# Patient Record
Sex: Male | Born: 1950 | Race: White | Hispanic: No | Marital: Married | Smoking: Former smoker
Health system: Southern US, Community
[De-identification: ages and names within clinical notes are randomized; demographics above are authoritative.]

## PROBLEM LIST (undated history)

## (undated) DIAGNOSIS — D500 Iron deficiency anemia secondary to blood loss (chronic): Principal | ICD-10-CM

## (undated) DIAGNOSIS — IMO0002 Reserved for concepts with insufficient information to code with codable children: Secondary | ICD-10-CM

## (undated) DIAGNOSIS — K5790 Diverticulosis of intestine, part unspecified, without perforation or abscess without bleeding: Secondary | ICD-10-CM

## (undated) DIAGNOSIS — R0989 Other specified symptoms and signs involving the circulatory and respiratory systems: Secondary | ICD-10-CM

## (undated) DIAGNOSIS — K648 Other hemorrhoids: Secondary | ICD-10-CM

## (undated) DIAGNOSIS — K219 Gastro-esophageal reflux disease without esophagitis: Secondary | ICD-10-CM

## (undated) DIAGNOSIS — K5521 Angiodysplasia of colon with hemorrhage: Secondary | ICD-10-CM

## (undated) DIAGNOSIS — F419 Anxiety disorder, unspecified: Secondary | ICD-10-CM

## (undated) DIAGNOSIS — F17200 Nicotine dependence, unspecified, uncomplicated: Secondary | ICD-10-CM

## (undated) DIAGNOSIS — I10 Essential (primary) hypertension: Secondary | ICD-10-CM

## (undated) DIAGNOSIS — R0602 Shortness of breath: Secondary | ICD-10-CM

## (undated) DIAGNOSIS — L89159 Pressure ulcer of sacral region, unspecified stage: Secondary | ICD-10-CM

## (undated) DIAGNOSIS — Z5189 Encounter for other specified aftercare: Secondary | ICD-10-CM

## (undated) DIAGNOSIS — R011 Cardiac murmur, unspecified: Secondary | ICD-10-CM

## (undated) DIAGNOSIS — E119 Type 2 diabetes mellitus without complications: Secondary | ICD-10-CM

## (undated) DIAGNOSIS — R1310 Dysphagia, unspecified: Secondary | ICD-10-CM

## (undated) DIAGNOSIS — E785 Hyperlipidemia, unspecified: Secondary | ICD-10-CM

## (undated) DIAGNOSIS — N189 Chronic kidney disease, unspecified: Secondary | ICD-10-CM

## (undated) DIAGNOSIS — I509 Heart failure, unspecified: Secondary | ICD-10-CM

## (undated) DIAGNOSIS — M1990 Unspecified osteoarthritis, unspecified site: Secondary | ICD-10-CM

## (undated) DIAGNOSIS — I499 Cardiac arrhythmia, unspecified: Secondary | ICD-10-CM

## (undated) DIAGNOSIS — J189 Pneumonia, unspecified organism: Secondary | ICD-10-CM

## (undated) DIAGNOSIS — D649 Anemia, unspecified: Secondary | ICD-10-CM

## (undated) DIAGNOSIS — K2970 Gastritis, unspecified, without bleeding: Secondary | ICD-10-CM

## (undated) DIAGNOSIS — R918 Other nonspecific abnormal finding of lung field: Secondary | ICD-10-CM

## (undated) DIAGNOSIS — T451X5A Adverse effect of antineoplastic and immunosuppressive drugs, initial encounter: Principal | ICD-10-CM

## (undated) DIAGNOSIS — N185 Chronic kidney disease, stage 5: Secondary | ICD-10-CM

## (undated) DIAGNOSIS — K429 Umbilical hernia without obstruction or gangrene: Secondary | ICD-10-CM

## (undated) DIAGNOSIS — IMO0001 Reserved for inherently not codable concepts without codable children: Secondary | ICD-10-CM

## (undated) DIAGNOSIS — Z66 Do not resuscitate: Secondary | ICD-10-CM

## (undated) DIAGNOSIS — I739 Peripheral vascular disease, unspecified: Secondary | ICD-10-CM

## (undated) DIAGNOSIS — D6481 Anemia due to antineoplastic chemotherapy: Principal | ICD-10-CM

## (undated) DIAGNOSIS — I6529 Occlusion and stenosis of unspecified carotid artery: Secondary | ICD-10-CM

## (undated) HISTORY — DX: Iron deficiency anemia secondary to blood loss (chronic): D50.0

## (undated) HISTORY — PX: VASCULAR SURGERY: SHX849

## (undated) HISTORY — DX: Other hemorrhoids: K64.8

## (undated) HISTORY — PX: BELOW KNEE LEG AMPUTATION: SUR23

## (undated) HISTORY — DX: Diverticulosis of intestine, part unspecified, without perforation or abscess without bleeding: K57.90

## (undated) HISTORY — DX: Peripheral vascular disease, unspecified: I73.9

## (undated) HISTORY — DX: Do not resuscitate: Z66

## (undated) HISTORY — DX: Heart failure, unspecified: I50.9

## (undated) HISTORY — DX: Other specified symptoms and signs involving the circulatory and respiratory systems: R09.89

## (undated) HISTORY — DX: Cardiac murmur, unspecified: R01.1

## (undated) HISTORY — DX: Gastritis, unspecified, without bleeding: K29.70

## (undated) HISTORY — DX: Essential (primary) hypertension: I10

## (undated) HISTORY — DX: Type 2 diabetes mellitus without complications: E11.9

## (undated) HISTORY — DX: Adverse effect of antineoplastic and immunosuppressive drugs, initial encounter: T45.1X5A

## (undated) HISTORY — DX: Other nonspecific abnormal finding of lung field: R91.8

## (undated) HISTORY — DX: Chronic kidney disease, stage 5: N18.5

## (undated) HISTORY — DX: Anemia due to antineoplastic chemotherapy: D64.81

## (undated) HISTORY — DX: Reserved for concepts with insufficient information to code with codable children: IMO0002

## (undated) HISTORY — DX: Cardiac arrhythmia, unspecified: I49.9

## (undated) HISTORY — DX: Angiodysplasia of colon with hemorrhage: K55.21

## (undated) HISTORY — DX: Nicotine dependence, unspecified, uncomplicated: F17.200

## (undated) HISTORY — DX: Pressure ulcer of sacral region, unspecified stage: L89.159

## (undated) HISTORY — DX: Occlusion and stenosis of unspecified carotid artery: I65.29

## (undated) HISTORY — DX: Hyperlipidemia, unspecified: E78.5

---

## 1997-10-11 ENCOUNTER — Encounter: Admission: RE | Admit: 1997-10-11 | Discharge: 1997-10-11 | Payer: Self-pay | Admitting: Family Medicine

## 1998-09-08 ENCOUNTER — Encounter: Admission: RE | Admit: 1998-09-08 | Discharge: 1998-09-08 | Payer: Self-pay | Admitting: Sports Medicine

## 1998-09-11 ENCOUNTER — Encounter: Admission: RE | Admit: 1998-09-11 | Discharge: 1998-09-11 | Payer: Self-pay | Admitting: Family Medicine

## 1999-04-16 ENCOUNTER — Encounter: Payer: Self-pay | Admitting: Vascular Surgery

## 1999-04-17 ENCOUNTER — Ambulatory Visit: Admission: RE | Admit: 1999-04-17 | Discharge: 1999-04-17 | Payer: Self-pay | Admitting: Vascular Surgery

## 1999-05-08 ENCOUNTER — Ambulatory Visit (HOSPITAL_COMMUNITY): Admission: RE | Admit: 1999-05-08 | Discharge: 1999-05-08 | Payer: Self-pay | Admitting: Gastroenterology

## 2000-03-04 ENCOUNTER — Ambulatory Visit (HOSPITAL_COMMUNITY): Admission: RE | Admit: 2000-03-04 | Discharge: 2000-03-04 | Payer: Self-pay | Admitting: Gastroenterology

## 2000-03-04 ENCOUNTER — Encounter: Payer: Self-pay | Admitting: Gastroenterology

## 2000-03-18 ENCOUNTER — Encounter: Payer: Self-pay | Admitting: Gastroenterology

## 2000-03-18 ENCOUNTER — Ambulatory Visit (HOSPITAL_COMMUNITY): Admission: RE | Admit: 2000-03-18 | Discharge: 2000-03-18 | Payer: Self-pay | Admitting: Gastroenterology

## 2000-03-21 HISTORY — PX: ESOPHAGOGASTRODUODENOSCOPY: SHX1529

## 2000-04-05 ENCOUNTER — Ambulatory Visit (HOSPITAL_COMMUNITY): Admission: RE | Admit: 2000-04-05 | Discharge: 2000-04-05 | Payer: Self-pay | Admitting: Gastroenterology

## 2000-04-05 ENCOUNTER — Encounter (INDEPENDENT_AMBULATORY_CARE_PROVIDER_SITE_OTHER): Payer: Self-pay

## 2001-06-21 HISTORY — PX: OTHER SURGICAL HISTORY: SHX169

## 2001-07-21 ENCOUNTER — Encounter: Payer: Self-pay | Admitting: Vascular Surgery

## 2001-07-25 ENCOUNTER — Inpatient Hospital Stay (HOSPITAL_COMMUNITY): Admission: RE | Admit: 2001-07-25 | Discharge: 2001-07-26 | Payer: Self-pay | Admitting: Vascular Surgery

## 2001-07-25 ENCOUNTER — Encounter (INDEPENDENT_AMBULATORY_CARE_PROVIDER_SITE_OTHER): Payer: Self-pay | Admitting: *Deleted

## 2001-12-26 ENCOUNTER — Inpatient Hospital Stay (HOSPITAL_COMMUNITY): Admission: EM | Admit: 2001-12-26 | Discharge: 2002-01-03 | Payer: Self-pay | Admitting: Emergency Medicine

## 2001-12-26 ENCOUNTER — Encounter: Payer: Self-pay | Admitting: Emergency Medicine

## 2001-12-26 ENCOUNTER — Encounter (INDEPENDENT_AMBULATORY_CARE_PROVIDER_SITE_OTHER): Payer: Self-pay | Admitting: Specialist

## 2001-12-28 ENCOUNTER — Encounter: Payer: Self-pay | Admitting: Internal Medicine

## 2002-01-01 ENCOUNTER — Encounter: Payer: Self-pay | Admitting: Internal Medicine

## 2003-11-05 ENCOUNTER — Ambulatory Visit: Admission: RE | Admit: 2003-11-05 | Discharge: 2003-11-05 | Payer: Self-pay | Admitting: Family Medicine

## 2004-11-19 HISTORY — PX: COLONOSCOPY: SHX174

## 2004-11-20 ENCOUNTER — Ambulatory Visit (HOSPITAL_COMMUNITY): Admission: RE | Admit: 2004-11-20 | Discharge: 2004-11-20 | Payer: Self-pay | Admitting: Gastroenterology

## 2006-05-21 HISTORY — PX: ESOPHAGOGASTRODUODENOSCOPY: SHX1529

## 2006-06-07 ENCOUNTER — Inpatient Hospital Stay (HOSPITAL_COMMUNITY): Admission: EM | Admit: 2006-06-07 | Discharge: 2006-06-10 | Payer: Self-pay | Admitting: Emergency Medicine

## 2006-06-07 ENCOUNTER — Ambulatory Visit: Payer: Self-pay | Admitting: Cardiology

## 2006-06-08 ENCOUNTER — Encounter: Payer: Self-pay | Admitting: Cardiology

## 2006-08-09 ENCOUNTER — Emergency Department (HOSPITAL_COMMUNITY): Admission: EM | Admit: 2006-08-09 | Discharge: 2006-08-09 | Payer: Self-pay | Admitting: Emergency Medicine

## 2006-08-15 ENCOUNTER — Ambulatory Visit: Payer: Self-pay | Admitting: Vascular Surgery

## 2006-09-08 ENCOUNTER — Encounter (INDEPENDENT_AMBULATORY_CARE_PROVIDER_SITE_OTHER): Payer: Self-pay | Admitting: *Deleted

## 2006-09-08 ENCOUNTER — Ambulatory Visit (HOSPITAL_COMMUNITY): Admission: RE | Admit: 2006-09-08 | Discharge: 2006-09-08 | Payer: Self-pay | Admitting: Orthopedic Surgery

## 2007-11-20 DIAGNOSIS — R0989 Other specified symptoms and signs involving the circulatory and respiratory systems: Secondary | ICD-10-CM

## 2007-11-20 DIAGNOSIS — F17200 Nicotine dependence, unspecified, uncomplicated: Secondary | ICD-10-CM | POA: Insufficient documentation

## 2007-11-20 DIAGNOSIS — E785 Hyperlipidemia, unspecified: Secondary | ICD-10-CM | POA: Insufficient documentation

## 2007-11-20 DIAGNOSIS — E119 Type 2 diabetes mellitus without complications: Secondary | ICD-10-CM | POA: Insufficient documentation

## 2007-11-20 DIAGNOSIS — I739 Peripheral vascular disease, unspecified: Secondary | ICD-10-CM

## 2007-11-20 DIAGNOSIS — I10 Essential (primary) hypertension: Secondary | ICD-10-CM | POA: Insufficient documentation

## 2007-11-20 DIAGNOSIS — R011 Cardiac murmur, unspecified: Secondary | ICD-10-CM

## 2008-03-13 ENCOUNTER — Ambulatory Visit (HOSPITAL_COMMUNITY): Admission: RE | Admit: 2008-03-13 | Discharge: 2008-03-13 | Payer: Self-pay | Admitting: Orthopedic Surgery

## 2008-03-13 ENCOUNTER — Encounter (INDEPENDENT_AMBULATORY_CARE_PROVIDER_SITE_OTHER): Payer: Self-pay | Admitting: Orthopedic Surgery

## 2008-03-21 HISTORY — PX: OTHER SURGICAL HISTORY: SHX169

## 2008-04-11 ENCOUNTER — Ambulatory Visit (HOSPITAL_COMMUNITY): Admission: RE | Admit: 2008-04-11 | Discharge: 2008-04-11 | Payer: Self-pay | Admitting: Orthopedic Surgery

## 2008-06-12 ENCOUNTER — Inpatient Hospital Stay (HOSPITAL_COMMUNITY): Admission: RE | Admit: 2008-06-12 | Discharge: 2008-06-13 | Payer: Self-pay | Admitting: Orthopedic Surgery

## 2008-06-21 HISTORY — PX: OTHER SURGICAL HISTORY: SHX169

## 2008-06-28 ENCOUNTER — Emergency Department (HOSPITAL_COMMUNITY): Admission: EM | Admit: 2008-06-28 | Discharge: 2008-06-28 | Payer: Self-pay | Admitting: Emergency Medicine

## 2008-07-12 ENCOUNTER — Inpatient Hospital Stay (HOSPITAL_COMMUNITY): Admission: RE | Admit: 2008-07-12 | Discharge: 2008-07-14 | Payer: Self-pay | Admitting: Orthopedic Surgery

## 2008-07-12 ENCOUNTER — Encounter (INDEPENDENT_AMBULATORY_CARE_PROVIDER_SITE_OTHER): Payer: Self-pay | Admitting: Orthopedic Surgery

## 2008-10-08 ENCOUNTER — Encounter: Admission: RE | Admit: 2008-10-08 | Discharge: 2008-11-19 | Payer: Self-pay | Admitting: Orthopedic Surgery

## 2010-01-09 ENCOUNTER — Encounter (INDEPENDENT_AMBULATORY_CARE_PROVIDER_SITE_OTHER): Payer: Self-pay | Admitting: *Deleted

## 2010-02-17 ENCOUNTER — Encounter (INDEPENDENT_AMBULATORY_CARE_PROVIDER_SITE_OTHER): Payer: Self-pay | Admitting: *Deleted

## 2010-02-19 ENCOUNTER — Ambulatory Visit: Payer: Self-pay | Admitting: Gastroenterology

## 2010-02-19 ENCOUNTER — Encounter (INDEPENDENT_AMBULATORY_CARE_PROVIDER_SITE_OTHER): Payer: Self-pay

## 2010-03-04 ENCOUNTER — Ambulatory Visit: Payer: Self-pay | Admitting: Gastroenterology

## 2010-03-04 DIAGNOSIS — K5790 Diverticulosis of intestine, part unspecified, without perforation or abscess without bleeding: Secondary | ICD-10-CM

## 2010-03-04 HISTORY — PX: COLONOSCOPY: SHX174

## 2010-03-04 HISTORY — DX: Diverticulosis of intestine, part unspecified, without perforation or abscess without bleeding: K57.90

## 2010-03-07 ENCOUNTER — Emergency Department (HOSPITAL_COMMUNITY): Admission: EM | Admit: 2010-03-07 | Discharge: 2010-03-07 | Payer: Self-pay | Admitting: Emergency Medicine

## 2010-07-12 ENCOUNTER — Encounter: Payer: Self-pay | Admitting: Family Medicine

## 2010-07-23 NOTE — Letter (Signed)
Summary: [facility] Instructions  [facility]  [address] [place], [place] [address]   Phone: [phone]  Fax: [phone]       DAGMAWI VENABLE    1951-03-30    MRN: [id number]        Procedure Day Dorna Bloom:  [facility]  03/04/10     Arrival Time:  7:30AM     Procedure Time:  8:30AM     Location of Procedure:                    _X _  [facility] (4th Floor)                       PREPARATION FOR COLONOSCOPY WITH MOVIPREP   Starting 5 days prior to your procedure 02/27/10 do not eat nuts, seeds, popcorn, corn, beans, peas,  salads, or any raw vegetables.  Do not take any fiber supplements (e.g. Metamucil, Citrucel, and Benefiber).  THE DAY BEFORE YOUR PROCEDURE         DATE: 03/03/10  DAY: TUESDAY  1.  Drink clear liquids the entire day-NO SOLID FOOD  2.  Do not drink anything colored red or purple.  Avoid juices with pulp.  No orange juice.  3.  Drink at least 64 oz. (8 glasses) of fluid/clear liquids during the day to prevent dehydration and help the prep work efficiently.  CLEAR LIQUIDS INCLUDE: Water Jello Ice Popsicles Tea (sugar ok, no milk/cream) Powdered fruit flavored drinks Coffee (sugar ok, no milk/cream) Gatorade Juice: apple, white grape, white cranberry  Lemonade Clear bullion, consomm, broth Carbonated beverages (any kind) Strained chicken noodle soup Hard Candy                             4.  In the morning, mix first dose of MoviPrep solution:    Empty 1 Pouch A and 1 Pouch B into the disposable container    Add lukewarm drinking water to the top line of the container. Mix to dissolve    Refrigerate (mixed solution should be used within 24 hrs)  5.  Begin drinking the prep at 5:00 p.m. The MoviPrep container is divided by 4 marks.   Every 15 minutes drink the solution down to the next mark (approximately 8 oz) until the full liter is complete.   6.  Follow completed prep with 16 oz of clear liquid of your choice  (Nothing red or purple).  Continue to drink clear liquids until bedtime.  7.  Before going to bed, mix second dose of MoviPrep solution:    Empty 1 Pouch A and 1 Pouch B into the disposable container    Add lukewarm drinking water to the top line of the container. Mix to dissolve    Refrigerate  THE DAY OF YOUR PROCEDURE      DATE: 03/04/10  DAY: WEDNESDAY  Beginning at 3:30AM (5 hours before procedure):         1. Every 15 minutes, drink the solution down to the next mark (approx 8 oz) until the full liter is complete.  2. Follow completed prep with 16 oz. of clear liquid of your choice.    3. You may drink clear liquids until 6:30AM (2 HOURS BEFORE PROCEDURE).   MEDICATION INSTRUCTIONS  Unless otherwise instructed, you should take regular prescription medications with a small sip of water   as early as possible the morning of  your procedure.  Diabetic patients - see separate instructions.         Additional medication instructions: _         OTHER INSTRUCTIONS  You will need a responsible adult at least 61 years of age to accompany you and drive you home.   This person must remain in the waiting room during your procedure.  Wear loose fitting clothing that is easily removed.  Leave jewelry and other valuables at home.  However, you may wish to bring a book to read or  an iPod/MP3 player to listen to music as you wait for your procedure to start.  Remove all body piercing jewelry and leave at home.  Total time from sign-in until discharge is approximately 2-3 hours.  You should go home directly after your procedure and rest.  You can resume normal activities the  day after your procedure.  The day of your procedure you should not:   Drive   Make legal decisions   Operate machinery   Drink alcohol   Return to work  You will receive specific instructions about eating, activities and medications before you leave.    The above instructions have been  reviewed and explained to me by   Doristine Church RN II  February 19, 2010 11:14 AM   I fully understand and can verbalize these instructions _____________________________ Date _________

## 2010-07-23 NOTE — Procedures (Signed)
Summary: Colonoscopy  Patient: Jamaine Quintin Note: All result statuses are Final unless otherwise noted.  Tests: (1) Colonoscopy (COL)   COL Colonoscopy           DONE      [facility]     [address]     [place], [place]  [address]           COLONOSCOPY PROCEDURE REPORT           PATIENT:  Gray, Doering  MR#:  [id number]     BIRTHDATE:  09-15-1950, 58 yrs. old  GENDER:  male     ENDOSCOPIST:  Vania Rea. Jarold Motto, MD, [facility]     REF. BY:  Belva Agee, NP     PROCEDURE DATE:  03/04/2010     PROCEDURE:  Higher-risk screening colonoscopy G0105           ASA CLASS:  Class II     INDICATIONS:  Elevated Risk Screening     MEDICATIONS:   Fentanyl 50 mcg IV, Versed 4 mg IV           DESCRIPTION OF PROCEDURE:   After the risks benefits and     alternatives of the procedure were thoroughly explained, informed     consent was obtained.  Digital rectal exam was performed and     revealed no abnormalities.   The LB CF-H180AL [id number] endoscope     was introduced through the anus and advanced to the cecum, which     was identified by both the appendix and ileocecal valve, without     limitations.  The quality of the prep was excellent, using     MoviPrep.  The instrument was then slowly withdrawn as the colon     was fully examined.     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 see     pictures  Scattered diverticula were found in the sigmoid colon.     Internal hemorrhoids were found in the rectum.   Retroflexed views     in the rectum revealed no abnormalities.    The scope was then     withdrawn from the patient and the procedure completed.           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     REPEAT EXAM:  No           ______________________________     Vania Rea. Jarold Motto, MD, [facility]           CC:           n.     eSIGNED:   Vania Rea. Patterson at 03/04/2010 08:59 AM           Gadsby, Jerline Pain, [id number]  Note: An exclamation mark (!) indicates a result that was not dispersed into the flowsheet. Document Creation Date: 03/04/2010 8:59 AM _______________________________________________________________________  (1) Order result status: Final Collection or observation date-time: 03/04/2010 08:52  Requested date-time:  Receipt date-time:  Reported date-time:  Referring Physician:   Ordering Physician: Sheryn Bison [phone]) Specimen Source:  Source: Launa Grill Order Number: [id number] Lab site:   Appended Document: Colonoscopy    Clinical Lists Changes  Observations: Added new observation of COLONNXTDUE: 02/2020 (03/04/2010 13:45)

## 2010-07-23 NOTE — Miscellaneous (Signed)
Summary: [facility] previsit  Clinical Lists Changes  Medications: Added new medication of MOVIPREP 100 GM  SOLR (PEG-KCL-NACL-NASULF-NA ASC-C) As per prep instructions. - Signed Rx of MOVIPREP 100 GM  SOLR (PEG-KCL-NACL-NASULF-NA ASC-C) As per prep instructions.;  #1 x 0;  Signed;  Entered by: Doristine Church RN II;  Authorized by: Mardella Layman MD [facility];  Method used: [facility]  [place] [address], [place], [place]  [address], Ph: [phone] or [phone], Fax: [phone] Observations: Added new observation of ALLERGY REV: Done (02/19/2010 10:48)    Prescriptions: MOVIPREP 100 GM  SOLR (PEG-KCL-NACL-NASULF-NA ASC-C) As per prep instructions.  #1 x 0   Entered by:   Doristine Church RN II   Authorized by:   Mardella Layman MD [facility]   Signed by:   Doristine Church RN II on 02/19/2010   Method used:   [facility]ronically to        [facility]  [place] [address] (retail)       [address]       [place], [place]  [address]       Ph: [phone] or [phone]       Fax: [phone]   RxID:   [id number]

## 2010-07-23 NOTE — Letter (Signed)
Summary: Previsit letter  [facility]  [address] [place], [place] [address]   Phone: [phone]  Fax: [phone]       01/09/2010 MRN: [id number]  [name] [address] [place], [place]  [address]  Dear [name],  Welcome to the Gastroenterology Division at [facility].    You are scheduled to see a nurse for your pre-procedure visit on 02/19/2010 at 11:00AM on the 3rd floor at [facility], [address]  We ask that you try to arrive at our office 15 minutes prior to your appointment time to allow for check-in.  Your nurse visit will consist of discussing your medical and surgical history, your immediate family medical history, and your medications.    Please bring a complete list of all your medications or, if you prefer, bring the medication bottles and we will list them.  We will need to be aware of both prescribed and over the counter drugs.  We will need to know exact dosage information as well.  If you are on blood thinners (Coumadin, Plavix, Aggrenox, Ticlid, etc.) please call our office today/prior to your appointment, as we need to consult with your physician about holding your medication.   Please be prepared to read and sign documents such as consent forms, a financial agreement, and acknowledgement forms.  If necessary, and with your consent, a friend or relative is welcome to sit-in on the nurse visit with you.  Please bring your insurance card so that we may make a copy of it.  If your insurance requires a referral to see a specialist, please bring your referral form from your primary care physician.  No co-pay is required for this nurse visit.     If you cannot keep your appointment, please call [phone] to cancel or reschedule prior to your appointment date.  This allows [place] the opportunity to schedule an appointment for another patient in need of care.    Thank you for choosing [facility] for your medical  needs.  We appreciate the opportunity to care for you.  Please visit [place] at our website  to learn more about our practice.                     Sincerely.                                                                                                                   The Gastroenterology Division

## 2010-07-23 NOTE — Procedures (Signed)
Summary: Colonoscopy  Patient: [name] Note: All result statuses are Final unless otherwise noted.  Tests: (1) Colonoscopy (COL)   COL Colonoscopy           DONE (C)     [facility]     [address]     [place], [place]  [address]           COLONOSCOPY PROCEDURE REPORT           PATIENT:  [name], [name]  MR#:  [id number]     BIRTHDATE:  10/15/1950, 58 yrs. old  GENDER:  male     ENDOSCOPIST:  Vania Rea. Jarold Motto, MD, [facility]     REF. BY:  Belva Agee, NP     PROCEDURE DATE:  03/04/2010     PROCEDURE:  Higher-risk screening colonoscopy G0105           ASA CLASS:  Class II     INDICATIONS:  Elevated Risk Screening     MEDICATIONS:   Fentanyl 50 mcg IV, Versed 4 mg IV           DESCRIPTION OF PROCEDURE:   After the risks benefits and     alternatives of the procedure were thoroughly explained, informed     consent was obtained.  Digital rectal exam was performed and     revealed no abnormalities.   The LB CF-H180AL [id number] endoscope     was introduced through the anus and advanced to the cecum, which     was identified by both the appendix and ileocecal valve, without     limitations.  The quality of the prep was excellent, using     MoviPrep.  The instrument was then slowly withdrawn as the colon     was fully examined.     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 see     pictures  Scattered diverticula were found in the sigmoid colon.     Internal hemorrhoids were found in the rectum.   Retroflexed views     in the rectum revealed no abnormalities.    The scope was then     withdrawn from the patient and the procedure completed.           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     REPEAT EXAM:  No           ______________________________     Vania Rea. Jarold Motto, MD, [facility]           CC:           n.     REVISED:  03/11/2010 02:04 PM     eSIGNED:   Vania Rea. Desirre Eickhoff at 03/11/2010 02:04 PM           Carol, Jerline Pain, [id number]  Note: An exclamation mark (!) indicates a result that was not dispersed into the flowsheet. Document Creation Date: 03/11/2010 2:05 PM _______________________________________________________________________  (1)  Order result status: Final Collection or observation date-time: 03/04/2010 08:52 Requested date-time:  Receipt date-time:  Reported date-time:  Referring Physician:   Ordering Physician: Sheryn Bison [phone]) Specimen Source:  Source: Launa Grill Order Number: [id number] Lab site:

## 2010-07-23 NOTE — Letter (Signed)
Summary: Diabetic Instructions  [facility]  [address] [place], [place] [address]   Phone: [phone]  Fax: [phone]    [name] 07-Sep-1950 MRN: [id number]   _  _   ORAL DIABETIC MEDICATION INSTRUCTIONS  The day before your procedure:   Take your diabetic pill as you do normally  The day of your procedure:   Do not take your diabetic pill    We will check your blood sugar levels during the admission process and again in Recovery before discharging you home  ________________________________________________________________________

## 2010-09-03 LAB — GLUCOSE, CAPILLARY: Glucose-Capillary: 150 mg/dL — ABNORMAL HIGH (ref 70–99)

## 2010-10-05 LAB — GLUCOSE, CAPILLARY
Glucose-Capillary: 119 mg/dL — ABNORMAL HIGH (ref 70–99)
Glucose-Capillary: 124 mg/dL — ABNORMAL HIGH (ref 70–99)
Glucose-Capillary: 124 mg/dL — ABNORMAL HIGH (ref 70–99)
Glucose-Capillary: 139 mg/dL — ABNORMAL HIGH (ref 70–99)
Glucose-Capillary: 181 mg/dL — ABNORMAL HIGH (ref 70–99)
Glucose-Capillary: 191 mg/dL — ABNORMAL HIGH (ref 70–99)
Glucose-Capillary: 234 mg/dL — ABNORMAL HIGH (ref 70–99)

## 2010-10-05 LAB — COMPREHENSIVE METABOLIC PANEL
ALT: 11 U/L (ref 0–53)
AST: 12 U/L (ref 0–37)
CO2: 26 mEq/L (ref 19–32)
Chloride: 97 mEq/L (ref 96–112)
Creatinine, Ser: 1.18 mg/dL (ref 0.4–1.5)
GFR calc Af Amer: >60 mL/min (ref >60)
GFR calc non Af Amer: >60 mL/min (ref >60)
Sodium: 133 mEq/L — ABNORMAL LOW (ref 135–145)
Total Bilirubin: 0.7 mg/dL (ref 0.3–1.2)

## 2010-10-05 LAB — CBC
Hemoglobin: 8 g/dL — ABNORMAL LOW (ref 13.0–17.0)
MCHC: 32 g/dL (ref 30.0–36.0)
MCV: 75.5 fL — ABNORMAL LOW (ref 78.0–100.0)
RBC: 3.23 MIL/uL — ABNORMAL LOW (ref 4.22–5.81)
RBC: 3.33 MIL/uL — ABNORMAL LOW (ref 4.22–5.81)
WBC: 8.4 10*3/uL (ref 4.0–10.5)

## 2010-10-05 LAB — HEMOGLOBIN A1C: Hgb A1c MFr Bld: 7 % — ABNORMAL HIGH (ref 4.6–6.1)

## 2010-10-05 LAB — CROSSMATCH
ABO/RH(D): O POS
Antibody Screen: NEGATIVE

## 2010-10-07 ENCOUNTER — Encounter: Payer: Self-pay | Admitting: Nurse Practitioner

## 2010-10-07 DIAGNOSIS — I2510 Atherosclerotic heart disease of native coronary artery without angina pectoris: Secondary | ICD-10-CM | POA: Insufficient documentation

## 2010-10-07 DIAGNOSIS — D649 Anemia, unspecified: Secondary | ICD-10-CM

## 2010-10-07 DIAGNOSIS — D500 Iron deficiency anemia secondary to blood loss (chronic): Secondary | ICD-10-CM

## 2010-10-07 DIAGNOSIS — Z91199 Patient's noncompliance with other medical treatment and regimen due to unspecified reason: Secondary | ICD-10-CM | POA: Insufficient documentation

## 2010-10-07 DIAGNOSIS — E1142 Type 2 diabetes mellitus with diabetic polyneuropathy: Secondary | ICD-10-CM

## 2010-10-07 DIAGNOSIS — N184 Chronic kidney disease, stage 4 (severe): Secondary | ICD-10-CM | POA: Insufficient documentation

## 2010-10-07 DIAGNOSIS — N185 Chronic kidney disease, stage 5: Secondary | ICD-10-CM

## 2010-10-07 DIAGNOSIS — Z89519 Acquired absence of unspecified leg below knee: Secondary | ICD-10-CM | POA: Insufficient documentation

## 2010-10-07 DIAGNOSIS — Z9119 Patient's noncompliance with other medical treatment and regimen: Secondary | ICD-10-CM

## 2010-10-07 DIAGNOSIS — N189 Chronic kidney disease, unspecified: Secondary | ICD-10-CM

## 2010-10-07 HISTORY — DX: Chronic kidney disease, stage 5: N18.5

## 2010-10-07 HISTORY — DX: Iron deficiency anemia secondary to blood loss (chronic): D50.0

## 2010-11-03 NOTE — Op Note (Signed)
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   PROCEDURE:  Right transmetatarsal amputation.   SURGEON:  Nadara Mustard, MD   ANESTHESIA:  Ankle block.   ESTIMATED BLOOD LOSS:  Minimal.   ANTIBIOTICS:  2 g of Kefzol.   DRAINS:  None.   COMPLICATIONS:  None.   SPECIMENS:  To pathology.   DISPOSITION:  The patient to PACU in stable condition.  Plan for  discharge to the home.   INDICATIONS FOR PROCEDURE:  The patient is a 60 year old gentleman with  diabetic insensate neuropathy.  He is status post multiple amputations  of the right foot and currently only has the third and fourth toes.  The  patient has had chronic ulceration beneath the second metatarsal and  presents at this time for transmetatarsal amputation.  Risks and  benefits of the surgery were discussed including infection,  neurovascular injury, nonhealing of the wound, need for additional  surgery.  The patient states he understands and wished to proceed at  this time.   DESCRIPTION OF THE PROCEDURE:  The patient was brought to OR room#3  after undergoing an ankle block.  After adequate level of anesthesia  obtained, the patient's right lower extremity was prepped using DuraPrep  and draped into a sterile field.  A fishmouth incision was made around  the foot and the chronic ulcer was ellipsed out on the plantar aspect of  his foot.  The patient underwent a transmetatarsal amputation, which  was  beveled plantarly and had a gentle cascade.  There was no prominence  through any bones.  There was no purulent abscess.  Further debridement  of the soft tissue was performed from the ulceration with the ulceration  ellipsed out on 1 block of tissue.  After the amputation and debridement  the ulcer, the patient's wound was irrigated with normal saline.  Hemostasis was obtained.  The incision was closed using 2-0 nylon with  far-near-far suture.  The ulcer was also  closed loosely with 2-0 nylon.  The wound was covered with  Adaptic,  orthopedic sponges, ABD dressing, Kerlix, and Coban.  The patient was  taken to the PACU in stable condition.  Plan for discharge to home.  Prescription for Vicodin for pain.  Follow up in the office in 1 week.      Nadara Mustard, MD  Electronically Signed     MVD/MEDQ  D:  03/13/2008  T:  03/14/2008  Job:  [id number]

## 2010-11-03 NOTE — Op Note (Signed)
[name], [name]              ACCOUNT NO.:  [id number]   MEDICAL RECORD NO.:  [id number]          PATIENT TYPE:  AMB   LOCATION:  SDS                          FACILITY:  [facility]   PHYSICIAN:  Nadara Mustard, MD     DATE OF BIRTH:  [birth year]   DATE OF PROCEDURE:  04/11/2008  DATE OF DISCHARGE:  04/11/2008                               OPERATIVE REPORT   PREOPERATIVE DIAGNOSIS:  Osteomyelitis with Wagner grade III ulceration,  right foot.   POSTOPERATIVE DIAGNOSIS:  Osteomyelitis with Wagner grade III  ulceration, right foot.   PROCEDURE:  Right midfoot amputation.   SURGEON:  Nadara Mustard, MD   ANESTHESIA:  General.   ESTIMATED BLOOD LOSS:  Minimal.   ANTIBIOTICS:  One gram of Kefzol.   DRAINS:  None.   COMPLICATIONS:  None.   TOURNIQUET TIME:  None.   DISPOSITION:  To PACU in stable condition.   INDICATIONS FOR PROCEDURE:  The patient is a 60 year old gentleman who  is status post a right midfoot amputation.  He has ulceration and  breakdown of wound laterally, now with exposed bone.  He has failed  conservative treatment for wound care and presents at this time for  revision amputation.  Risks and benefits were discussed including  persistent infection, neurovascular injury, nonhealing of wound, and  need for higher level amputation.  The patient states he understands and  wishes to proceed at this time.   DESCRIPTION OF PROCEDURE:  The patient was brought to OR room 3 and  underwent a general anesthetic.  After adequate level of anesthesia was  obtained, the patient's right lower extremity was prepped using DuraPrep  and draped into a sterile field.  A fishmouth-type incision was made  over the lateral border of his foot.  The fourth and fifth metatarsals  were resected and beveled at an ankle.  The wound was irrigated with  normal saline.  Hemostasis was obtained.  The incision was closed using  a far-near-near-far suture with 2-0 nylon.  The wound was  covered  Adaptic, orthopedic sponges, ABD dressing, Kerlix, and Coban.  The  patient was extubated, taken to PACU in stable condition, and discharge  to home.  He will continue on his doxycycline.  He has Vicodin for pain,  nonweightbearing on the right, and follow up in the office in 1 week.      Nadara Mustard, MD  Electronically Signed     MVD/MEDQ  D:  04/11/2008  T:  04/12/2008  Job:  [id number]

## 2010-11-03 NOTE — Op Note (Signed)
NAMEBLAINE, HARI              ACCOUNT NO.:  [id number]   MEDICAL RECORD NO.:  [id number]          PATIENT TYPE:  OIB   LOCATION:  5035                         FACILITY:  [facility]   PHYSICIAN:  Nadara Mustard, MD     DATE OF BIRTH:  05-19-1951   DATE OF PROCEDURE:  06/12/2008  DATE OF DISCHARGE:                               OPERATIVE REPORT   PREOPERATIVE DIAGNOSIS:  Abscess and osteomyelitis with draining Wagner  grade 3 ulcer, right foot.   POSTOPERATIVE DIAGNOSIS:  Abscess and osteomyelitis with draining Wagner  grade 3 ulcer, right foot.   PROCEDURE:  Revision of midfoot amputation, right foot.   SURGEON:  Nadara Mustard, MD   ANESTHESIA:  General.   ESTIMATED BLOOD LOSS:  Minimal.   ANTIBIOTICS:  Vancomycin 1 g.   DRAINS:  None.   COMPLICATIONS:  None.   TOURNIQUET TIME:  None.   DISPOSITION:  To PACU in stable condition.   INDICATIONS FOR PROCEDURE:  The patient is a 60 year old gentleman with  diabetic insensate neuropathy, status post midfoot amputation.  The  patient recently has been having episodes of fever and chills despite  p.o. antibiotics.  He still has a purulent draining ulcer which probes  to bone over the fifth metatarsal.  Due to systemic symptoms with a  purulent draining ulcer with osteomyelitis, the patient presents at this  time for revision amputation.  Risks and benefits were discussed  including infection, neurovascular injury, persistent pain, need for  additional surgery, and potential for higher-level amputation.  The  patient states he understands and wished proceed at this time.   DESCRIPTION OF PROCEDURE:  The patient was brought to the OR room 4 and  underwent a general anesthetic.  After adequate level of anesthesia was  obtained, the patient's right lower extremity was prepped using DuraPrep  and draped into a sterile field.  An elliptical incision was made around  the ulcer on the lateral border of the foot.  The midfoot  amputation was  revised and this was revised back to bleeding viable granulation tissue.  There was no evidence of deep abscess or evidence of osteomyelitis after  the debridement.  The wound was irrigated with normal saline.  Electrocautery was used for hemostasis.  The wound was closed without  tension on the skin with a modified vertical mattress suture with 2-0  nylon.  The wound was covered Adaptic, orthopedic sponges, ABD dressing,  Kerlix, and Coban.  The patient was extubated and taken to PACU in  stable condition.  Plan for a second dose of vancomycin tomorrow,  discharge after his vancomycin dose tomorrow, and then he will start  Cipro 500 mg p.o. b.i.d. with follow up in the office.      Nadara Mustard, MD  Electronically Signed     MVD/MEDQ  D:  06/12/2008  T:  06/13/2008  Job:  [id number]

## 2010-11-03 NOTE — Op Note (Signed)
NAMEABLE, MALLOY              ACCOUNT NO.:  [id number]   MEDICAL RECORD NO.:  [id number]          PATIENT TYPE:  INP   LOCATION:  5010                         FACILITY:  [facility]   PHYSICIAN:  Nadara Mustard, MD     DATE OF BIRTH:  [birth year]   DATE OF PROCEDURE:  07/12/2008  DATE OF DISCHARGE:                               OPERATIVE REPORT   PREOPERATIVE DIAGNOSES:  Abscess and osteomyelitis, right foot status  post right foot salvage surgery.   POSTOPERATIVE DIAGNOSES:  Abscess and osteomyelitis, right foot status  post right foot salvage surgery.   PROCEDURE:  Right transtibial amputation.   SURGEON:  Nadara Mustard, MD   ANESTHESIA:  General.   ESTIMATED BLOOD LOSS:  Minimal.   ANTIBIOTICS:  Vancomycin 1 g.   DRAINS:  None.   COMPLICATIONS:  None.   TOURNIQUET TIME:  8 minutes at 300 mmHg at the thigh.   DISPOSITION:  To PACU in stable condition.   INDICATIONS FOR PROCEDURE:  The patient is a 61 year old gentleman with  type 2 diabetes, peripheral vascular disease, smoker who is status post  foot salvage surgery with midfoot amputation.  The patient has been  unable to quit smoking and has been unable to resolve the ulceration and  deep infection despite p.o. antibiotics and aggressive wound care and  presents at this time for transtibial amputation.  Risks and benefits  were discussed including infection, neurovascular injury, persistent  pain, need for additional surgery.  The patient states he understands  and wished to proceed at this time.   DESCRIPTION OF THE PROCEDURE:  The patient was brought to OR room #5 and  underwent a general anesthetic.  After adequate level of anesthesia was  obtained, the patient's right lower extremity was prepped using DuraPrep  and draped into a sterile field and the foot was draped out into an  impervious stockinette.  A transverse incision was made 10 cm distal to  the tibial tubercle.  This curved proximally and a large  posterior flap  was created.  The tibia was transected 1 cm proximal to the skin  incision.  This was beveled anteriorly and the fibula was transected 1  cm proximal to the tibial incision.  A large posterior flap was created  with an amputation knife.  The sciatic nerve was pulled, cut allowed to  retract.  The vascular bundles were suture ligated x3 each.  The  tourniquet was deflated after 8 minutes.  Hemostasis was obtained.  The  deep and superficial fascial layers were closed using  #1 PDS.  The skin was closed using Proximate staples.  The wound was  covered with Adaptic orthopedic sponges, ABD dressing, Webril, and  Coban.  The patient was extubated and taken to PACU in stable condition.  Plan for discharge to home once he is safe with the transfers.      Nadara Mustard, MD  Electronically Signed     MVD/MEDQ  D:  07/12/2008  T:  07/12/2008  Job:  [id number]

## 2010-11-06 NOTE — Op Note (Signed)
NAMECLAYBORNE, DIVIS              ACCOUNT NO.:  [id number]   MEDICAL RECORD NO.:  [id number]          PATIENT TYPE:  OIB   LOCATION:  2550                         FACILITY:  [facility]   PHYSICIAN:  Nadara Mustard, MD     DATE OF BIRTH:  Nov 19, 1950   DATE OF PROCEDURE:  09/08/2006  DATE OF DISCHARGE:                               OPERATIVE REPORT   PREOPERATIVE DIAGNOSIS:  Osteomyelitis right first metatarsal.   POSTOPERATIVE DIAGNOSIS:  Osteomyelitis right first metatarsal.   PROCEDURE:  Right first metatarsal amputation.   SURGEON.:  Nadara Mustard, M.D.   ANESTHESIA:  General.   ESTIMATED BLOOD LOSS:  Minimal.   ANTIBIOTICS:  1 gram of Kefzol.   DRAINS:  None.   COMPLICATIONS:  None.   TOURNIQUET TIME:  None.   DISPOSITION:  To PACU in stable condition.   PLAN:  For discharge to home.   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  The patient has  failed conservative care including wound debridement and p.o.  antibiotics.  He is status post multiple forefeet amputation surgeries  and wished proceed with first metatarsal amputation.  Risks and benefits  were discussed including infection, neurovascular injury, nonhealing  wound, need for higher-level amputation.  The patient states he  understands and wished proceed at this time.   DESCRIPTION OF PROCEDURE:  The patient was brought to OR room four and  underwent general anesthetic.  After adequate level of anesthesia  obtained the patient's right lower extremity was prepped using DuraPrep  and draped into a sterile field.  A medial longitudinal incision was  made which was ellipsed around the draining wound.  This was carried  down to the first metatarsal.  There was chronic osteomyelitic changes  of the metatarsal head.  The metatarsal was resected in one block of  tissue.  This was sent to pathology.  The wound was irrigated with  normal saline.   Hemostasis was obtained.  The wound was closed using  modified vertical mattress suture with 2-0 nylon.  There was no tension  on the skin.  The wound was covered with Adaptic, orthopedic sponges,  ABD dressing,  Webril and Coban.  The patient was extubated, taken to PACU in stable  condition.  The patient had expressed interest to being discharged to  home.  He was given a prescription for Vicodin for pain, ice, elevation  of the right foot, nonweightbearing.  Follow up in the office in 2  weeks.      Nadara Mustard, MD  Electronically Signed     MVD/MEDQ  D:  09/08/2006  T:  09/08/2006  Job:  [id number]

## 2010-11-06 NOTE — Discharge Summary (Signed)
. [facility]  Patient:    [name], [name] Visit Number: [id number] MRN: [id number]          Service Type: SUR Location: 3300 3303 01 Attending Physician:  Alyson Locket Dictated by:   Sherrie George, P.A. Admit Date:  07/25/2001 Discharge Date: 07/26/2001   CC:         Larina Earthly, M.D.  [facility]   Discharge Summary  DATE OF BIRTH: 03/05/1951.  ADMITTING DIAGNOSES: 1. 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 2. Hypertension. 3. Adult onset diabetes mellitus, noninsulin-dependent. 4. History of cardiac arrhythmias. 5. Chronic obstructive pulmonary disease with ongoing tobacco use.  DISCHARGE DIAGNOSES: 1. 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 2. Hypertension. 3. Adult onset diabetes mellitus, noninsulin-dependent. 4. History of cardiac arrhythmias. 5. Chronic obstructive pulmonary disease with ongoing tobacco use.  PROCEDURES:  Bilateral common femoral endarterectomies with Dacron patch angioplasties bilaterally, July 25, 2001 by Dr. Kristen Loader. Early.  BRIEF HISTORY:  The patient is a 60 year old white male who has been followed by Dr. Kristen Loader. Early for a lower extremity arterial insufficiency.  He was seen in December of 2002 when he had a slow healing right third toe amputation at that time.  He complained of bilateral tunnel leg claudication.  His ABIs have been stable at 0.72 on the left and 0.91 on the right.  Arteriograms were performed in October of 2000 which revealed severe stenosis in common femoral arteries bilaterally with moderate iliac stenosis.  He has continued to have significant claudication and is admitted for elective femoral endarterectomies and Dacron patch angioplasties today.  PAST SURGICAL HISTORY:  Toe amputation in 1997 and 1999.  MEDICATIONS: 1. Maxzide 75 mg q.d. 2. Atenolol 100 mg b.i.d. 3. Glucophage  1000 mg b.i.d. 4. Glipizide 5 mg q.d. 5. Accupril 20 mg q.d.  ALLERGIES:  IV DYE causes rash and pruritus.  DISPOSITION:  For further history and physical, please see the dictated note.  HOSPITAL COURSE:  The patient was admitted and taken to the OR on the day of surgery.  He underwent the procedure as described above.  He tolerated the procedure well and returned to the recovery room in 3300 in satisfactory condition.  He was transferred from 3300 and remained hemodynamically stable overnight.  Laboratories first postoperative morning were all within normal range.  Hemoglobin was 9.9, hematocrit 29.9, white count 6.1, platelets 212,000.  BUN 13, creatinine 1.1, glucose 156.  He had good pedal pulses bilaterally.  His IV and Foley were discontinued.  He was mobilized and later he was discharged home.  Smoking cessation was offered but the patient was uninterested.  DISCHARGE MEDICATIONS:  He is to resume his home medications as before.  He is on an aspirin 1 q.d. and Tylox 1-2 p.o. q.4h. p.r.n.  FOLLOW-UP:  He will return on Thursday, August 10, 2001 at 9 a.m. with ankle brachial indices in our office.  CONDITION ON DISCHARGE:  Improved. Dictated by:   Sherrie George, P.A. Attending Physician:  Alyson Locket DD:  08/28/01 TD:  08/30/01 Job: [id number]

## 2010-11-06 NOTE — Op Note (Signed)
[name], [name]              ACCOUNT NO.:  [id number]   MEDICAL RECORD NO.:  [id number]          PATIENT TYPE:  AMB   LOCATION:  ENDO                         FACILITY:  [facility]   PHYSICIAN:  John C. Madilyn Fireman, M.D.    DATE OF BIRTH:  04-Oct-1950   DATE OF PROCEDURE:  11/20/2004  DATE OF DISCHARGE:                                 OPERATIVE REPORT   PROCEDURE:  Colonoscopy.   ENDOSCOPIST:  Everardo All. Madilyn Fireman, M.D.   INDICATION PROCEDURE:  Family history of colon cancer in first-degree  relatives.   PROCEDURE:  The patient was placed in the left lateral decubitus position  and placed on the pulse monitor with continuous low-flow oxygen delivered by  nasal cannula.  He was sedated with 75 mcg IV fentanyl and 7.5 mg IV Versed.  The Olympus video colonoscope was inserted into the rectum and advanced to  cecum, confirmed by transillumination of McBurney's point, visualization of  ileocecal valve and appendiceal orifice.  The prep was good.  The cecum,  ascending, transverse, descending and sigmoid colon all appeared normal with  no masses, polyps, diverticula or other mucosal abnormalities.  The rectum  likewise appeared normal and retroflexed view of the anus revealed no  obvious internal hemorrhoids.  The scope was then withdrawn and the patient  returned to the recovery room in stable condition.  He tolerated the  procedure well and there were no immediate complications.   IMPRESSION:  Normal colonoscopy.   PLAN:  Repeat study in 5 years.      JCH/MEDQ  D:  11/20/2004  T:  11/20/2004  Job:  [id number]   cc:   [name], M.D.  [address] [place]  [place] [address]  Fax: [phone]

## 2010-11-06 NOTE — Op Note (Signed)
[place]  Patient:    [name], [name] Visit Number: [id number] MRN: [id number]          Service Type: MED Location: 5000 5020 01 Attending Physician:  Farley Ly Dictated by:   Alvan Dame, D.P.M. Proc. Date: 01/01/02 Admit Date:  12/26/2001 Discharge Date: 01/03/2002                             Operative Report  PREOPERATIVE DIAGNOSIS:  Nonhealing ulcer with associated osteomyelitis of third metatarsal head of right foot.  POSTOPERATIVE DIAGNOSIS:  Nonhealing ulcer with associated osteomyelitis of third metatarsal head of right foot.  OPERATIVE PROCEDURE:  Excision and debridement of ulcer, lateral aspect of right foot and resection of third metatarsal head osteomyelitic bone with primary closure of operative site and wound.  SURGEON:  Alvan Dame, D.P.M.  INDICATIONS FOR PROCEDURE:  The patient has at least a greater than two month history of open ulceration which has failed to heal on an outpatient basis with p.o. antibiotics.  The patient has been most recently over the past week on an IV antibiotics with some minimal notable improvement.  MRI has verified osteomyelitic changes in the third metatarsal head and generalized abscess and ulceration of the forefoot.  The patient has previously undergone amputation of the hallux and third digit with partial resection of the first metatarsal head and possibly some resection of the third metatarsal head to the full extent previously.  Both clinical and radiographic findings consistent with osteomyelitis.  At this time, nonhealing ulcer, and the patient at this time request intervention by recommendation for resection of osteomyelitic bone with possible attempt at primary closure at this time.  There are no other contraindications for surgery.  ANESTHESIA:  At this time, monitored anesthesia care, minimal IV sedation, local anesthetic administered - total of approximately 20  cc of a 50/50 mixture of 1% Xylocaine with epinephrine, 1:100,000, and 0.25% Marcaine plain infiltrated in a digital regional block fashion to the forefoot.  FINDINGS AND DESCRIPTION OF PROCEDURE:  The patient was brought into the OR and placed on the table in the supine position.  IV sedation was established. Local anesthetic was administered proximal to the operative site plantarly and dorsally on the foot.  At this time, no tourniquet was utilized.  Epinephrine was utilized at the site of injection for hemostasis.  At this time, attention was directed to the plantar aspect of the right foot where an approximately two conversion 6-7 cm elliptical incisions were made encompassing the plantar ulceration site.  The encompassed ellipse of skin, the incision was deepened via sharp and blunt dissection.  I should not that a probe in the ulcer site did probe directly to the third metatarsal head and to the capsule about the third metatarsal head.  At this time, once the probe was placed, dissection was deepened, and abscessed or capsular structure was maintained, resecting the ulcerative site and skin wedge.  The necrotic tissue was divided from the site in toto and en bloc removal of soft tissue ulceration and skin, and underlying soft tissue abscess were resected from the site, and submitted for pathology.  At this time, the third metatarsal head was appreciated into the operative field.  Following dissection through the plantar capsule, the third metatarsal head down actually to the mid-shaft was appreciated, and at this time, utilizing power instrumentation, the osteotomy was carried out approximately mid-shaft in the third metatarsal  from the plantar approach.  The metatarsal head was freed from surrounding capsular structures, soft tissue structures, and removed from the site in toto, and submitted for pathology.  At this time, aerobic and anaerobic cultures were carried out deep  within the operative site near the point of resection to ascertain any bacterial flora  were present within the site.  There was a small amount of bleeding and hemostasis was acquired as necessary via ligation via the electrocautery.  At this time, any necrotic tissue or abnormal capsular tissues were resected. I should note that the second and fourth metatarsal bone joint capsules were intact and not violated in any fashion.  At this time, the site was lavaged with copious amounts of sterile antibiotic solution, and cleared of all osseous and soft tissue debris.  At this time, we decided that with adequate resection of bone, and necrotic tissue and abscess tissue, closure was carried out as follows; 4-0 Vicryl was utilized to reapproximate capsular structures overlying the remaining shaft of the third metatarsal.  At this time, subcutaneous tissue was reapproximated with 4-0 Vicryl in a continuous running fashion.  At this time, a 1/4 inch Penrose drain was placed in the subcutaneous region and exited distal in the wound site.  Final skin closure was carried out utilizing 4-0 nylon in a simple interrupted fashion and the Penrose drain was left exiting the most distal aspect of the incision site.  At this time, the site was dressed with Betadine saline-soaked sponge and then a dry sterile compressive dressing was applied to the right forefoot.  I should note that perfusion to all digits was maintained throughout the procedure, and the patient at this time, was returned from the OR to the recovery room in satisfactory condition.  The patient will be maintained on IV antibiotic therapy.  He has been on Zosyn for the past week and will be discharged from the OR area for anesthesia when stable following postoperative x-rays which were ordered at this time.  The patient will be maintained strict non-weightbearing on the right foot.  The patient will also have some elevation and ice to the  foot as needed.  The patient will also be released back to the service of Dr. Meredith Pel per anesthesia once deemed stable.  Will  follow appropriately and possible changes on antibiotic regimen and treatment will be carried out based on results of cultures that were obtained at this time.  The patient tolerated the procedure and anesthesia well and was comfortable throughout, and he will be followed again in the next two days for appropriate first dressing change. Dictated by:   Alvan Dame, D.P.M. Attending Physician:  Farley Ly DD:  01/12/02 TD:  01/15/02 Job: [id number]

## 2010-11-06 NOTE — Op Note (Signed)
NAMEALEN, MATHESON              ACCOUNT NO.:  [id number]   MEDICAL RECORD NO.:  [id number]          PATIENT TYPE:  INP   LOCATION:  3706                         FACILITY:  [facility]   PHYSICIAN:  John C. Madilyn Fireman, M.D.    DATE OF BIRTH:  [birth year]   DATE OF PROCEDURE:  06/09/2006  DATE OF DISCHARGE:                               OPERATIVE REPORT   PROCEDURE:  Esophagogastroduodenoscopy with laser therapy.   INDICATIONS FOR PROCEDURE:  Recurrent GI bleeding requiring transfusion.  Colonoscopy within the last year or two unrevealing.   PROCEDURE:  The patient was placed in the left lateral decubitus  position and placed on the pulse monitor with continuous low-flow oxygen  delivered by nasal cannula.  She was sedated with 25 mcg IV fentanyl and  5 mg IV Versed.  Olympus video endoscope was advanced under direct  vision into the oropharynx and esophagus.  The esophagus was straight  and of normal caliber at the squamocolumnar line at 38 cm, there is no  visible hiatal hernia, ring stricture or other abnormality of the GE  junction.  The stomach was entered and a small amount of liquid  secretions were suctioned from the fundus.  Retroflexed view of the  cardia was unremarkable.  The fundus appeared normal.  Within the mid  body of the stomach there was a single nonbleeding AVM.  This was  fulgurated using the argon plasma coagulator.  There was mild erythema  and granularity throughout the antrum consistent with mild gastritis  with no focal erosions or ulcers.  The CLO-test was obtained.  The  pylorus was nondeformed and easily allowed passage of the endoscope tip  into the duodenum.  Both bulb and second portion were well inspected and  appeared to be within normal limits.  The scope was then withdrawn and  the patient returned to the recovery room in stable condition.  He being  tolerated the procedure well.  There were no immediate complications.   IMPRESSION:  1. Antral  gastritis.  2. Single gastric AV implant.  Await CLO-test.  Treat for eradication      of Helicobacter if positive.  If he continues to loose blood in the      future, will need capsule endoscopy.           ______________________________  Everardo All. Madilyn Fireman, M.D.     JCH/MEDQ  D:  06/09/2006  T:  06/09/2006  Job:  [id number]

## 2010-11-06 NOTE — H&P (Signed)
[place]. [facility]  Patient:    [name], [name] Visit Number: [id number] MRN: [id number]          Service Type: SUR Location: [facility] 2899 14 Attending Physician:  Alyson Locket Dictated by:   Dominica Severin, P.A. Admit Date:  07/25/2001   CC:         CVTS office             [facility]                         History and Physical  DATE OF BIRTH: 1950-10-26.  CHIEF COMPLAINT:  Bilateral peripheral occlusive disease, bilateral common femoral stenosis with moderate iliac artery stenosis.  HISTORY OF PRESENT ILLNESS:  This is a 60 year old Caucasian male who has been followed by Dr. Kristen Loader. Early for lower extremity arterial insufficiency.  He was seen in December 2002 when the patient had a slow healing right third toe amputation and that time he also complained of bilateral total leg claudication.  He has also had a history of toe infections in the past requiring staged bilateral toe amputations which eventually healed.  He was seen on July 13, 2001 by Dr. Kristen Loader. Early with increasing claudication symptoms, the left greater than the right, but without any foot lesions.  The ABIs at the office showed the left side to be 0.72 and the right to be 0.91, which was unchanged from previous indices.  An angiogram was done back in October 2000 which showed severe stenoses of the common femoral arteries bilaterally with moderate iliac stenosis.  The patient states he does have buttock, hips, thigh, calf, and foot pain as well as rest pain and night pain.  He does not currently have any slow healing ulcers, gangrene, or ischemic changes.  He does describe a decreased temperature. Denies any edema, shortness of breath, dyspnea on exertion, chest pain, or palpitations.  PAST MEDICAL HISTORY:  Peripheral vascular occlusive disease.  Hypertension. Hyperlipidemia.  Type 2 diabetes mellitus x 11 years.  History of poor wound healing.   Heart arrhythmia.  PAST SURGICAL HISTORY:  He had toe amputations both in 1997 and in 1999.  MEDICATIONS: 1. Maxzide 75 mg q.d. 2. Atenolol 100 mg p.o. b.i.d. 3. Glucophage 1000 mg p.o. b.i.d. 4. Glipizide 5 mg p.o. q.d. 5. Accupril 20 mg q.d.  ALLERGIES:  He is allergic to IV DYE which causes a rash and pruritus.  REVIEW OF SYSTEMS:  Please see HPI for significant positives.  Otherwise, the patient denies any kidney disease, asthma, or lung disease.  FAMILY HISTORY:  Mother is deceased at age 61 from diabetes and congestive heart failure.  Father is deceased at 61 from cholangiocarcinoma and congestive heart failure.  Sister is deceased at 61 from lung cancer.  He has another sister who is alive at 61.  He has two brothers who are deceased at 68 and 64-one of brain tumors and the other of colorectal cancer.  SOCIAL HISTORY:  The patient is married and has three children.  He is employed at [employer] in [place].  He drinks approximately a 12-pack of beer a week.  He smokes two packs per day and has done so for 40 years.  PHYSICAL EXAMINATION:  VITAL SIGNS:  Blood pressure is 132/74, pulse is 80, respirations 16.  GENERAL:  He is a 60 year old Caucasian male in no acute distress.  He is alert and oriented x  3.  HEENT:  Head is normocephalic and atraumatic.  Eyes are PERRLA/EOMI without cataracts, glaucoma, or macular degeneration.  The patient does not have any teeth and does not wear dentures.  NECK:  Supple without JVD.  He does have a soft bruit noted on the left.  He does not have any lymphadenopathy.  CHEST:  Symmetrical on inspiration.  LUNGS:  Without wheezes or rhonchi.  He does have some crackles bilaterally at the bases.  There is no lymphadenopathy.  CARDIOVASCULAR:  Regular rate and rhythm without murmurs, rubs, or gallops.  ABDOMEN:  Soft and nontender with positive bowel sounds x 4 quadrants without masses or bruits.  GENITOURINARY:   Deferred.  RECTAL:  Deferred.  EXTREMITIES:  Without clubbing, cyanosis, edema, or ulcerations.  He has dry skin noted bilaterally on his heels and toes.  He has a right first and third toe amputation which is well healed and a left first toe amputation. Temperature is decreased bilaterally.  There are 2+ carotid pulses bilaterally.  There are 2+ femoral pulses bilaterally.  There are 1+ popliteal pulses bilaterally and a 2+ right dorsalis pedis and posterior tibial pulses and a 1+ dorsalis pedis and posterior tibia on the left.  NEUROLOGICAL:  There is no focal deficit.  He has a steady gait.  He has 2+ deep tendon reflexes and muscle strength bilaterally.  ASSESSMENT:  Bilateral common femoral stenosis:  He is to undergo a bilateral common femoral artery endarterectomy and patch angioplasty on July 25, 2001 by Dr. Kristen Loader. Early.  Dr. Kristen Loader. Early has seen and evaluated this patient prior to this admission and has explained the risks and benefits of the procedure and the patient has agreed to continue. Dictated by:   Dominica Severin, P.A. Attending Physician:  Alyson Locket DD:  07/21/01 TD:  07/21/01 Job: [id number]

## 2010-11-06 NOTE — Discharge Summary (Signed)
NAMEHAWLEY, [name]              ACCOUNT NO.:  [id number]   MEDICAL RECORD NO.:  [id number]          PATIENT TYPE:  INP   LOCATION:  3706                         FACILITY:  [facility]   PHYSICIAN:  Michaelyn Barter, M.D. DATE OF BIRTH:  07/10/1950   DATE OF ADMISSION:  06/07/2006  DATE OF DISCHARGE:  06/10/2006                               DISCHARGE SUMMARY   FINAL DIAGNOSES:  1. Severe anemia with melanotic stools.  2. Right foot wound.  3. Hypertension, uncontrolled.  4. Chronic kidney disease.   PROCEDURES:  1. EGD with laser therapy completed on December20,2007.  2. Portable chest x-ray completed on December18,2007.  3. 2-D echocardiogram completed December19,2007.   CONSULTATIONS:  Gastroenterology with Dr. Dorena Cookey   HISTORY OF PRESENT ILLNESS:  Mr. Almquist as a 60 year old gentleman who  indicated that approximately two nights prior to this admission he began  to experience orthopnea.  This required him to sit up in the chair  throughout the course of the night.  He also began to experience  progressive swelling of his leg accompanied by abdominal distension.  He  had developed a cough, particularly at night time.  This was not  accompanied by any fevers.  He went to see his primary care physician on  the date of admission and was found to be anemic and was therefore  referred to the hospital for further evaluation.   HOSPITAL COURSE:  1. Congestive heart failure exacerbation.  The patient was started on      IV Lasix.  His Is and Os were monitored very closely.  A chest x-      ray was completed on December18, 2007, which revealed cardiomegaly      and interstitial edema.  On December19,2007, a 2-D echocardiogram      was completed.  It revealed diastolic dysfunction, overall left      ventricular systolic function was normal, left ventricular EF was      estimated to be 65%.  There was no diagnostic evidence of left      ventricular regional wall motion abnormality.   Over the course of      his hospitalization his breathing improved and by the date of      discharge his shortness of breath had completely resolved.   1. Severe anemia with melanotic stools.  The patient's hemoglobin was      noted to be 6.5 at the time of admission, his hematocrit 20.  He      received 4 units of packed RBCs during this admission.      Gastroenterology was consulted on December20,2007.  The patient      underwent an EGD with laser therapy by Dr. Dorena Cookey.  It revealed      antral gastritis, a single gastric AVM implant was noted.  Dr.      Madilyn Fireman indicated that if the patient had continued to lose blood in      the future he may need a capsule endoscopy.  The patient's      hemoglobin remained relatively stable over the remaining portion of  his hospitalization.   1. Right foot wound.  The patient indicated that he had been seen at      multiple institutions prior to this hospitalization.  He was      followed at the [facility].  He was started on empiric IV      vancomycin during the course of this hospitalization.  I contacted      the patient's podiatrist, Dr. Ralene Cork.  He indicated that the      patient had been scheduled to see Dr. Arbie Cookey for vascular workup.      The wound care nurse had previously indicated that the patient's      pulses were difficult to palpate and the patient had himself      relayed a history of difficulty with regards to his foot wound      healing.  Therefore, a vascular consult was placed.  CVTS saw the      patient on December20,2007.  They indicated that the patient had      normal arterial flow into the foot.  They indicated that there were      normal pulses to the ankle and that wound care as well as      antibiotics should be continued.  No further workup was deemed      necessary.  The patient indicated that he would follow up with his      regular foot doctor once he was discharged from the hospital, and      there  were no additional active issues with regards to the      patient's right foot wound.   1. Hypertension.  The patient's blood pressure was not ideally      controlled.  Attempts were made to do so during the course of this      hospitalization.  The patient indicated that he would follow up      with his primary care physician regarding this.   1. Chronic kidney disease.  The patient's BUN and creatinine were      slightly elevated.  At the time of his admission the patient's BUN      was noted to be 37.  By the day of discharge the patient's BUN was      27.  His creatinine was 1.5.  There were no other active ongoing      issues with regard to his kidneys.   By the date of discharge the patient indicated that he felt much better.  Vital Signs:  His temperature was 97, heart rate 81, respirations 18,  blood pressure 168/82.  The patient's white blood cell count was 9.7,  his hemoglobin 9.9, hematocrit 29.4, platelets 456.  On the date of  discharge his BUN was 27, creatinine 1.5, sodium 135, potassium 4.   The patient was discharged home.  His condition was improved by the date  of discharge.   DISCHARGE MEDICATIONS:  His medications at the time of discharge  consisted of:  1. Lasix 40 mg 1 tablet b.i.d.  2. Glipizide 10 mg p.o. daily.  3. Lisinopril 40 mg 1 tablet p.o. daily.  4. Protonix 40 mg 1 tablet p.o. daily.  5. Norvasc 5 mg 1 tablet p.o. daily.  6. Atenolol 100 mg 1 tablet b.i.d.  7. Ferrous sulfate 325 mg p.o. b.i.d.   The patient was told that he should consider quitting smoking  cigarettes.  He was also told to follow up with his primary care  physician, Dr. Maisie Fus Day, within 2-4 weeks.      Michaelyn Barter, M.D.  Electronically Signed     OR/MEDQ  D:  07/28/2006  T:  07/28/2006  Job:  [id number]

## 2010-11-06 NOTE — Procedures (Signed)
[facility]  Patient:    [name], [name]                     MRN: [id number] Proc. Date: 04/05/00 Adm. Date:  [id number] Attending:  Louie Bun CC:         [name], M.D.                           Procedure Report  PROCEDURE PERFORMED:  Esophagogastroduodenoscopy.  INDICATIONS FOR PROCEDURE:  Persistent left upper quadrant abdominal pain with a CT scan negative.  No response to antipeptic medication.  DESCRIPTION OF PROCEDURE:  The patient was placed in the left lateral decubitus position and placed on the pulse monitor with continuous low-flow oxygen delivered by nasal cannula.  He was sedated with 40 mg of IV Demerol and 5 mg of IV Versed.  The Olympus video endoscope was advanced under direct vision into the oropharynx and esophagus.  The esophagus was straight and of normal caliber at the squamocolumnar line at 38 cm.  There was no hiatal hernia, ring stricture or other abnormality of the GE junction.  The stomach was entered and a small amount of liquid secretions were suctioned from the fundus.  Retroflexed view of the cardia revealed some nodularity of the fundus and proximal body.  Significant to this is unclear.  Biopsies were taken to rule out dysplasia, neoplasia, or Helicobacter.  There were no focal ulcers, erosions or suspicious masses.  The distal body and antrum appeared relatively normal with minimal erythema.  The pylorus is non-deformed and easily allowed passage with the endoscope tip in the duodenum.  Both the bulb and second portion are well inspected and appear to be within normal limits.  The endoscope was then withdrawn and the patient returned to the recovery room in stable condition.  He tolerated the procedure well and there were no immediate complications.  IMPRESSION:  Nodular appearing gastritis of questionable significance, biopsied.  PLAN:  Await biopsy results for now.  Will consider a trial of  an antispasmodic since he has not responded to Nexium if biopsies are negative. DD:  04/05/00 TD:  04/06/00 Job: [id number]

## 2010-11-06 NOTE — Discharge Summary (Signed)
[place]  Patient:    [name], [name] Visit Number: [id number] MRN: [id number]          Service Type: MED Location: 5000 [id number] Attending Physician:  Farley Ly Dictated by:   Rennie Natter, M.D. Admit Date:  12/26/2001 Discharge Date: 01/03/2002   CC:         [name], M.D.  Larina Earthly, M.D.  Alvan Dame, D.P.M.  John C. Madilyn Fireman, M.D.   Discharge Summary  DISCHARGE DIAGNOSES: 1. Osteomyelitis. 2. Diabetes mellitus. 3. Hypertension. 4. Tobacco abuse. 5. Chronic iron-deficiency anemia. 6. Peripheral vascular disease.  DISCHARGE MEDICATIONS:  1. Atenolol 100 mg p.o. q.d.  2. Iron sulfate 325 mg p.o. t.i.d.  3. Glucotrol XL 10 mg p.o. q.d.  4. Lisinopril 20 mg p.o. q.d.  5. Metformin 1000 mg p.o. b.i.d.  6. Metformin 500 mg at lunch time.  7. Augmentin 175 p.o. b.i.d. for one month.  8. Maxzide 75/50 p.o. q.d.  9. Wellbutrin 150 mg p.o. q.d. for smoking cessation.  Stop in a month     if he does not stop smoking. 10. Tylenol two tablets every few hours for pain control.  DISPOSITION:  The patient was discharged in good condition, able to ambulate.  FOLLOW-UP:  He was instructed to follow up with Alvan Dame, D.P.M., on Friday, January 05, 2002, at 3:10 p.m. for follow-up after the osteomyelitis procedure.  He was also instructed to follow up with his primary care physician to establish continuity and let him know about this hospital admission.  PROCEDURE: 1. On January 01, 2002, radiography of the right foot was done which showed    interval retraction of the distal portion of the third metatarsal without    definite fifth complication features.  This was done on January 01, 2002. This    was a repeat radiology. 2. A MRI was done on December 28, 2001, which showed osteomyelitis of the distal    third metatarsal, probable cellulitis around the second metatarsal    phalangeal joint but there is no evidence of  osteomyelitis of the other    side. 3. A lower extremity Doppler of the arteries on December 26, 2001, which showed    ABIs over 1 with normal arterial flow bilaterally. 4. On January 01, 2002, ray refraction was done for osteomyelitis of the    third metatarsal bone with debridement and excision of the ulcer and    the area around it.  CONSULTATIONS: 1. Alvan Dame, D.P.M., podiatry. 2. Larina Earthly, M.D., CVTS. 3. John C. Madilyn Fireman, M.D., gastroenterology.  BRIEF ADMISSION H&P:  This is a 60 year old male with diabetes and long history of hypertension, peripheral vascular disease, history of toe amputations who presented for a two-month-old painless plantar ulcer.  He was seen by his podiatrist and treated with local wound care and p.o. Augmentin and ciprofloxacin but failed to improved.  He denies having any fever or chills and no pain at the site of the ulcer.  PAST MEDICAL HISTORY:  This is significant for bilateral endarterectomies of the femoral arteries done by Dr. Arbie Cookey in February 2003.  Diabetes for 11 years.  Hypertension for 35 years.  History of left big toe amputation, right big toe and right third toe amputations.  ALLERGIES:  He has an allergy to CONTRAST.  FAMILY HISTORY:  This is significant for diabetes in his mother and grandmother.  SOCIAL HISTORY:  He lives [place] his wife and son in [place] and he is unemployed.  He is a current smoker two packs a day for 35 years and drinks six to eight bottles of beer every other day.  PHYSICAL EXAMINATION:  GENERAL APPEARANCE:  The patient was alert and oriented and in no acute distress.  VITAL SIGNS:  Temperature was 100.2, heart rate 95, respiratory rate 18, blood pressure 140/76, 100% on room air.  HEENT:  Pupils are equal, round, reactive to light and accommodation. Extraocular movements intact.  No pharyngeal erythema.  NECK:  No JVD, no thyromegaly.  No carotid bruits.  LUNGS:  Clear to auscultation  bilaterally.  CARDIOVASCULAR:  Regular rate and rhythm, normal S1 and S2.  No murmurs were appreciated.  ABDOMEN:  Soft and nontender, nondistended with bowel sounds present.  EXTREMITIES:  The left foot was status post amputation of the first toe. The right foot was status post amputation of the first and third toes.  On the right foot on the plantar surface, there was 1 to 2 cm round, stage II ulcer with some yellow exudate, nontender, without active discharge from there.  The pulses were present on both feet bilaterally.  NEUROLOGICAL:  The patient alert and oriented. Cranial nerves tested and intact.  Normal strength and sensation. Reflexes present.  ADMISSION LABS:  White blood cell count 5.8, hemoglobin 10.3, hematocrit 32, thrombocytes 257.  Sodium 130, potassium 3.7, chloride 96, bicarb 24, BUN 26, creatinine 1.4, glucose 334.  Wound Gram stain was done which was negative for bacteria and the x-ray of the foot showed high suspicion of osteomyelitis of the third metatarsal.  HOSPITAL COURSE:  #1 - DIABETIC FOOT ULCER AND OSTEOMYELITIS OF THIRD METATARSAL:  The patient was started on IV antibiotic, Zosyn.  He was afebrile and had no system manifestations.  We obtained surgical consult by CVTS and by Dr. Ralene Cork which on, January 01, 2002, performed ray refraction and debridement.  We are continuing the p.o. antibiotics at home with Augment 875 for another month and he is to follow up with his podiatrist.  #2 - DIABETES MELLITUS:  The patient was not following the strict diet.  By the time of admission his hemoglobin A1C was 12.1 with CBGs running above 300. We restarted him on an ADA diet and increased his dosage of Glucotrol and  metformin with current regimen of Glucotrol 10 and metformin three times a day, 1 g in the morning and 1 g in the evening, 500 mg at lunch time.  His CBGs were below 200 and his diabetes looked under control.  #3 - HYPERTENSION:  We achieved  excellent control with his regimen of atenolol and lisinopril and triamterene and hydrochlorothiazide during the course of admission with blood pressure below 140/80.  We were advising him to continue with this.  #4 - PERIPHERAL VASCULAR DISEASE:  We obtained a Doppler of the peripheral arteries which showed normal flow and ABIs above 1.  He is advised to follow with Dr. Arbie Cookey to assess his status post endarterectomy.  #5 - TOBACCO ABUSE:  We repeatedly advised the patient to stop smoking. He agreed and at a certain point, asked for help.  We started Zyban but the patient continued to smoke.  We advised him to try to quit and just to rely on the Zyban for help with the craving.  We discharged him on one month prescription of Zyban with advise to stop using that if he is not able to stop smoking.  #6 - As the patient has hypertension  and diabetes, he will benefit from aspirin at home. We stopped that perioperatively for increased risk of bleeding. We will advise his primary care physician to strongly consider that for future prevention of possible cerebrovascular disease.  LABS AT DISCHARGE:  WBC 6.3, hemoglobin 10.2, hematocrit 33, thrombocytes 298. Sodium 134, potassium 3.9, chloride 100, CO2 26, BUN 16, creatinine 1.3, and glucose 211. Dictated by:   Rennie Natter, M.D. Attending Physician:  Farley Ly DD:  01/05/02 TD:  01/11/02 Job: [id number] [phone]

## 2010-11-06 NOTE — H&P (Signed)
[name], COATES NO.:  [id number]   MEDICAL RECORD NO.:  [id number]          PATIENT TYPE:  EMS   LOCATION:  MAJO                         FACILITY:  [facility]   PHYSICIAN:  Marcellus Scott, MD     DATE OF BIRTH:  22-Aug-1950   DATE OF ADMISSION:  06/07/2006  DATE OF DISCHARGE:                              HISTORY & PHYSICAL   CHIEF COMPLAINT:  Dyspnea and leg swelling.   HISTORY OF PRESENT ILLNESS:  [name] is a 60 year old pleasant  Caucasian male patient with a past medical history as indicated below.  He has been in his usual state of health until two nights ago when he  started experiencing orthopnea when he had to sit up on the chair or  recliner to sleep at night.  He has also noticed worsening of his leg  swelling as well as abdominal distention.  He has coughing at night.  The patient denies any chest pain or palpitations.  He claims he has a  cough with minimal white sputum, but no fever.  He does complain of some  chills.  Following this, the patient sought attention at his primary  medical doctor today who evaluated him and found him to be anemic as  well as with worsened leg swelling and asked him to come to the  emergency room  at [facility] for further evaluation and  management.   PAST MEDICAL HISTORY:  1. Type 2 diabetes for 16 years.  2. Hypertension for 40 years.  3. Chronic kidney disease with a history of acute renal failure      secondary to Bactrim allergy at the end of November 2007.  4. Peripheral vascular disease status post bilateral femoral      endarterectomy.  5. History of anemia, unclear etiology.  The patient has been      transfused 3 units over the two admissions since the last week of      November 2007.   PAST SURGICAL HISTORY:  1. Amputation of the left great toe.  2. Amputation of the right first, second, and third toes which was      done the last week of November 2007, however, the patient has a non-    healing ulcer for which he is seeking attention at the foot center      as indicated above.   The patient denies any history of hypercholesterolemia, MI, coronary  artery disease, cardiac arrhythmias, strokes, or liver problems.   MEDICATIONS:  1. Atenolol 50 mg twice daily.  2. Ciprofloxacin 500 mg twice daily started on Sunday.  3. Glipizide 10 mg p.o. daily.  4. Lasix 40 mg p.o. daily.  5. Lisinopril 20 mg p.o. daily.  6. Triamterene hydrochlorothiazide 75/50, one tablet p.o. daily.  7. Aspirin 325 mg p.o. daily.  8. Iron b.i.d.   ALLERGIES:  1. SULFA.  2. CONTRAST DYE.   FAMILY HISTORY:  1. Mother demise at age 23 of a massive CVA.  2. Sister demise in her 103s of metastatic carcinoma.  3. Brother demise in his 80s of colon cancer.  4.  Brother demise in his 63s of brain cancer.  5. Brother demise in his 68s of CVA.  6. Father demise at age 36 of CHF.   SOCIAL HISTORY:  The patient lives with his spouse at home.  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  The patient has been  smoking for the last 40 years, two packs per day.  No alcohol or  recreational drug use.   ADVANCED DIRECTIVES:  The patient wishes to be full code.   REVIEW OF SYSTEMS:  HEENT:  Patient with occasional headache, no visual  disturbances, no ear ache, no sore throat, no dysphagia.  RESPIRATORY  SYSTEM:  Per history of presenting illness.  CARDIOVASCULAR SYSTEM:  Per  history of presenting illness.  GI/ABDOMINAL SYSTEM:  Patient with  abdominal distention but no abdominal pain, no nausea and vomiting, no  constipation, no diarrhea.  GENITOURINARY SYSTEM:  No frequency,  urgency, dysuria.  MUSCULOSKELETAL SYSTEM:  Without any pain.  EXTREMITIES:  Amputations as  indicated above.  Numbness and decreased  sensation in both feet and both hands.  CENTRAL NERVOUS SYSTEM:  No  asymmetrical limb weakness.  No slurred speech.  SKIN:  Without any  rashes.   PHYSICAL EXAMINATION:  GENERAL:  [name] is a young, obese male  patient who is moderately built in no obvious cardiopulmonary or painful  distress.  Transfusion of one unit of packed red blood cells is in  progress.  VITAL SIGNS:  Temperature 97, pulse 88 per minute with occasional  ectopics, respiratory rate 20 per minute, blood pressure 187/88 which,  on arrival, was 145/73 saturating at 97% on room air.  HEENT: Head is normocephalic, atraumatic.  Pupils bilaterally  symmetrical, equally reactive to light and accommodation.  Extraocular  movements intact.  Mucosa pink and moist.  Anicteric.  Oral cavity  without pharyngeal erythema.  NECK:  Without JVD, carotid bruit, lymphadenopathy, or thyromegaly.  LUNGS:  With crackles in both the bases, no rhonchi, no wheezing, good  breath sounds bilaterally except in the bases were slightly decreased  breath sounds.  HEART:  First and second heart sounds heard, occasional ectopics noted,  no fourth heart sounds, systolic ejection murmur, 2/6, best heard right  upper sternal edge with radiation to the carotids. No rubs, gallops, or  clicks.  ABDOMEN:  Distended with minimal right upper quadrant tenderness but no  rigidity, guarding, or rebound, no organomegaly or mass, bowel sounds  preserved.  Patient with sacral edema.  CNS:  The patient is awake, alert and oriented x3, no cranial nerve  deficits.  EXTREMITIES:  Patient with bilateral lower extremity 2+ edema going all  the way up to the sacrum, bilateral peripheral pulses are well felt . On  the left lower extremity with a healed left great toe amputation.  On  the right side, the patient has a large non-healed ulcer of the first, second, and third toe amputations with  peripheral granulation  tissue  with a small amount of slough.  Patient with minimal redness on the  right shin, but no warmth, no tenderness, no oozing.  SKIN:  Without rashes.   LABORATORY DATA:  CBC with hemoglobin 6.5, hematocrit 20, subsequently  7.1 and 21, white blood cell count 10.5, MCV  77, platelet count 637,  polys 76%.  INR 1.1.  Basic metabolic panel with sodium 130, potassium  5, chloride 102, glucose 95, BUN 37, creatinine has not been done.  Bicarb on venous blood gas 22.  EKG with normal sinus rhythm with a  normal axis.  Otherwise normal EKG.  Chest x-ray has not been done, will  be requested.   ASSESSMENT/PLAN:  Mr. Evrard is a 60 year old male with a history of  long-standing diabetes, long-standing hypertension, chronic kidney  disease, peripheral vascular disease, chronic microcytic anemia with  negative colonoscopy in 2006, multiple amputations bilateral feet with  non-healing ulcer of the right foot with peripheral neuropathy with:  1. Decompensated congestive heart failure.  Will admit the patient to      telemetry.  Will obtain chest x-ray, BNP, and will cycle cardiac      enzymes.  Will place the patient on intravenous Lasix b.i.d. but,      at the same time, monitor basic metabolic panel closely.  I will      continue the patient on aspirin, atenolol, and Lisinopril.  Will      monitor strict input, output, and daily weight.  I will also keep      the patient on oxygen.  Consider cardiology consult with echo      report.  Check the patient's TSH.  2. Microcytic anemia.  Will obtain iron studies, fecal occult blood      for testing.  The patient is to be transfused 2 units packed red      blood cells.  The patient will have to have further evaluation of      the etiology of his microcytic anemia which may well have      precipitated the congestive heart failure.  Will also continue the      patient on iron sulfate. Consider GI consult.  3. Chronic kidney disease.  Will hold the  patient's triamterene      because of the high normal potassium and his hydrochlorothiazide,      however, to monitor the patient's basic metabolic panel closely      while the patient is being diuresed with Lasix.  His chronic kidney      disease may be secondary to diabetic nephropathy as well as      hypertensive nephrosclerosis.  4. Right foot non-healing ulcer.  Will obtain wound consult to do      daily dressings on the wound.  To continue the ciprofloxacin that      has been started and to obtain a wound culture.  5. Diabetes, CBGs to be monitored t.i.d. a.c. and h.s.  To continue      the patient's Glipizide and sliding scale insulin.  Will check the      patient's hemoglobin A1c.  6. Hypertension which, at the moment, seems to be slightly      uncontrolled, will continue current medications and monitor the      patient.  We might have to add the next medication or adjust the      current medication doses. 7. DVT prophylaxis.  Will place the patient on low molecular weight      heparin.  8. GI prophylaxis.  Will place the patient on Protonix.      Marcellus Scott, MD  Electronically Signed     AH/MEDQ  D:  06/07/2006  T:  06/07/2006  Job:  [id number]   cc:   Franklyn Lor, MD  Jonny Ruiz  Morrie Sheldon, M.D.  Alvan Dame, D.P.M.

## 2011-03-22 LAB — COMPREHENSIVE METABOLIC PANEL
ALT: 17
ALT: 17
AST: 19
Albumin: 3.1 — ABNORMAL LOW
Alkaline Phosphatase: 69
Alkaline Phosphatase: 85
BUN: 21
BUN: 22
CO2: 27
CO2: 27
Calcium: 9.3
Chloride: 105
Chloride: 98
Creatinine, Ser: 1.59 — ABNORMAL HIGH
GFR calc Af Amer: 55 — ABNORMAL LOW
GFR calc non Af Amer: 45 — ABNORMAL LOW
Glucose, Bld: 163 — ABNORMAL HIGH
Glucose, Bld: 169 — ABNORMAL HIGH
Potassium: 4.6
Potassium: 5.7 — ABNORMAL HIGH
Sodium: 138
Sodium: 133 — ABNORMAL LOW
Total Bilirubin: 0.7
Total Bilirubin: 0.4
Total Protein: 6.4

## 2011-03-22 LAB — CBC
HCT: 31.1 — ABNORMAL LOW
HCT: 33.7 — ABNORMAL LOW
Hemoglobin: 10.9 — ABNORMAL LOW
MCHC: 32.4
MCV: 80.6
RBC: 3.86 — ABNORMAL LOW
RBC: 4.04 — ABNORMAL LOW
RDW: 21.1 — ABNORMAL HIGH
WBC: 7.6
WBC: 7.6

## 2011-03-22 LAB — APTT: aPTT: 36

## 2011-03-22 LAB — PROTIME-INR
INR: 1.1
INR: 1
Prothrombin Time: 14.5
Prothrombin Time: 13.7

## 2011-03-22 LAB — GLUCOSE, CAPILLARY: Glucose-Capillary: 155 — ABNORMAL HIGH

## 2011-03-25 LAB — GLUCOSE, CAPILLARY
Glucose-Capillary: 118 mg/dL — ABNORMAL HIGH (ref 70–99)
Glucose-Capillary: 130 mg/dL — ABNORMAL HIGH (ref 70–99)
Glucose-Capillary: 137 mg/dL — ABNORMAL HIGH (ref 70–99)
Glucose-Capillary: 191 mg/dL — ABNORMAL HIGH (ref 70–99)

## 2011-03-25 LAB — COMPREHENSIVE METABOLIC PANEL
ALT: 15 U/L (ref 0–53)
AST: 13 U/L (ref 0–37)
CO2: 26 mEq/L (ref 19–32)
Calcium: 8.8 mg/dL (ref 8.4–10.5)
Chloride: 93 mEq/L — ABNORMAL LOW (ref 96–112)
Creatinine, Ser: 1.39 mg/dL (ref 0.4–1.5)
GFR calc Af Amer: >60 mL/min (ref >60)
GFR calc non Af Amer: 52 mL/min — ABNORMAL LOW (ref >60)
Glucose, Bld: 137 mg/dL — ABNORMAL HIGH (ref 70–99)
Sodium: 129 mEq/L — ABNORMAL LOW (ref 135–145)
Total Bilirubin: 0.4 mg/dL (ref 0.3–1.2)

## 2011-03-25 LAB — CBC
HCT: 22 % — ABNORMAL LOW (ref 39.0–52.0)
Hemoglobin: 7.1 g/dL — CL (ref 13.0–17.0)
Hemoglobin: 7.7 g/dL — CL (ref 13.0–17.0)
MCHC: 31.9 g/dL (ref 30.0–36.0)
MCV: 76.5 fL — ABNORMAL LOW (ref 78.0–100.0)
RBC: 2.82 MIL/uL — ABNORMAL LOW (ref 4.22–5.81)
RBC: 3.16 MIL/uL — ABNORMAL LOW (ref 4.22–5.81)
WBC: 8 10*3/uL (ref 4.0–10.5)
WBC: 9.4 10*3/uL (ref 4.0–10.5)

## 2011-03-25 LAB — CROSSMATCH: Antibody Screen: NEGATIVE

## 2011-03-25 LAB — HEMOGLOBIN A1C: Mean Plasma Glucose: 140 mg/dL

## 2011-03-25 LAB — PROTIME-INR: Prothrombin Time: 15.1 seconds (ref 11.6–15.2)

## 2011-11-11 ENCOUNTER — Other Ambulatory Visit: Payer: Self-pay

## 2011-11-11 DIAGNOSIS — R0989 Other specified symptoms and signs involving the circulatory and respiratory systems: Secondary | ICD-10-CM

## 2011-12-16 ENCOUNTER — Encounter: Payer: Self-pay | Admitting: Gastroenterology

## 2011-12-20 ENCOUNTER — Encounter: Payer: Self-pay | Admitting: Vascular Surgery

## 2011-12-21 ENCOUNTER — Encounter: Payer: Self-pay | Admitting: Vascular Surgery

## 2011-12-21 ENCOUNTER — Other Ambulatory Visit: Payer: Self-pay

## 2012-01-03 ENCOUNTER — Encounter: Payer: Self-pay | Admitting: *Deleted

## 2012-01-06 ENCOUNTER — Ambulatory Visit: Payer: Self-pay | Admitting: Gastroenterology

## 2012-01-10 ENCOUNTER — Encounter: Payer: Self-pay | Admitting: Vascular Surgery

## 2012-01-11 ENCOUNTER — Other Ambulatory Visit (INDEPENDENT_AMBULATORY_CARE_PROVIDER_SITE_OTHER): Payer: Medicaid Other | Admitting: *Deleted

## 2012-01-11 ENCOUNTER — Ambulatory Visit (INDEPENDENT_AMBULATORY_CARE_PROVIDER_SITE_OTHER): Payer: Medicaid Other | Admitting: Vascular Surgery

## 2012-01-11 ENCOUNTER — Encounter: Payer: Self-pay | Admitting: Vascular Surgery

## 2012-01-11 VITALS — BP 126/65 | HR 84 | Resp 18 | Ht 66.0 in | Wt 189.6 lb

## 2012-01-11 DIAGNOSIS — I6529 Occlusion and stenosis of unspecified carotid artery: Secondary | ICD-10-CM

## 2012-01-11 DIAGNOSIS — R0989 Other specified symptoms and signs involving the circulatory and respiratory systems: Secondary | ICD-10-CM

## 2012-01-11 NOTE — Progress Notes (Signed)
Patient has today for evaluation of extracranial cerebrovascular occlusive disease. He has not had any stroke amaurosis fugax or transient ischemic attack. He was found to have carotid bruit and underwent duplex revealing a severe left and moderate right carotid stenosis. He has a long history of diabetes and cigarette smoking and hypertension. He denies any cardiac difficulty. 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 He denies any claudication type symptoms on the left.  Past Medical History  Diagnosis Date  . Diverticulosis 03/04/10  . Peripheral vascular disease, unspecified   . Undiagnosed cardiac murmurs   . Tobacco use disorder   . Other symptoms involving cardiovascular system   . Other and unspecified hyperlipidemia   . Unspecified essential hypertension   . Type II or unspecified type diabetes mellitus without mention of complication, not stated as uncontrolled   . Ulcer   . CHF (congestive heart failure)   . Carotid artery occlusion     History  Substance Use Topics  . Smoking status: Current Everyday Smoker -- 2.0 packs/day    Types: Cigarettes  . Smokeless tobacco: Not on file  . Alcohol Use: No    History reviewed. No pertinent family history.  Allergies  Allergen Reactions  . Iodine     IVP dye  . Sulfa Antibiotics     Current outpatient prescriptions:amLODipine (NORVASC) 10 MG tablet, Take 10 mg by mouth daily.  , Disp: , Rfl: ;  aspirin 325 MG tablet, Take 325 mg by mouth daily.  , Disp: , Rfl: ;  atenolol (TENORMIN) 50 MG tablet, Take 50 mg by mouth 2 (two) times daily., Disp: , Rfl: ;  Azilsartan Medoxomil (EDARBI) 80 MG TABS, Take by mouth 1 dose over 46 hours.  , Disp: , Rfl: ;  furosemide (LASIX) 40 MG tablet, Take 40 mg by mouth as needed. , Disp: , Rfl:  glipiZIDE  (GLUCOTROL) 10 MG tablet, Take 10 mg by mouth. One qam, 1/2 qpm , Disp: , Rfl: ;  fenofibrate 160 MG tablet, Take 160 mg by mouth daily.  , Disp: , Rfl: ;  nebivolol (BYSTOLIC) 10 MG tablet, Take 10 mg by mouth daily.  , Disp: , Rfl:   BP 126/65  Pulse 84  Resp 18  Ht 5\' 6"  (1.676 m)  Wt 189 lb 9.6 oz (86.002 kg)  BMI 30.60 kg/m2  Body mass index is 30.60 kg/(m^2).       Review of systems is positive for shortness of breath with exertion swelling in his left leg Neurologic he does report some numbness in weakness in his extremities otherwise negative  Visible exam well-developed well-nourished white male appearing stated age of 2 Carotid arteries reveal harsh bruits bilaterally Chest clear without wheezes bilaterally Heart regular rate and rhythm without murmur Abdomen moderately obese no masses and no tenderness noted Neurologically grossly intact Skin without ulcers or rashes Does have a right below-the-knee education with prosthesis  Drug duplex today reveals severe greater than 80% stenosis in the left internal carotid and 60-79% stenosis on the right  Impression and plan severe stenosis in left internal carotid artery. He is right-handed. I have recommended endarterectomy for reduction of stroke risk. The procedure and potential risk for stroke and cranial nerve injury. He understands and wished to proceed. We'll schedule this at his convenience on 01/24/2012

## 2012-01-12 ENCOUNTER — Encounter (HOSPITAL_COMMUNITY): Payer: Self-pay | Admitting: Respiratory Therapy

## 2012-01-18 ENCOUNTER — Other Ambulatory Visit: Payer: Self-pay

## 2012-01-18 NOTE — Procedures (Unsigned)
CAROTID DUPLEX EXAM  INDICATION:  Bruit.  HISTORY: Diabetes:  Yes Cardiac:  No Hypertension:  Yes Smoking:  Yes Previous Surgery: CV History:  Currently asymptomatic. Amaurosis Fugax No, Paresthesias no , Hemiparesis No                                      RIGHT             LEFT Brachial systolic pressure:         112               128 Brachial Doppler waveforms:         Normal            Normal Vertebral direction of flow:        Antegrade         Antegrade DUPLEX VELOCITIES (cm/sec) CCA peak systolic                   96                127 ECA peak systolic                   341               240 ICA peak systolic                   227               366 ICA end diastolic                   74                125 PLAQUE MORPHOLOGY:                  Mixed             Mixed PLAQUE AMOUNT:                      Moderate          Severe PLAQUE LOCATION:                    ICA/ECA/CCA       ICA/ECA  IMPRESSION:  Doppler velocities suggest a 60-79% stenosis of the right proximal internal carotid artery and an 80-99% stenosis of the left proximal internal carotid artery. Bilateral external carotid artery stenosis noted. Significant increase in the bilateral internal carotid artery Doppler velocities noted when compared to the previous exam on 08/09/2005.  ___________________________________________ Larina Earthly, M.D.  CH/MEDQ  D:  01/14/2012  T:  01/14/2012  Job:  [id number]

## 2012-01-19 ENCOUNTER — Ambulatory Visit (HOSPITAL_COMMUNITY)
Admission: RE | Admit: 2012-01-19 | Discharge: 2012-01-19 | Disposition: A | Discharge status: Home / Self Care | Payer: Medicaid Other | Source: Ambulatory Visit | Attending: Vascular Surgery | Admitting: Vascular Surgery

## 2012-01-19 ENCOUNTER — Encounter (HOSPITAL_COMMUNITY): Payer: Self-pay

## 2012-01-19 ENCOUNTER — Encounter (HOSPITAL_COMMUNITY)
Admission: RE | Admit: 2012-01-19 | Discharge: 2012-01-19 | Disposition: A | Discharge status: Home / Self Care | Payer: Medicaid Other | Source: Ambulatory Visit | Attending: Vascular Surgery | Admitting: Vascular Surgery

## 2012-01-19 DIAGNOSIS — Z01812 Encounter for preprocedural laboratory examination: Secondary | ICD-10-CM | POA: Insufficient documentation

## 2012-01-19 DIAGNOSIS — Z01818 Encounter for other preprocedural examination: Secondary | ICD-10-CM | POA: Insufficient documentation

## 2012-01-19 HISTORY — DX: Umbilical hernia without obstruction or gangrene: K42.9

## 2012-01-19 LAB — URINE MICROSCOPIC-ADD ON

## 2012-01-19 LAB — CBC
MCV: 66 fL — ABNORMAL LOW (ref 78.0–100.0)
Platelets: 236 10*3/uL (ref 150–400)
RBC: 3.26 MIL/uL — ABNORMAL LOW (ref 4.22–5.81)
WBC: 5.6 10*3/uL (ref 4.0–10.5)

## 2012-01-19 LAB — COMPREHENSIVE METABOLIC PANEL
ALT: 12 U/L (ref 0–53)
AST: 14 U/L (ref 0–37)
Alkaline Phosphatase: 78 U/L (ref 39–117)
CO2: 26 mEq/L (ref 19–32)
Chloride: 91 mEq/L — ABNORMAL LOW (ref 96–112)
GFR calc non Af Amer: 51 mL/min — ABNORMAL LOW (ref >90)
Sodium: 127 mEq/L — ABNORMAL LOW (ref 135–145)
Total Bilirubin: 0.2 mg/dL — ABNORMAL LOW (ref 0.3–1.2)

## 2012-01-19 LAB — URINALYSIS, ROUTINE W REFLEX MICROSCOPIC
Bilirubin Urine: NEGATIVE
Hgb urine dipstick: NEGATIVE
Specific Gravity, Urine: 1.011 (ref 1.005–1.030)
pH: 6.5 (ref 5.0–8.0)

## 2012-01-19 LAB — APTT: aPTT: 37 seconds (ref 24–37)

## 2012-01-19 NOTE — Pre-Procedure Instructions (Signed)
20 Sun [name]  01/19/2012   Your procedure is scheduled on:  01/24/12  Monday  Report to [facility] at 0530 AM.  Call this number if you have problems the morning of surgery: [phone]   Remember:   Do not eat food:After Midnight.  May have  liquids:until Midnight .  Clear liquids include soda, tea, black coffee, apple or grape juice, broth.  Take these medicines the morning of surgery with A SIP OF WATER: amlodipine atenolol     Do not wear jewelry, make-up or nail polish.  Do not wear lotions, powders, or perfumes. You may wear deodorant.  Do not shave 48 hours prior to surgery. Men may shave face and neck.  Do not bring valuables to the hospital.  Contacts, dentures or bridgework may not be worn into surgery.  Leave suitcase in the car. After surgery it may be brought to your room.  For patients admitted to the hospital, checkout time is 11:00 AM the day of discharge.   Patients discharged the day of surgery will not be allowed to drive home.  Name and phone number of your driver: Arline Asp --wife  Cell [phone]  Special Instructions: CHG Shower Use Special Wash: 1/2 bottle night before surgery and 1/2 bottle morning of surgery.   Please read over the following fact sheets that you were given: Pain Booklet, Coughing and Deep Breathing, Blood Transfusion Information, Lab Information, MRSA Information and Surgical Site Infection Prevention

## 2012-01-19 NOTE — Progress Notes (Signed)
Vivi Martens at Dr Bosie Helper office to report  Critical lab value of hgb 6.2....she will give this info to dr. Arbie Cookey.

## 2012-01-20 ENCOUNTER — Encounter (HOSPITAL_COMMUNITY): Payer: Self-pay | Admitting: *Deleted

## 2012-01-20 ENCOUNTER — Inpatient Hospital Stay (HOSPITAL_COMMUNITY)
Admission: EM | Admit: 2012-01-20 | Discharge: 2012-01-22 | DRG: 378 | Disposition: A | Discharge status: Home / Self Care | Payer: Medicaid Other | Attending: Internal Medicine | Admitting: Internal Medicine

## 2012-01-20 ENCOUNTER — Telehealth: Payer: Self-pay | Admitting: Vascular Surgery

## 2012-01-20 DIAGNOSIS — K921 Melena: Secondary | ICD-10-CM

## 2012-01-20 DIAGNOSIS — I509 Heart failure, unspecified: Secondary | ICD-10-CM | POA: Diagnosis present

## 2012-01-20 DIAGNOSIS — Z79899 Other long term (current) drug therapy: Secondary | ICD-10-CM

## 2012-01-20 DIAGNOSIS — K2990 Gastroduodenitis, unspecified, without bleeding: Secondary | ICD-10-CM

## 2012-01-20 DIAGNOSIS — I739 Peripheral vascular disease, unspecified: Secondary | ICD-10-CM | POA: Diagnosis present

## 2012-01-20 DIAGNOSIS — K2970 Gastritis, unspecified, without bleeding: Secondary | ICD-10-CM | POA: Diagnosis present

## 2012-01-20 DIAGNOSIS — D62 Acute posthemorrhagic anemia: Secondary | ICD-10-CM

## 2012-01-20 DIAGNOSIS — E871 Hypo-osmolality and hyponatremia: Secondary | ICD-10-CM | POA: Diagnosis present

## 2012-01-20 DIAGNOSIS — I2510 Atherosclerotic heart disease of native coronary artery without angina pectoris: Secondary | ICD-10-CM | POA: Diagnosis present

## 2012-01-20 DIAGNOSIS — F17200 Nicotine dependence, unspecified, uncomplicated: Secondary | ICD-10-CM | POA: Diagnosis present

## 2012-01-20 DIAGNOSIS — Z7982 Long term (current) use of aspirin: Secondary | ICD-10-CM

## 2012-01-20 DIAGNOSIS — Z23 Encounter for immunization: Secondary | ICD-10-CM

## 2012-01-20 DIAGNOSIS — K5521 Angiodysplasia of colon with hemorrhage: Principal | ICD-10-CM | POA: Diagnosis present

## 2012-01-20 DIAGNOSIS — S88119A Complete traumatic amputation at level between knee and ankle, unspecified lower leg, initial encounter: Secondary | ICD-10-CM

## 2012-01-20 DIAGNOSIS — N179 Acute kidney failure, unspecified: Secondary | ICD-10-CM | POA: Diagnosis present

## 2012-01-20 DIAGNOSIS — Z808 Family history of malignant neoplasm of other organs or systems: Secondary | ICD-10-CM

## 2012-01-20 DIAGNOSIS — I10 Essential (primary) hypertension: Secondary | ICD-10-CM | POA: Diagnosis present

## 2012-01-20 DIAGNOSIS — R011 Cardiac murmur, unspecified: Secondary | ICD-10-CM | POA: Diagnosis present

## 2012-01-20 DIAGNOSIS — K922 Gastrointestinal hemorrhage, unspecified: Secondary | ICD-10-CM | POA: Diagnosis present

## 2012-01-20 DIAGNOSIS — I6529 Occlusion and stenosis of unspecified carotid artery: Secondary | ICD-10-CM | POA: Diagnosis present

## 2012-01-20 DIAGNOSIS — Z800 Family history of malignant neoplasm of digestive organs: Secondary | ICD-10-CM

## 2012-01-20 DIAGNOSIS — E119 Type 2 diabetes mellitus without complications: Secondary | ICD-10-CM | POA: Diagnosis present

## 2012-01-20 DIAGNOSIS — K644 Residual hemorrhoidal skin tags: Secondary | ICD-10-CM | POA: Diagnosis present

## 2012-01-20 DIAGNOSIS — D649 Anemia, unspecified: Secondary | ICD-10-CM

## 2012-01-20 DIAGNOSIS — K31819 Angiodysplasia of stomach and duodenum without bleeding: Secondary | ICD-10-CM

## 2012-01-20 HISTORY — DX: Encounter for other specified aftercare: Z51.89

## 2012-01-20 HISTORY — DX: Reserved for inherently not codable concepts without codable children: IMO0001

## 2012-01-20 LAB — COMPREHENSIVE METABOLIC PANEL
ALT: 12 U/L (ref 0–53)
AST: 15 U/L (ref 0–37)
Alkaline Phosphatase: 76 U/L (ref 39–117)
GFR calc Af Amer: 61 mL/min — ABNORMAL LOW (ref >90)
Glucose, Bld: 78 mg/dL (ref 70–99)
Potassium: 3.7 mEq/L (ref 3.5–5.1)
Sodium: 122 mEq/L — ABNORMAL LOW (ref 135–145)
Total Protein: 6.6 g/dL (ref 6.0–8.3)

## 2012-01-20 LAB — PREPARE RBC (CROSSMATCH)

## 2012-01-20 LAB — CBC WITH DIFFERENTIAL/PLATELET
Basophils Absolute: 0 10*3/uL (ref 0.0–0.1)
Basophils Relative: 0 % (ref 0–1)
HCT: 20.4 % — ABNORMAL LOW (ref 39.0–52.0)
Hemoglobin: 6.1 g/dL — CL (ref 13.0–17.0)
Lymphocytes Relative: 8 % — ABNORMAL LOW (ref 12–46)
Monocytes Relative: 7 % (ref 3–12)
Neutro Abs: 5.8 10*3/uL (ref 1.7–7.7)
RDW: 19.7 % — ABNORMAL HIGH (ref 11.5–15.5)
WBC: 7.2 10*3/uL (ref 4.0–10.5)

## 2012-01-20 LAB — ABO/RH: ABO/RH(D): O POS

## 2012-01-20 LAB — OCCULT BLOOD, POC DEVICE: Fecal Occult Bld: POSITIVE

## 2012-01-20 LAB — RETICULOCYTES
RBC.: 3.18 MIL/uL — ABNORMAL LOW (ref 4.22–5.81)
Retic Ct Pct: 2.3 % (ref 0.4–3.1)

## 2012-01-20 MED ORDER — ONDANSETRON HCL 4 MG PO TABS: 4.0000 mg | ORAL_TABLET | Freq: Four times a day (QID) | ORAL | Status: DC | PRN

## 2012-01-20 MED ORDER — INSULIN ASPART 100 UNIT/ML ~~LOC~~ SOLN: 0.0000 [IU] | Freq: Every day | SUBCUTANEOUS | Status: DC

## 2012-01-20 MED ORDER — ATENOLOL 25 MG PO TABS
50.0000 mg | ORAL_TABLET | Freq: Two times a day (BID) | ORAL | Status: DC
  Administered 2012-01-20 – 2012-01-22 (×4): 50 mg via ORAL
  Filled 2012-01-20 (×4): qty 2

## 2012-01-20 MED ORDER — ACETAMINOPHEN 650 MG RE SUPP: 650.0000 mg | Freq: Four times a day (QID) | RECTAL | Status: DC | PRN

## 2012-01-20 MED ORDER — SODIUM CHLORIDE 0.9 % IJ SOLN
3.0000 mL | Freq: Two times a day (BID) | INTRAMUSCULAR | Status: DC
  Administered 2012-01-20 – 2012-01-22 (×4): 3 mL via INTRAVENOUS
  Filled 2012-01-20 (×4): qty 3

## 2012-01-20 MED ORDER — ACETAMINOPHEN 325 MG PO TABS: 650.0000 mg | ORAL_TABLET | Freq: Four times a day (QID) | ORAL | Status: DC | PRN

## 2012-01-20 MED ORDER — SODIUM CHLORIDE 0.9 % IV SOLN
INTRAVENOUS | Status: DC
  Administered 2012-01-21 (×2): via INTRAVENOUS

## 2012-01-20 MED ORDER — ONDANSETRON HCL 4 MG/2ML IJ SOLN: 4.0000 mg | Freq: Four times a day (QID) | INTRAMUSCULAR | Status: DC | PRN

## 2012-01-20 MED ORDER — SODIUM CHLORIDE 0.9 % IV SOLN
Freq: Once | INTRAVENOUS | Status: AC
  Administered 2012-01-20: 1000 mL via INTRAVENOUS

## 2012-01-20 MED ORDER — AMLODIPINE BESYLATE 5 MG PO TABS
10.0000 mg | ORAL_TABLET | Freq: Every day | ORAL | Status: DC
  Administered 2012-01-21 – 2012-01-22 (×2): 10 mg via ORAL
  Filled 2012-01-20 (×2): qty 2

## 2012-01-20 MED ORDER — PNEUMOCOCCAL VAC POLYVALENT 25 MCG/0.5ML IJ INJ
0.5000 mL | INJECTION | INTRAMUSCULAR | Status: AC
  Administered 2012-01-21: 0.5 mL via INTRAMUSCULAR
  Filled 2012-01-20: qty 0.5

## 2012-01-20 MED ORDER — PANTOPRAZOLE SODIUM 40 MG IV SOLR
40.0000 mg | Freq: Two times a day (BID) | INTRAVENOUS | Status: DC
  Administered 2012-01-20 – 2012-01-21 (×2): 40 mg via INTRAVENOUS
  Filled 2012-01-20 (×2): qty 40

## 2012-01-20 MED ORDER — INSULIN ASPART 100 UNIT/ML ~~LOC~~ SOLN
0.0000 [IU] | Freq: Three times a day (TID) | SUBCUTANEOUS | Status: DC
  Administered 2012-01-21: 5 [IU] via SUBCUTANEOUS
  Administered 2012-01-22: 3 [IU] via SUBCUTANEOUS

## 2012-01-20 NOTE — ED Notes (Addendum)
Sent from MD's office due to low hgb, 5.7.  Had planned to have endarectomy ,but canceled due to low hgb, Feels weak.No black stools.

## 2012-01-20 NOTE — H&P (Signed)
[facility] History and Physical  [name] OZH:[id number] DOB: Mar 13, 1951 DOA: 01/20/2012  Referring physician: Dr. Freida Busman PCP: [name], MD   Chief Complaint: abnormal labs  HPI:  This is a 61 year old gentleman with a history of hypertension, diabetes, chronic anemia, carotid stenosis, possible chronic kidney disease, possible CHF. Patient had gone to see a vascular surgeon regarding carotid endarterectomy. Patient was found to have a carotid bruit and subsequently underwent carotid Dopplers. Results revealed stenosis in the left internal carotid artery of 80-99% and on the right side was 60-79%. He had gone to see Dr. early regarding options for carotid endarterectomy. Preoperative workup indicated a significant anemia. Patient was found hemoglobin of less than 6. He was referred to the emergency room by his primary care doctor. Blood work in the emergency room confirmed a hemoglobin of 6.1. Patient reports feeling increasingly tired and weak over the past 2-3 months. He's not had any chest pain, lightheadedness or dizziness. He denies any orthopnea. His main complaint is that he just feels tired. He still able to do his daily activities. He did notice that his stools are somewhat dark this morning. They were found to be quite positive in the emergency room. Outside of today, he describes his stool as being brown and has not noted any significant bleeding he does not take daily NSAIDs, although he does take an aspirin every day. He denies any hematemesis or hematuria. He is less approximately 5 pounds over the last 2 months, but this was intentional. He's not had any unexplained weight loss otherwise. He is a chronic smoker and smokes 1-1/2 packs per day. He he had a colonoscopy by Dr. Jarold Motto in 2011 which revealed AV node formation the cecum, diverticula in the sigmoid colon and internal hemorrhoids. Prior to that he had a upper endoscopy done in 2008 per Dr. 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 The patient reports his by mouth intake has been fair. He was also found to be hyponatremic in the emergency room. He has been referred for admission for workup of anemia hyponatremia  Review of Systems:  Pertinent positives as per history of present illness, otherwise negative  Past Medical History  Diagnosis Date  . Diverticulosis 03/04/10  . Peripheral vascular disease, unspecified   . Undiagnosed cardiac murmurs   . Tobacco use disorder   . Other symptoms involving cardiovascular system   . Other and unspecified hyperlipidemia   . Unspecified essential hypertension   . Type II or unspecified type diabetes mellitus without mention of complication, not stated as uncontrolled   . Ulcer   . CHF (congestive heart failure)   . Carotid artery occlusion   . Umbilical hernia now    has not been repaired  . Blood transfusion    Past Surgical History  Procedure Date  . Colonoscopy 03/04/2010  . 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 Lajoyce Corners  . Bilateral common superficial femoral and profudus artery endarterectomies     2003    Dr. Tawanna Cooler Early   Social History:  reports that he has been smoking Cigarettes.  He has been smoking about 1.5 packs per day. He does not have any smokeless tobacco history on file. He reports that he does not drink alcohol or use illicit drugs. Lives at home independently with his wife  Allergies  Allergen Reactions  . Iodine Hives    IVP dye  . Sulfa  Antibiotics Other (See Comments)    Unknown     History reviewed. No pertinent family history.  Prior to Admission medications   Medication Sig Start Date End Date Taking? Authorizing Provider  amLODipine (NORVASC) 10 MG tablet Take 10 mg by mouth every morning.    Yes Historical Provider, MD  aspirin 325 MG tablet Take 325 mg by mouth every morning.    Yes Historical Provider, MD  atenolol (TENORMIN) 100 MG tablet  Take 50 mg by mouth 2 (two) times daily.   Yes Historical Provider, MD  Azilsartan Medoxomil (EDARBI) 80 MG TABS Take 1 tablet by mouth every morning.    Yes Historical Provider, MD  furosemide (LASIX) 40 MG tablet Take 40 mg by mouth as needed.    Yes Historical Provider, MD  glipiZIDE (GLUCOTROL) 10 MG tablet Take 10 mg by mouth every morning.    Yes Historical Provider, MD   Physical Exam: Filed Vitals:   01/20/12 1550 01/20/12 1552 01/20/12 1810 01/20/12 1830  BP:  132/59 138/64 135/71  Pulse:  85 83 84  Temp:  98.6 F (37 C) 98.3 F (36.8 C) 98.3 F (36.8 C)  TempSrc:  Oral Oral Oral  Resp:  18 16 20   Height: 5\' 3"  (1.6 m)   5\' 5"  (1.651 m)  Weight: 84.823 kg (187 lb)   86 kg (189 lb 9.5 oz)  SpO2:  99% 99% 96%     General:  No acute distress, sitting up in bed appears to be systemically well  Eyes: Pupils are equal round react to light  ENT: No pharyngeal erythema, mucous membranes are moist  Neck: Supple  Cardiovascular: S1, S2, regular rate and rhythm, trace pedal edema in the left lower extremity  Respiratory: Clear to auscultation bilaterally  Abdomen: Soft, obese, nontender, bowel sounds are active  Skin: No visible rashes  Musculoskeletal: Right above-the-knee amputation, otherwise normal  Psychiatric: Normal affect, cooperative with exam  Neurologic: Grossly intact, nonfocal  Labs on Admission:  Basic Metabolic Panel:  Lab 01/20/12 4098 01/19/12 1400  NA 122* 127*  K 3.7 4.1  CL 88* 91*  CO2 24 26  GLUCOSE 78 72  BUN 14 15  CREATININE 1.41* 1.45*  CALCIUM 8.8 9.2  MG -- --  PHOS -- --   Liver Function Tests:  Lab 01/20/12 1606 01/19/12 1400  AST 15 14  ALT 12 12  ALKPHOS 76 78  BILITOT 0.2* 0.2*  PROT 6.6 6.6  ALBUMIN 3.4* 3.4*   No results found for this basename: LIPASE:5,AMYLASE:5 in the last 168 hours No results found for this basename: AMMONIA:5 in the last 168 hours CBC:  Lab 01/20/12 1606 01/19/12 1400  WBC 7.2 5.6    NEUTROABS 5.8 --  HGB 6.1* 6.2*  HCT 20.4* 21.5*  MCV 65.0* 66.0*  PLT 261 236   Cardiac Enzymes: No results found for this basename: CKTOTAL:5,CKMB:5,CKMBINDEX:5,TROPONINI:5 in the last 168 hours  BNP (last 3 results) No results found for this basename: PROBNP:3 in the last 8760 hours CBG: No results found for this basename: GLUCAP:5 in the last 168 hours  Radiological Exams on Admission: Dg Chest 2 View  01/19/2012  *RADIOLOGY REPORT*  Clinical Data: Preadmission  CHEST - 2 VIEW  Comparison: 03/13/2008  Findings: Cardiomediastinal silhouette is stable.  No acute infiltrate or pleural effusion.  No pulmonary edema.  Stable degenerative changes thoracic spine.  IMPRESSION: No active disease.  No significant change.  Original Report Authenticated By: Natasha Mead, M.D.  EKG: Independently reviewed. Her EKG from 7/31 is a normal EKG  Assessment/Plan Principal Problem:  *Acute blood loss anemia Active Problems:  DIABETES MELLITUS  HYPERTENSION  CAD (coronary artery disease)  Amputee, below knee  Occlusion and stenosis of carotid artery without mention of cerebral infarction  GI bleeding  Hyponatremia  Acute renal failure   1. Acute blood loss anemia, likely secondary to GI losses. Patient's MCV is low indicating a chronic blood loss likely from deficiency. 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 This would likely be the cause of his blood loss. We will start him on Protonix and hold his aspirin. We will give him clear liquids. We will request a GI consult and keep him n.p.o. after midnight, in case any procedures will need to be done in the morning. We will also check an anemia panel, and transfuse 2 units of PRBCs. He is hemodynamically stable and will be admitted to a telemetry bed 2. Hyponatremia. Unclear etiology. Could be secondary to volume depletion from Lasix. We will check a urine sodium as well as a serum osmolality. We will give gentle hydration and  recheck sodium in the morning 3. Diabetes. Hold his oral hypoglycemics and use sliding scale insulin 4. Hypertension. continue outpatient medications with the exception of ARB 5. Acute renal failure. Patient has an element of chronic kidney disease although this is not clear. He did have a normal creatinine in 2010. We will hold his ARB and give gentle hydration, follow her urine output and renal function. 6. Carotid stenosis. Plans are for outpatient followup with vascular surgery for elective carotid endarterectomy  Code Status: Full code Family Communication: Discussed with patient and wife at the bedside Disposition Plan: Discharge home once clinically improved  Time spent: 60 minutes  MEMON,JEHANZEB [facility] Pager [phone]  If 7PM-7AM, please contact night-coverage [email or web address] Password [facility] 01/20/2012, 6:37 PM

## 2012-01-20 NOTE — Telephone Encounter (Signed)
Message copied by Fredrich Birks on Thu Jan 20, 2012 10:59 AM ------      Message from: Melene Plan      Created: Wed Jan 19, 2012  4:24 PM       Per Dr Ilean Skill this pts hemoglobin is 6.2. We are cancelling his surgery. He needs to see his PCP Dr Vernon Prey ASAP to address this anemia; then we can reschedule. I have talked to his wife,Cindy and told her we would let her know when the appt is. Her #'s [phone] or cell [phone]. Thanks, Darel Hong

## 2012-01-20 NOTE — Telephone Encounter (Signed)
Spoke with triage @ [facility], patient can be seen by Paulene Floor today, 01/20/12 at 2:15pm. Patient has been notified, dpm

## 2012-01-20 NOTE — ED Provider Notes (Signed)
History  This chart was scribed for Toy Baker, MD by Ladona Ridgel Day. This patient was seen in room APA03/APA03 and the patient's care was started at 1541.   CSN: [id number]  Arrival date & time 01/20/12  1541   First MD Initiated Contact with Patient 01/20/12 1604      Chief Complaint  Patient presents with  . Anemia    The history is provided by the patient. No language interpreter was used.   [name] is a 61 y.o. male who presents to the Emergency Department after being seen at his PCP Dr. Simona Huh and referred here for low blood count with hemoglobin of 5.7. He was planning to have endarectomy surgery Monday but cannot go now because of low blood count. He states increased weakness with exertion/walking. His associated symptoms are dark/tarry stool this AM. He denies any emesis or new pain in his abdomen or elsewhere in the past 24 hours. He states minimal SOB with exertion/walking. He denies any other injuries or illnesses.   Past Medical History  Diagnosis Date  . Diverticulosis 03/04/10  . Peripheral vascular disease, unspecified   . Undiagnosed cardiac murmurs   . Tobacco use disorder   . Other symptoms involving cardiovascular system   . Other and unspecified hyperlipidemia   . Unspecified essential hypertension   . Type II or unspecified type diabetes mellitus without mention of complication, not stated as uncontrolled   . Ulcer   . CHF (congestive heart failure)   . Carotid artery occlusion   . Umbilical hernia now    has not been repaired  . Blood transfusion     Past Surgical History  Procedure Date  . Colonoscopy 03/04/2010  . 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 Lajoyce Corners  . Bilateral common superficial femoral and profudus artery endarterectomies     2003    Dr. Tawanna Cooler Early    History reviewed. No pertinent family history.  History  Substance Use Topics  . Smoking status: Current Everyday Smoker -- 1.5 packs/day    Types: Cigarettes  .  Smokeless tobacco: Not on file  . Alcohol Use: No      Review of Systems A complete 10 system review of systems was obtained and all systems are negative except as noted in the HPI and PMH.   Allergies  Iodine and Sulfa antibiotics  Home Medications   Current Outpatient Rx  Name Route Sig Dispense Refill  . AMLODIPINE BESYLATE 10 MG PO TABS Oral Take 10 mg by mouth daily.      . ASPIRIN 325 MG PO TABS Oral Take 325 mg by mouth daily.      . ATENOLOL 50 MG PO TABS Oral Take 50 mg by mouth 2 (two) times daily.    . AZILSARTAN MEDOXOMIL 80 MG PO TABS Oral Take by mouth 1 dose over 46 hours.      Marland Kitchen EXFORGE 10-320 MG PO TABS      . FUROSEMIDE 40 MG PO TABS Oral Take 40 mg by mouth as needed.     Marland Kitchen GLIPIZIDE 10 MG PO TABS Oral Take 10 mg by mouth every morning.     Marland Kitchen LISINOPRIL-HYDROCHLOROTHIAZIDE 20-25 MG PO TABS        Triage Vitals: BP 132/59  Pulse 85  Temp 98.6 F (37 C) (Oral)  Resp 18  Ht 5\' 3"  (1.6 m)  Wt 187 lb (84.823 kg)  BMI 33.13 kg/m2  SpO2 99%  Physical  Exam  Nursing note and vitals reviewed. Constitutional: He is oriented to person, place, and time. He appears well-developed and well-nourished. No distress.  HENT:  Head: Normocephalic and atraumatic.  Eyes: EOM are normal.       Conjunctival pallor.   Neck: Normal range of motion. Neck supple. No tracheal deviation present.  Cardiovascular: Normal rate, regular rhythm and normal heart sounds.   Pulmonary/Chest: Effort normal and breath sounds normal. No respiratory distress.  Abdominal: Soft. Bowel sounds are normal. He exhibits no distension. There is no tenderness. There is no rebound and no guarding.  Musculoskeletal: Normal range of motion.  Neurological: He is alert and oriented to person, place, and time.  Skin: Skin is warm and dry. There is pallor.  Psychiatric: He has a normal mood and affect. His behavior is normal.    ED Course  Procedures (including critical care time) DIAGNOSTIC  STUDIES: Oxygen Saturation is 99% on room air, normal by my interpretation.    COORDINATION OF CARE: At 418 PM Discussed treatment plan with patient which includes rectal exam, blood work, and plan to admit him to the hospital. Patient agrees.    Labs Reviewed  CBC WITH DIFFERENTIAL  COMPREHENSIVE METABOLIC PANEL  PROTIME-INR  APTT  TYPE AND SCREEN   Dg Chest 2 View  01/19/2012  *RADIOLOGY REPORT*  Clinical Data: Preadmission  CHEST - 2 VIEW  Comparison: 03/13/2008  Findings: Cardiomediastinal silhouette is stable.  No acute infiltrate or pleural effusion.  No pulmonary edema.  Stable degenerative changes thoracic spine.  IMPRESSION: No active disease.  No significant change.  Original Report Authenticated By: Natasha Mead, M.D.     No diagnosis found.    MDM    I personally performed the services described in this documentation, which was scribed in my presence. The recorded information has been reviewed and considered.  5:34 PM Pt to have blood transfusions and pt admitted for eval of gi bleeding       Toy Baker, MD 01/20/12 1734

## 2012-01-20 NOTE — ED Notes (Signed)
CRITICAL VALUE ALERT  Critical value received:  hgb 6.1, hct 20.4  Date of notification:  01/20/2012  Time of notification:  1711  Critical value read back:yes  Nurse who received alert:  Tarri Glenn RN  MD notified (1st page):  Dr Freida Busman  Time of first page:  1712  MD notified (2nd page):  Time of second page:  Responding MD:  Dr Freida Busman   Time MD responded:  [phone]

## 2012-01-21 ENCOUNTER — Encounter (HOSPITAL_COMMUNITY)
Admission: EM | Disposition: A | Discharge status: Home / Self Care | Payer: Self-pay | Source: Home / Self Care | Attending: Internal Medicine

## 2012-01-21 ENCOUNTER — Encounter (HOSPITAL_COMMUNITY): Payer: Self-pay | Admitting: Gastroenterology

## 2012-01-21 DIAGNOSIS — D509 Iron deficiency anemia, unspecified: Secondary | ICD-10-CM

## 2012-01-21 DIAGNOSIS — K921 Melena: Secondary | ICD-10-CM

## 2012-01-21 DIAGNOSIS — R195 Other fecal abnormalities: Secondary | ICD-10-CM

## 2012-01-21 HISTORY — PX: ESOPHAGOGASTRODUODENOSCOPY: SHX5428

## 2012-01-21 HISTORY — PX: ENTEROSCOPY: SHX5533

## 2012-01-21 LAB — BASIC METABOLIC PANEL
CO2: 26 mEq/L (ref 19–32)
Chloride: 99 mEq/L (ref 96–112)
Potassium: 4 mEq/L (ref 3.5–5.1)
Sodium: 132 mEq/L — ABNORMAL LOW (ref 135–145)

## 2012-01-21 LAB — CBC
HCT: 26.5 % — ABNORMAL LOW (ref 39.0–52.0)
Hemoglobin: 8.2 g/dL — ABNORMAL LOW (ref 13.0–17.0)
MCV: 70.9 fL — ABNORMAL LOW (ref 78.0–100.0)
Platelets: 211 10*3/uL (ref 150–400)
RBC: 3.74 MIL/uL — ABNORMAL LOW (ref 4.22–5.81)
WBC: 5.8 10*3/uL (ref 4.0–10.5)

## 2012-01-21 LAB — TSH: TSH: 2.066 u[IU]/mL (ref 0.350–4.500)

## 2012-01-21 LAB — GLUCOSE, CAPILLARY
Glucose-Capillary: 101 mg/dL — ABNORMAL HIGH (ref 70–99)
Glucose-Capillary: 95 mg/dL (ref 70–99)
Glucose-Capillary: 225 mg/dL — ABNORMAL HIGH (ref 70–99)

## 2012-01-21 LAB — VITAMIN B12: Vitamin B-12: 589 pg/mL (ref 211–911)

## 2012-01-21 LAB — FERRITIN: Ferritin: 38 ng/mL (ref 22–322)

## 2012-01-21 LAB — FOLATE: Folate: >20 ng/mL

## 2012-01-21 SURGERY — EGD (ESOPHAGOGASTRODUODENOSCOPY)
Anesthesia: Moderate Sedation

## 2012-01-21 MED ORDER — MEPERIDINE HCL 100 MG/ML IJ SOLN
INTRAMUSCULAR | Status: AC
  Filled 2012-01-21: qty 2

## 2012-01-21 MED ORDER — MIDAZOLAM HCL 5 MG/5ML IJ SOLN
INTRAMUSCULAR | Status: DC | PRN
  Administered 2012-01-21: 1 mg via INTRAVENOUS
  Administered 2012-01-21: 2 mg via INTRAVENOUS

## 2012-01-21 MED ORDER — STERILE WATER FOR IRRIGATION IR SOLN
Status: DC | PRN
  Administered 2012-01-21: 11:00:00

## 2012-01-21 MED ORDER — MIDAZOLAM HCL 5 MG/5ML IJ SOLN
INTRAMUSCULAR | Status: AC
  Filled 2012-01-21: qty 10

## 2012-01-21 MED ORDER — MEPERIDINE HCL 100 MG/ML IJ SOLN
INTRAMUSCULAR | Status: DC | PRN
  Administered 2012-01-21: 50 mg via INTRAVENOUS

## 2012-01-21 MED ORDER — PANTOPRAZOLE SODIUM 40 MG PO TBEC
40.0000 mg | DELAYED_RELEASE_TABLET | Freq: Two times a day (BID) | ORAL | Status: DC
  Administered 2012-01-21 – 2012-01-22 (×2): 40 mg via ORAL
  Filled 2012-01-21 (×2): qty 1

## 2012-01-21 MED ORDER — SODIUM CHLORIDE 0.45 % IV SOLN
INTRAVENOUS | Status: DC
  Administered 2012-01-22: 01:00:00 via INTRAVENOUS

## 2012-01-21 MED ORDER — BISACODYL 5 MG PO TBEC
10.0000 mg | DELAYED_RELEASE_TABLET | ORAL | Status: AC
  Administered 2012-01-21 (×2): 10 mg via ORAL
  Filled 2012-01-21 (×2): qty 2

## 2012-01-21 MED ORDER — PEG 3350-KCL-NABCB-NACL-NASULF 236 G PO SOLR
4000.0000 mL | Freq: Once | ORAL | Status: AC
  Administered 2012-01-21: 4000 mL via ORAL
  Filled 2012-01-21: qty 4000

## 2012-01-21 MED ORDER — BUTAMBEN-TETRACAINE-BENZOCAINE 2-2-14 % EX AERO
INHALATION_SPRAY | CUTANEOUS | Status: DC | PRN
  Administered 2012-01-21: 2 via TOPICAL

## 2012-01-21 NOTE — H&P (Signed)
Primary Care Physician:  [name], MD Primary Gastroenterologist:  Dr. Darrick Penna  Pre-Procedure History & Physical: HPI:  [name] is a 61 y.o. male here for  MELENA/HB 6.   Past Medical History  Diagnosis Date  . Diverticulosis 03/04/10  . Peripheral vascular disease, unspecified   . Undiagnosed cardiac murmurs   . Tobacco use disorder   . Other symptoms involving cardiovascular system   . Other and unspecified hyperlipidemia   . Unspecified essential hypertension   . Type II or unspecified type diabetes mellitus without mention of complication, not stated as uncontrolled   . Ulcer   . CHF (congestive heart failure)   . Carotid artery occlusion   . Umbilical hernia now    has not been repaired  . Blood transfusion     Past Surgical History  Procedure Date  . Colonoscopy 03/04/2010    Dr. Alysia Penna AMV without mention of ablation, scattered diverticula, internal hemorrhoids  . Bilateral common superficial femoral and profudus artery endarterectomies     2003    Dr. Tawanna Cooler Early  . Esophagogastroduodenoscopy 03/2000    Dr. Barbaraann Rondo gastritis, H.Pylori gastritis, ?treated  . Esophagogastroduodenoscopy 05/2006    Dr. Jeanie Sewer recurrent GI bleed. antral gastritis, single gastric AVM s/p APC, CLO results?  . Colonoscopy 11/2004    Dr. Georgeann Oppenheim  . 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 Authorizing Provider  amLODipine (NORVASC) 10 MG tablet Take 10 mg by mouth every morning.    Yes Historical Provider, MD  aspirin 325 MG tablet Take 325 mg by mouth every morning.    Yes Historical Provider, MD  atenolol (TENORMIN) 100 MG tablet Take 50 mg by mouth 2 (two) times daily.   Yes Historical Provider, MD  Azilsartan Medoxomil (EDARBI) 80 MG TABS Take 1 tablet by mouth every morning.    Yes Historical Provider, MD  furosemide (LASIX) 40 MG  tablet Take 40 mg by mouth as needed.    Yes Historical Provider, MD  glipiZIDE (GLUCOTROL) 10 MG tablet Take 10 mg by mouth every morning.    Yes Historical Provider, MD    Allergies as of 01/20/2012 - Review Complete 01/20/2012  Allergen Reaction Noted  . Iodine Hives 10/07/2010  . Sulfa antibiotics Other (See Comments) 10/07/2010    Family History  Problem Relation Age of Onset  . Colon cancer Brother 50    deceased  . Cancer Sister     deceased, three primary cancers: lung, esophageal, lymphoma.   . Cancer Father     gallbladder  . Cancer Brother     unknown primary  . Cancer Sister     unknown primary    History   Social History  . Marital Status: Married    Spouse Name: N/A    Number of Children: N/A  . Years of Education: N/A   Occupational History  . Not on file.   Social History Main Topics  . Smoking status: Current Everyday Smoker -- 1.5 packs/day    Types: Cigarettes  . Smokeless tobacco: Not on file  . Alcohol Use: No  . Drug Use: No  . Sexually Active:    Other Topics Concern  . Not on file   Social History Narrative  . No narrative on file    Review of Systems: See HPI, otherwise negative ROS   Physical Exam: BP 152/68  Pulse 77  Temp  97.9 F (36.6 C) (Oral)  Resp 18  Ht 5\' 5"  (1.651 m)  Wt 193 lb (87.544 kg)  BMI 32.12 kg/m2  SpO2 97% General:   Alert,  pleasant and cooperative in NAD Head:  Normocephalic and atraumatic. Neck:  Supple;  Lungs:  Clear throughout to auscultation.    Heart:  Regular rate and rhythm. Abdomen:  Soft, nontender and nondistended. Normal bowel sounds, without guarding, and without rebound.   Neurologic:  Alert and  oriented x4;  grossly normal neurologically.  Impression/Plan:    MELENA/ANEMIA-HX: GASTRIC AVMs  PLAN: 1. EGD TODAY

## 2012-01-21 NOTE — Op Note (Signed)
[facility] [address] [place], [place]  [address]  OPERATIVE PROCEDURE REPORT  PATIENT:  [name], [name]  MR#:  [id number] BIRTHDATE:  [birth year], 60 yrs. old  GENDER:  male  ENDOSCOPIST:  Jonette Eva, MD ASSISTANT:  PCP: [name], MD  PROCEDURE DATE:  01/21/2012 PROCEDURE:  EGD WITH BIOPSY, Small Bowel Endoscopy ASA CLASS:  INDICATIONS:  FATIGUE/? MELENAx1 YESTERDAY HB 6 ON PREOP LABS. PT ON ASA FOR ASCVD.  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  The EG-[id number] ([id number]) and [id number] ([id number]) endoscope was introduced through the mouth and advanced to the proximal jejunum, without limitations.  The instrument was slowly withdrawn as the mucosa was fully examined. <<PROCEDUREIMAGES>>  An GASTRIC AV malformation was found in the body of the stomach  ABLATED WITH BICAP(25w). LESIONS BLED AFTER CAUTERY APPLIED x1. HEMOSTASIS ACHIEVED. NO OLD BLOOD OR FRESH BLOOD IN THE STOMACH OR SMALL BOWEL. MILD TO MODERATE Gastritis was found & BIOPSIED VIA COLD FORCEPS. NL DUODENUM.   Retroflexed views revealed no abnormalities.    The scope was then withdrawn from the patient and the procedure terminated.  COMPLICATIONS:  None  ENDOSCOPIC IMPRESSION: 1) MILD TO MAODERATE Gastritis 2) SLOW GIB LIKEY DUE TO PT BEING ON ASA ND SUBSEQUENT BLOOD LOSS FROM AVMS IN STOMACH AND COLON AS WELL AS GASTRITIS  RECOMMENDATIONS: CLEAR LIQUID DIET TCS 8/3 AT 0900. WILL ABLATE AVMS IF PRESENT. CONSIDER CAPSULE ENDOSCOPY IF NO LESIONS IDENTIFIED. CONTINUE ASA-BENEFITS OUTWEIGH THE RISKS BID PPI OPV W/ SLF IN 3 MOS  REPEAT EXAM:  No  ______________________________ Jonette Eva, MD  CC:  n. eSIGNED:   Kinnie Kaupp at  01/21/2012 12:38 PM  Vernie Murders, [phone]

## 2012-01-21 NOTE — Progress Notes (Signed)
UR Chart Review Completed  

## 2012-01-21 NOTE — Consult Note (Signed)
REVIEWED.  

## 2012-01-21 NOTE — Consult Note (Signed)
Referring Provider: Erick Blinks, MD Primary Care Physician:  [name], MD Primary Gastroenterologist:  Sheryn Bison, MD  Reason for Consultation:  Profound anemia/heme positive stool  HPI: [name] is a 61 y.o. male presented to ED after preop labs showed Hgb less than 6. He was scheduled for CEA next week but surgery has been cancelled at this point. Patient was directed to ED by PCP yesterday. In ED he was found to be heme positive. His H/H were 6.1/20.4, MCV 65.   Patient complains of 2-3 month h/o worsening fatigue. Denies chest pain, dizziness, lightheadedness. Denies brbpr. C/O intermittent melena, last episode yesterday. Yesterday more loose stool which was black. Then took Pepto. Takes ASA daily but no other NSAIDS. C/O chronic constipation. Takes citracel daily and MOM prn. 4-5 BMs per week but very little. States did not tolerate Miralax due to increased gas. C/O mid-abdominal pain worse with meals. Has upcoming appointment with Dr. Jarold Motto.    He has h/o chronic anemia at least dating back to 2009 per EPIC. Most recent Hgb available prior to this admission showed Hgb 8 in 06/2008. Patient reports that he has received 17 units of blood over the years for anemia. None related to surgeries. "They can't figure out where it is all going." Denies heartburn, dysphagia, unintentional weight loss, anorexia.    Prior to Admission medications   Medication Sig Start Date End Date Taking? Authorizing Provider  amLODipine (NORVASC) 10 MG tablet Take 10 mg by mouth every morning.    Yes Historical Provider, MD  aspirin 325 MG tablet Take 325 mg by mouth every morning.    Yes Historical Provider, MD  atenolol (TENORMIN) 100 MG tablet Take 50 mg by mouth 2 (two) times daily.   Yes Historical Provider, MD  Azilsartan Medoxomil (EDARBI) 80 MG TABS Take 1 tablet by mouth every morning.    Yes Historical Provider, MD  furosemide (LASIX) 40 MG tablet Take 40 mg by mouth as needed.    Yes  Historical Provider, MD  glipiZIDE (GLUCOTROL) 10 MG tablet Take 10 mg by mouth every morning.    Yes Historical Provider, MD    Current Facility-Administered Medications  Medication Dose Route Frequency Provider Last Rate Last Dose  . 0.9 %  sodium chloride infusion   Intravenous Once Toy Baker, MD 125 mL/hr at 01/20/12 1655 1,000 mL at 01/20/12 1655  . 0.9 %  sodium chloride infusion   Intravenous Continuous Erick Blinks, MD 75 mL/hr at 01/21/12 0830    . acetaminophen (TYLENOL) tablet 650 mg  650 mg Oral Q6H PRN Erick Blinks, MD       Or  . acetaminophen (TYLENOL) suppository 650 mg  650 mg Rectal Q6H PRN Erick Blinks, MD      . amLODipine (NORVASC) tablet 10 mg  10 mg Oral Daily Erick Blinks, MD      . atenolol (TENORMIN) tablet 50 mg  50 mg Oral BID Erick Blinks, MD   50 mg at 01/20/12 2154  . insulin aspart (novoLOG) injection 0-15 Units  0-15 Units Subcutaneous TID WC Erick Blinks, MD      . insulin aspart (novoLOG) injection 0-5 Units  0-5 Units Subcutaneous QHS Erick Blinks, MD      . ondansetron (ZOFRAN) tablet 4 mg  4 mg Oral Q6H PRN Erick Blinks, MD       Or  . ondansetron (ZOFRAN) injection 4 mg  4 mg Intravenous Q6H PRN Erick Blinks, MD      .  pantoprazole (PROTONIX) injection 40 mg  40 mg Intravenous Q12H Erick Blinks, MD   40 mg at 01/20/12 2333  . pneumococcal 23 valent vaccine (PNU-IMMUNE) injection 0.5 mL  0.5 mL Intramuscular Tomorrow-1000 Erick Blinks, MD      . sodium chloride 0.9 % injection 3 mL  3 mL Intravenous Q12H Erick Blinks, MD   3 mL at 01/20/12 2333    Allergies as of 01/20/2012 - Review Complete 01/20/2012  Allergen Reaction Noted  . Iodine Hives 10/07/2010  . Sulfa antibiotics Other (See Comments) 10/07/2010    Past Medical History  Diagnosis Date  . Diverticulosis 03/04/10  . Peripheral vascular disease, unspecified   . Undiagnosed cardiac murmurs   . Tobacco use disorder   . Other symptoms involving cardiovascular  system   . Other and unspecified hyperlipidemia   . Unspecified essential hypertension   . Type II or unspecified type diabetes mellitus without mention of complication, not stated as uncontrolled   . Ulcer   . CHF (congestive heart failure)   . Carotid artery occlusion   . Umbilical hernia now    has not been repaired  . Blood transfusion     Past Surgical History  Procedure Date  . Colonoscopy 03/04/2010    Dr. Alysia Penna AMV without mention of ablation, scattered diverticula, internal hemorrhoids  . Bilateral common superficial femoral and profudus artery endarterectomies     2003    Dr. Tawanna Cooler Early  . Esophagogastroduodenoscopy 03/2000    Dr. Barbaraann Rondo gastritis, H.Pylori gastritis, ?treated  . Esophagogastroduodenoscopy 05/2006    Dr. Jeanie Sewer recurrent GI bleed. antral gastritis, single gastric AVM s/p APC, CLO results?  . Colonoscopy 11/2004    Dr. Georgeann Oppenheim  . 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 Colon cancer Brother 50    deceased  . Cancer Sister     deceased, three primary cancers: lung, esophageal, lymphoma.   . Cancer Father     gallbladder  . Cancer Brother     unknown primary  . Cancer Sister     unknown primary    History   Social History  . Marital Status: Married    Spouse Name: N/A    Number of Children: N/A  . Years of Education: N/A   Occupational History  . Not on file.   Social History Main Topics  . Smoking status: Current Everyday Smoker -- 1.5 packs/day    Types: Cigarettes  . Smokeless tobacco: Not on file  . Alcohol Use: No  . Drug Use: No  . Sexually Active:    Other Topics Concern  . Not on file   Social History Narrative  . No narrative on file     ROS:  General: Negative for anorexia, weight loss, fever, chills. C/O  fatigue, weakness. Eyes: Negative for vision changes.  ENT: Negative for hoarseness,  difficulty swallowing , nasal congestion. CV: Negative for chest pain, angina, palpitations, dyspnea on exertion, peripheral edema.  Respiratory: Negative for dyspnea at rest, dyspnea on exertion, cough, sputum, wheezing.  GI: See history of present illness. GU:  Negative for dysuria, hematuria, urinary incontinence, urinary frequency, nocturnal urination.  MS: Negative for joint pain, low back pain. S/p right BKA Derm: Negative for rash or itching.  Neuro: Negative for weakness, abnormal sensation, seizure, frequent headaches, memory loss, confusion.  Psych: Negative for anxiety, depression, suicidal ideation, hallucinations.  Endo: Negative for unusual weight change.  Heme: Negative for bruising or bleeding. Allergy: Negative for rash or hives.       Physical Examination: Vital signs in last 24 hours: Temp:  [97.9 F (36.6 C)-98.6 F (37 C)] 98.2 F (36.8 C) (08/02 1610) Pulse Rate:  [71-85] 72  (08/02 0613) Resp:  [16-20] 18  (08/02 0613) BP: (132-173)/(59-78) 155/68 mmHg (08/02 0613) SpO2:  [96 %-100 %] 98 % (08/02 0613) Weight:  [187 lb (84.823 kg)-193 lb 2 oz (87.6 kg)] 193 lb 2 oz (87.6 kg) (08/02 9604) Last BM Date: 01/20/12  General: Well-nourished, well-developed in no acute distress. Accompanied by wife. Head: Normocephalic, atraumatic.   Eyes: Conjunctiva pink, no icterus. Mouth: Oropharyngeal mucosa moist and pink , no lesions erythema or exudate. Neck: Supple without thyromegaly, masses, or lymphadenopathy.  Lungs: Clear to auscultation bilaterally.  Heart: Regular rate and rhythm, no murmurs rubs or gallops.  Abdomen: Bowel sounds are normal, moderate epig tenderness. nondistended, no hepatosplenomegaly or masses, no abdominal bruits, no rebound or guarding. Quarter sized umb hernia easily reducible and nontender.    Rectal: not performed Extremities: No left lower extremity edema, clubbing, deformity. S/p right BKA  Neuro: Alert and oriented x 4 , grossly normal  neurologically.  Skin: Warm and dry, no rash or jaundice.   Psych: Alert and cooperative, normal mood and affect.        Intake/Output from previous day: 08/01 0701 - 08/02 0700 In: 3101.4 [I.V.:2051.4; Blood:1050] Out: 650 [Urine:650] Intake/Output this shift:    Lab Results: CBC  Basename 01/21/12 0509 01/20/12 1606 01/19/12 1400  WBC 5.8 7.2 5.6  HGB 8.2* 6.1* 6.2*  HCT 26.5* 20.4* 21.5*  MCV 70.9* 65.0* 66.0*  PLT 211 261 236   Lab Results  Component Value Date   IRON 153* 01/20/2012   TIBC 334 01/20/2012   FERRITIN 38 01/20/2012   Lab Results  Component Value Date   VITAMINB12 589 01/20/2012   Lab Results  Component Value Date   FOLATE >20.0 01/20/2012    BMET  Basename 01/21/12 0509 01/20/12 1606 01/19/12 1400  NA 132* 122* 127*  K 4.0 3.7 4.1  CL 99 88* 91*  CO2 26 24 26   GLUCOSE 98 78 72  BUN 10 14 15   CREATININE 1.27 1.41* 1.45*  CALCIUM 8.5 8.8 9.2   LFT  Basename 01/20/12 1606 01/19/12 1400  BILITOT 0.2* 0.2*  BILIDIR -- --  IBILI -- --  ALKPHOS 76 78  AST 15 14  ALT 12 12  PROT 6.6 6.6  ALBUMIN 3.4* 3.4*   No results found for this basename: LIPASE    PT/INR  Basename 01/20/12 1606 01/19/12 1400  LABPROT 13.6 14.6  INR 1.02 1.12      Imaging Studies: Dg Chest 2 View  01/19/2012  *RADIOLOGY REPORT*  Clinical Data: Preadmission  CHEST - 2 VIEW  Comparison: 03/13/2008  Findings: Cardiomediastinal silhouette is stable.  No acute infiltrate or pleural effusion.  No pulmonary edema.  Stable degenerative changes thoracic spine.  IMPRESSION: No active disease.  No significant change.  Original Report Authenticated By: Natasha Mead, M.D.  Pierre.Alas week]   Impression: 61 y/o WM who presents with profound microcytic anemia after pre-op labs for upcoming CEA came back with Hgb of 6. He gives h/o chronic anemia of unclear reasons, s/p total of 17 units of prbcs, multiple EGD/colonoscopies. Has h/o gastric AVM ablated in 2007. H/O H.pylori gastritis in 2001  (?treated?). He had SB biopsy in 2000, no evidence of Celiac. 2011, cecal  AVM ?ablated?, no mention of it in report. No prior capsule endoscopy.  He now presents with c/o intermittent melena, mid-abd pain, heme positive stool. UGI tract to be evaluated initially. If negative, then colonoscopy +/- SB capsule.  Plan: 1. EGD today.  2. Based on findings, patient may need CT A/P to further evaluate abdominal pain. With h/o PVD he is at increased risk of mesenteric ischemia. Prior CT in 2001, ?of renal artery stenosis due to difference in size of kidneys.  I would like to thank the hospitalist team for allowing [place] to take part in the care of this nice patient.    LOS: 1 day   Tana Coast  01/21/2012, 9:32 AM

## 2012-01-21 NOTE — Care Management Note (Unsigned)
    Page 1 of 1   01/21/2012     1:49:46 PM   CARE MANAGEMENT NOTE 01/21/2012  Patient:  [name], [name]   Account Number:  [id number]  Date Initiated:  01/21/2012  Documentation initiated by:  Rosemary Holms  Subjective/Objective Assessment:   Pt admitted with low Hemoglobin and hyponatrimia. Pt lives at home with wife.     Action/Plan:   Spoke to pt and spouse at bedside. Pt sitting up in chair. They both do not believe that [facility] services will be needed at DC.   Anticipated DC Date:  01/22/2012   Anticipated DC Plan:  HOME/SELF CARE      DC Planning Services  CM consult      Choice offered to / List presented to:             Status of service:  In process, will continue to follow Medicare Important Message given?   (If response is "NO", the following Medicare IM given date fields will be blank) Date Medicare IM given:   Date Additional Medicare IM given:    Discharge Disposition:    Per UR Regulation:    If discussed at Long Length of Stay Meetings, dates discussed:    Comments:  01/21/12 1130 Peggie Hornak Leanord Hawking RN BSN CM

## 2012-01-21 NOTE — Progress Notes (Signed)
[facility] PROGRESS NOTE  [name] ZOX:[id number] DOB: 20-Mar-1951 DOA: 01/20/2012 PCP: [name], MD  Assessment/Plan: Principal Problem:  *Acute blood loss anemia Active Problems:  DIABETES MELLITUS  HYPERTENSION  CAD (coronary artery disease)  Amputee, below knee  Occlusion and stenosis of carotid artery without mention of cerebral infarction  GI bleeding  Hyponatremia  Acute renal failure  1. Severe anemia with heme-positive stools. The patient underwent endoscopy today which indicated a mild to moderate gastritis. Plans will be for a colonoscopy tomorrow. He is on twice a day PPI. His aspirin has been held. He was transfused 2 units of PRBCs yesterday. Hemoglobin has improved today. 2. Hypertension. On outpatient regimen, improved 3. Hyponatremia. Likely due to volume repletion, improved with IV fluids. 4. Acute renal failure. Improved with IV fluids 5. Diabetes. Fair control on sliding scale insulin 6. Carotid stenosis. He will need elective carotid endarterectomy  Code Status: full code Family Communication: discussed with patient and wife Disposition Plan: discharge home when stable   Brief narrative: [name] He was found to have severe anemia with a hemoglobin of 6. He was describing generalized fatigue and weakness on exertion. He was found to have heme positive stools and was admitted to the hospital for further evaluation.  Consultants:  Gastroenterology  Procedures:  Endoscopy on 8/2 which showed mild to moderate gastritis  Antibiotics:  None  HPI/Subjective: Patient seen after endoscopy. Denies any complaints at present  Objective: Filed Vitals:   01/21/12 1140 01/21/12 1145 01/21/12 1150 01/21/12 1155  BP: 125/49 123/44 142/64   Pulse: 80 81 82 82  Temp:      TempSrc:      Resp: 16 16 13 11   Height:      Weight:      SpO2: 100% 100% 100% 100%    Intake/Output Summary (Last 24  hours) at 01/21/12 1340 Last data filed at 01/21/12 0528  Gross per 24 hour  Intake 3101.42 ml  Output    650 ml  Net 2451.42 ml    Exam:   General:  No acute distress  Cardiovascular: S1, S2, regular rate and rhythm  Respiratory: Clear to auscultation bilaterally  Abdomen: Soft, nontender, bowel sounds active  Data Reviewed: Basic Metabolic Panel:  Lab 01/21/12 8119 01/20/12 1606 01/19/12 1400  NA 132* 122* 127*  K 4.0 3.7 4.1  CL 99 88* 91*  CO2 26 24 26   GLUCOSE 98 78 72  BUN 10 14 15   CREATININE 1.27 1.41* 1.45*  CALCIUM 8.5 8.8 9.2  MG -- -- --  PHOS -- -- --   Liver Function Tests:  Lab 01/20/12 1606 01/19/12 1400  AST 15 14  ALT 12 12  ALKPHOS 76 78  BILITOT 0.2* 0.2*  PROT 6.6 6.6  ALBUMIN 3.4* 3.4*   No results found for this basename: LIPASE:5,AMYLASE:5 in the last 168 hours No results found for this basename: AMMONIA:5 in the last 168 hours CBC:  Lab 01/21/12 0509 01/20/12 1606 01/19/12 1400  WBC 5.8 7.2 5.6  NEUTROABS -- 5.8 --  HGB 8.2* 6.1* 6.2*  HCT 26.5* 20.4* 21.5*  MCV 70.9* 65.0* 66.0*  PLT 211 261 236   Cardiac Enzymes: No results found for this basename: CKTOTAL:5,CKMB:5,CKMBINDEX:5,TROPONINI:5 in the last 168 hours BNP (last 3 results) No results found for this basename: PROBNP:3 in the last 8760 hours CBG:  Lab 01/21/12 1309 01/21/12 0736 01/20/12 2136  GLUCAP 95 101* 134*  Recent Results (from the past 240 hour(s))  SURGICAL PCR SCREEN     Status: Abnormal   Collection Time   01/19/12  2:03 PM      Component Value Range Status Comment   MRSA, PCR NEGATIVE  NEGATIVE Final    Staphylococcus aureus POSITIVE (*) NEGATIVE Final      Studies: Dg Chest 2 View  01/19/2012  *RADIOLOGY REPORT*  Clinical Data: Preadmission  CHEST - 2 VIEW  Comparison: 03/13/2008  Findings: Cardiomediastinal silhouette is stable.  No acute infiltrate or pleural effusion.  No pulmonary edema.  Stable degenerative changes thoracic spine.   IMPRESSION: No active disease.  No significant change.  Original Report Authenticated By: Natasha Mead, M.D.    Scheduled Meds:   . sodium chloride   Intravenous Once  . amLODipine  10 mg Oral Daily  . atenolol  50 mg Oral BID  . bisacodyl  10 mg Oral Q2H  . insulin aspart  0-15 Units Subcutaneous TID WC  . insulin aspart  0-5 Units Subcutaneous QHS  . meperidine      . midazolam      . pantoprazole  40 mg Oral BID AC  . pneumococcal 23 valent vaccine  0.5 mL Intramuscular Tomorrow-1000  . polyethylene glycol  4,000 mL Oral Once  . sodium chloride  3 mL Intravenous Q12H  . DISCONTD: pantoprazole (PROTONIX) IV  40 mg Intravenous Q12H   Continuous Infusions:   . sodium chloride 75 mL/hr at 01/21/12 1006    Principal Problem:  *Acute blood loss anemia Active Problems:  DIABETES MELLITUS  HYPERTENSION  CAD (coronary artery disease)  Amputee, below knee  Occlusion and stenosis of carotid artery without mention of cerebral infarction  GI bleeding  Hyponatremia  Acute renal failure    Time spent: 25 minute    Adelise Buswell  [facility] Pager [phone]. If 7PM-7AM, please contact night-coverage at [email or web address], password [facility] 01/21/2012, 1:40 PM  LOS: 1 day

## 2012-01-22 ENCOUNTER — Encounter (HOSPITAL_COMMUNITY)
Admission: EM | Disposition: A | Discharge status: Home / Self Care | Payer: Self-pay | Source: Home / Self Care | Attending: Internal Medicine

## 2012-01-22 ENCOUNTER — Encounter (HOSPITAL_COMMUNITY): Payer: Self-pay | Admitting: *Deleted

## 2012-01-22 DIAGNOSIS — K5521 Angiodysplasia of colon with hemorrhage: Secondary | ICD-10-CM

## 2012-01-22 DIAGNOSIS — K922 Gastrointestinal hemorrhage, unspecified: Secondary | ICD-10-CM

## 2012-01-22 DIAGNOSIS — D649 Anemia, unspecified: Secondary | ICD-10-CM

## 2012-01-22 HISTORY — PX: COLONOSCOPY: SHX5424

## 2012-01-22 LAB — CBC
MCH: 22.1 pg — ABNORMAL LOW (ref 26.0–34.0)
MCHC: 30.3 g/dL (ref 30.0–36.0)
MCV: 72.9 fL — ABNORMAL LOW (ref 78.0–100.0)
Platelets: 233 10*3/uL (ref 150–400)
RBC: 3.8 MIL/uL — ABNORMAL LOW (ref 4.22–5.81)
RDW: 22.4 % — ABNORMAL HIGH (ref 11.5–15.5)

## 2012-01-22 LAB — GLUCOSE, CAPILLARY: Glucose-Capillary: 109 mg/dL — ABNORMAL HIGH (ref 70–99)

## 2012-01-22 LAB — PREPARE RBC (CROSSMATCH)

## 2012-01-22 SURGERY — COLONOSCOPY
Anesthesia: Moderate Sedation

## 2012-01-22 MED ORDER — PANTOPRAZOLE SODIUM 40 MG PO TBEC: 40.0000 mg | DELAYED_RELEASE_TABLET | Freq: Two times a day (BID) | ORAL | Status: DC

## 2012-01-22 MED ORDER — MIDAZOLAM HCL 5 MG/5ML IJ SOLN
INTRAMUSCULAR | Status: DC | PRN
  Administered 2012-01-22 (×2): 2 mg via INTRAVENOUS

## 2012-01-22 MED ORDER — MEPERIDINE HCL 50 MG/ML IJ SOLN
INTRAMUSCULAR | Status: DC | PRN
  Administered 2012-01-22: 25 mg via INTRAVENOUS

## 2012-01-22 MED ORDER — MIDAZOLAM HCL 5 MG/5ML IJ SOLN
INTRAMUSCULAR | Status: AC
  Filled 2012-01-22: qty 10

## 2012-01-22 MED ORDER — POLYSACCHARIDE IRON COMPLEX 150 MG PO CAPS: 150.0000 mg | ORAL_CAPSULE | Freq: Two times a day (BID) | ORAL | Status: DC

## 2012-01-22 MED ORDER — STERILE WATER FOR IRRIGATION IR SOLN
Status: DC | PRN
  Administered 2012-01-22: 09:00:00

## 2012-01-22 MED ORDER — MEPERIDINE HCL 50 MG/ML IJ SOLN
INTRAMUSCULAR | Status: AC
  Filled 2012-01-22: qty 1

## 2012-01-22 NOTE — Op Note (Addendum)
COLONOSCOPY PROCEDURE REPORT  PATIENT:  [name]  MR#:  [id number] Birthdate:  Sep 13, 1950, 61 y.o., male Endoscopist:  Dr. Malissa Hippo, MD Referred By:  Dr. [name], MD Procedure Date: 01/22/2012  Procedure:   Colonoscopy  With APC ablation of two cecal AV malformations.  Indications:  Patient is 61 year old Caucasian male with multiple medical problems who presented with melena and hemoglobin of 6.1 g. He has history of GI AV malformations. He underwent esophagogastroduodenoscopy by Dr. Jonette Eva yesterday with APC ablation of on bleeding gastric AV malformation. He is therefore undergoing colonoscopy with therapeutic intention. 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 Patient has received 2 units of PRBCs and his hemoglobin is up to 8.4 g.  Informed Consent:  The procedure and risks were reviewed with the patient and informed consent was obtained.  Medications:  Demerol 25 mg IV Versed 4 mg IV  Description of procedure:  After a digital rectal exam was performed, that colonoscope was advanced from the anus through the rectum and colon to the area of the cecum, ileocecal valve and appendiceal orifice. The cecum was deeply intubated. These structures were well-seen and photographed for the record. From the level of the cecum and ileocecal valve, the scope was slowly and cautiously withdrawn. The mucosal surfaces were carefully surveyed utilizing scope tip to flexion to facilitate fold flattening as needed. The scope was pulled down into the rectum where a thorough exam including retroflexion was performed.  Findings:   Prep satisfactory. No fresh or old blood noted in the colon. 2 cecal AV malformations identified and ablated with argon plasma coagulator. There were both 8-10 mm in maximal diameter. One of these bled during APC therapy but hemostasis easily achieved. Normal rectal mucosa. Small hemorrhoids below the dentate line.  Therapeutic/Diagnostic Maneuvers  Performed:  See above  Complications:  None  Cecal Withdrawal Time:  16 minutes  Impression:  Two cecal AV malformations without stigmata of bleed with maximal.meter of 8-10 mm. Both of these are ablated with argon plasma coagulator. Small external hemorrhoids.  Recommendations:  Advance diet. Consider one unit of PRBCs prior to discharge. Followup with Dr. Jonette Eva in 2-3 weeks.  Piero Mustard U  01/22/2012 9:31 AM  CC: Dr. [name], MD & Dr. Bonnetta Barry ref. provider found         Dr. Jonette Eva, MD.

## 2012-01-22 NOTE — Discharge Summary (Signed)
Physician Discharge Summary  [name]:[id number] DOB: 11/04/1950 DOA: 01/20/2012  PCP: [name] Heap, MD  Admit date: 01/20/2012 Discharge date: 01/22/2012  Recommendations for Outpatient Follow-up:  1. Follow up with primary doctor in 1 week 2. Follow up with vascular surgery in 1 week 3. Follow up with gastroenterology in 3 weeks  Discharge Diagnoses:  Principal Problem:  *Acute blood loss anemia Active Problems:  DIABETES MELLITUS  HYPERTENSION  CAD (coronary artery disease)  Amputee, below knee  Occlusion and stenosis of carotid artery without mention of cerebral infarction  GI bleeding  Hyponatremia  Acute renal failure   Discharge Condition: improved  Diet recommendation: low salt, low carb  Wt Readings from Last 3 Encounters:  01/22/12 87.045 kg (191 lb 14.4 oz)  01/22/12 87.045 kg (191 lb 14.4 oz)  01/22/12 87.045 kg (191 lb 14.4 oz)    History of present illness:  This is a 61 year old gentleman with a history of hypertension, diabetes, chronic anemia, carotid stenosis, possible chronic kidney disease, possible CHF. Patient had gone to see a vascular surgeon regarding carotid endarterectomy. Patient was found to have a carotid bruit and subsequently underwent carotid Dopplers. Results revealed stenosis in the left internal carotid artery of 80-99% and on the right side was 60-79%. He had gone to see Dr. Arbie [name] regarding options for carotid endarterectomy. Preoperative workup indicated a significant anemia. Patient was found hemoglobin of less than 6. He was referred to the emergency room by his primary care doctor. Blood work in the emergency room confirmed a hemoglobin of 6.1. Patient reports feeling increasingly tired and weak over the past 2-3 months. He's not had any chest pain, lightheadedness or dizziness. He denies any orthopnea. His main complaint is that he just feels tired. He still able to do his daily activities. He did notice that his stools are  somewhat dark this morning. They were found to be quite positive in the emergency room. Outside of today, he describes his stool as being brown and has not noted any significant bleeding he does not take daily NSAIDs, although he does take an aspirin every day. He denies any hematemesis or hematuria. He is less approximately 5 pounds over the last 2 months, but this was intentional. He's not had any unexplained weight loss otherwise. He is a chronic smoker and smokes 1-1/2 packs per day. He he had a colonoscopy by [name] in 2011 which revealed AV node formation the cecum, diverticula in the sigmoid colon and internal hemorrhoids. Prior to that he had a upper endoscopy done in 2008 per Dr. Madilyn [name] which revealed antral gastritis and a single gastric AVM that was treated with laser therapy. The patient reports his by mouth intake has been fair. He was also found to be hyponatremic in the emergency room. He has been referred for admission for workup of anemia hyponatremia   Hospital Course:  Patient was admitted to the hospital with severe anemia, heme positives stools.  He underwent colonoscopy and EGD which showed cecal AVMs, which were the likely source of his chronic blood loss. He was transfused a total of 3 unit of prbc.  BID PPI was recommended.  He has not had any further bleeding, is doing well and wants to go home.  He will follow up with Dr. Darrick [name] in 3 weeks  Hyponatremia improved with IV fluids, likely due to volume depletion  He will follow up with vascular surgery regarding carotid stenosis and need for carotid endarterectomy.  Will continue on aspirin  for now since benefits outweigh risks   Procedures:  Endoscopy on 01/21/12 showing mild to moderate gastritis  Colonoscopy on 8/3 showing Two cecal AV malformations without stigmata of bleed with maximal.meter of 8-10 mm. Both of these are ablated with argon plasma coagulator.         Small external  hemorrhoids.  Consultations:  Gastroenterology  Discharge Exam: Filed Vitals:   01/22/12 0920  BP: 126/41  Pulse: 76  Temp:   Resp: 14   Filed Vitals:   01/22/12 0910 01/22/12 0915 01/22/12 0916 01/22/12 0920  BP: 129/44 126/42 153/54 126/41  Pulse: 77 77  76  Temp:   98.3 F (36.8 C)   TempSrc:      Resp: 13 11 14 14   Height:      Weight:      SpO2: 98% 99% 95% 99%    General: NAD Cardiovascular: s1, s2 rrr Respiratory: cta b  Discharge Instructions  Discharge Orders    Future Appointments: Provider: Department: Dept Phone: Center:   02/03/2012 10:00 AM [name] Layman, MD [facility] [name] Office [phone] [facility]     Future Orders Please Complete By Expires   Diet - low sodium heart healthy      Diet Carb Modified      Increase activity slowly      Call MD for:  extreme fatigue      Call MD for:  persistant dizziness or light-headedness      Call MD for:  difficulty breathing, headache or visual disturbances        Medication List  As of 01/22/2012 11:59 AM   TAKE these medications         amLODipine 10 MG tablet   Commonly known as: NORVASC   Take 10 mg by mouth every morning.      aspirin 325 MG tablet   Take 325 mg by mouth every morning.      atenolol 100 MG tablet   Commonly known as: TENORMIN   Take 50 mg by mouth 2 (two) times daily.      EDARBI 80 MG Tabs   Generic drug: Azilsartan Medoxomil   Take 1 tablet by mouth every morning.      furosemide 40 MG tablet   Commonly known as: LASIX   Take 40 mg by mouth as needed.      glipiZIDE 10 MG tablet   Commonly known as: GLUCOTROL   Take 10 mg by mouth every morning.      iron polysaccharides 150 MG capsule   Commonly known as: NIFEREX   Take 1 capsule (150 mg total) by mouth 2 (two) times daily.      pantoprazole 40 MG tablet   Commonly known as: PROTONIX   Take 1 tablet (40 mg total) by mouth 2 (two) times daily before a meal.           Follow-up Information    Follow up with  [name] Eva, MD. Schedule an appointment as soon as possible for a visit in 3 weeks.   Contact information:   [address] [place] [address] [phone]       Follow up with [name] Heap, MD. Schedule an appointment as soon as possible for a visit in 1 week.   Contact information:   [address] [place] [address] [phone]       Follow up with EARLY, TODD, MD. Schedule an appointment as soon as  possible for a visit in 1 week.   Contact information:   [address] [place] [address] [phone]           The results of significant diagnostics from this hospitalization (including imaging, microbiology, ancillary and laboratory) are listed below for reference.    Significant Diagnostic Studies: Dg Chest 2 View  01/19/2012  *RADIOLOGY REPORT*  Clinical Data: Preadmission  CHEST - 2 VIEW  Comparison: 03/13/2008  Findings: Cardiomediastinal silhouette is stable.  No acute infiltrate or pleural effusion.  No pulmonary edema.  Stable degenerative changes thoracic spine.  IMPRESSION: No active disease.  No significant change.  Original Report Authenticated By: Natasha Mead, M.D.    Microbiology: Recent Results (from the past 240 hour(s))  SURGICAL PCR SCREEN     Status: Abnormal   Collection Time   01/19/12  2:03 PM      Component Value Range Status Comment   MRSA, PCR NEGATIVE  NEGATIVE Final    Staphylococcus aureus POSITIVE (*) NEGATIVE Final      Labs: Basic Metabolic Panel:  Lab 01/21/12 6606 01/20/12 1606 01/19/12 1400  NA 132* 122* 127*  K 4.0 3.7 4.1  CL 99 88* 91*  CO2 26 24 26   GLUCOSE 98 78 72  BUN 10 14 15   CREATININE 1.27 1.41* 1.45*  CALCIUM 8.5 8.8 9.2  MG -- -- --  PHOS -- -- --   Liver Function Tests:  Lab 01/20/12 1606 01/19/12 1400  AST 15 14  ALT 12 12  ALKPHOS 76 78  BILITOT 0.2* 0.2*  PROT 6.6 6.6  ALBUMIN 3.4* 3.4*   No results found for this  basename: LIPASE:5,AMYLASE:5 in the last 168 hours No results found for this basename: AMMONIA:5 in the last 168 hours CBC:  Lab 01/22/12 0640 01/21/12 0509 01/20/12 1606 01/19/12 1400  WBC 9.2 5.8 7.2 5.6  NEUTROABS -- -- 5.8 --  HGB 8.4* 8.2* 6.1* 6.2*  HCT 27.7* 26.5* 20.4* 21.5*  MCV 72.9* 70.9* 65.0* 66.0*  PLT 233 211 261 236   Cardiac Enzymes: No results found for this basename: CKTOTAL:5,CKMB:5,CKMBINDEX:5,TROPONINI:5 in the last 168 hours BNP: BNP (last 3 results) No results found for this basename: PROBNP:3 in the last 8760 hours CBG:  Lab 01/22/12 1118 01/22/12 0717 01/21/12 2049 01/21/12 1631 01/21/12 1309  GLUCAP 188* 109* 92 225* 95    Time coordinating discharge: greater than 30 minutes  Signed:  MEMON,JEHANZEB  [facility] 01/22/2012, 11:59 AM

## 2012-01-22 NOTE — OR Nursing (Signed)
Pre Op process completed @ 825.  Did not complete documentation until 928.

## 2012-01-22 NOTE — Progress Notes (Signed)
Pt discharged home today per Dr. Memon. Pt's IV site D/C'd and WNL. Pt's VS stable at this time. Pt provided with home medication list, discharge instructions and prescriptions. Verbalized understanding. Pt left floor via WC in stable condition accompanied by NT. 

## 2012-01-23 LAB — TYPE AND SCREEN
ABO/RH(D): O POS
Antibody Screen: NEGATIVE
Unit division: 0

## 2012-01-24 ENCOUNTER — Inpatient Hospital Stay (HOSPITAL_COMMUNITY): Admission: RE | Admit: 2012-01-24 | Payer: Medicaid Other | Source: Ambulatory Visit | Admitting: Vascular Surgery

## 2012-01-24 ENCOUNTER — Encounter (HOSPITAL_COMMUNITY): Admission: RE | Payer: Self-pay | Source: Ambulatory Visit

## 2012-01-24 LAB — GLUCOSE, CAPILLARY: Glucose-Capillary: 123 mg/dL — ABNORMAL HIGH (ref 70–99)

## 2012-01-24 SURGERY — ENDARTERECTOMY, CAROTID
Anesthesia: General | Site: Neck | Laterality: Left

## 2012-01-25 ENCOUNTER — Encounter (HOSPITAL_COMMUNITY): Payer: Self-pay | Admitting: Internal Medicine

## 2012-01-26 ENCOUNTER — Other Ambulatory Visit: Payer: Self-pay

## 2012-01-26 ENCOUNTER — Encounter (HOSPITAL_COMMUNITY): Payer: Self-pay | Admitting: Pharmacy Technician

## 2012-02-03 ENCOUNTER — Ambulatory Visit: Payer: Self-pay | Admitting: Gastroenterology

## 2012-02-03 ENCOUNTER — Encounter (HOSPITAL_COMMUNITY): Payer: Self-pay

## 2012-02-03 ENCOUNTER — Encounter (HOSPITAL_COMMUNITY)
Admission: RE | Admit: 2012-02-03 | Discharge: 2012-02-03 | Payer: Medicaid Other | Source: Ambulatory Visit | Attending: Vascular Surgery | Admitting: Vascular Surgery

## 2012-02-03 HISTORY — DX: Shortness of breath: R06.02

## 2012-02-03 HISTORY — DX: Gastro-esophageal reflux disease without esophagitis: K21.9

## 2012-02-03 HISTORY — DX: Anemia, unspecified: D64.9

## 2012-02-03 HISTORY — DX: Chronic kidney disease, unspecified: N18.9

## 2012-02-03 HISTORY — DX: Unspecified osteoarthritis, unspecified site: M19.90

## 2012-02-03 LAB — CBC
MCH: 23.3 pg — ABNORMAL LOW (ref 26.0–34.0)
MCHC: 31.9 g/dL (ref 30.0–36.0)
Platelets: 381 10*3/uL (ref 150–400)
RBC: 4.55 MIL/uL (ref 4.22–5.81)

## 2012-02-03 LAB — COMPREHENSIVE METABOLIC PANEL
ALT: 12 U/L (ref 0–53)
AST: 18 U/L (ref 0–37)
Albumin: 2.9 g/dL — ABNORMAL LOW (ref 3.5–5.2)
Calcium: 9.2 mg/dL (ref 8.4–10.5)
Sodium: 129 mEq/L — ABNORMAL LOW (ref 135–145)
Total Protein: 6.4 g/dL (ref 6.0–8.3)

## 2012-02-03 LAB — URINALYSIS, ROUTINE W REFLEX MICROSCOPIC
Bilirubin Urine: NEGATIVE
Hgb urine dipstick: NEGATIVE
Specific Gravity, Urine: 1.011 (ref 1.005–1.030)
pH: 6.5 (ref 5.0–8.0)

## 2012-02-03 LAB — PROTIME-INR: Prothrombin Time: 13.7 seconds (ref 11.6–15.2)

## 2012-02-03 LAB — URINE MICROSCOPIC-ADD ON

## 2012-02-03 NOTE — Pre-Procedure Instructions (Signed)
[address] [place]  02/03/2012   Your procedure is scheduled on:  02/09/2012  Report to [facility] at 6:30 AM.  Call this number if you have problems the morning of surgery: [phone]   Remember:   Do not eat food or drink liquid :After Midnight.  TUESDAY     Take these medicines the morning of surgery with A SIP OF WATER: NORVASC, ATENOLOL, PROTONIX   Do not wear jewelry  Do not wear lotions, powders, or perfumes. You may wear deodorant.                Men may shave face and neck.   Do not bring valuables to the hospital.   Contacts, dentures or bridgework may not be worn into surgery.  Leave suitcase in the car. After surgery it may be brought to your room.  For patients admitted to the hospital, checkout time is 11:00 AM the day of discharge.   Patients discharged the day of surgery will not be allowed to drive home.  Name and phone number of your driver: /w wife  Special Instructions: CHG Shower Use Special Wash: 1/2 bottle night before surgery and 1/2 bottle morning of surgery.   Please read over the following fact sheets that you were given: Pain Booklet, Coughing and Deep Breathing, Blood Transfusion Information, MRSA Information and Surgical Site Infection Prevention

## 2012-02-03 NOTE — Progress Notes (Signed)
Call fr. Bld. Bank stating new sample must be collected DOS, due to recent transfusion at [facility].

## 2012-02-04 NOTE — Consult Note (Signed)
Anesthesia chart review: Patient is a 61 year old male scheduled for a left carotid endarterectomy on 02/09/2012 by Dr. Arbie Cookey.  This procedure was initially scheduled for earlier this month however, his preoperative labs revealed significant anemia with a hemoglobin of 6.2. He was referred to the emergency department, where he underwent GI workup revealing 2 cecal AVMs treated with ablation on 01/22/2012. 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  PCP is Dr. [name].  Gastroenterologist is Dr. Jonette Eva.    Carotid duplex on 01/11/2012 showed greater than 80% stenosis of the left ICA and  60-79% stenosis on the right.  EKG on 01/19/12 showed NSR.    Echo on 06/08/06 showed: - There is diastolic dysfunction. Overall left ventricular systolic function was normal. Left ventricular ejection fraction was estimated to be 65 %. There was no diagnostic evidence of left ventricular regional wall motion abnormalities. Left ventricular wall thickness was at the upper limits of normal. - Aortic valve thickness was mildly increased. There was lower normal aortic valve leaflet excursion.  CXR from 01/19/12 showed Cardiomediastinal silhouette is stable. No acute infiltrate or pleural effusion. No pulmonary edema. Stable degenerative changes thoracic spine.   Labs noted. Sodium is 129, chloride 93, creatinine 1.26, glucose 155, albumin 2.9, hemoglobin 10.6 and hematocrit 33.2 (improved), platelet count 381, PT/PTT within normal limits.  He is for a T&S on arrival.  He will also need a repeat BMET to re-evaluate his hyponatremia.    If follow-up labs are reasonable and no acute CV symptoms then plan to proceed.   Reviewed with Anesthesiologist Dr. Jacklynn Bue.  Shonna Chock, PA-C

## 2012-02-06 ENCOUNTER — Telehealth: Payer: Self-pay | Admitting: Gastroenterology

## 2012-02-06 NOTE — Telephone Encounter (Signed)
Please call pt. His stomach Bx shows mild gastritis DUE TO ASPIRIN USE. Continue PROTONIX 30 minutes prior to meals BID. OPV IN [facility] 2013.

## 2012-02-07 NOTE — Telephone Encounter (Signed)
Called and informed pt.  

## 2012-02-07 NOTE — Telephone Encounter (Signed)
Recall made for Nov

## 2012-02-08 MED ORDER — SODIUM CHLORIDE 0.9 % IV SOLN: INTRAVENOUS | Status: DC

## 2012-02-08 MED ORDER — DEXTROSE 5 % IV SOLN
1.5000 g | INTRAVENOUS | Status: AC
  Administered 2012-02-09: 1.5 g via INTRAVENOUS
  Filled 2012-02-08: qty 1.5

## 2012-02-09 ENCOUNTER — Encounter (HOSPITAL_COMMUNITY): Payer: Self-pay | Admitting: Vascular Surgery

## 2012-02-09 ENCOUNTER — Encounter (HOSPITAL_COMMUNITY): Payer: Self-pay | Admitting: *Deleted

## 2012-02-09 ENCOUNTER — Inpatient Hospital Stay (HOSPITAL_COMMUNITY): Payer: Medicaid Other | Admitting: Vascular Surgery

## 2012-02-09 ENCOUNTER — Inpatient Hospital Stay (HOSPITAL_COMMUNITY)
Admission: RE | Admit: 2012-02-09 | Discharge: 2012-02-10 | DRG: 039 | Disposition: A | Discharge status: Home / Self Care | Payer: Medicaid Other | Source: Ambulatory Visit | Attending: Vascular Surgery | Admitting: Vascular Surgery

## 2012-02-09 ENCOUNTER — Encounter (HOSPITAL_COMMUNITY)
Admission: RE | Disposition: A | Discharge status: Home / Self Care | Payer: Self-pay | Source: Ambulatory Visit | Attending: Vascular Surgery

## 2012-02-09 DIAGNOSIS — Z91041 Radiographic dye allergy status: Secondary | ICD-10-CM

## 2012-02-09 DIAGNOSIS — Z8719 Personal history of other diseases of the digestive system: Secondary | ICD-10-CM

## 2012-02-09 DIAGNOSIS — I739 Peripheral vascular disease, unspecified: Secondary | ICD-10-CM | POA: Diagnosis present

## 2012-02-09 DIAGNOSIS — I6529 Occlusion and stenosis of unspecified carotid artery: Secondary | ICD-10-CM

## 2012-02-09 DIAGNOSIS — I129 Hypertensive chronic kidney disease with stage 1 through stage 4 chronic kidney disease, or unspecified chronic kidney disease: Secondary | ICD-10-CM | POA: Diagnosis present

## 2012-02-09 DIAGNOSIS — Z79899 Other long term (current) drug therapy: Secondary | ICD-10-CM

## 2012-02-09 DIAGNOSIS — K429 Umbilical hernia without obstruction or gangrene: Secondary | ICD-10-CM | POA: Diagnosis present

## 2012-02-09 DIAGNOSIS — F17200 Nicotine dependence, unspecified, uncomplicated: Secondary | ICD-10-CM | POA: Diagnosis present

## 2012-02-09 DIAGNOSIS — E785 Hyperlipidemia, unspecified: Secondary | ICD-10-CM | POA: Diagnosis present

## 2012-02-09 DIAGNOSIS — Z882 Allergy status to sulfonamides status: Secondary | ICD-10-CM

## 2012-02-09 DIAGNOSIS — I509 Heart failure, unspecified: Secondary | ICD-10-CM | POA: Diagnosis present

## 2012-02-09 DIAGNOSIS — F411 Generalized anxiety disorder: Secondary | ICD-10-CM | POA: Diagnosis present

## 2012-02-09 DIAGNOSIS — Z7982 Long term (current) use of aspirin: Secondary | ICD-10-CM

## 2012-02-09 DIAGNOSIS — K219 Gastro-esophageal reflux disease without esophagitis: Secondary | ICD-10-CM | POA: Diagnosis present

## 2012-02-09 DIAGNOSIS — N189 Chronic kidney disease, unspecified: Secondary | ICD-10-CM | POA: Diagnosis present

## 2012-02-09 DIAGNOSIS — M479 Spondylosis, unspecified: Secondary | ICD-10-CM | POA: Diagnosis present

## 2012-02-09 DIAGNOSIS — E119 Type 2 diabetes mellitus without complications: Secondary | ICD-10-CM | POA: Diagnosis present

## 2012-02-09 DIAGNOSIS — S88119A Complete traumatic amputation at level between knee and ankle, unspecified lower leg, initial encounter: Secondary | ICD-10-CM

## 2012-02-09 HISTORY — DX: Anxiety disorder, unspecified: F41.9

## 2012-02-09 HISTORY — PX: CAROTID ENDARTERECTOMY: SUR193

## 2012-02-09 HISTORY — PX: ENDARTERECTOMY: SHX5162

## 2012-02-09 LAB — BASIC METABOLIC PANEL
Calcium: 9.2 mg/dL (ref 8.4–10.5)
Creatinine, Ser: 1.27 mg/dL (ref 0.50–1.35)
GFR calc non Af Amer: 59 mL/min — ABNORMAL LOW (ref >90)
Glucose, Bld: 140 mg/dL — ABNORMAL HIGH (ref 70–99)
Sodium: 137 mEq/L (ref 135–145)

## 2012-02-09 LAB — TYPE AND SCREEN
ABO/RH(D): O POS
Antibody Screen: NEGATIVE

## 2012-02-09 LAB — GLUCOSE, CAPILLARY
Glucose-Capillary: 132 mg/dL — ABNORMAL HIGH (ref 70–99)
Glucose-Capillary: 132 mg/dL — ABNORMAL HIGH (ref 70–99)
Glucose-Capillary: 145 mg/dL — ABNORMAL HIGH (ref 70–99)
Glucose-Capillary: 158 mg/dL — ABNORMAL HIGH (ref 70–99)

## 2012-02-09 SURGERY — ENDARTERECTOMY, CAROTID
Anesthesia: General | Site: Neck | Laterality: Left | Wound class: Clean

## 2012-02-09 MED ORDER — PROPOFOL 10 MG/ML IV EMUL
INTRAVENOUS | Status: DC | PRN
  Administered 2012-02-09: 150 mg via INTRAVENOUS

## 2012-02-09 MED ORDER — INSULIN ASPART 100 UNIT/ML ~~LOC~~ SOLN
0.0000 [IU] | Freq: Three times a day (TID) | SUBCUTANEOUS | Status: DC
  Administered 2012-02-09: 2 [IU] via SUBCUTANEOUS

## 2012-02-09 MED ORDER — ONDANSETRON HCL 4 MG/2ML IJ SOLN
INTRAMUSCULAR | Status: DC | PRN
  Administered 2012-02-09: 4 mg via INTRAVENOUS

## 2012-02-09 MED ORDER — POTASSIUM CHLORIDE CRYS ER 20 MEQ PO TBCR: 20.0000 meq | EXTENDED_RELEASE_TABLET | Freq: Once | ORAL | Status: AC | PRN

## 2012-02-09 MED ORDER — LABETALOL HCL 5 MG/ML IV SOLN
10.0000 mg | INTRAVENOUS | Status: DC | PRN
  Administered 2012-02-09: 10 mg via INTRAVENOUS

## 2012-02-09 MED ORDER — ONDANSETRON HCL 4 MG/2ML IJ SOLN
INTRAMUSCULAR | Status: AC
  Filled 2012-02-09: qty 2

## 2012-02-09 MED ORDER — DEXTROSE 5 % IV SOLN
1.5000 g | Freq: Two times a day (BID) | INTRAVENOUS | Status: DC
  Administered 2012-02-09: 1.5 g via INTRAVENOUS
  Filled 2012-02-09 (×2): qty 1.5

## 2012-02-09 MED ORDER — ASPIRIN EC 325 MG PO TBEC
325.0000 mg | DELAYED_RELEASE_TABLET | Freq: Every day | ORAL | Status: DC
  Administered 2012-02-09: 325 mg via ORAL
  Filled 2012-02-09: qty 1

## 2012-02-09 MED ORDER — ALUM & MAG HYDROXIDE-SIMETH 200-200-20 MG/5ML PO SUSP: 15.0000 mL | ORAL | Status: DC | PRN

## 2012-02-09 MED ORDER — IRBESARTAN 300 MG PO TABS
300.0000 mg | ORAL_TABLET | Freq: Every day | ORAL | Status: DC
  Administered 2012-02-09: 300 mg via ORAL
  Filled 2012-02-09 (×2): qty 1

## 2012-02-09 MED ORDER — LABETALOL HCL 5 MG/ML IV SOLN
INTRAVENOUS | Status: AC
  Filled 2012-02-09: qty 4

## 2012-02-09 MED ORDER — GLYCOPYRROLATE 0.2 MG/ML IJ SOLN
INTRAMUSCULAR | Status: DC | PRN
  Administered 2012-02-09: .8 mg via INTRAVENOUS

## 2012-02-09 MED ORDER — PROTAMINE SULFATE 10 MG/ML IV SOLN
INTRAVENOUS | Status: DC | PRN
  Administered 2012-02-09: 50 mg via INTRAVENOUS

## 2012-02-09 MED ORDER — POLYSACCHARIDE IRON COMPLEX 150 MG PO CAPS
150.0000 mg | ORAL_CAPSULE | Freq: Two times a day (BID) | ORAL | Status: DC
  Administered 2012-02-09 (×2): 150 mg via ORAL
  Filled 2012-02-09 (×4): qty 1

## 2012-02-09 MED ORDER — ACETAMINOPHEN 650 MG RE SUPP: 325.0000 mg | RECTAL | Status: DC | PRN

## 2012-02-09 MED ORDER — BISACODYL 10 MG RE SUPP: 10.0000 mg | Freq: Every day | RECTAL | Status: DC | PRN

## 2012-02-09 MED ORDER — PHENYLEPHRINE HCL 10 MG/ML IJ SOLN
10.0000 mg | INTRAVENOUS | Status: DC | PRN
  Administered 2012-02-09: 20 ug/min via INTRAVENOUS

## 2012-02-09 MED ORDER — ONDANSETRON HCL 4 MG/2ML IJ SOLN
4.0000 mg | Freq: Four times a day (QID) | INTRAMUSCULAR | Status: DC | PRN
  Administered 2012-02-09: 4 mg via INTRAVENOUS
  Filled 2012-02-09: qty 2

## 2012-02-09 MED ORDER — ONDANSETRON HCL 4 MG/2ML IJ SOLN
4.0000 mg | Freq: Four times a day (QID) | INTRAMUSCULAR | Status: AC | PRN
  Administered 2012-02-09: 4 mg via INTRAVENOUS

## 2012-02-09 MED ORDER — MORPHINE SULFATE 2 MG/ML IJ SOLN
2.0000 mg | INTRAMUSCULAR | Status: DC | PRN
  Administered 2012-02-09: 2 mg via INTRAVENOUS
  Filled 2012-02-09: qty 1

## 2012-02-09 MED ORDER — PANTOPRAZOLE SODIUM 40 MG PO TBEC: 40.0000 mg | DELAYED_RELEASE_TABLET | Freq: Every day | ORAL | Status: DC

## 2012-02-09 MED ORDER — LIDOCAINE HCL (CARDIAC) 20 MG/ML IV SOLN
INTRAVENOUS | Status: DC | PRN
  Administered 2012-02-09: 80 mg via INTRAVENOUS

## 2012-02-09 MED ORDER — 0.9 % SODIUM CHLORIDE (POUR BTL) OPTIME
TOPICAL | Status: DC | PRN
  Administered 2012-02-09: 1000 mL

## 2012-02-09 MED ORDER — ASPIRIN 325 MG PO TABS: 325.0000 mg | ORAL_TABLET | Freq: Every morning | ORAL | Status: DC

## 2012-02-09 MED ORDER — OXYCODONE-ACETAMINOPHEN 5-325 MG PO TABS
1.0000 | ORAL_TABLET | ORAL | Status: DC | PRN
  Administered 2012-02-09: 1 via ORAL
  Filled 2012-02-09: qty 1

## 2012-02-09 MED ORDER — SODIUM CHLORIDE 0.9 % IR SOLN
Status: DC | PRN
  Administered 2012-02-09: 10:00:00

## 2012-02-09 MED ORDER — GLIPIZIDE 10 MG PO TABS
10.0000 mg | ORAL_TABLET | Freq: Every morning | ORAL | Status: DC
  Administered 2012-02-09: 10 mg via ORAL
  Filled 2012-02-09 (×2): qty 1

## 2012-02-09 MED ORDER — LACTATED RINGERS IV SOLN
INTRAVENOUS | Status: DC | PRN
  Administered 2012-02-09 (×2): via INTRAVENOUS

## 2012-02-09 MED ORDER — DOPAMINE-DEXTROSE 3.2-5 MG/ML-% IV SOLN: 3.0000 ug/kg/min | INTRAVENOUS | Status: DC

## 2012-02-09 MED ORDER — ACETAMINOPHEN 325 MG PO TABS: 325.0000 mg | ORAL_TABLET | ORAL | Status: DC | PRN

## 2012-02-09 MED ORDER — GUAIFENESIN-DM 100-10 MG/5ML PO SYRP: 15.0000 mL | ORAL_SOLUTION | ORAL | Status: DC | PRN

## 2012-02-09 MED ORDER — FENTANYL CITRATE 0.05 MG/ML IJ SOLN
INTRAMUSCULAR | Status: DC | PRN
  Administered 2012-02-09: 50 ug via INTRAVENOUS
  Administered 2012-02-09: 100 ug via INTRAVENOUS
  Administered 2012-02-09 (×2): 50 ug via INTRAVENOUS

## 2012-02-09 MED ORDER — HEPARIN SODIUM (PORCINE) 1000 UNIT/ML IJ SOLN
INTRAMUSCULAR | Status: DC | PRN
  Administered 2012-02-09: 8 mL via INTRAVENOUS

## 2012-02-09 MED ORDER — FUROSEMIDE 40 MG PO TABS
40.0000 mg | ORAL_TABLET | Freq: Every day | ORAL | Status: DC | PRN
  Filled 2012-02-09: qty 1

## 2012-02-09 MED ORDER — NEOSTIGMINE METHYLSULFATE 1 MG/ML IJ SOLN
INTRAMUSCULAR | Status: DC | PRN
  Administered 2012-02-09: 5 mg via INTRAVENOUS

## 2012-02-09 MED ORDER — HYDROMORPHONE HCL PF 1 MG/ML IJ SOLN: 0.2500 mg | INTRAMUSCULAR | Status: DC | PRN

## 2012-02-09 MED ORDER — DOCUSATE SODIUM 100 MG PO CAPS: 100.0000 mg | ORAL_CAPSULE | Freq: Every day | ORAL | Status: DC

## 2012-02-09 MED ORDER — ATENOLOL 50 MG PO TABS
50.0000 mg | ORAL_TABLET | Freq: Two times a day (BID) | ORAL | Status: DC
Start: 2012-02-09 — End: 2012-02-10
  Administered 2012-02-09 (×2): 50 mg via ORAL
  Filled 2012-02-09 (×4): qty 1

## 2012-02-09 MED ORDER — SENNOSIDES-DOCUSATE SODIUM 8.6-50 MG PO TABS
1.0000 | ORAL_TABLET | Freq: Every evening | ORAL | Status: DC | PRN
  Filled 2012-02-09: qty 1

## 2012-02-09 MED ORDER — MAGNESIUM SULFATE 40 MG/ML IJ SOLN
2.0000 g | Freq: Once | INTRAMUSCULAR | Status: AC | PRN
  Filled 2012-02-09: qty 50

## 2012-02-09 MED ORDER — ROCURONIUM BROMIDE 100 MG/10ML IV SOLN
INTRAVENOUS | Status: DC | PRN
  Administered 2012-02-09: 50 mg via INTRAVENOUS

## 2012-02-09 MED ORDER — PANTOPRAZOLE SODIUM 40 MG PO TBEC
40.0000 mg | DELAYED_RELEASE_TABLET | Freq: Two times a day (BID) | ORAL | Status: DC
  Filled 2012-02-09: qty 1

## 2012-02-09 MED ORDER — SODIUM CHLORIDE 0.9 % IV SOLN
500.0000 mL | Freq: Once | INTRAVENOUS | Status: AC | PRN
Start: 2012-02-09 — End: 2012-02-09

## 2012-02-09 MED ORDER — METOPROLOL TARTRATE 1 MG/ML IV SOLN: 2.0000 mg | INTRAVENOUS | Status: DC | PRN

## 2012-02-09 MED ORDER — LIDOCAINE HCL (PF) 1 % IJ SOLN
INTRAMUSCULAR | Status: AC
  Filled 2012-02-09: qty 30

## 2012-02-09 MED ORDER — MUPIROCIN 2 % EX OINT
TOPICAL_OINTMENT | Freq: Two times a day (BID) | CUTANEOUS | Status: DC
  Administered 2012-02-09: 22:00:00 via NASAL
  Administered 2012-02-09: 1 via NASAL
  Filled 2012-02-09: qty 22

## 2012-02-09 MED ORDER — HYDRALAZINE HCL 20 MG/ML IJ SOLN: 10.0000 mg | INTRAMUSCULAR | Status: DC | PRN

## 2012-02-09 MED ORDER — PHENOL 1.4 % MT LIQD: 1.0000 | OROMUCOSAL | Status: DC | PRN

## 2012-02-09 MED ORDER — AMLODIPINE BESYLATE 10 MG PO TABS
10.0000 mg | ORAL_TABLET | Freq: Every day | ORAL | Status: DC
  Filled 2012-02-09 (×2): qty 1

## 2012-02-09 MED ORDER — MIDAZOLAM HCL 5 MG/5ML IJ SOLN
INTRAMUSCULAR | Status: DC | PRN
  Administered 2012-02-09: 2 mg via INTRAVENOUS

## 2012-02-09 SURGICAL SUPPLY — 49 items
APL SKNCLS STERI-STRIP NONHPOA (GAUZE/BANDAGES/DRESSINGS) ×1
BENZOIN TINCTURE PRP APPL 2/3 (GAUZE/BANDAGES/DRESSINGS) ×2 IMPLANT
CANISTER SUCTION 2500CC (MISCELLANEOUS) ×2 IMPLANT
CATH ROBINSON RED A/P 18FR (CATHETERS) ×2 IMPLANT
CLIP LIGATING EXTRA MED SLVR (CLIP) ×2 IMPLANT
CLIP LIGATING EXTRA SM BLUE (MISCELLANEOUS) ×2 IMPLANT
CLOTH BEACON ORANGE TIMEOUT ST (SAFETY) ×2 IMPLANT
COVER SURGICAL LIGHT HANDLE (MISCELLANEOUS) ×2 IMPLANT
CRADLE DONUT ADULT HEAD (MISCELLANEOUS) ×2 IMPLANT
DECANTER SPIKE VIAL GLASS SM (MISCELLANEOUS) IMPLANT
DRAIN HEMOVAC 1/8 X 5 (WOUND CARE) IMPLANT
DRAPE PROXIMA HALF (DRAPES) ×1 IMPLANT
DRAPE WARM FLUID 44X44 (DRAPE) ×2 IMPLANT
ELECT REM PT RETURN 9FT ADLT (ELECTROSURGICAL) ×2
ELECTRODE REM PT RTRN 9FT ADLT (ELECTROSURGICAL) ×1 IMPLANT
EVACUATOR SILICONE 100CC (DRAIN) IMPLANT
GEL ULTRASOUND 20GR AQUASONIC (MISCELLANEOUS) ×1 IMPLANT
GLOVE BIO SURGEON STRL SZ 6.5 (GLOVE) ×1 IMPLANT
GLOVE BIOGEL PI IND STRL 6.5 (GLOVE) IMPLANT
GLOVE BIOGEL PI IND STRL 7.0 (GLOVE) IMPLANT
GLOVE BIOGEL PI IND STRL 7.5 (GLOVE) IMPLANT
GLOVE BIOGEL PI INDICATOR 6.5 (GLOVE) ×1
GLOVE BIOGEL PI INDICATOR 7.0 (GLOVE) ×2
GLOVE BIOGEL PI INDICATOR 7.5 (GLOVE) ×1
GLOVE ORTHOPEDIC STR SZ6.5 (GLOVE) ×2 IMPLANT
GLOVE SS BIOGEL STRL SZ 7.5 (GLOVE) ×1 IMPLANT
GLOVE SUPERSENSE BIOGEL SZ 7.5 (GLOVE) ×1
GOWN BRE IMP SLV SIRUS LXLNG (GOWN DISPOSABLE) ×1 IMPLANT
GOWN STRL NON-REIN LRG LVL3 (GOWN DISPOSABLE) ×6 IMPLANT
KIT BASIN OR (CUSTOM PROCEDURE TRAY) ×2 IMPLANT
KIT ROOM TURNOVER OR (KITS) ×2 IMPLANT
NEEDLE 22X1 1/2 (OR ONLY) (NEEDLE) IMPLANT
NS IRRIG 1000ML POUR BTL (IV SOLUTION) ×4 IMPLANT
PACK CAROTID (CUSTOM PROCEDURE TRAY) ×2 IMPLANT
PAD ARMBOARD 7.5X6 YLW CONV (MISCELLANEOUS) ×4 IMPLANT
PATCH HEMASHIELD 8X75 (Vascular Products) ×1 IMPLANT
SHUNT CAROTID BYPASS 10 (VASCULAR PRODUCTS) ×1 IMPLANT
SHUNT CAROTID BYPASS 12FRX15.5 (VASCULAR PRODUCTS) IMPLANT
SPECIMEN JAR SMALL (MISCELLANEOUS) ×2 IMPLANT
STRIP CLOSURE SKIN 1/2X4 (GAUZE/BANDAGES/DRESSINGS) ×2 IMPLANT
SUT ETHILON 3 0 PS 1 (SUTURE) IMPLANT
SUT PROLENE 6 0 CC (SUTURE) ×4 IMPLANT
SUT VIC AB 3-0 SH 27 (SUTURE) ×4
SUT VIC AB 3-0 SH 27X BRD (SUTURE) ×2 IMPLANT
SUT VICRYL 4-0 PS2 18IN ABS (SUTURE) ×2 IMPLANT
SYR CONTROL 10ML LL (SYRINGE) IMPLANT
TOWEL OR 17X24 6PK STRL BLUE (TOWEL DISPOSABLE) ×2 IMPLANT
TOWEL OR 17X26 10 PK STRL BLUE (TOWEL DISPOSABLE) ×2 IMPLANT
WATER STERILE IRR 1000ML POUR (IV SOLUTION) ×2 IMPLANT

## 2012-02-09 NOTE — Plan of Care (Signed)
Problem: Consults Goal: Diagnosis CEA/CES/AAA Stent Carotid Endarterectomy (CEA)     

## 2012-02-09 NOTE — Transfer of Care (Signed)
Immediate Anesthesia Transfer of Care Note  Patient: [name]  Procedure(s) Performed: Procedure(s) (LRB): ENDARTERECTOMY CAROTID (Left)  Patient Location: PACU  Anesthesia Type: General  Level of Consciousness: awake, alert  and oriented  Airway & Oxygen Therapy: Patient Spontanous Breathing and Patient connected to nasal cannula oxygen  Post-op Assessment: Report given to PACU RN and Post -op Vital signs reviewed and stable  Post vital signs: Reviewed and stable  Complications: No apparent anesthesia complications

## 2012-02-09 NOTE — OR Nursing (Signed)
Bilateral arms and legs, (left foot, only) evaluated for strength, all were equal pre and post procedure. Patient with previous right BKA.  Molly Maduro, RN

## 2012-02-09 NOTE — H&P (View-Only) (Signed)
Patient has today for evaluation of extracranial cerebrovascular occlusive disease. He has not had any stroke amaurosis fugax or transient ischemic attack. He was found to have carotid bruit and underwent duplex revealing a severe left and moderate right carotid stenosis. He has a long history of diabetes and cigarette smoking and hypertension. He denies any cardiac difficulty. 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 He denies any claudication type symptoms on the left.  Past Medical History  Diagnosis Date  . Diverticulosis 03/04/10  . Peripheral vascular disease, unspecified   . Undiagnosed cardiac murmurs   . Tobacco use disorder   . Other symptoms involving cardiovascular system   . Other and unspecified hyperlipidemia   . Unspecified essential hypertension   . Type II or unspecified type diabetes mellitus without mention of complication, not stated as uncontrolled   . Ulcer   . CHF (congestive heart failure)   . Carotid artery occlusion     History  Substance Use Topics  . Smoking status: Current Everyday Smoker -- 2.0 packs/day    Types: Cigarettes  . Smokeless tobacco: Not on file  . Alcohol Use: No    History reviewed. No pertinent family history.  Allergies  Allergen Reactions  . Iodine     IVP dye  . Sulfa Antibiotics     Current outpatient prescriptions:amLODipine (NORVASC) 10 MG tablet, Take 10 mg by mouth daily.  , Disp: , Rfl: ;  aspirin 325 MG tablet, Take 325 mg by mouth daily.  , Disp: , Rfl: ;  atenolol (TENORMIN) 50 MG tablet, Take 50 mg by mouth 2 (two) times daily., Disp: , Rfl: ;  Azilsartan Medoxomil (EDARBI) 80 MG TABS, Take by mouth 1 dose over 46 hours.  , Disp: , Rfl: ;  furosemide (LASIX) 40 MG tablet, Take 40 mg by mouth as needed. , Disp: , Rfl:  glipiZIDE  (GLUCOTROL) 10 MG tablet, Take 10 mg by mouth. One qam, 1/2 qpm , Disp: , Rfl: ;  fenofibrate 160 MG tablet, Take 160 mg by mouth daily.  , Disp: , Rfl: ;  nebivolol (BYSTOLIC) 10 MG tablet, Take 10 mg by mouth daily.  , Disp: , Rfl:   BP 126/65  Pulse 84  Resp 18  Ht 5' 6" (1.676 m)  Wt 189 lb 9.6 oz (86.002 kg)  BMI 30.60 kg/m2  Body mass index is 30.60 kg/(m^2).       Review of systems is positive for shortness of breath with exertion swelling in his left leg Neurologic he does report some numbness in weakness in his extremities otherwise negative  Visible exam well-developed well-nourished white male appearing stated age of 60 Carotid arteries reveal harsh bruits bilaterally Chest clear without wheezes bilaterally Heart regular rate and rhythm without murmur Abdomen moderately obese no masses and no tenderness noted Neurologically grossly intact Skin without ulcers or rashes Does have a right below-the-knee education with prosthesis  Drug duplex today reveals severe greater than 80% stenosis in the left internal carotid and 60-79% stenosis on the right  Impression and plan severe stenosis in left internal carotid artery. He is right-handed. I have recommended endarterectomy for reduction of stroke risk. The procedure and potential risk for stroke and cranial nerve injury. He understands and wished to proceed. We'll schedule this at his convenience on 01/24/2012  

## 2012-02-09 NOTE — Progress Notes (Signed)
Patient stated he started Mupirocin ointment on 02/04/12, but did not use it twice per day. Mupirocin applied to bilateral nostrils DOS.

## 2012-02-09 NOTE — Op Note (Signed)
[facility]  Patient name: [name] MRN: [id number] DOB: September 15, 1950 Sex: male  8/21/20132 Pre-operative Diagnosis: Asymptomatic left carotid stenosis Post-operative diagnosis:  Same Surgeon:  Larina Earthly, M.D. Assistants:  Collins,PA Procedure:    left carotid Endarterectomy with Dacron patch angioplasty Anesthesia:  General Blood Loss:  See anesthesia record Specimens:  Carotid Plaque to pathology  Indications for surgery:  Asymptomatic L ICA stenosis  Procedure in detail:  The patient was taken to the operating and placed in the supine position. The neck was prepped and draped in the usual sterile fashion. An incision was made anterior to the sternocleidomastoid muscle and continued with electrocautery through the platysma muscle. The muscle was retracted posteriorly and the carotid sheath was opened. The facial vein was ligated with 2-0 silk ties and divided. The common carotid artery was encircled with an umbilical tape and Rummel tourniquet. Dissection was continued onto the carotid bifurcation. The superior thyroid artery was controlled with a 2-0 silk Potts tie. The external carotid organ was encircled with a vessel loop and the internal carotid was encircled with umbilical tape and Rummel tourniquet. The hypoglossal and vagus nerves were identified and preserved.  The patient was given systemic heparinization. After adequate circulation time, the internal,external and common carotid arteries were occluded. The common carotid was opened with an 11 blade and the arteriotomy was continued with Potts scissors onto the internal carotid artery. A 10 shunt was passed up the internal carotid artery, allowed to back bleed, and then passed down the common carotid artery. The shunt was secured with Rummel tourniquet. The endarterectomy was begun on the common carotid artery  plaque was divided proximally with Potts scissors. The endarterectomy was continued  onto the carotid bifurcation. The external carotid was endarterectomized by eversion technique and the internal carotid artery was endarterectomized in an open fashion. Remaining debris was removed from the endarterectomy plane. A Dacron patch was brought to the field and sewn as a patch angioplasty. Prior to completing the anastomosis, the shunt was removed and the usual flushing maneuvers were undertaken. The anastomosis was then completed and flow was restored first to the external and then the internal carotid artery. Excellent flow characteristics were noted with hand-held Doppler in the internal and external carotid arteries.  The patient was given protamine to reverse the heparin. Hemostasis was obtained with electrocautery. The wounds were irrigated with saline. The wound was closed by first reapproximating the sternocleidomastoid muscle over the carotid artery with interrupted 3-0 Vicryl sutures. Next, the platysma was closed with a running 3-0 Vicryl suture. The skin was closed with a 4-0 subcuticular Vicryl suture. Benzoin and Steri-Strips were applied to the incision. A sterile dressing was placed over the incision. All sponge and needle counts were correct. The patient was awakened in the operating room, neurologically intact. They were transferred to the PACU in stable condition.   Disposition:  To PACU in stable condition,neurologically intact     Larina Earthly, M.D. [facility] [place] Office: [phone] Pager:  [phone]

## 2012-02-09 NOTE — Progress Notes (Signed)
Utilization review completed.  

## 2012-02-09 NOTE — Anesthesia Preprocedure Evaluation (Addendum)
Anesthesia Evaluation  Patient identified by MRN, date of birth, ID band Patient awake    Reviewed: Allergy & Precautions, H&P , NPO status , Patient's Chart, lab work & pertinent test results  History of Anesthesia Complications Negative for: history of anesthetic complications  Airway Mallampati: II TM Distance: >3 FB Neck ROM: full    Dental  (+) Edentulous Lower, Edentulous Upper and Dental Advisory Given   Pulmonary shortness of breath, Current Smoker,          Cardiovascular hypertension, Pt. on medications + CAD and +CHF + Valvular Problems/Murmurs     Neuro/Psych  Neuromuscular disease    GI/Hepatic Neg liver ROS, GERD-  ,  Endo/Other  Type 2, Oral Hypoglycemic Agents  Renal/GU Renal InsufficiencyRenal disease     Musculoskeletal   Abdominal   Peds  Hematology   Anesthesia Other Findings   Reproductive/Obstetrics                         Anesthesia Physical Anesthesia Plan  ASA: III  Anesthesia Plan: General   Post-op Pain Management:    Induction: Intravenous  Airway Management Planned: Oral ETT  Additional Equipment: Arterial line  Intra-op Plan:   Post-operative Plan: Extubation in OR  Informed Consent: I have reviewed the patients History and Physical, chart, labs and discussed the procedure including the risks, benefits and alternatives for the proposed anesthesia with the patient or authorized representative who has indicated his/her understanding and acceptance.     Plan Discussed with: CRNA and Surgeon  Anesthesia Plan Comments:         Anesthesia Quick Evaluation

## 2012-02-09 NOTE — Preoperative (Signed)
Beta Blockers   Reason not to administer Beta Blockers:Not Applicable 

## 2012-02-09 NOTE — Interval H&P Note (Signed)
History and Physical Interval Note:  02/09/2012 8:29 AM  [name]  has presented today for surgery, with the diagnosis of Left Internal Carotid Artery Stenosis  The various methods of treatment have been discussed with the patient and family. After consideration of risks, benefits and other options for treatment, the patient has consented to  Procedure(s) (LRB): ENDARTERECTOMY CAROTID (Left) as a surgical intervention .  The patient's history has been reviewed, patient examined, no change in status, stable for surgery.  I have reviewed the patient's chart and labs.  Questions were answered to the patient's satisfaction.     [name]

## 2012-02-09 NOTE — Progress Notes (Signed)
With initial assessment pt reported numbness along left jaw line. Face symmetric, tongue midline, grips equal, pupils equal and pt otherwise neuro intact. On call MD Hart Rochester notified, no new orders. Pt states that numbness is subsiding and "getting a little better. Will continue to monitor.

## 2012-02-09 NOTE — Anesthesia Postprocedure Evaluation (Signed)
Anesthesia Post Note  Patient: [name]  Procedure(s) Performed: Procedure(s) (LRB): ENDARTERECTOMY CAROTID (Left)  Anesthesia type: General  Patient location: PACU  Post pain: Pain level controlled and Adequate analgesia  Post assessment: Post-op Vital signs reviewed, Patient's Cardiovascular Status Stable, Respiratory Function Stable, Patent Airway and Pain level controlled  Last Vitals:  Filed Vitals:   02/09/12 1145  BP:   Pulse: 83  Temp:   Resp: 14    Post vital signs: Reviewed and stable  Level of consciousness: awake, alert  and oriented  Complications: No apparent anesthesia complications

## 2012-02-10 ENCOUNTER — Encounter (HOSPITAL_COMMUNITY): Payer: Self-pay | Admitting: Vascular Surgery

## 2012-02-10 LAB — GLUCOSE, CAPILLARY

## 2012-02-10 LAB — CBC
HCT: 27.9 % — ABNORMAL LOW (ref 39.0–52.0)
Hemoglobin: 8.7 g/dL — ABNORMAL LOW (ref 13.0–17.0)
MCV: 74.6 fL — ABNORMAL LOW (ref 78.0–100.0)
WBC: 10.7 10*3/uL — ABNORMAL HIGH (ref 4.0–10.5)

## 2012-02-10 LAB — HEMOGLOBIN A1C
Hgb A1c MFr Bld: 5.9 % — ABNORMAL HIGH (ref <5.7)
Mean Plasma Glucose: 123 mg/dL — ABNORMAL HIGH (ref <117)

## 2012-02-10 LAB — BASIC METABOLIC PANEL
BUN: 15 mg/dL (ref 6–23)
Chloride: 101 mEq/L (ref 96–112)
Creatinine, Ser: 1.37 mg/dL — ABNORMAL HIGH (ref 0.50–1.35)
Glucose, Bld: 98 mg/dL (ref 70–99)
Potassium: 4.2 mEq/L (ref 3.5–5.1)

## 2012-02-10 MED ORDER — NAPROXEN 500 MG PO TABS
500.0000 mg | ORAL_TABLET | ORAL | Status: AC
  Administered 2012-02-10: 500 mg via ORAL
  Filled 2012-02-10: qty 1

## 2012-02-10 NOTE — Progress Notes (Signed)
Pt discharge instructions given to pt and wife. Verbalized understanding. Forms signed and copy given. PIV removed.

## 2012-02-10 NOTE — Discharge Summary (Signed)
[facility] Discharge Summary   Patient ID:  [name] MRN: [id number] DOB/AGE: 03-14-1951 61 y.o.  Admit date: 02/09/2012 Discharge date: 02/10/2012 Date of Surgery: 02/09/2012 Surgeon: Surgeon(s): Larina Earthly, MD  Admission Diagnosis: Left Internal Carotid Artery Stenosis  Discharge Diagnoses:  Left Internal Carotid Artery Stenosis  Secondary Diagnoses: Past Medical History  Diagnosis Date  . Diverticulosis 03/04/10  . Peripheral vascular disease, unspecified   . Undiagnosed cardiac murmurs   . Other symptoms involving cardiovascular system   . HLD (hyperlipidemia)   . HTN (hypertension)   . Type II or unspecified type diabetes mellitus without mention of complication, not stated as uncontrolled   . Ulcer   . Carotid artery occlusion   . Umbilical hernia now    has not been repaired  . Blood transfusion   . AVM (arteriovenous malformation) of colon with hemorrhage   . Gastritis   . Internal hemorrhoids   . Right carotid bruit   . Shortness of breath     noted /w low Hgb  . GERD (gastroesophageal reflux disease)   . Chronic kidney disease     renal failure /w osteomyelitis- 2010  . Arthritis     back  . Anemia     8/3,4,5- blood transfusion- [facility], colonoscopy- done & found 2 areas of bleeding   . Tobacco use disorder   . Anxiety   . CHF (congestive heart failure)     3 yrs ago    Procedure(s): ENDARTERECTOMY CAROTID  Discharged Condition: good  HPI:  Patient has today for evaluation of extracranial cerebrovascular occlusive disease. He has not had any stroke amaurosis fugax or transient ischemic attack. He was found to have carotid bruit and underwent duplex revealing a severe left and moderate right carotid stenosis. He has a long history of diabetes and cigarette smoking and hypertension. He denies any cardiac difficulty. He is known to me from a extensive bilateral common superficial femoral and profundus femoral endarterectomies and  patch angioplasty in 2003 he did have many years later undergo a right amputation with nonhealing wounds of his right foot. He denies any claudication type symptoms on the left.    Hospital Course:  [name] is a 61 y.o. male is S/P Left Procedure(s): ENDARTERECTOMY CAROTID Extubated: POD # 0 Post-op wounds clean, dry, intact or healing well Pt. Ambulating, voiding and taking PO diet without difficulty. Pt pain controlled with PO pain meds. Labs as below Complications:none  Consults:     Significant Diagnostic Studies: CBC Lab Results  Component Value Date   WBC 10.7* 02/10/2012   HGB 8.7* 02/10/2012   HCT 27.9* 02/10/2012   MCV 74.6* 02/10/2012   PLT 309 02/10/2012    BMET    Component Value Date/Time   NA 135 02/10/2012 0444   K 4.2 02/10/2012 0444   CL 101 02/10/2012 0444   CO2 24 02/10/2012 0444   GLUCOSE 98 02/10/2012 0444   BUN 15 02/10/2012 0444   CREATININE 1.37* 02/10/2012 0444   CALCIUM 9.1 02/10/2012 0444   GFRNONAA 54* 02/10/2012 0444   GFRAA 63* 02/10/2012 0444   COAG Lab Results  Component Value Date   INR 1.03 02/03/2012   INR 1.02 01/20/2012   INR 1.12 01/19/2012     Disposition:  Discharge to :Home Discharge Orders    Future Orders Please Complete By Expires   Resume previous diet      Driving Restrictions      Comments:   No  driving for one weeks   Call MD for:  temperature >100.5      Call MD for:  redness, tenderness, or signs of infection (pain, swelling, bleeding, redness, odor or green/yellow discharge around incision site)      Call MD for:  severe or increased pain, loss or decreased feeling  in affected limb(s)      may wash over wound with mild soap and water      CAROTID Sugery: Call MD for difficulty swallowing or speaking; weakness in arms or legs that is a new symtom; severe headache.  If you have increased swelling in the neck and/or  are having difficulty breathing, CALL 911      Discharge patient      Comments:   Discharge pt to  home      [name], [name]  Home Medication Instructions JYN:[id number]   Printed on:02/10/12 0738  Medication Information                    glipiZIDE (GLUCOTROL) 10 MG tablet Take 10 mg by mouth every morning.            aspirin 325 MG tablet Take 325 mg by mouth every morning.            amLODipine (NORVASC) 10 MG tablet Take 10 mg by mouth daily before breakfast.            furosemide (LASIX) 40 MG tablet Take 40 mg by mouth as needed.            Azilsartan Medoxomil (EDARBI) 80 MG TABS Take 1 tablet by mouth daily before breakfast.            atenolol (TENORMIN) 100 MG tablet Take 50 mg by mouth 2 (two) times daily.           pantoprazole (PROTONIX) 40 MG tablet Take 1 tablet (40 mg total) by mouth 2 (two) times daily before a meal.           iron polysaccharides (NIFEREX) 150 MG capsule Take 1 capsule (150 mg total) by mouth 2 (two) times daily.           naproxen sodium (ANAPROX) 220 MG tablet Take 220 mg by mouth 2 (two) times daily as needed.            Verbal and written Discharge instructions given to the patient. Wound care per Discharge AVS  F/U in 2 weeks with Dr. Arbie Cookey Signed: Thomasena Edis, Bryony Kaman [facility] 02/10/2012, 7:38 AM

## 2012-02-10 NOTE — Progress Notes (Signed)
Subjective: Interval History: none.. Comfortable. Wants to go home  Objective: Vital signs in last 24 hours: Temp:  [97 F (36.1 C)-98.3 F (36.8 C)] 98 F (36.7 C) (08/22 0342) Pulse Rate:  [73-92] 73  (08/21 2345) Resp:  [9-25] 16  (08/21 2345) BP: (114-170)/(43-64) 130/48 mmHg (08/21 2345) SpO2:  [83 %-100 %] 97 % (08/21 2345) Arterial Line BP: (113-190)/(46-76) 152/64 mmHg (08/21 2345) Weight:  [182 lb 1.6 oz (82.6 kg)] 182 lb 1.6 oz (82.6 kg) (08/21 1358)  Intake/Output from previous day: 08/21 0701 - 08/22 0700 In: 2540 [P.O.:240; I.V.:2250; IV Piggyback:50] Out: 385 [Urine:225; Blood:160] Intake/Output this shift: Total I/O In: 50 [IV Piggyback:50] Out: 225 [Urine:225]  Dressing removed from left neck. Wound healing nicely. No hematoma. Grossly intact neurologically  Lab Results:  [facility] 02/10/12 0444  WBC 10.7*  HGB 8.7*  HCT 27.9*  PLT 309   BMET  Basename 02/10/12 0444 02/09/12 0715  NA 135 137  K 4.2 5.2*  CL 101 103  CO2 24 28  GLUCOSE 98 140*  BUN 15 11  CREATININE 1.37* 1.27  CALCIUM 9.1 9.2    Studies/Results: Dg Chest 2 View  01/19/2012  *RADIOLOGY REPORT*  Clinical Data: Preadmission  CHEST - 2 VIEW  Comparison: 03/13/2008  Findings: Cardiomediastinal silhouette is stable.  No acute infiltrate or pleural effusion.  No pulmonary edema.  Stable degenerative changes thoracic spine.  IMPRESSION: No active disease.  No significant change.  Original Report Authenticated By: Natasha Mead, M.D.   Anti-infectives: Anti-infectives     Start     Dose/Rate Route Frequency Ordered Stop   02/09/12 2030   cefUROXime (ZINACEF) 1.5 g in dextrose 5 % 50 mL IVPB        1.5 g 100 mL/hr over 30 Minutes Intravenous Every 12 hours 02/09/12 1224 02/10/12 2029   02/08/12 1028   cefUROXime (ZINACEF) 1.5 g in dextrose 5 % 50 mL IVPB        1.5 g 100 mL/hr over 30 Minutes Intravenous 30 min pre-op 02/08/12 1028 02/09/12 0830          Assessment/Plan: s/p  Procedure(s) (LRB): ENDARTERECTOMY CAROTID (Left) Stable postop day 1 from carotid endarterectomy. Discharged to home today with followup in 2-3 weeks   LOS: 1 day   Tasmia Blumer 02/10/2012, 6:43 AM

## 2012-02-18 ENCOUNTER — Encounter: Payer: Self-pay | Admitting: Vascular Surgery

## 2012-02-22 ENCOUNTER — Ambulatory Visit (INDEPENDENT_AMBULATORY_CARE_PROVIDER_SITE_OTHER): Payer: Medicaid Other | Admitting: Vascular Surgery

## 2012-02-22 ENCOUNTER — Encounter: Payer: Self-pay | Admitting: Vascular Surgery

## 2012-02-22 VITALS — BP 178/80 | HR 102 | Resp 20 | Ht 63.0 in | Wt 181.0 lb

## 2012-02-22 DIAGNOSIS — I6529 Occlusion and stenosis of unspecified carotid artery: Secondary | ICD-10-CM

## 2012-02-22 DIAGNOSIS — Z48812 Encounter for surgical aftercare following surgery on the circulatory system: Secondary | ICD-10-CM

## 2012-02-22 NOTE — Progress Notes (Signed)
The patient has today for followup of his left carotid endarterectomy for severe asymptomatic left carotid stenosis surgery was on 02/09/2012. He did well and was discharged home on postoperative day #1. His surgery has been delayed due to to finding of severe anemia on his initial preoperative studies. He is being treated for peptic ulcer disease as the cause of this.  Physical exam: Well-developed white male in no acute distress. He is intact neurologically. His neck incision is well healed and he has no bruits bilaterally.  Impression and plan: Stable followup after her left carotid endarterectomy. He will resume his full activities and we will see him in 6 months with repeat carotid duplex. He'll notify should he develop any wound issues or neurologic deficits.

## 2012-02-22 NOTE — Addendum Note (Signed)
Addended by: Sharee Pimple on: 02/22/2012 10:50 AM   Modules accepted: Orders

## 2012-04-21 ENCOUNTER — Encounter: Payer: Self-pay | Admitting: Gastroenterology

## 2012-05-24 ENCOUNTER — Encounter: Payer: Self-pay | Admitting: Gastroenterology

## 2012-05-25 ENCOUNTER — Ambulatory Visit (INDEPENDENT_AMBULATORY_CARE_PROVIDER_SITE_OTHER): Payer: Medicaid Other | Admitting: Gastroenterology

## 2012-05-25 ENCOUNTER — Encounter: Payer: Self-pay | Admitting: Gastroenterology

## 2012-05-25 VITALS — BP 186/89 | HR 96 | Temp 97.4°F | Ht 66.0 in | Wt 183.8 lb

## 2012-05-25 DIAGNOSIS — D649 Anemia, unspecified: Secondary | ICD-10-CM

## 2012-05-25 LAB — CBC WITH DIFFERENTIAL/PLATELET
Basophils Relative: 1 % (ref 0–1)
Eosinophils Relative: 7 % — ABNORMAL HIGH (ref 0–5)
HCT: 39.1 % (ref 39.0–52.0)
Hemoglobin: 12.9 g/dL — ABNORMAL LOW (ref 13.0–17.0)
MCH: 28.4 pg (ref 26.0–34.0)
MCHC: 33 g/dL (ref 30.0–36.0)
MCV: 86.1 fL (ref 78.0–100.0)
Monocytes Absolute: 0.6 10*3/uL (ref 0.1–1.0)
Monocytes Relative: 7 % (ref 3–12)
Neutro Abs: 5.7 10*3/uL (ref 1.7–7.7)

## 2012-05-25 MED ORDER — PANTOPRAZOLE SODIUM 40 MG PO TBEC: 40.0000 mg | DELAYED_RELEASE_TABLET | Freq: Two times a day (BID) | ORAL | Status: DC

## 2012-05-25 NOTE — Progress Notes (Signed)
Subjective:    Patient ID: [name], male    DOB: 1950-07-13, 61 y.o.   MRN: [id number]  PCP: Christell Constant  HPI SINCE LAST SEEN: HAS LEFT CEA. DOING WELL. PT DENIES FEVER, CHILLS, BRBPR, nausea, vomiting, melena, diarrhea, constipation, abd pain, problems swallowing, heartburn or indigestion.  Past Medical History  Diagnosis Date  . Diverticulosis 03/04/10  . Peripheral vascular disease, unspecified   . Undiagnosed cardiac murmurs   . Other symptoms involving cardiovascular system   . HLD (hyperlipidemia)   . HTN (hypertension)   . Type II or unspecified type diabetes mellitus without mention of complication, not stated as uncontrolled   . Ulcer   . Carotid artery occlusion   . Umbilical hernia now    has not been repaired  . Blood transfusion   . AVM (arteriovenous malformation) of colon with hemorrhage   . Gastritis   . Internal hemorrhoids   . Right carotid bruit   . Shortness of breath     noted /w low Hgb  . GERD (gastroesophageal reflux disease)   . Chronic kidney disease     renal failure /w osteomyelitis- 2010  . Arthritis     back  . Anemia     8/3,4,5- blood transfusion- [facility], colonoscopy- done & found 2 areas of bleeding   . Tobacco use disorder   . Anxiety   . CHF (congestive heart failure)     3 yrs ago    Past Surgical History  Procedure Date  . Colonoscopy 03/04/2010    Dr. Alysia Penna AMV without mention of ablation, scattered diverticula, internal hemorrhoids  . Bilateral common superficial femoral and profudus artery endarterectomies     2003    Dr. Tawanna Cooler Early  . Esophagogastroduodenoscopy 03/2000    Dr. Barbaraann Rondo gastritis, H.Pylori gastritis, ?treated  . Esophagogastroduodenoscopy 05/2006    Dr. Jeanie Sewer recurrent GI bleed. antral gastritis, single gastric AVM s/p APC, CLO results?  . Colonoscopy 11/2004    Dr. Georgeann Oppenheim  . 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 Colonoscopy 01/22/2012   NUR: Two cecal AV malformations without stigmata of bleed with maximal.meter of 8-10 mm. Both of these are ablated with argon plasma coagulator./ Small external hemorrhoids  . Esophagogastroduodenoscopy 01/21/2012    SLF: MILD TO MAODERATE Gastritis/ SLOW GIB LIKEY DUE TO PT BEING ON ASA ND SUBSEQUENT BLOOD/ LOSS FROM AVMS IN STOMACH AND COLON AS WELL AS GASTRITIS  . Enteroscopy 01/21/2012    Procedure: ENTEROSCOPY;  Surgeon: West Bali, MD;  Location: [facility] SUITE;  Service: Endoscopy;;  . No past surgeries   . Vascular surgery     rt bka and rt 1st toe amputation  . Below knee leg amputation     2010, wears prosthesis   . Endarterectomy 02/09/2012    Procedure: ENDARTERECTOMY CAROTID;  Surgeon: Larina Earthly, MD;  Location: [facility] OR;  Service: Vascular;  Laterality: Left;    Allergies  Allergen Reactions  . Iodine Hives    IVP dye  . Sulfa Antibiotics Other (See Comments)    Unknown     Current Outpatient Prescriptions  Medication Sig Dispense Refill  . amLODipine (NORVASC) 10 MG tablet Take 10 mg by mouth daily before breakfast.       . aspirin 325 MG tablet Take 325 mg by mouth every morning.       Marland Kitchen atenolol (TENORMIN) 100 MG tablet Take 50 mg by mouth 2 (two) times daily.      Marland Kitchen  Azilsartan Medoxomil (EDARBI) 80 MG TABS Take 1 tablet by mouth daily before breakfast.       . furosemide (LASIX) 40 MG tablet Take 40 mg by mouth as needed.       Marland Kitchen glipiZIDE (GLUCOTROL) 10 MG tablet Take 10 mg by mouth every morning.       . iron polysaccharides (NIFEREX) 150 MG capsule Take 1 capsule (150 mg total) by mouth 2 (two) times daily. OFF SINCE 1 MO   . naproxen sodium (ANAPROX) 220 MG tablet Take 220 mg by mouth 2 (two) times daily as needed.  NOT TAKING    .           Review of Systems STILL SMOKING     Objective:   Physical Exam  Vitals reviewed. Constitutional: He is oriented to person, place, and time. He appears well-nourished. No distress.  HENT:  Head: Normocephalic and  atraumatic.  Mouth/Throat: No oropharyngeal exudate.  Eyes: Pupils are equal, round, and reactive to light. No scleral icterus.  Neck: Normal range of motion. Neck supple.       INCISION LEFT NECK WELL HEALED  Cardiovascular: Normal rate, regular rhythm and normal heart sounds.   Pulmonary/Chest: Effort normal and breath sounds normal. No respiratory distress.  Abdominal: Soft. Bowel sounds are normal. He exhibits no distension. There is no tenderness.  Musculoskeletal: He exhibits no edema.       R BKA  Lymphadenopathy:    He has no cervical adenopathy.  Neurological: He is alert and oriented to person, place, and time.       NO  NEW FOCAL DEFICITS   Psychiatric: He has a normal mood and affect.          Assessment & Plan:

## 2012-05-25 NOTE — Patient Instructions (Addendum)
GET YOUR BLOOD DRAWN. I WILL CALL YOU WITH THE RESULTS OF YOUR BLOOD COUNT.  YOU SHOULD TAKE PROTONIX EVERY MORNING TO PREVENT ULCERS.  FOLLOW UP IN 6 MOS.

## 2012-05-25 NOTE — Assessment & Plan Note (Signed)
DUE TO AVMS/GASTRITIS. NO S/Sx ACTIVE GIB. LAST HB AUG 2013 8.7. OFF IRON 1 MO.  ENCOURAGED PT TO TAKE PPI. EXPLAINED BENEFITS. CBC TODAY. OPV IN 6 MOS.

## 2012-05-25 NOTE — Progress Notes (Signed)
Faxed to PCP

## 2012-05-31 ENCOUNTER — Telehealth: Payer: Self-pay | Admitting: Gastroenterology

## 2012-05-31 NOTE — Telephone Encounter (Signed)
[name] was calling to see if patient's lab work from Thursday was back yet and to call her at [phone]

## 2012-06-01 NOTE — Progress Notes (Signed)
Reminder in epic to follow up in 6 months °

## 2012-06-02 NOTE — Telephone Encounter (Signed)
Called pt to let him know that Dr. Darrick Penna has been out. She is back at the hospital today, and as soon as she reviews, I will call him. He said he is doing fine.

## 2012-06-02 NOTE — Telephone Encounter (Signed)
Called patient TO DISCUSS RESULTS. SPOKE WITH HIS WIFE. HB IMPROVED. RECHECK CBC/DIFF IN [facility] 2014.

## 2012-06-05 ENCOUNTER — Other Ambulatory Visit: Payer: Self-pay

## 2012-06-05 DIAGNOSIS — D649 Anemia, unspecified: Secondary | ICD-10-CM

## 2012-06-05 NOTE — Telephone Encounter (Signed)
Lab order on file for March 2014.

## 2012-08-21 ENCOUNTER — Encounter: Payer: Self-pay | Admitting: Vascular Surgery

## 2012-08-22 ENCOUNTER — Other Ambulatory Visit: Payer: Medicaid Other

## 2012-08-22 ENCOUNTER — Ambulatory Visit: Payer: Medicaid Other | Admitting: Vascular Surgery

## 2012-09-14 ENCOUNTER — Telehealth: Payer: Self-pay | Admitting: Nurse Practitioner

## 2012-09-14 NOTE — Telephone Encounter (Signed)
appt made

## 2012-09-15 ENCOUNTER — Telehealth: Payer: Self-pay

## 2012-09-15 ENCOUNTER — Encounter: Payer: Self-pay | Admitting: *Deleted

## 2012-09-15 ENCOUNTER — Telehealth: Payer: Self-pay | Admitting: Vascular Surgery

## 2012-09-15 NOTE — Telephone Encounter (Signed)
Wife called to report pt. Recently seen by eye doctor due to "blood in left eye".  Was told by the eye doctor that pt. has plaque in the blood vessels of his right eye, and should see Dr. Arbie Cookey sooner than May.  Questioned if pt. having any s/s of TIA/ Stroke?  States "he gets dizzy when he looks up, and has problems with his left leg, his right leg has been amputated".   Advised wife that pt. missed appt. for follow-up 08/22/12.  States they were taking a sick relative to appts frequently, and missed his f/u w/ Dr. Arbie Cookey.  Advised wife will look into moving appt. To an earlier date.

## 2012-09-15 NOTE — Progress Notes (Signed)
Made reminder note to discuss with patient at appointment

## 2012-09-15 NOTE — Progress Notes (Signed)
Received note from [facility] about patient's diabetic eye exam.  Patient has recurrent subconjunctival hemes.  Few retinal hemorrhages in OD and pos carotid emboli see in OD.  Consider carotid doppler.  Note will be scanned in and appt is scheduled for 09/29/12 with Bennie Pierini, FNP.

## 2012-09-15 NOTE — Telephone Encounter (Signed)
Please schedule pt. To see dr. Arbie Cookey sooner than May if at all possible missed appt. 3/4. Advised per eye md to get in sooner due to plaque in right eye blood vessels.  notified patient of appt. with dr. early on 09-19-12 as per [facility] staff message 09-15-12

## 2012-09-18 ENCOUNTER — Encounter: Payer: Self-pay | Admitting: Vascular Surgery

## 2012-09-19 ENCOUNTER — Ambulatory Visit (INDEPENDENT_AMBULATORY_CARE_PROVIDER_SITE_OTHER): Payer: Medicaid Other | Admitting: Vascular Surgery

## 2012-09-19 ENCOUNTER — Encounter: Payer: Self-pay | Admitting: Vascular Surgery

## 2012-09-19 ENCOUNTER — Other Ambulatory Visit (INDEPENDENT_AMBULATORY_CARE_PROVIDER_SITE_OTHER): Payer: Medicaid Other | Admitting: *Deleted

## 2012-09-19 ENCOUNTER — Encounter: Payer: Self-pay | Admitting: Family Medicine

## 2012-09-19 DIAGNOSIS — I6529 Occlusion and stenosis of unspecified carotid artery: Secondary | ICD-10-CM

## 2012-09-19 DIAGNOSIS — Z48812 Encounter for surgical aftercare following surgery on the circulatory system: Secondary | ICD-10-CM

## 2012-09-19 NOTE — Addendum Note (Signed)
Addended by: Adria Dill L on: 09/19/2012 04:44 PM   Modules accepted: Orders

## 2012-09-19 NOTE — Progress Notes (Signed)
Established Carotid Patient CC: S/P Left CEA with "plaque" seen in right eye per opthamologist Referring physician: Dr Colette Ribas Date of Surgery: 02/09/12 Surgeon: Ilean Skill  History of Present Illness  [name] is a 62 y.o. male who has known carotid stenosis. Pt was seen by His Opthamologist who noted Plaque in the ocular vessels on exam.  He states he has been on tricor but stopped taking it when prescription ran out. He takes 165mg  ASA daily.  Patient has had previous Left CEA in August of last year. He C/O some numbness in chin area which is improving.  Patient has Negative history of TIA or stroke symptom.  The patient denies amaurosis fugax or monocular blindness.  The patient  denies facial drooping.  Pt. denies hemiplegia.  The patient denies receptive or expressive aphasia.  Pt. denies weakness in BUE/BLE  Non-Invasive Vascular Imaging CAROTID DUPLEX 09/19/2012  09/19/2012 Right ICA 60 - 79 % stenosis Left ICA 20 - 39 % stenosis  Previous carotid studies demonstrated: RICA 60 - 79 % stenosis, LICA 80 - 99 % stenosis. 12/2011 prior to surgery   Past Medical History  Diagnosis Date  . Diverticulosis 03/04/10  . Peripheral vascular disease, unspecified   . Undiagnosed cardiac murmurs   . Other symptoms involving cardiovascular system   . HLD (hyperlipidemia)   . HTN (hypertension)   . Type II or unspecified type diabetes mellitus without mention of complication, not stated as uncontrolled   . Ulcer   . Carotid artery occlusion   . Umbilical hernia now    has not been repaired  . Blood transfusion   . AVM (arteriovenous malformation) of colon with hemorrhage   . Gastritis   . Internal hemorrhoids   . Right carotid bruit   . Shortness of breath     noted /w low Hgb  . GERD (gastroesophageal reflux disease)   . Chronic kidney disease     renal failure /w osteomyelitis- 2010  . Arthritis     back  . Anemia     8/3,4,5- blood transfusion- [facility], colonoscopy- done & found 2  areas of bleeding   . Tobacco use disorder   . Anxiety   . CHF (congestive heart failure)     3 yrs ago    Social History History  Substance Use Topics  . Smoking status: Current Every Day Smoker -- 1.50 packs/day for 50 years    Types: Cigarettes  . Smokeless tobacco: Former Neurosurgeon    Quit date: 02/08/2012  . Alcohol Use: No    Family History Family History  Problem Relation Age of Onset  . Colon cancer Brother 50    deceased  . Lung cancer Sister     deceased, three primary cancers: lung, esophageal, lymphoma.   . Cancer Father     gallbladder  . Cancer Brother     unknown primary  . Cancer Sister     unknown primary    Review of Systems : [x]  Positive   [ ]  Denies  General:[ ]  Weight loss,  [ ]  Weight gain, [ ]  Loss of appetite, [ ]  Fever, [ ]  chills  Neurologic: [ ]  Dizziness, [ ]  Blackouts, [ ]  Headaches, [ ]  Seizure [ ]  Stroke, [ ]  "Mini stroke", [ ]  Slurred speech, [ ]  Temporary blindness;  [ ] weakness,  Ear/Nose/Throat: [ ]  Change in hearing, [ ]  Nose bleeds, [ ]  Hoarseness  Vascular:[ ]  Pain in legs with walking, [ ]  Pain in feet while lying  flat , [ ]   Non-healing ulcer, [ ]  Blood clot in vein,    Pulmonary: [ ]  Home oxygen, [ ]   Productive cough, [ ]  Bronchitis, [ ]  Coughing up blood,  [ ]  Asthma, [ ]  Wheezing  Musculoskeletal:  [ ]  Arthritis, [ ]  Joint pain, [ ]  low back pain  Cardiac: [ ]  Chest pain, [ ]  Shortness of breath when lying flat, [ ]  Shortness of breath with exertion, [ ]  Palpitations, [ ]  Heart murmur, [ ]   Atrial fibrillation  Hematologic:[ ]  Easy Bruising, [ ]  Anemia; [ ]  Hepatitis  Psychiatric: [ ]   Depression, [ ]  Anxiety   Gastrointestinal: [ ]  Black stool, [ ]  Blood in stool, [ ]  Peptic ulcer disease,  [ ]  Gastroesophageal Reflux, [ ]  Trouble swallowing, [ ]  Diarrhea, [ ]  Constipation  Urinary: [ ]  chronic Kidney disease, [ ]  on HD, [ ]  Burning with urination, [ ]  Frequent urination, [ ]  Difficulty urinating;   Skin: [ ]  Rashes, [  ] Wounds   Allergies  Allergen Reactions  . Iodine Hives    IVP dye  . Sulfa Antibiotics Other (See Comments)    Unknown     Current Outpatient Prescriptions  Medication Sig Dispense Refill  . amLODipine (NORVASC) 10 MG tablet Take 10 mg by mouth daily before breakfast.       . aspirin 325 MG tablet Take 325 mg by mouth every morning.       Marland Kitchen atenolol (TENORMIN) 100 MG tablet Take 50 mg by mouth 2 (two) times daily.      . Azilsartan Medoxomil (EDARBI) 80 MG TABS Take 1 tablet by mouth daily before breakfast.       . furosemide (LASIX) 40 MG tablet Take 40 mg by mouth as needed.       Marland Kitchen glipiZIDE (GLUCOTROL) 10 MG tablet Take 10 mg by mouth every morning.       . iron polysaccharides (NIFEREX) 150 MG capsule Take 1 capsule (150 mg total) by mouth 2 (two) times daily.  60 capsule  1  . naproxen sodium (ANAPROX) 220 MG tablet Take 220 mg by mouth 2 (two) times daily as needed.      . pantoprazole (PROTONIX) 40 MG tablet Take 1 tablet (40 mg total) by mouth 2 (two) times daily before a meal. 1 PO EVERY MORNING  31 tablet  11   No current facility-administered medications for this visit.    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 He walks without cane or walker with prothesis Eyes: PERRLA, left sclera with ruptured Blood vessels  Cardiac: regular Rhythm ,  Negative Murmurs,  Vascular:   RIGHT   LEFT CAROTID BRUITS Positive Negative  RADIAL present present   Musculoskeletal: good and equal strength in BUE/BLE Right BKA with prothesis Neurologic: A&O X 3; Appropriate Affect ; SENSATION ;normal; MOTOR FUNCTION: normal 5/5 strength in all tested muscle groups and no muscle wasting or atrophy Speech is normal  Assessment: [name] is a 62 y.o. male who presents with: Asymptomatic 60 - 79 % Right ICA  Stenosis. He had plaque discovered by his  opthamologist but denies amaurosis or monocular blindness The  ICA stenosis is  Unchanged from previous exam. Pt. is not a candidate for CEA at this time  Plan: Follow-up in 6 months with  Carotid Duplex scan   Pt was given information regarding stroke symptoms and prevention  Clinic MD: TFE  I have examined the patient, reviewed and agree with above. He does have a history of plaque visualized by his doctor. He has no symptoms of amaurosis and no hemispheric problems. He has had no progression in the level of asymptomatic stenosis. I discussed options with the patient and his wife present a degree that we will continue to monitor this with serial duplex followup. He will notify should he develop any difficulty in the interim  EARLY, TODD, MD 09/19/2012 3:50 PM

## 2012-09-22 ENCOUNTER — Other Ambulatory Visit: Payer: Self-pay | Admitting: *Deleted

## 2012-09-22 MED ORDER — VALSARTAN 160 MG PO TABS: 160.0000 mg | ORAL_TABLET | Freq: Every day | ORAL | Status: DC

## 2012-09-22 NOTE — Telephone Encounter (Signed)
LAST VISIT 7/13

## 2012-09-29 ENCOUNTER — Ambulatory Visit: Payer: Self-pay | Admitting: Nurse Practitioner

## 2012-09-29 ENCOUNTER — Other Ambulatory Visit: Payer: Self-pay

## 2012-10-10 ENCOUNTER — Other Ambulatory Visit: Payer: Self-pay

## 2012-10-10 MED ORDER — ATENOLOL 100 MG PO TABS: ORAL_TABLET | ORAL | Status: DC

## 2012-10-13 ENCOUNTER — Telehealth: Payer: Self-pay | Admitting: Nurse Practitioner

## 2012-10-13 NOTE — Telephone Encounter (Signed)
Needs for his prosthetic leg and liners that go over it

## 2012-10-13 NOTE — Telephone Encounter (Signed)
Need to know exactly what he needs so can write RX

## 2012-10-13 NOTE — Telephone Encounter (Signed)
lmtcb alf 2/25

## 2012-10-13 NOTE — Telephone Encounter (Signed)
Please advise 

## 2012-10-18 NOTE — Telephone Encounter (Signed)
This was taken care of in another encounter.

## 2012-10-23 ENCOUNTER — Other Ambulatory Visit: Payer: Self-pay | Admitting: Nurse Practitioner

## 2012-10-23 ENCOUNTER — Other Ambulatory Visit: Payer: Self-pay

## 2012-10-23 ENCOUNTER — Ambulatory Visit: Payer: Self-pay | Admitting: Nurse Practitioner

## 2012-10-23 NOTE — Telephone Encounter (Signed)
Patient last seen in office on 12-27-11. Please advise. Thank  you

## 2012-10-24 ENCOUNTER — Other Ambulatory Visit: Payer: Medicaid Other

## 2012-10-24 ENCOUNTER — Ambulatory Visit: Payer: Medicaid Other | Admitting: Vascular Surgery

## 2012-10-29 ENCOUNTER — Other Ambulatory Visit: Payer: Self-pay | Admitting: Nurse Practitioner

## 2012-10-30 NOTE — Telephone Encounter (Signed)
Patient last seen in office on 12-27-11. Looks like had labs in Dec of 2013 but do not see a Hgb A1C. Please advise

## 2012-11-24 ENCOUNTER — Other Ambulatory Visit: Payer: Self-pay | Admitting: Nurse Practitioner

## 2012-12-05 ENCOUNTER — Encounter: Payer: Self-pay | Admitting: Gastroenterology

## 2012-12-06 ENCOUNTER — Ambulatory Visit (INDEPENDENT_AMBULATORY_CARE_PROVIDER_SITE_OTHER): Payer: Medicaid Other | Admitting: Nurse Practitioner

## 2012-12-06 ENCOUNTER — Telehealth: Payer: Self-pay | Admitting: Nurse Practitioner

## 2012-12-06 ENCOUNTER — Encounter: Payer: Self-pay | Admitting: Nurse Practitioner

## 2012-12-06 ENCOUNTER — Other Ambulatory Visit: Payer: Self-pay

## 2012-12-06 VITALS — BP 184/92 | HR 90 | Temp 97.9°F | Ht 64.0 in | Wt 186.0 lb

## 2012-12-06 DIAGNOSIS — I2510 Atherosclerotic heart disease of native coronary artery without angina pectoris: Secondary | ICD-10-CM

## 2012-12-06 DIAGNOSIS — E1159 Type 2 diabetes mellitus with other circulatory complications: Secondary | ICD-10-CM

## 2012-12-06 DIAGNOSIS — E1142 Type 2 diabetes mellitus with diabetic polyneuropathy: Secondary | ICD-10-CM

## 2012-12-06 DIAGNOSIS — Z125 Encounter for screening for malignant neoplasm of prostate: Secondary | ICD-10-CM

## 2012-12-06 DIAGNOSIS — S88119A Complete traumatic amputation at level between knee and ankle, unspecified lower leg, initial encounter: Secondary | ICD-10-CM

## 2012-12-06 DIAGNOSIS — Z89511 Acquired absence of right leg below knee: Secondary | ICD-10-CM

## 2012-12-06 DIAGNOSIS — I10 Essential (primary) hypertension: Secondary | ICD-10-CM

## 2012-12-06 DIAGNOSIS — E785 Hyperlipidemia, unspecified: Secondary | ICD-10-CM

## 2012-12-06 DIAGNOSIS — D649 Anemia, unspecified: Secondary | ICD-10-CM

## 2012-12-06 DIAGNOSIS — E1149 Type 2 diabetes mellitus with other diabetic neurological complication: Secondary | ICD-10-CM

## 2012-12-06 DIAGNOSIS — N183 Chronic kidney disease, stage 3 (moderate): Secondary | ICD-10-CM

## 2012-12-06 LAB — ANEMIA PANEL 7
%SAT: 19 % — ABNORMAL LOW (ref 20–55)
Ferritin: 17 ng/mL — ABNORMAL LOW (ref 22–322)
Folate: 18.7 ng/mL
HCT: 36.7 % — ABNORMAL LOW (ref 39.0–52.0)
MCH: 29.6 pg (ref 26.0–34.0)
MCHC: 33.5 g/dL (ref 30.0–36.0)
MCV: 88.4 fL (ref 78.0–100.0)
RDW: 14.2 % (ref 11.5–15.5)
TIBC: 236 ug/dL (ref 215–435)

## 2012-12-06 LAB — COMPLETE METABOLIC PANEL WITH GFR
Albumin: 2.8 g/dL — ABNORMAL LOW (ref 3.5–5.2)
Alkaline Phosphatase: 88 U/L (ref 39–117)
BUN: 13 mg/dL (ref 6–23)
Calcium: 8.9 mg/dL (ref 8.4–10.5)
Chloride: 103 mEq/L (ref 96–112)
Creat: 1.59 mg/dL — ABNORMAL HIGH (ref 0.50–1.35)
GFR, Est Non African American: 46 mL/min — ABNORMAL LOW
Glucose, Bld: 213 mg/dL — ABNORMAL HIGH (ref 70–99)
Potassium: 4.6 mEq/L (ref 3.5–5.3)

## 2012-12-06 MED ORDER — VALSARTAN 160 MG PO TABS: 160.0000 mg | ORAL_TABLET | Freq: Every day | ORAL | Status: DC

## 2012-12-06 MED ORDER — FUROSEMIDE 40 MG PO TABS: 40.0000 mg | ORAL_TABLET | ORAL | Status: DC | PRN

## 2012-12-06 MED ORDER — GLIPIZIDE 10 MG PO TABS: 10.0000 mg | ORAL_TABLET | Freq: Every day | ORAL | Status: DC

## 2012-12-06 MED ORDER — ATENOLOL 100 MG PO TABS: ORAL_TABLET | ORAL | Status: DC

## 2012-12-06 MED ORDER — AMLODIPINE BESYLATE 10 MG PO TABS: 10.0000 mg | ORAL_TABLET | Freq: Every day | ORAL | Status: DC

## 2012-12-06 NOTE — Progress Notes (Signed)
Subjective:    Patient ID: [name], male    DOB: August 07, 1950, 62 y.o.   MRN: [id number]  HPI Patient in today for follow-up of multiple medical problems. He has not been seen in awhile. Recently had a carotid endarectomy. He is doing quite well- Diabetes still not under control- Fasting blood sugars have been running over 150. Has had highs as high as 250-300. Doesn't watch his diet very well. Has had recent eye exam. Patient Active Problem List   Diagnosis Date Noted  . Acute blood loss anemia 01/20/2012  . GI bleeding 01/20/2012  . Hyponatremia 01/20/2012  . Acute renal failure 01/20/2012  . Occlusion and stenosis of carotid artery without mention of cerebral infarction 01/11/2012  . CAD (coronary artery disease) 10/07/2010  . CKD (chronic kidney disease) 10/07/2010  . Amputee, below knee 10/07/2010  . Diabetic peripheral neuropathy 10/07/2010  . Anemia 10/07/2010  . Noncompliance 10/07/2010  . DIABETES MELLITUS 11/20/2007  . HYPERLIPIDEMIA 11/20/2007  . TOBACCO USER 11/20/2007  . HYPERTENSION 11/20/2007  . PERIPHERAL VASCULAR DISEASE 11/20/2007  . SYSTOLIC MURMUR 11/20/2007  . CAROTID BRUIT, RIGHT 11/20/2007   Outpatient Encounter Prescriptions as of 12/06/2012  Medication Sig Dispense Refill  . amLODipine (NORVASC) 10 MG tablet Take 10 mg by mouth daily before breakfast.       . aspirin 325 MG tablet Take 325 mg by mouth every morning.       Marland Kitchen atenolol (TENORMIN) 100 MG tablet 1/2 tablet by mouth 2 times daily  30 tablet  0  . Azilsartan Medoxomil (EDARBI) 80 MG TABS Take 1 tablet by mouth daily before breakfast.       . DIOVAN 160 MG tablet TAKE 1 TABLET BY MOUTH DAILY  30 tablet  0  . furosemide (LASIX) 40 MG tablet Take 40 mg by mouth as needed.       Marland Kitchen glipiZIDE (GLUCOTROL) 10 MG tablet TAKE 1 TABLET BY MOUTH EVERY DAY  30 tablet  0  . iron polysaccharides (NIFEREX) 150 MG capsule Take 1 capsule (150 mg total) by mouth 2 (two) times daily.  60 capsule  1  .  naproxen sodium (ANAPROX) 220 MG tablet Take 220 mg by mouth 2 (two) times daily as needed.      . pantoprazole (PROTONIX) 40 MG tablet Take 1 tablet (40 mg total) by mouth 2 (two) times daily before a meal. 1 PO EVERY MORNING  31 tablet  11   No facility-administered encounter medications on file as of 12/06/2012.       Review of Systems     Objective:   Physical Exam  Constitutional: He is oriented to person, place, and time. He appears well-developed and well-nourished.  HENT:  Head: Normocephalic.  Right Ear: External ear normal.  Left Ear: External ear normal.  Nose: Nose normal.  Mouth/Throat: Oropharynx is clear and moist.  Eyes: EOM are normal. Pupils are equal, round, and reactive to light.  Neck: Normal range of motion. Neck supple. No thyromegaly present.  Cardiovascular: Normal rate, regular rhythm, normal heart sounds and intact distal pulses.   No murmur heard. Pulmonary/Chest: Effort normal and breath sounds normal. He has no wheezes. He has no rales.  Abdominal: Soft. Bowel sounds are normal.  Genitourinary: Prostate normal and penis normal.  Musculoskeletal: Normal range of motion.  Right below the knee amputation.  Neurological: He is alert and oriented to person, place, and time.  Skin: Skin is warm and dry.  Psychiatric: He has a normal  mood and affect. His behavior is normal. Judgment and thought content normal.   BP 184/92  Pulse 90  Temp(Src) 97.9 F (36.6 C) (Oral)  Ht 5\' 4"  (1.626 m)  Wt 186 lb (84.369 kg)  BMI 31.91 kg/m2 Results for orders placed in visit on 12/06/12  POCT GLYCOSYLATED HEMOGLOBIN (HGB A1C)      Result Value Range   Hemoglobin A1C 7.7 %           Assessment & Plan:   1. Hypertension   2. Hyperlipidemia   3. Prostate cancer screening   4. Type II or unspecified type diabetes mellitus with peripheral circulatory disorders, uncontrolled(250.72)   5. CAD (coronary artery disease)   6. CKD (chronic kidney disease), stage 3  (moderate)   7. Diabetic peripheral neuropathy   8. Anemia   9. Amputee, below knee, right    Orders Placed This Encounter  Procedures  . COMPLETE METABOLIC PANEL WITH GFR  . NMR Lipoprofile with Lipids  . PSA  . Anemia panel 7  . POCT glycosylated hemoglobin (Hb A1C)   Outpatient Encounter Prescriptions as of 12/06/2012  Medication Sig Dispense Refill  . amLODipine (NORVASC) 10 MG tablet Take 10 mg by mouth daily before breakfast.       . aspirin 325 MG tablet Take 325 mg by mouth every morning.       Marland Kitchen atenolol (TENORMIN) 100 MG tablet 1/2 tablet by mouth 2 times daily  30 tablet  0  . Azilsartan Medoxomil (EDARBI) 80 MG TABS Take 1 tablet by mouth daily before breakfast.       . DIOVAN 160 MG tablet TAKE 1 TABLET BY MOUTH DAILY  30 tablet  0  . furosemide (LASIX) 40 MG tablet Take 40 mg by mouth as needed.       Marland Kitchen glipiZIDE (GLUCOTROL) 10 MG tablet TAKE 1 TABLET BY MOUTH EVERY DAY  30 tablet  0  . iron polysaccharides (NIFEREX) 150 MG capsule Take 1 capsule (150 mg total) by mouth 2 (two) times daily.  60 capsule  1  . naproxen sodium (ANAPROX) 220 MG tablet Take 220 mg by mouth 2 (two) times daily as needed.      . pantoprazole (PROTONIX) 40 MG tablet Take 1 tablet (40 mg total) by mouth 2 (two) times daily before a meal. 1 PO EVERY MORNING  31 tablet  11   No facility-administered encounter medications on file as of 12/06/2012.  HgbA1C slightly elevated- encouraged a stricter diet- if no better next visit will change meds Check blood sugar every morning fasting. Continue current meds Labs pending Diet and exercise encourgaed Follow-up in 3 months  Mary-Margaret Daphine Deutscher, FNP

## 2012-12-06 NOTE — Patient Instructions (Signed)
Health Maintenance, Males A healthy lifestyle and preventative care can promote health and wellness.  Maintain regular health, dental, and eye exams.  Eat a healthy diet. Foods like vegetables, fruits, whole grains, low-fat dairy products, and lean protein foods contain the nutrients you need without too many calories. Decrease your intake of foods high in solid fats, added sugars, and salt. Get information about a proper diet from your caregiver, if necessary.  Regular physical exercise is one of the most important things you can do for your health. Most adults should get at least 150 minutes of moderate-intensity exercise (any activity that increases your heart rate and causes you to sweat) each week. In addition, most adults need muscle-strengthening exercises on 2 or more days a week.   Maintain a healthy weight. The body mass index (BMI) is a screening tool to identify possible weight problems. It provides an estimate of body fat based on height and weight. Your caregiver can help determine your BMI, and can help you achieve or maintain a healthy weight. For adults 20 years and older:  A BMI below 18.5 is considered underweight.  A BMI of 18.5 to 24.9 is normal.  A BMI of 25 to 29.9 is considered overweight.  A BMI of 30 and above is considered obese.  Maintain normal blood lipids and cholesterol by exercising and minimizing your intake of saturated fat. Eat a balanced diet with plenty of fruits and vegetables. Blood tests for lipids and cholesterol should begin at age 20 and be repeated every 5 years. If your lipid or cholesterol levels are high, you are over 50, or you are a high risk for heart disease, you may need your cholesterol levels checked more frequently.Ongoing high lipid and cholesterol levels should be treated with medicines, if diet and exercise are not effective.  If you smoke, find out from your caregiver how to quit. If you do not use tobacco, do not start.  If you  choose to drink alcohol, do not exceed 2 drinks per day. One drink is considered to be 12 ounces (355 mL) of beer, 5 ounces (148 mL) of wine, or 1.5 ounces (44 mL) of liquor.  Avoid use of street drugs. Do not share needles with anyone. Ask for help if you need support or instructions about stopping the use of drugs.  High blood pressure causes heart disease and increases the risk of stroke. Blood pressure should be checked at least every 1 to 2 years. Ongoing high blood pressure should be treated with medicines if weight loss and exercise are not effective.  If you are 45 to 63 years old, ask your caregiver if you should take aspirin to prevent heart disease.  Diabetes screening involves taking a blood sample to check your fasting blood sugar level. This should be done once every 3 years, after age 45, if you are within normal weight and without risk factors for diabetes. Testing should be considered at a younger age or be carried out more frequently if you are overweight and have at least 1 risk factor for diabetes.  Colorectal cancer can be detected and often prevented. Most routine colorectal cancer screening begins at the age of 50 and continues through age 75. However, your caregiver may recommend screening at an earlier age if you have risk factors for colon cancer. On a yearly basis, your caregiver may provide home test kits to check for hidden blood in the stool. Use of a small camera at the end of a tube,   to directly examine the colon (sigmoidoscopy or colonoscopy), can detect the earliest forms of colorectal cancer. Talk to your caregiver about this at age 50, when routine screening begins. Direct examination of the colon should be repeated every 5 to 10 years through age 75, unless early forms of pre-cancerous polyps or small growths are found.  Hepatitis C blood testing is recommended for all people born from 1945 through 1965 and any individual with known risks for hepatitis C.  Healthy  men should no longer receive prostate-specific antigen (PSA) blood tests as part of routine cancer screening. Consult with your caregiver about prostate cancer screening.  Testicular cancer screening is not recommended for adolescents or adult males who have no symptoms. Screening includes self-exam, caregiver exam, and other screening tests. Consult with your caregiver about any symptoms you have or any concerns you have about testicular cancer.  Practice safe sex. Use condoms and avoid high-risk sexual practices to reduce the spread of sexually transmitted infections (STIs).  Use sunscreen with a sun protection factor (SPF) of 30 or greater. Apply sunscreen liberally and repeatedly throughout the day. You should seek shade when your shadow is shorter than you. Protect yourself by wearing long sleeves, pants, a wide-brimmed hat, and sunglasses year round, whenever you are outdoors.  Notify your caregiver of new moles or changes in moles, especially if there is a change in shape or color. Also notify your caregiver if a mole is larger than the size of a pencil eraser.  A one-time screening for abdominal aortic aneurysm (AAA) and surgical repair of large AAAs by sound wave imaging (ultrasonography) is recommended for ages 65 to 75 years who are current or former smokers.  Stay current with your immunizations. Document Released: 12/04/2007 Document Revised: 08/30/2011 Document Reviewed: 11/02/2010 [employer] Patient Information 2014 [employer].  

## 2012-12-07 ENCOUNTER — Other Ambulatory Visit: Payer: Self-pay | Admitting: Nurse Practitioner

## 2012-12-07 LAB — NMR LIPOPROFILE WITH LIPIDS
HDL Size: 7.8 nm — ABNORMAL LOW (ref >=9.2)
LDL Particle Number: 1030 nmol/L — ABNORMAL HIGH (ref <1000)
Large VLDL-P: 47.4 nmol/L — ABNORMAL HIGH (ref <=2.7)
Small LDL Particle Number: 593 nmol/L — ABNORMAL HIGH (ref <=527)
VLDL Size: 52.1 nm — ABNORMAL HIGH (ref <=46.6)

## 2012-12-07 MED ORDER — ATORVASTATIN CALCIUM 40 MG PO TABS: 40.0000 mg | ORAL_TABLET | Freq: Every day | ORAL | Status: DC

## 2012-12-08 ENCOUNTER — Telehealth: Payer: Self-pay | Admitting: Family Medicine

## 2012-12-08 ENCOUNTER — Other Ambulatory Visit: Payer: Self-pay | Admitting: Nurse Practitioner

## 2012-12-08 MED ORDER — SIMVASTATIN 40 MG PO TABS: 40.0000 mg | ORAL_TABLET | Freq: Every day | ORAL | Status: DC

## 2012-12-08 NOTE — Telephone Encounter (Signed)
NEED Rx WRITTEN FOR PROSTHETIC SUPPLIES DONE TODAY HE NEEDS A NEW PROSTATIC

## 2012-12-08 NOTE — Telephone Encounter (Signed)
rx on my desk

## 2012-12-08 NOTE — Progress Notes (Signed)
Pt aware.

## 2012-12-08 NOTE — Telephone Encounter (Signed)
Up front patient aware 

## 2012-12-21 ENCOUNTER — Telehealth: Payer: Self-pay | Admitting: Nurse Practitioner

## 2012-12-25 ENCOUNTER — Other Ambulatory Visit: Payer: Self-pay | Admitting: *Deleted

## 2012-12-25 DIAGNOSIS — E1159 Type 2 diabetes mellitus with other circulatory complications: Secondary | ICD-10-CM

## 2012-12-25 MED ORDER — GLIPIZIDE 10 MG PO TABS: 10.0000 mg | ORAL_TABLET | Freq: Every day | ORAL | Status: DC

## 2012-12-25 NOTE — Telephone Encounter (Signed)
That ois what was called in  5mg  BID is that correct?

## 2012-12-25 NOTE — Telephone Encounter (Signed)
Glipizide 10mg  is correct med. Per wife.

## 2012-12-25 NOTE — Telephone Encounter (Signed)
Refill sent from pharmacy stating patient is now taking 1 1/2 a day  Last office note from 12/06/12 says 1 tablet

## 2012-12-25 NOTE — Telephone Encounter (Signed)
You increased the amount of med but didn't give a new script.  He needs script for glimipride with dose of one and a half called in to [facility] [place].  He has used up the regular script.

## 2012-12-25 NOTE — Telephone Encounter (Signed)
Please make sure of med he is talking about- not on his list

## 2012-12-27 ENCOUNTER — Other Ambulatory Visit: Payer: Self-pay | Admitting: Nurse Practitioner

## 2012-12-27 DIAGNOSIS — E1159 Type 2 diabetes mellitus with other circulatory complications: Secondary | ICD-10-CM

## 2012-12-27 MED ORDER — GLIPIZIDE 10 MG PO TABS: ORAL_TABLET | ORAL | Status: DC

## 2012-12-27 NOTE — Telephone Encounter (Signed)
Taking a total of 15mg  daily and needs new script to cover that amount.

## 2012-12-27 NOTE — Progress Notes (Signed)
Change glipizide to 1 1/2 daily

## 2012-12-28 NOTE — Telephone Encounter (Signed)
Spoke with patient and he will have wife check and call us back

## 2012-12-28 NOTE — Telephone Encounter (Signed)
Patients wife picked up yesterday because he was out and needed. Only had 30 pills in bottle. Can rx be corrected for next pick up

## 2012-12-28 NOTE — Telephone Encounter (Signed)
Please check on this rx was written for 10 mg 1 1/2  Qd #45- not #30

## 2012-12-29 NOTE — Telephone Encounter (Signed)
Resolved in another encounter.

## 2012-12-29 NOTE — Telephone Encounter (Signed)
Please resolve 

## 2013-01-31 ENCOUNTER — Telehealth: Payer: Self-pay | Admitting: Nurse Practitioner

## 2013-01-31 DIAGNOSIS — K469 Unspecified abdominal hernia without obstruction or gangrene: Secondary | ICD-10-CM

## 2013-02-01 NOTE — Telephone Encounter (Signed)
Detailed message left that referral has been made 

## 2013-02-01 NOTE — Telephone Encounter (Signed)
Referral made 

## 2013-02-12 ENCOUNTER — Ambulatory Visit (INDEPENDENT_AMBULATORY_CARE_PROVIDER_SITE_OTHER): Payer: Medicaid Other | Admitting: Surgery

## 2013-02-26 ENCOUNTER — Ambulatory Visit (INDEPENDENT_AMBULATORY_CARE_PROVIDER_SITE_OTHER): Payer: Medicaid Other | Admitting: Surgery

## 2013-03-06 ENCOUNTER — Other Ambulatory Visit: Payer: Self-pay | Admitting: Nurse Practitioner

## 2013-03-06 ENCOUNTER — Telehealth: Payer: Self-pay | Admitting: Nurse Practitioner

## 2013-03-06 ENCOUNTER — Encounter: Payer: Self-pay | Admitting: Family

## 2013-03-06 DIAGNOSIS — R5381 Other malaise: Secondary | ICD-10-CM

## 2013-03-06 NOTE — Telephone Encounter (Signed)
Order placed

## 2013-03-06 NOTE — Telephone Encounter (Signed)
Patient complains of fatigue. Explained that he would need to be seen by a provider before a hemoglobin can be ordered. Appt scheduled with Paulene Floor, FNP. Patient aware.

## 2013-03-07 ENCOUNTER — Encounter: Payer: Self-pay | Admitting: Family

## 2013-03-07 ENCOUNTER — Other Ambulatory Visit: Payer: Self-pay | Admitting: *Deleted

## 2013-03-07 ENCOUNTER — Ambulatory Visit (INDEPENDENT_AMBULATORY_CARE_PROVIDER_SITE_OTHER): Payer: Medicaid Other | Admitting: Family

## 2013-03-07 ENCOUNTER — Other Ambulatory Visit: Payer: Medicaid Other

## 2013-03-07 DIAGNOSIS — Z48812 Encounter for surgical aftercare following surgery on the circulatory system: Secondary | ICD-10-CM

## 2013-03-07 DIAGNOSIS — I6529 Occlusion and stenosis of unspecified carotid artery: Secondary | ICD-10-CM

## 2013-03-07 DIAGNOSIS — I739 Peripheral vascular disease, unspecified: Secondary | ICD-10-CM

## 2013-03-07 NOTE — Patient Instructions (Signed)
Stroke Prevention Some medical conditions and behaviors are associated with an increased chance of having a stroke. You may prevent a stroke by making healthy choices and managing medical conditions. Reduce your risk of having a stroke by:  Staying physically active. Get at least 30 minutes of activity on most or all days.  Not smoking. It may also be helpful to avoid exposure to secondhand smoke.  Limiting alcohol use. Moderate alcohol use is considered to be:  No more than 2 drinks per day for men.  No more than 1 drink per day for nonpregnant women.  Eating healthy foods.  Include 5 or more servings of fruits and vegetables a day.  Certain diets may be prescribed to address high blood pressure, high cholesterol, diabetes, or obesity.  Managing your cholesterol levels.  A low-saturated fat, low-trans fat, low-cholesterol, and high-fiber diet may control cholesterol levels.  Take any prescribed medicines to control cholesterol as directed by your caregiver.  Managing your diabetes.  A controlled-carbohydrate, controlled-sugar diet is recommended to manage diabetes.  Take any prescribed medicines to control diabetes as directed by your caregiver.  Controlling your high blood pressure (hypertension).  A low-salt (sodium), low-saturated fat, low-trans fat, and low-cholesterol diet is recommended to manage high blood pressure.  Take any prescribed medicines to control hypertension as directed by your caregiver.  Maintaining a healthy weight.  A reduced-calorie, low-sodium, low-saturated fat, low-trans fat, low-cholesterol diet is recommended to manage weight.  Stopping drug abuse.  Avoiding birth control pills.  Talk to your caregiver about the risks of taking birth control pills if you are over 22 years old, smoke, get migraines, or have ever had a blood clot.  Getting evaluated for sleep disorders (sleep apnea).  Talk to your caregiver about getting a sleep evaluation  if you snore a lot or have excessive sleepiness.  Taking medicines as directed by your caregiver.  For some people, aspirin or blood thinners (anticoagulants) are helpful in reducing the risk of forming abnormal blood clots that can lead to stroke. If you have the irregular heart rhythm of atrial fibrillation, you should be on a blood thinner unless there is a good reason you cannot take them.  Understand all your medicine instructions. SEEK IMMEDIATE MEDICAL CARE IF:   You have sudden weakness or numbness of the face, arm, or leg, especially on one side of the body.  You have sudden confusion.  You have trouble speaking (aphasia) or understanding.  You have sudden trouble seeing in one or both eyes.  You have sudden trouble walking.  You have dizziness.  You have a loss of balance or coordination.  You have a sudden, severe headache with no known cause.  You have new chest pain or an irregular heartbeat. Any of these symptoms may represent a serious problem that is an emergency. Do not wait to see if the symptoms will go away. Get medical help right away. Call your local emergency services (911 in [place] Do not drive yourself to the hospital. Document Released: 07/15/2004 Document Revised: 08/30/2011 Document Reviewed: 01/25/2011 [facility] Patient Information 2014 [place], [place].  Peripheral Vascular Disease Peripheral Vascular Disease (PVD), also called Peripheral Arterial Disease (PAD), is a circulation problem caused by cholesterol (atherosclerotic plaque) deposits in the arteries. PVD commonly occurs in the lower extremities (legs) but it can occur in other areas of the body, such as your arms. The cholesterol buildup in the arteries reduces blood flow which can cause pain and other serious problems. The presence of  PVD can place a person at risk for Coronary Artery Disease (CAD).  CAUSES  Causes of PVD can be many. It is usually associated with more than one risk factor such  as:   High Cholesterol.  Smoking.  Diabetes.  Lack of exercise or inactivity.  High blood pressure (hypertension).  Obesity.  Family history. SYMPTOMS   When the lower extremities are affected, patients with PVD may experience:  Leg pain with exertion or physical activity. This is called INTERMITTENT CLAUDICATION. This may present as cramping or numbness with physical activity. The location of the pain is associated with the level of blockage. For example, blockage at the abdominal level (distal abdominal aorta) may result in buttock or hip pain. Lower leg arterial blockage may result in calf pain.  As PVD becomes more severe, pain can develop with less physical activity.  In people with severe PVD, leg pain may occur at rest.  Other PVD signs and symptoms:  Leg numbness or weakness.  Coldness in the affected leg or foot, especially when compared to the other leg.  A change in leg color.  Patients with significant PVD are more prone to ulcers or sores on toes, feet or legs. These may take longer to heal or may reoccur. The ulcers or sores can become infected.  If signs and symptoms of PVD are ignored, gangrene may occur. This can result in the loss of toes or loss of an entire limb.  Not all leg pain is related to PVD. Other medical conditions can cause leg pain such as:  Blood clots (embolism) or Deep Vein Thrombosis.  Inflammation of the blood vessels (vasculitis).  Spinal stenosis. DIAGNOSIS  Diagnosis of PVD can involve several different types of tests. These can include:  Pulse Volume Recording Method (PVR). This test is simple, painless and does not involve the use of X-rays. PVR involves measuring and comparing the blood pressure in the arms and legs. An ABI (Ankle-Brachial Index) is calculated. The normal ratio of blood pressures is 1. As this number becomes smaller, it indicates more severe disease.  < 0.95  indicates significant narrowing in one or more leg  vessels.  <0.8 there will usually be pain in the foot, leg or buttock with exercise.  <0.4 will usually have pain in the legs at rest.  <0.25  usually indicates limb threatening PVD.  Doppler detection of pulses in the legs. This test is painless and checks to see if you have a pulses in your legs/feet.  A dye or contrast material (a substance that highlights the blood vessels so they show up on x-ray) may be given to help your caregiver better see the arteries for the following tests. The dye is eliminated from your body by the kidney's. Your caregiver may order blood work to check your kidney function and other laboratory values before the following tests are performed:  Magnetic Resonance Angiography (MRA). An MRA is a picture study of the blood vessels and arteries. The MRA machine uses a large magnet to produce images of the blood vessels.  Computed Tomography Angiography (CTA). A CTA is a specialized x-ray that looks at how the blood flows in your blood vessels. An IV may be inserted into your arm so contrast dye can be injected.  Angiogram. Is a procedure that uses x-rays to look at your blood vessels. This procedure is minimally invasive, meaning a small incision (cut) is made in your groin. A small tube (catheter) is then inserted into the artery of  your groin. The catheter is guided to the blood vessel or artery your caregiver wants to examine. Contrast dye is injected into the catheter. X-rays are then taken of the blood vessel or artery. After the images are obtained, the catheter is taken out. TREATMENT  Treatment of PVD involves many interventions which may include:  Lifestyle changes:  Quitting smoking.  Exercise.  Following a low fat, low cholesterol diet.  Control of diabetes.  Foot care is very important to the PVD patient. Good foot care can help prevent infection.  Medication:  Cholesterol-lowering medicine.  Blood pressure medicine.  Anti-platelet  drugs.  Certain medicines may reduce symptoms of Intermittent Claudication.  Interventional/Surgical options:  Angioplasty. An Angioplasty is a procedure that inflates a balloon in the blocked artery. This opens the blocked artery to improve blood flow.  Stent Implant. A wire mesh tube (stent) is placed in the artery. The stent expands and stays in place, allowing the artery to remain open.  Peripheral Bypass Surgery. This is a surgical procedure that reroutes the blood around a blocked artery to help improve blood flow. This type of procedure may be performed if Angioplasty or stent implants are not an option. SEEK IMMEDIATE MEDICAL CARE IF:   You develop pain or numbness in your arms or legs.  Your arm or leg turns cold, becomes blue in color.  You develop redness, warmth, swelling and pain in your arms or legs. MAKE SURE YOU:   Understand these instructions.  Will watch your condition.  Will get help right away if you are not doing well or get worse. Document Released: 07/15/2004 Document Revised: 08/30/2011 Document Reviewed: 06/11/2008 [facility] Patient Information 2014 [place], [place].   Smoking Cessation Quitting smoking is important to your health and has many advantages. However, it is not always easy to quit since nicotine is a very addictive drug. Often times, people try 3 times or more before being able to quit. This document explains the best ways for you to prepare to quit smoking. Quitting takes hard work and a lot of effort, but you can do it. ADVANTAGES OF QUITTING SMOKING  You will live longer, feel better, and live better.  Your body will feel the impact of quitting smoking almost immediately.  Within 20 minutes, blood pressure decreases. Your pulse returns to its normal level.  After 8 hours, carbon monoxide levels in the blood return to normal. Your oxygen level increases.  After 24 hours, the chance of having a heart attack starts to decrease. Your  breath, hair, and body stop smelling like smoke.  After 48 hours, damaged nerve endings begin to recover. Your sense of taste and smell improve.  After 72 hours, the body is virtually free of nicotine. Your bronchial tubes relax and breathing becomes easier.  After 2 to 12 weeks, lungs can hold more air. Exercise becomes easier and circulation improves.  The risk of having a heart attack, stroke, cancer, or lung disease is greatly reduced.  After 1 year, the risk of coronary heart disease is cut in half.  After 5 years, the risk of stroke falls to the same as a nonsmoker.  After 10 years, the risk of lung cancer is cut in half and the risk of other cancers decreases significantly.  After 15 years, the risk of coronary heart disease drops, usually to the level of a nonsmoker.  If you are pregnant, quitting smoking will improve your chances of having a healthy baby.  The people you live with, especially  any children, will be healthier.  You will have extra money to spend on things other than cigarettes. QUESTIONS TO THINK ABOUT BEFORE ATTEMPTING TO QUIT You may want to talk about your answers with your caregiver.  Why do you want to quit?  If you tried to quit in the past, what helped and what did not?  What will be the most difficult situations for you after you quit? How will you plan to handle them?  Who can help you through the tough times? Your family? Friends? A caregiver?  What pleasures do you get from smoking? What ways can you still get pleasure if you quit? Here are some questions to ask your caregiver:  How can you help me to be successful at quitting?  What medicine do you think would be best for me and how should I take it?  What should I do if I need more help?  What is smoking withdrawal like? How can I get information on withdrawal? GET READY  Set a quit date.  Change your environment by getting rid of all cigarettes, ashtrays, matches, and lighters in  your home, car, or work. Do not let people smoke in your home.  Review your past attempts to quit. Think about what worked and what did not. GET SUPPORT AND ENCOURAGEMENT You have a better chance of being successful if you have help. You can get support in many ways.  Tell your family, friends, and co-workers that you are going to quit and need their support. Ask them not to smoke around you.  Get individual, group, or telephone counseling and support. Programs are available at [employer] and health centers. Call your local health department for information about programs in your area.  Spiritual beliefs and practices may help some smokers quit.  Download a "quit meter" on your computer to keep track of quit statistics, such as how long you have gone without smoking, cigarettes not smoked, and money saved.  Get a self-help book about quitting smoking and staying off of tobacco. LEARN NEW SKILLS AND BEHAVIORS  Distract yourself from urges to smoke. Talk to someone, go for a walk, or occupy your time with a task.  Change your normal routine. Take a different route to work. Drink tea instead of coffee. Eat breakfast in a different place.  Reduce your stress. Take a hot bath, exercise, or read a book.  Plan something enjoyable to do every day. Reward yourself for not smoking.  Explore interactive web-based programs that specialize in helping you quit. GET MEDICINE AND USE IT CORRECTLY Medicines can help you stop smoking and decrease the urge to smoke. Combining medicine with the above behavioral methods and support can greatly increase your chances of successfully quitting smoking.  Nicotine replacement therapy helps deliver nicotine to your body without the negative effects and risks of smoking. Nicotine replacement therapy includes nicotine gum, lozenges, inhalers, nasal sprays, and skin patches. Some may be available over-the-counter and others require a  prescription.  Antidepressant medicine helps people abstain from smoking, but how this works is unknown. This medicine is available by prescription.  Nicotinic receptor partial agonist medicine simulates the effect of nicotine in your brain. This medicine is available by prescription. Ask your caregiver for advice about which medicines to use and how to use them based on your health history. Your caregiver will tell you what side effects to look out for if you choose to be on a medicine or therapy. Carefully read the information on  the package. Do not use any other product containing nicotine while using a nicotine replacement product.  RELAPSE OR DIFFICULT SITUATIONS Most relapses occur within the first 3 months after quitting. Do not be discouraged if you start smoking again. Remember, most people try several times before finally quitting. You may have symptoms of withdrawal because your body is used to nicotine. You may crave cigarettes, be irritable, feel very hungry, cough often, get headaches, or have difficulty concentrating. The withdrawal symptoms are only temporary. They are strongest when you first quit, but they will go away within 10 14 days. To reduce the chances of relapse, try to:  Avoid drinking alcohol. Drinking lowers your chances of successfully quitting.  Reduce the amount of caffeine you consume. Once you quit smoking, the amount of caffeine in your body increases and can give you symptoms, such as a rapid heartbeat, sweating, and anxiety.  Avoid smokers because they can make you want to smoke.  Do not let weight gain distract you. Many smokers will gain weight when they quit, usually less than 10 pounds. Eat a healthy diet and stay active. You can always lose the weight gained after you quit.  Find ways to improve your mood other than smoking. FOR MORE INFORMATION  www.smokefree.gov  Document Released: 06/01/2001 Document Revised: 12/07/2011 Document Reviewed:  09/16/2011 [facility] Patient Information 2014 [place], [place].

## 2013-03-07 NOTE — Progress Notes (Signed)
Established Carotid Patient  Previous Carotid surgery: Yes   History of Present Illness  [name] is a 62 y.o. male who has known carotid stenosis. Pt was seen by His Opthamologist who noted plaque in the ocular vessels on exam. He states he has been on tricor but stopped taking it when prescription ran out. He stopped taking Zocor as it caused soreness at left breast which resolved when he stopped taking it. He takes 165mg  ASA daily.  Patient has had previous Left CEA in August of 2013 by [name]. He still complains of mild numbness in chin area. He also had bilateral common superficial femoral and profundus artery endarterectomies by [name] in 2003. He continues to smoke 2 PPD.  Pt. Denies ever having a stroke or TIA symptoms.  The patient denies amaurosis fugax or monocular blindness.  The patient  denies facial drooping.  Pt. denies hemiplegia.  The patient denies receptive or expressive aphasia.  Pt. reports weakness in legs. He has painful neuropathy in his left foot. 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  His insurance denied carotid US today, will try to get authorized again.  The patient's neurologic status is Unchanged.  Patient denies New Medical or Surgical History  Pt Diabetic: Yes, A1C on record from 3 months ago was 7.7%, uncontrolled. Pt smoker: smoker  (2 ppd x 45 yrs)  Pt meds include: Statin : No Betablocker: Yes ASA: Yes Other anticoagulants/antiplatelets: none   Past Medical History  Diagnosis Date  . Diverticulosis 03/04/10  . Peripheral vascular disease, unspecified   . Undiagnosed cardiac murmurs   . Other symptoms involving cardiovascular system   . HLD (hyperlipidemia)   . HTN (hypertension)   . Type II or unspecified type diabetes mellitus without mention of complication, not stated as uncontrolled   . Ulcer   . Carotid artery occlusion   . Umbilical hernia now    has not been repaired  . Blood transfusion   . AVM (arteriovenous  malformation) of colon with hemorrhage   . Gastritis   . Internal hemorrhoids   . Right carotid bruit   . Shortness of breath     noted /w low Hgb  . GERD (gastroesophageal reflux disease)   . Chronic kidney disease     renal failure /w osteomyelitis- 2010  . Arthritis     back  . Anemia     8/3,4,5- blood transfusion- [facility], colonoscopy- done & found 2 areas of bleeding   . Tobacco use disorder   . Anxiety   . CHF (congestive heart failure)     3 yrs ago  . Irregular heart beat     Social History History  Substance Use Topics  . Smoking status: Current Every Day Smoker -- 1.50 packs/day for 50 years    Types: Cigarettes  . Smokeless tobacco: Former Neurosurgeon    Quit date: 02/08/2012  . Alcohol Use: No    Family History Family History  Problem Relation Age of Onset  . Colon cancer Brother 50    deceased  . Hyperlipidemia Brother   . Hypertension Brother   . Lung cancer Sister     deceased, three primary cancers: lung, esophageal, lymphoma.   . Diabetes Sister   . Hypertension Sister   . Cancer Father     gallbladder  . Hyperlipidemia Father   . Hypertension Father   . Cancer Brother     unknown primary  . Cancer Sister     unknown  primary  . Hyperlipidemia Sister   . Diabetes Mother   . Hypertension Mother     Surgical History Past Surgical History  Procedure Laterality Date  . Colonoscopy  03/04/2010    Dr. Alysia Penna AMV without mention of ablation, scattered diverticula, internal hemorrhoids  . Bilateral common superficial femoral and profudus artery endarterectomies      2003    [name] Early  . Esophagogastroduodenoscopy  03/2000    [name] gastritis, H.Pylori gastritis, ?treated  . Esophagogastroduodenoscopy  05/2006    [name] recurrent GI bleed. antral gastritis, single gastric AVM s/p APC, CLO results?  . Colonoscopy  11/2004    [name]  . 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 Both of these are ablated with argon plasma coagulator./ Small external hemorrhoids  . Esophagogastroduodenoscopy  01/21/2012    SLF: MILD TO MAODERATE Gastritis/ SLOW GIB LIKEY DUE TO PT BEING ON ASA ND SUBSEQUENT BLOOD/ LOSS FROM AVMS IN STOMACH AND COLON AS WELL AS GASTRITIS  . Enteroscopy  01/21/2012    Procedure: ENTEROSCOPY;  Surgeon: [name];  Location: [facility];  Service: Endoscopy;;  . No past surgeries    . Vascular surgery      rt bka and rt 1st toe amputation  . Below knee leg amputation      2010, wears prosthesis   . Endarterectomy  02/09/2012    Procedure: ENDARTERECTOMY CAROTID;  Surgeon: [name];  Location: [facility] OR;  Service: Vascular;  Laterality: Left;  . Carotid endarterectomy Left 02-09-12    Allergies  Allergen Reactions  . Zocor [Simvastatin] Palpitations    Chest soreness  . Hctz [Hydrochlorothiazide]     Leg cramps  . Iodine Hives    IVP dye  . Sulfa Antibiotics Other (See Comments)    Unknown     Current Outpatient Prescriptions  Medication Sig Dispense Refill  . amLODipine (NORVASC) 10 MG tablet Take 1 tablet (10 mg total) by mouth daily before breakfast.  30 tablet  5  . aspirin 325 MG tablet Take 325 mg by mouth every morning. Take 1/2 tab qam      . atenolol (TENORMIN) 100 MG tablet 1/2 tablet by mouth 2 times daily  30 tablet  5  . furosemide (LASIX) 40 MG tablet Take 1 tablet (40 mg total) by mouth as needed.  30 tablet  5  . glipiZIDE (GLUCOTROL) 10 MG tablet 1 1/2 Tab Po QD  45 tablet  2  . naproxen sodium (ANAPROX) 220 MG tablet Take 220 mg by mouth 2 (two) times daily as needed.      . pantoprazole (PROTONIX) 20 MG tablet Take 20 mg by mouth daily.      . valsartan (DIOVAN) 160 MG tablet Take 1 tablet (160 mg total) by mouth daily.  30 tablet  5  . simvastatin (ZOCOR) 40 MG tablet Take 1 tablet (40 mg  total) by mouth at bedtime.  90 tablet  3   No current facility-administered medications for this visit.  Not taking Zocor  Review of Systems : [x]  Positive   [ ]  Denies  General:[ ]  Weight loss,  [ ]  Weight gain, [ ]  Loss of appetite, [ ]  Fever, [ ]  chills  Neurologic: [ ]  Dizziness, [ ]  Blackouts, [ ]  Headaches, [ ]  Seizure [ ]   Stroke, [ ]  "Mini stroke", [ ]  Slurred speech, [ ]  Temporary blindness;  [ ] weakness,  Ear/Nose/Throat: [ ]  Change in hearing, [ ]  Nose bleeds, [ ]  Hoarseness  Vascular:[ ]  Pain in legs with walking, [ ]  Pain in feet while lying flat , [x ]  Non-healing ulcer, before his right BKA [ ]  Blood clot in vein,    Pulmonary: [ ]  Home oxygen, [ ]   Productive cough, [ ]  Bronchitis, [ ]  Coughing up blood,  [ ]  Asthma, [ ]  Wheezing  Musculoskeletal:  [ ]  Arthritis, [ ]  Joint pain, [ ]  low back pain  Cardiac: [ ]  Chest pain, [ ]  Shortness of breath when lying flat, [ ]  Shortness of breath with exertion, [ ]  Palpitations, [ ]  Heart murmur, [ ]   Atrial fibrillation  Hematologic:[ ]  Easy Bruising, [ ]  Anemia; [ ]  Hepatitis  Psychiatric: [ ]   Depression, [ ]  Anxiety   Gastrointestinal: [ ]  Black stool, [ ]  Blood in stool, [ ]  Peptic ulcer disease,  [ ]  Gastroesophageal Reflux, [ ]  Trouble swallowing, [ ]  Diarrhea, [ ]  Constipation  Urinary: [ ]  chronic Kidney disease, [ ]  on HD, [ ]  Burning with urination, [ ]  Frequent urination, [ ]  Difficulty urinating;   Skin: [ ]  Rashes, [ ]  Wounds    Physical Examination  Filed Vitals:   03/07/13 1026  BP: 151/59  Pulse: 76  Resp:    Filed Weights   03/07/13 1023  Weight: 185 lb (83.915 kg)   Body mass index is 30.31 kg/(m^2).  General: WDWN male in NAD, pale, obese. GAIT: limp Eyes: PERRLA Pulmonary:  Distant breath sounds in all fields, Negative  Rales, Negative rhonchi, & Positive transient wheezing.  Cardiac: regular Rhythm ,  Negative Murmurs.  VASCULAR EXAM Carotid Bruits Left Right   Positive Positive                                                                                                                                 LE Pulses LEFT RIGHT       FEMORAL   palpable   palpable        POPLITEAL  not palpable   not palpable       POSTERIOR TIBIAL  biphasic by Doppler and not palpable  not palpable       DORSALIS PEDIS      ANTERIOR TIBIAL  monophasic by Doppler and not palpable   Right BKA        PERONEAL  non-Dopplerable and not Palpable   Right BKA    Gastrointestinal: soft, nontender, BS WNL, no r/g,  negative masses.  Musculoskeletal: Negative muscle atrophy/wasting. M/S 5/5 throughout, Extremities without ischemic changes  except left great toe absent. Right BKA.  Neurologic: A&O X 3; Appropriate Affect ; SENSATION : decreased to touch in left lower leg; Speech is normal CN 2-12 intact, Pain and light touch intact in extremities except as  above, Motor exam as listed above.   Assessment: [name] is a 62 y.o. male who presents status post left CEA and bilateral common superficial femoral and profundus artery endarterectomies. He has also had a right below the knee amputation secondary to PAD. His risk factors for PAD and stroke remain smoking (2 PPD) and uncontrolled diabetes mellitus. He needs ongoing surveillance of his carotid arteries and both lower extremities.  His insurance denied carotid Duplex today, will try to reschedule if authorized in a couple of weeks.  Plan: Follow-up in 2-4 weeks with Carotid Duplex scan, ABI, LE Duplex.  I discussed in depth with the patient the nature of atherosclerosis, and emphasized the importance of maximal medical management including strict control of blood pressure, blood glucose, and lipid levels, obtaining regular exercise, and cessation of smoking.  The patient is aware that without maximal medical management the underlying atherosclerotic disease process will progress, limiting the benefit of any interventions. The  patient was given information about stroke prevention and what symptoms should prompt the patient to seek immediate medical care. Thank you for allowing [place] to participate in this patient's care.  Charisse March, RN, MSN, FNP-C [facility] Office: [phone]  Clinic Physician: Edilia Bo  03/07/2013 10:38 AM

## 2013-03-08 ENCOUNTER — Encounter (INDEPENDENT_AMBULATORY_CARE_PROVIDER_SITE_OTHER): Payer: Self-pay | Admitting: Surgery

## 2013-03-08 ENCOUNTER — Ambulatory Visit: Payer: Medicaid Other | Admitting: Nurse Practitioner

## 2013-03-19 ENCOUNTER — Ambulatory Visit (INDEPENDENT_AMBULATORY_CARE_PROVIDER_SITE_OTHER): Payer: Medicaid Other | Admitting: Surgery

## 2013-03-23 ENCOUNTER — Other Ambulatory Visit (HOSPITAL_COMMUNITY): Payer: Medicaid Other

## 2013-03-23 ENCOUNTER — Encounter (HOSPITAL_COMMUNITY): Payer: Medicaid Other

## 2013-03-23 ENCOUNTER — Ambulatory Visit: Payer: Medicaid Other | Admitting: Family

## 2013-03-26 ENCOUNTER — Ambulatory Visit (INDEPENDENT_AMBULATORY_CARE_PROVIDER_SITE_OTHER): Payer: Medicaid Other | Admitting: Surgery

## 2013-04-10 ENCOUNTER — Other Ambulatory Visit: Payer: Medicaid Other

## 2013-04-10 ENCOUNTER — Ambulatory Visit: Payer: Medicaid Other | Admitting: Vascular Surgery

## 2013-04-14 ENCOUNTER — Other Ambulatory Visit: Payer: Self-pay | Admitting: Nurse Practitioner

## 2013-04-17 ENCOUNTER — Ambulatory Visit (INDEPENDENT_AMBULATORY_CARE_PROVIDER_SITE_OTHER): Payer: Medicaid Other | Admitting: Surgery

## 2013-04-17 ENCOUNTER — Ambulatory Visit: Payer: Medicaid Other | Admitting: Family

## 2013-04-17 ENCOUNTER — Other Ambulatory Visit: Payer: Medicaid Other

## 2013-04-17 NOTE — Telephone Encounter (Signed)
?   Last seen 12/06/12 

## 2013-05-07 ENCOUNTER — Other Ambulatory Visit: Payer: Self-pay | Admitting: Family Medicine

## 2013-05-08 NOTE — Telephone Encounter (Signed)
Pharmacy requesting refill for Exforge 10-320. 1 QD. Do not see on med list. Please advise.

## 2013-05-10 ENCOUNTER — Other Ambulatory Visit: Payer: Self-pay | Admitting: Nurse Practitioner

## 2013-05-10 ENCOUNTER — Telehealth: Payer: Self-pay | Admitting: *Deleted

## 2013-05-10 MED ORDER — VALSARTAN 320 MG PO TABS: 320.0000 mg | ORAL_TABLET | Freq: Every day | ORAL | Status: DC

## 2013-05-10 MED ORDER — LOSARTAN POTASSIUM 100 MG PO TABS: 100.0000 mg | ORAL_TABLET | Freq: Every day | ORAL | Status: DC

## 2013-05-10 NOTE — Telephone Encounter (Signed)
Ins will not cover valsartin and amlodipine together so I will need to get prior authorization for the diovan 320, can I assume that this is what you want?

## 2013-05-10 NOTE — Telephone Encounter (Signed)
We can change him to losartan 100mg - will send rx to pharmacy- patient will need to be seen in 2 weeks to check blood pressure.

## 2013-06-05 ENCOUNTER — Other Ambulatory Visit: Payer: Self-pay | Admitting: Nurse Practitioner

## 2013-06-06 NOTE — Telephone Encounter (Signed)
Last seen 12/06/12  MMM Not on EPIC list

## 2013-06-07 NOTE — Telephone Encounter (Signed)
[name] i do not know how to make appts

## 2013-06-11 NOTE — Telephone Encounter (Signed)
No answer tried calling patient

## 2013-06-12 ENCOUNTER — Other Ambulatory Visit: Payer: Self-pay | Admitting: Nurse Practitioner

## 2013-07-09 ENCOUNTER — Telehealth: Payer: Self-pay | Admitting: Nurse Practitioner

## 2013-07-09 NOTE — Telephone Encounter (Signed)
Appointment made with mmm friday

## 2013-07-13 ENCOUNTER — Encounter: Payer: Self-pay | Admitting: Nurse Practitioner

## 2013-07-13 ENCOUNTER — Encounter (INDEPENDENT_AMBULATORY_CARE_PROVIDER_SITE_OTHER): Payer: Self-pay

## 2013-07-13 ENCOUNTER — Other Ambulatory Visit: Payer: Self-pay | Admitting: Nurse Practitioner

## 2013-07-13 ENCOUNTER — Ambulatory Visit (INDEPENDENT_AMBULATORY_CARE_PROVIDER_SITE_OTHER): Payer: Medicaid Other | Admitting: Nurse Practitioner

## 2013-07-13 VITALS — BP 160/78 | HR 96 | Temp 97.7°F | Ht 65.5 in | Wt 187.0 lb

## 2013-07-13 DIAGNOSIS — E119 Type 2 diabetes mellitus without complications: Secondary | ICD-10-CM

## 2013-07-13 DIAGNOSIS — D649 Anemia, unspecified: Secondary | ICD-10-CM

## 2013-07-13 DIAGNOSIS — Z89619 Acquired absence of unspecified leg above knee: Secondary | ICD-10-CM

## 2013-07-13 DIAGNOSIS — I10 Essential (primary) hypertension: Secondary | ICD-10-CM

## 2013-07-13 DIAGNOSIS — Z9119 Patient's noncompliance with other medical treatment and regimen: Secondary | ICD-10-CM

## 2013-07-13 DIAGNOSIS — K5900 Constipation, unspecified: Secondary | ICD-10-CM

## 2013-07-13 DIAGNOSIS — E1149 Type 2 diabetes mellitus with other diabetic neurological complication: Secondary | ICD-10-CM

## 2013-07-13 DIAGNOSIS — I2510 Atherosclerotic heart disease of native coronary artery without angina pectoris: Secondary | ICD-10-CM

## 2013-07-13 DIAGNOSIS — Z89519 Acquired absence of unspecified leg below knee: Secondary | ICD-10-CM

## 2013-07-13 DIAGNOSIS — I739 Peripheral vascular disease, unspecified: Secondary | ICD-10-CM

## 2013-07-13 DIAGNOSIS — E1142 Type 2 diabetes mellitus with diabetic polyneuropathy: Secondary | ICD-10-CM

## 2013-07-13 DIAGNOSIS — Z91199 Patient's noncompliance with other medical treatment and regimen due to unspecified reason: Secondary | ICD-10-CM

## 2013-07-13 DIAGNOSIS — S88119A Complete traumatic amputation at level between knee and ankle, unspecified lower leg, initial encounter: Secondary | ICD-10-CM

## 2013-07-13 DIAGNOSIS — E785 Hyperlipidemia, unspecified: Secondary | ICD-10-CM

## 2013-07-13 LAB — POCT HEMOGLOBIN: Hemoglobin: 8.1 g/dL — AB (ref 14.1–18.1)

## 2013-07-13 LAB — POCT GLYCOSYLATED HEMOGLOBIN (HGB A1C): HEMOGLOBIN A1C: 6.6

## 2013-07-13 LAB — POCT UA - MICROALBUMIN: MICROALBUMIN (UR) POC: 100 mg/L

## 2013-07-13 MED ORDER — ATENOLOL 100 MG PO TABS: 100.0000 mg | ORAL_TABLET | Freq: Every day | ORAL | Status: DC

## 2013-07-13 MED ORDER — PANTOPRAZOLE SODIUM 20 MG PO TBEC: 20.0000 mg | DELAYED_RELEASE_TABLET | Freq: Every day | ORAL | Status: DC

## 2013-07-13 MED ORDER — GLIPIZIDE 10 MG PO TABS: ORAL_TABLET | ORAL | Status: DC

## 2013-07-13 MED ORDER — AMLODIPINE BESYLATE 10 MG PO TABS: 10.0000 mg | ORAL_TABLET | Freq: Every day | ORAL | Status: DC

## 2013-07-13 MED ORDER — VALSARTAN 320 MG PO TABS: 320.0000 mg | ORAL_TABLET | Freq: Every day | ORAL | Status: DC

## 2013-07-13 MED ORDER — FERRALET 90 90-1 MG PO TABS: 1.0000 | ORAL_TABLET | Freq: Every day | ORAL | Status: DC

## 2013-07-13 MED ORDER — FUROSEMIDE 40 MG PO TABS
40.0000 mg | ORAL_TABLET | ORAL | Status: DC | PRN
Start: 2013-07-13 — End: 2014-04-02

## 2013-07-13 MED ORDER — CLONIDINE HCL 0.1 MG PO TABS: 0.1000 mg | ORAL_TABLET | Freq: Two times a day (BID) | ORAL | Status: DC

## 2013-07-13 MED ORDER — SIMVASTATIN 40 MG PO TABS: 40.0000 mg | ORAL_TABLET | Freq: Every day | ORAL | Status: DC

## 2013-07-13 NOTE — Patient Instructions (Signed)

## 2013-07-13 NOTE — Addendum Note (Signed)
Addended by: Earlene Plater on: 07/13/2013 09:50 AM   Modules accepted: Orders

## 2013-07-13 NOTE — Progress Notes (Signed)
Subjective:    Patient ID: [name], male    DOB: 1951/01/23, 63 y.o.   MRN: [id number]  HPI Patient here today for follow up of chronic medical problems- He has a lot of medical problems and continues to smoke despite being told that he really needs to quit. He says that he has had a his right artificial lower ext limb and both times it has been made incorrect ( two times to short and the other time to long ). Also wants to see [place] doctor because he has problems with constipation which gets worse when he eats cheese or meats. He says that he takes mineral oil every morning. He has tied miralax which does not help. Otherwise he is doing well. * fasting blood sugars are running high - he does not check everyday- does not watch diet or exercise. * patient stopped iron tablets because of worsening constipation.  Patient Active Problem List   Diagnosis Date Noted  . Aftercare following surgery of the circulatory system, [place] 03/07/2013  . Acute blood loss anemia 01/20/2012  . GI bleeding 01/20/2012  . Hyponatremia 01/20/2012  . Acute renal failure 01/20/2012  . Occlusion and stenosis of carotid artery without mention of cerebral infarction 01/11/2012  . CAD (coronary artery disease) 10/07/2010  . CKD (chronic kidney disease) 10/07/2010  . Amputee, below knee 10/07/2010  . Diabetic peripheral neuropathy 10/07/2010  . Anemia 10/07/2010  . Noncompliance 10/07/2010  . DIABETES MELLITUS 11/20/2007  . HYPERLIPIDEMIA 11/20/2007  . TOBACCO USER 11/20/2007  . HYPERTENSION 11/20/2007  . PERIPHERAL VASCULAR DISEASE 11/20/2007  . SYSTOLIC MURMUR 22/63/3354  . CAROTID BRUIT, RIGHT 11/20/2007   Outpatient Encounter Prescriptions as of 07/13/2013  Medication Sig  . amLODipine (NORVASC) 10 MG tablet Take 1 tablet (10 mg total) by mouth daily before breakfast.  . aspirin 325 MG tablet Take 325 mg by mouth every morning. Take 1/2 tab qam  . atenolol (TENORMIN) 100 MG tablet TAKE 1/2 TABLET BY  MOUTH TWICE A DAY  . furosemide (LASIX) 40 MG tablet Take 1 tablet (40 mg total) by mouth as needed.  Marland Kitchen glipiZIDE (GLUCOTROL) 10 MG tablet TAKE 1&1/2 TABLETS BY MOUTH EVERY DAY  . naproxen sodium (ANAPROX) 220 MG tablet Take 220 mg by mouth 2 (two) times daily as needed.  . pantoprazole (PROTONIX) 20 MG tablet Take 20 mg by mouth daily.  . silver sulfADIAZINE (SILVADENE) 1 % cream APPLY TO AFFECTED AREA TWICE A DAY  . simvastatin (ZOCOR) 40 MG tablet Take 1 tablet (40 mg total) by mouth at bedtime.  . valsartan (DIOVAN) 320 MG tablet Take 1 tablet (320 mg total) by mouth daily.       Review of Systems  Constitutional: Negative.   HENT: Negative.   Respiratory: Negative.   Cardiovascular: Negative.   Gastrointestinal: Negative.   Genitourinary: Negative.   Musculoskeletal: Negative.   All other systems reviewed and are negative.       Objective:   Physical Exam  Constitutional: He is oriented to person, place, and time. He appears well-developed and well-nourished.  HENT:  Head: Normocephalic.  Right Ear: External ear normal.  Left Ear: External ear normal.  Nose: Nose normal.  Mouth/Throat: Oropharynx is clear and moist.  Eyes: EOM are normal. Pupils are equal, round, and reactive to light.  Neck: Normal range of motion. Neck supple. No JVD present. No thyromegaly present.  Cardiovascular: Normal rate, regular rhythm, normal heart sounds and intact distal pulses.  Exam reveals no  gallop and no friction rub.   No murmur heard. Pulmonary/Chest: Effort normal and breath sounds normal. No respiratory distress. He has no wheezes. He has no rales. He exhibits no tenderness.  Abdominal: Soft. Bowel sounds are normal. He exhibits no mass. There is no tenderness.  Umbilical hernia  Genitourinary:  Declines rectal exam  Musculoskeletal: Normal range of motion. He exhibits no edema.  Right below the knee amputee  Lymphadenopathy:    He has no cervical adenopathy.  Neurological:  He is alert and oriented to person, place, and time. No cranial nerve deficit.  Skin: Skin is warm and dry.  Psychiatric: He has a normal mood and affect. His behavior is normal. Judgment and thought content normal.   BP 160/78  Pulse 96  Temp(Src) 97.7 F (36.5 C) (Oral)  Ht 5' 5.5" (1.664 m)  Wt 187 lb (84.823 kg)  BMI 30.63 kg/m2 Results for orders placed in visit on 07/13/13  POCT GLYCOSYLATED HEMOGLOBIN (HGB A1C)      Result Value Range   Hemoglobin A1C 6.6    POCT HEMOGLOBIN      Result Value Range   Hemoglobin 8.1 (*) 14.1 - 18.1 g/dL       Assessment & Plan:   1. DIABETES MELLITUS   2. HYPERLIPIDEMIA   3. HYPERTENSION   4. PERIPHERAL VASCULAR DISEASE   5. Noncompliance   6. Diabetic peripheral neuropathy   7. CAD (coronary artery disease)   8. Anemia   9. Amputee, below knee   10. Hypertension    Orders Placed This Encounter  Procedures  . CMP14+EGFR  . NMR, lipoprofile  . POCT glycosylated hemoglobin (Hb A1C)  . POCT UA - Microalbumin  . POCT hemoglobin    Meds ordered this encounter  Medications  . amLODipine (NORVASC) 10 MG tablet    Sig: Take 1 tablet (10 mg total) by mouth daily before breakfast.    Dispense:  30 tablet    Refill:  5    Order Specific Question:  Supervising Provider    Answer:  Chipper Herb [[id number]]  . pantoprazole (PROTONIX) 20 MG tablet    Sig: Take 1 tablet (20 mg total) by mouth daily.    Dispense:  30 tablet    Refill:  5    Order Specific Question:  Supervising Provider    Answer:  Chipper Herb [[id number]]  . valsartan (DIOVAN) 320 MG tablet    Sig: Take 1 tablet (320 mg total) by mouth daily.    Dispense:  30 tablet    Refill:  5    Order Specific Question:  Supervising Provider    Answer:  Chipper Herb [[id number]]  . furosemide (LASIX) 40 MG tablet    Sig: Take 1 tablet (40 mg total) by mouth as needed.    Dispense:  30 tablet    Refill:  5    Order Specific Question:  Supervising Provider    Answer:  Chipper Herb [[id number]]  . simvastatin (ZOCOR) 40 MG tablet    Sig: Take 1 tablet (40 mg total) by mouth at bedtime.    Dispense:  30 tablet    Refill:  5    Order Specific Question:  Supervising Provider    Answer:  Chipper Herb [[id number]]  . atenolol (TENORMIN) 100 MG tablet    Sig: Take 1 tablet (100 mg total) by mouth daily.    Dispense:  30 tablet    Refill:  5  Order Specific Question:  Supervising Provider    Answer:  Chipper Herb [[id number]]  . glipiZIDE (GLUCOTROL) 10 MG tablet    Sig: 1 1/2 po qd    Dispense:  45 tablet    Refill:  5    Order Specific Question:  Supervising Provider    Answer:  Chipper Herb [[id number]]  . cloNIDine (CATAPRES) 0.1 MG tablet    Sig: Take 1 tablet (0.1 mg total) by mouth 2 (two) times daily.    Dispense:  60 tablet    Refill:  5    Order Specific Question:  Supervising Provider    Answer:  Chipper Herb [[id number]]  started back on iron tablets- recheck hgb in 1 week Added clonidine 0.1 BID to meds for B/P Gi referral made Labs pending Health maintenance reviewed Diet and exercise encouraged Continue all meds Follow up  In 3 months   [place], FNP

## 2013-07-14 LAB — NMR, LIPOPROFILE
Cholesterol: 229 mg/dL — ABNORMAL HIGH (ref <200)
HDL Cholesterol by NMR: 24 mg/dL — ABNORMAL LOW (ref >=40)
HDL PARTICLE NUMBER: 19.7 umol/L — AB (ref >=30.5)
LDL Particle Number: 1040 nmol/L — ABNORMAL HIGH (ref <1000)
LDL Size: 21.3 nm (ref >20.5)
LP-IR SCORE: 66 — AB (ref <=45)
Small LDL Particle Number: 484 nmol/L (ref <=527)
Triglycerides by NMR: 466 mg/dL — ABNORMAL HIGH (ref <150)

## 2013-07-14 LAB — CMP14+EGFR
ALK PHOS: 83 IU/L (ref 39–117)
ALT: 11 IU/L (ref 0–44)
AST: 10 IU/L (ref 0–40)
Albumin/Globulin Ratio: 1.5 (ref 1.1–2.5)
Albumin: 3.2 g/dL — ABNORMAL LOW (ref 3.6–4.8)
BUN / CREAT RATIO: 14 (ref 10–22)
BUN: 28 mg/dL — ABNORMAL HIGH (ref 8–27)
CHLORIDE: 98 mmol/L (ref 97–108)
CO2: 22 mmol/L (ref 18–29)
CREATININE: 2.07 mg/dL — AB (ref 0.76–1.27)
Calcium: 8.6 mg/dL (ref 8.6–10.2)
GFR calc Af Amer: 38 mL/min/{1.73_m2} — ABNORMAL LOW (ref >59)
GFR calc non Af Amer: 33 mL/min/{1.73_m2} — ABNORMAL LOW (ref >59)
Globulin, Total: 2.2 g/dL (ref 1.5–4.5)
Glucose: 217 mg/dL — ABNORMAL HIGH (ref 65–99)
POTASSIUM: 4.7 mmol/L (ref 3.5–5.2)
SODIUM: 134 mmol/L (ref 134–144)
Total Bilirubin: <0.2 mg/dL (ref 0.0–1.2)
Total Protein: 5.4 g/dL — ABNORMAL LOW (ref 6.0–8.5)

## 2013-07-14 LAB — MICROALBUMIN, URINE: Microalbumin, Urine: 1830.2 ug/mL — ABNORMAL HIGH (ref 0.0–17.0)

## 2013-07-15 ENCOUNTER — Encounter (HOSPITAL_COMMUNITY): Payer: Self-pay | Admitting: Emergency Medicine

## 2013-07-15 ENCOUNTER — Emergency Department (HOSPITAL_COMMUNITY): Payer: Medicaid Other

## 2013-07-15 ENCOUNTER — Emergency Department (HOSPITAL_COMMUNITY)
Admission: EM | Admit: 2013-07-15 | Discharge: 2013-07-15 | Disposition: A | Discharge status: Home / Self Care | Payer: Medicaid Other | Attending: Emergency Medicine | Admitting: Emergency Medicine

## 2013-07-15 DIAGNOSIS — F411 Generalized anxiety disorder: Secondary | ICD-10-CM | POA: Insufficient documentation

## 2013-07-15 DIAGNOSIS — R002 Palpitations: Secondary | ICD-10-CM | POA: Insufficient documentation

## 2013-07-15 DIAGNOSIS — M129 Arthropathy, unspecified: Secondary | ICD-10-CM | POA: Insufficient documentation

## 2013-07-15 DIAGNOSIS — F17200 Nicotine dependence, unspecified, uncomplicated: Secondary | ICD-10-CM | POA: Insufficient documentation

## 2013-07-15 DIAGNOSIS — I509 Heart failure, unspecified: Secondary | ICD-10-CM | POA: Insufficient documentation

## 2013-07-15 DIAGNOSIS — K219 Gastro-esophageal reflux disease without esophagitis: Secondary | ICD-10-CM | POA: Insufficient documentation

## 2013-07-15 DIAGNOSIS — D649 Anemia, unspecified: Secondary | ICD-10-CM | POA: Insufficient documentation

## 2013-07-15 DIAGNOSIS — I10 Essential (primary) hypertension: Secondary | ICD-10-CM | POA: Insufficient documentation

## 2013-07-15 DIAGNOSIS — Z872 Personal history of diseases of the skin and subcutaneous tissue: Secondary | ICD-10-CM | POA: Insufficient documentation

## 2013-07-15 DIAGNOSIS — S88119A Complete traumatic amputation at level between knee and ankle, unspecified lower leg, initial encounter: Secondary | ICD-10-CM | POA: Insufficient documentation

## 2013-07-15 DIAGNOSIS — E785 Hyperlipidemia, unspecified: Secondary | ICD-10-CM | POA: Insufficient documentation

## 2013-07-15 DIAGNOSIS — Z79899 Other long term (current) drug therapy: Secondary | ICD-10-CM | POA: Insufficient documentation

## 2013-07-15 DIAGNOSIS — N189 Chronic kidney disease, unspecified: Secondary | ICD-10-CM | POA: Insufficient documentation

## 2013-07-15 DIAGNOSIS — R0602 Shortness of breath: Secondary | ICD-10-CM

## 2013-07-15 DIAGNOSIS — R011 Cardiac murmur, unspecified: Secondary | ICD-10-CM | POA: Insufficient documentation

## 2013-07-15 DIAGNOSIS — N289 Disorder of kidney and ureter, unspecified: Secondary | ICD-10-CM | POA: Insufficient documentation

## 2013-07-15 DIAGNOSIS — Z7982 Long term (current) use of aspirin: Secondary | ICD-10-CM | POA: Insufficient documentation

## 2013-07-15 DIAGNOSIS — E119 Type 2 diabetes mellitus without complications: Secondary | ICD-10-CM | POA: Insufficient documentation

## 2013-07-15 LAB — BASIC METABOLIC PANEL
BUN: 27 mg/dL — ABNORMAL HIGH (ref 6–23)
CALCIUM: 8.6 mg/dL (ref 8.4–10.5)
CO2: 24 meq/L (ref 19–32)
Chloride: 97 mEq/L (ref 96–112)
Creatinine, Ser: 2.18 mg/dL — ABNORMAL HIGH (ref 0.50–1.35)
GFR calc Af Amer: 35 mL/min — ABNORMAL LOW (ref >90)
GFR calc non Af Amer: 30 mL/min — ABNORMAL LOW (ref >90)
GLUCOSE: 166 mg/dL — AB (ref 70–99)
POTASSIUM: 4 meq/L (ref 3.7–5.3)
SODIUM: 132 meq/L — AB (ref 137–147)

## 2013-07-15 LAB — CBC
HCT: 25.8 % — ABNORMAL LOW (ref 39.0–52.0)
Hemoglobin: 8.3 g/dL — ABNORMAL LOW (ref 13.0–17.0)
MCH: 29.3 pg (ref 26.0–34.0)
MCHC: 32.2 g/dL (ref 30.0–36.0)
MCV: 91.2 fL (ref 78.0–100.0)
PLATELETS: 283 10*3/uL (ref 150–400)
RBC: 2.83 MIL/uL — AB (ref 4.22–5.81)
RDW: 15.1 % (ref 11.5–15.5)
WBC: 7.3 10*3/uL (ref 4.0–10.5)

## 2013-07-15 LAB — TSH: TSH: 7.176 u[IU]/mL — AB (ref 0.350–4.500)

## 2013-07-15 LAB — TROPONIN I

## 2013-07-15 NOTE — ED Notes (Signed)
Pt alert & oriented x4, stable gait. Patient given discharge instructions, paperwork & prescription(s). Patient  instructed to stop at the registration desk to finish any additional paperwork. Patient verbalized understanding. Pt left department w/ no further questions. 

## 2013-07-15 NOTE — Discharge Instructions (Signed)
Your testing today shows several things  #1 anemia, your hemoglobin was similar to your last blood draw 2 days ago. This needs to be followed by her family doctor  #2 kidney problems, your kidney tests have been getting worse over the last couple of years, this needs to be followed by your doctor. The best thing you can do to preserve your kidneys is to get good control over your blood pressure and diabetes.  #3 extra heartbeats. This is not unusual for people to have occasional extra heartbeats that makes him feel like her heart is skipping a beat. If you should develop persistent palpitations that don't go away after more than 1 minute you should be seen by your doctor or the emergency department immediately.  #4 if he should develop worsening shortness of breath, fever or productive cough see your doctor or return to the hospital.  Please call your doctor for a followup appointment within 24-48 hours. When you talk to your doctor please let them know that you were seen in the emergency department and have them acquire all of your records so that they can discuss the findings with you and formulate a treatment plan to fully care for your new and ongoing problems.

## 2013-07-15 NOTE — ED Provider Notes (Signed)
CSN: [id number]     Arrival date & time 07/15/13  6962 History   First MD Initiated Contact with Patient 07/15/13 [phone]     Chief Complaint  Patient presents with  . 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 The patient presents with a chief complaint of palpitations. He states that for the last 3 nights he has had intermittent palpitations which he describes as his heart skipping a few beats, lasting less than 5 seconds and then resolving spontaneously. This evening he was awakened from sleep with similar symptoms but also had additional symptoms with shortness of breath. This has resolved now as well, he denies any chest pain at all, denies any swelling, denies any coughing or fevers and denies any new medication use. He was recently prescribed clonidine but has not yet started taking this medication. He has also denied using any cocaine, drinking any alcohol but does endorse using heavy amounts of tobacco and caffeine. He does not use any over-the-counter medications. He has no history of thyroid dysfunction.  Patient is a 63 y.o. male presenting with palpitations. The history is provided by the patient, the spouse and medical records.  Palpitations   Past Medical History  Diagnosis Date  . Diverticulosis 03/04/10  . Peripheral vascular disease, unspecified   . Undiagnosed cardiac murmurs   . Other symptoms involving cardiovascular system   . HLD (hyperlipidemia)   . HTN (hypertension)   . Type II or unspecified type diabetes mellitus without mention of complication, not stated as uncontrolled   . Ulcer   . Carotid artery occlusion   . Umbilical hernia now    has not been repaired  . Blood transfusion   . AVM (arteriovenous malformation)  of colon with hemorrhage   . Gastritis   . Internal hemorrhoids   . Right carotid bruit   . Shortness of breath     noted /w low Hgb  . GERD (gastroesophageal reflux disease)   . Chronic kidney disease     renal failure /w osteomyelitis- 2010  . Arthritis     back  . Anemia     8/3,4,5- blood transfusion- [facility], colonoscopy- done & found 2 areas of bleeding   . Tobacco use disorder   . Anxiety   . CHF (congestive heart failure)     3 yrs ago  . Irregular heart beat    Past Surgical History  Procedure Laterality Date  . Colonoscopy  03/04/2010    Dr. Princella Pellegrini AMV without mention of ablation, scattered diverticula, internal hemorrhoids  . Bilateral common superficial femoral and profudus artery endarterectomies      2003    Dr. Sherren Mocha Early  . Esophagogastroduodenoscopy  03/2000    Dr. Tharon Aquas gastritis, H.Pylori gastritis, ?treated  . Esophagogastroduodenoscopy  05/2006    Dr. Marcello Fennel recurrent GI bleed. antral gastritis, single gastric AVM s/p APC, CLO results?  . Colonoscopy  11/2004    Dr. Barkley Bruns  . 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 Both of these are ablated with argon plasma coagulator./ Small external hemorrhoids  . Esophagogastroduodenoscopy  01/21/2012    SLF: MILD TO MAODERATE Gastritis/ SLOW GIB LIKEY DUE TO PT BEING ON ASA  ND SUBSEQUENT BLOOD/ LOSS FROM AVMS IN STOMACH AND COLON AS WELL AS GASTRITIS  . Enteroscopy  01/21/2012    Procedure: ENTEROSCOPY;  Surgeon: Danie Binder, MD;  Location: [facility];  Service: Endoscopy;;  . No past surgeries    . Vascular surgery      rt bka and rt 1st toe amputation  . Below knee leg amputation      2010, wears prosthesis   . Endarterectomy  02/09/2012    Procedure: ENDARTERECTOMY CAROTID;  Surgeon: Rosetta Posner, MD;  Location: [facility] OR;  Service:  Vascular;  Laterality: Left;  . Carotid endarterectomy Left 02-09-12   Family History  Problem Relation Age of Onset  . Colon cancer Brother 14    deceased  . Hyperlipidemia Brother   . Hypertension Brother   . Lung cancer Sister     deceased, three primary cancers: lung, esophageal, lymphoma.   . Diabetes Sister   . Hypertension Sister   . Cancer Father     gallbladder  . Hyperlipidemia Father   . Hypertension Father   . Cancer Brother     unknown primary  . Cancer Sister     unknown primary  . Hyperlipidemia Sister   . Diabetes Mother   . Hypertension Mother    History  Substance Use Topics  . Smoking status: Current Every Day Smoker -- 1.50 packs/day for 50 years    Types: Cigarettes  . Smokeless tobacco: Former Systems developer    Quit date: 02/08/2012  . Alcohol Use: No    Review of Systems  Cardiovascular: Positive for palpitations.  All other systems reviewed and are negative.    Allergies  Zocor; Hctz; Iodine; and Sulfa antibiotics  Home Medications   Current Outpatient Rx  Name  Route  Sig  Dispense  Refill  . amLODipine (NORVASC) 10 MG tablet   Oral   Take 1 tablet (10 mg total) by mouth daily before breakfast.   30 tablet   5   . aspirin 325 MG tablet   Oral   Take 325 mg by mouth every morning. Take 1/2 tab qam         . atenolol (TENORMIN) 100 MG tablet   Oral   Take 1 tablet (100 mg total) by mouth daily.   30 tablet   5   . cloNIDine (CATAPRES) 0.1 MG tablet   Oral   Take 1 tablet (0.1 mg total) by mouth 2 (two) times daily.   60 tablet   5   . Fe Cbn-Fe Gluc-FA-B12-C-DSS (FERRALET 90) 90-1 MG TABS   Oral   Take 1 tablet by mouth daily.   30 each   0   . furosemide (LASIX) 40 MG tablet   Oral   Take 1 tablet (40 mg total) by mouth as needed.   30 tablet   5   . glipiZIDE (GLUCOTROL) 10 MG tablet      1 1/2 po qd   45 tablet   5   . naproxen sodium (ANAPROX) 220 MG tablet   Oral   Take 220 mg by mouth 2 (two) times daily as  needed.         . pantoprazole (PROTONIX) 20 MG tablet   Oral   Take 1 tablet (20 mg total) by mouth daily.   30 tablet   5   . silver sulfADIAZINE (SILVADENE) 1 % cream      APPLY TO AFFECTED AREA TWICE A DAY   240  g   0   . simvastatin (ZOCOR) 40 MG tablet   Oral   Take 1 tablet (40 mg total) by mouth at bedtime.   30 tablet   5   . valsartan (DIOVAN) 320 MG tablet   Oral   Take 1 tablet (320 mg total) by mouth daily.   30 tablet   5    BP 138/62  Pulse 96  Temp(Src) 98.4 F (36.9 C) (Oral)  Resp 22  Ht 5\' 4"  (1.626 m)  Wt 180 lb (81.647 kg)  BMI 30.88 kg/m2  SpO2 95% Physical Exam  Nursing note and vitals reviewed. Constitutional: He appears well-developed and well-nourished. No distress.  HENT:  Head: Normocephalic and atraumatic.  Mouth/Throat: Oropharynx is clear and moist. No oropharyngeal exudate.  Eyes: Conjunctivae and EOM are normal. Pupils are equal, round, and reactive to light. Right eye exhibits no discharge. Left eye exhibits no discharge. No scleral icterus.  Neck: Normal range of motion. Neck supple. No JVD present. No thyromegaly present.  Cardiovascular: Normal rate, regular rhythm and intact distal pulses.  Exam reveals no gallop and no friction rub.   Murmur (soft) heard. Occasional ectopy  Pulmonary/Chest: Effort normal and breath sounds normal. No respiratory distress. He has no wheezes. He has no rales.  Abdominal: Soft. Bowel sounds are normal. He exhibits no distension and no mass. There is no tenderness.  Musculoskeletal: Normal range of motion. He exhibits no edema and no tenderness.  Lymphadenopathy:    He has no cervical adenopathy.  Neurological: He is alert. Coordination normal.  Skin: Skin is warm and dry. No rash noted. No erythema.  Psychiatric: He has a normal mood and affect. His behavior is normal.    ED Course  Procedures (including critical care time) Labs Review Labs Reviewed  CBC - Abnormal; Notable for the  following:    RBC 2.83 (*)    Hemoglobin 8.3 (*)    HCT 25.8 (*)    All other components within normal limits  BASIC METABOLIC PANEL - Abnormal; Notable for the following:    Sodium 132 (*)    Glucose, Bld 166 (*)    BUN 27 (*)    Creatinine, Ser 2.18 (*)    GFR calc non Af Amer 30 (*)    GFR calc Af Amer 35 (*)    All other components within normal limits  TROPONIN I  TSH   Imaging Review Dg Chest 2 View  07/15/2013   CLINICAL DATA:  Shortness of breath and palpitations.  EXAM: CHEST  2 VIEW  COMPARISON:  Chest radiograph from 01/19/2012  FINDINGS: The lungs are well-aerated. Bilateral perihilar airspace opacification raises concern for multifocal pneumonia, possibly atypical in nature. The appearance is somewhat less typical for interstitial edema. No pleural effusion or pneumothorax is seen. There is no evidence of pleural effusion or pneumothorax.  The heart is normal in size; the mediastinal contour is within normal limits. No acute osseous abnormalities are seen.  IMPRESSION: Bilateral perihilar airspace opacification raises concern for multifocal pneumonia, possibly atypical in nature. The appearance is somewhat less typical for interstitial edema, though it remains a possibility.   Electronically Signed   By: Garald Balding M.D.   On: 07/15/2013 04:41    EKG Interpretation    Date/Time:  Sunday July 15 2013 03:29:25 EST Ventricular Rate:  98 PR Interval:  164 QRS Duration: 94 QT Interval:  362 QTC Calculation: 462 R Axis:   28 Text Interpretation:  Sinus rhythm with  Premature supraventricular complexes Otherwise normal ECG When compared with ECG of 19-Jan-2012 13:46, Premature supraventricular complexes are now Present Confirmed by Rulon Abdalla  MD, Fajr Fife ([id number]) on 07/15/2013 4:43:58 AM            MDM   1. Palpitations   2. Shortness of breath   3. Anemia   4. Renal insufficiency    Vital signs are unremarkable, EKG without ischemic findings, occasional PACs  present. I discussed the workup with the patient, we'll proceed with labs to rule out anemia as the patient has had a history of a significant GI bleed in the past and reports requiring transfusion. We'll also check potassium and renal function, chest x-ray. The patient is not short of breath, has no chest pain, has no fever or chills and has vital signs which are reassuring.  The labs show several abnormalities including renal insufficiency, anemia and abnormal chest x-ray. I have discussed all of these findings with the patient. Compared to the blood draw 2 days ago at the patient's doctor's office there is no significant change in these values. The chest x-ray shows a faint patchy pattern, the patient specifically states he has not been having fevers, has not had increased coughing and does not feel shortness of breath at this time. His shortness of breath was very short-lived when he awoke from sleep this morning. I do not believe that he has a clinical pulmonary infection and can followup with his family doctor for the above findings. These were all explained to the patient, he has expressed his understanding.     Johnna Acosta, MD 07/15/13 [phone]

## 2013-07-15 NOTE — ED Notes (Signed)
Pt reports having palpitations that started tonight.

## 2013-07-16 ENCOUNTER — Telehealth: Payer: Self-pay | Admitting: Nurse Practitioner

## 2013-07-16 ENCOUNTER — Telehealth: Payer: Self-pay | Admitting: *Deleted

## 2013-07-16 NOTE — Telephone Encounter (Signed)
Patient aware of lab results.

## 2013-07-18 ENCOUNTER — Other Ambulatory Visit: Payer: Self-pay | Admitting: Nurse Practitioner

## 2013-07-19 NOTE — Telephone Encounter (Signed)
Looks like a different med on med list. Please advise

## 2013-07-20 ENCOUNTER — Telehealth: Payer: Self-pay | Admitting: Nurse Practitioner

## 2013-07-20 ENCOUNTER — Other Ambulatory Visit (INDEPENDENT_AMBULATORY_CARE_PROVIDER_SITE_OTHER): Payer: Medicaid Other

## 2013-07-20 DIAGNOSIS — D649 Anemia, unspecified: Secondary | ICD-10-CM

## 2013-07-20 LAB — POCT HEMOGLOBIN: HEMOGLOBIN: 8.1 g/dL — AB (ref 14.1–18.1)

## 2013-07-20 NOTE — Telephone Encounter (Signed)
Iron rich foods- continue iron tablets daily

## 2013-07-23 NOTE — Telephone Encounter (Signed)
Patients wife aware

## 2013-07-24 ENCOUNTER — Telehealth: Payer: Self-pay | Admitting: Family Medicine

## 2013-07-24 NOTE — Telephone Encounter (Signed)
Just panic attacks at night- is this something new

## 2013-07-24 NOTE — Telephone Encounter (Signed)
Patients wife said that he is jittery on the inside wakes up at night not breathing good and his arms are cold and numb. toold patient they  Needed to go to the ER said they already have and they called vascular and told them to call us to see if it was his colndine making him fell this way

## 2013-07-25 NOTE — Telephone Encounter (Signed)
Spoke with patient wife.

## 2013-07-26 ENCOUNTER — Ambulatory Visit (INDEPENDENT_AMBULATORY_CARE_PROVIDER_SITE_OTHER): Payer: Medicaid Other | Admitting: Nurse Practitioner

## 2013-07-26 ENCOUNTER — Encounter: Payer: Self-pay | Admitting: Nurse Practitioner

## 2013-07-26 VITALS — BP 156/77 | HR 93 | Temp 98.0°F | Wt 184.0 lb

## 2013-07-26 DIAGNOSIS — R0602 Shortness of breath: Secondary | ICD-10-CM

## 2013-07-26 DIAGNOSIS — D649 Anemia, unspecified: Secondary | ICD-10-CM

## 2013-07-26 LAB — POCT HEMOGLOBIN: Hemoglobin: 7.7 g/dL — AB (ref 14.1–18.1)

## 2013-07-26 NOTE — Progress Notes (Signed)
   Subjective:    Patient ID: [name], male    DOB: [birth year], 63 y.o.   MRN: [id number]  HPI Patient was experiencing SOB yesterday and was real horace. He had some lasix at home that he doesn't take everyday- Took one last night an dthis morning he feels fine. No SOB.    Review of Systems  Constitutional: Negative.   HENT: Negative.   Respiratory: Positive for shortness of breath (yesterday).   Cardiovascular: Negative.   Gastrointestinal: Negative.   Genitourinary: Negative.   Musculoskeletal: Negative.   All other systems reviewed and are negative.       Objective:   Physical Exam  Constitutional: He is oriented to person, place, and time. He appears well-developed and well-nourished.  Cardiovascular: Normal rate, regular rhythm and normal heart sounds.   Pulmonary/Chest: Effort normal and breath sounds normal.  Neurological: He is alert and oriented to person, place, and time.  Skin: Skin is warm and dry.  Psychiatric: He has a normal mood and affect. His behavior is normal. Judgment and thought content normal.    BP 156/77  Pulse 93  Temp(Src) 98 F (36.7 C) (Oral)  Wt 184 lb (83.462 kg)       Assessment & Plan:   1. SOB (shortness of breath)   2. Anemia  Continue ferrous medication BID Take lasix daily RTO prn  [name] Done, FNP

## 2013-07-26 NOTE — Patient Instructions (Signed)

## 2013-08-01 ENCOUNTER — Observation Stay (HOSPITAL_COMMUNITY): Payer: Medicaid Other

## 2013-08-01 ENCOUNTER — Other Ambulatory Visit (INDEPENDENT_AMBULATORY_CARE_PROVIDER_SITE_OTHER): Payer: Medicaid Other

## 2013-08-01 ENCOUNTER — Observation Stay (HOSPITAL_COMMUNITY)
Admission: AD | Admit: 2013-08-01 | Discharge: 2013-08-02 | Disposition: A | Discharge status: Home / Self Care | Payer: Medicaid Other | Source: Other Acute Inpatient Hospital | Attending: Internal Medicine | Admitting: Internal Medicine

## 2013-08-01 ENCOUNTER — Encounter (HOSPITAL_COMMUNITY): Payer: Self-pay | Admitting: *Deleted

## 2013-08-01 DIAGNOSIS — E1149 Type 2 diabetes mellitus with other diabetic neurological complication: Secondary | ICD-10-CM

## 2013-08-01 DIAGNOSIS — K219 Gastro-esophageal reflux disease without esophagitis: Secondary | ICD-10-CM | POA: Diagnosis present

## 2013-08-01 DIAGNOSIS — Z23 Encounter for immunization: Secondary | ICD-10-CM | POA: Insufficient documentation

## 2013-08-01 DIAGNOSIS — E119 Type 2 diabetes mellitus without complications: Secondary | ICD-10-CM | POA: Diagnosis present

## 2013-08-01 DIAGNOSIS — R7989 Other specified abnormal findings of blood chemistry: Secondary | ICD-10-CM

## 2013-08-01 DIAGNOSIS — I6529 Occlusion and stenosis of unspecified carotid artery: Secondary | ICD-10-CM | POA: Insufficient documentation

## 2013-08-01 DIAGNOSIS — D62 Acute posthemorrhagic anemia: Secondary | ICD-10-CM | POA: Diagnosis present

## 2013-08-01 DIAGNOSIS — F17200 Nicotine dependence, unspecified, uncomplicated: Secondary | ICD-10-CM | POA: Diagnosis present

## 2013-08-01 DIAGNOSIS — N184 Chronic kidney disease, stage 4 (severe): Secondary | ICD-10-CM | POA: Diagnosis present

## 2013-08-01 DIAGNOSIS — N189 Chronic kidney disease, unspecified: Secondary | ICD-10-CM

## 2013-08-01 DIAGNOSIS — K922 Gastrointestinal hemorrhage, unspecified: Secondary | ICD-10-CM | POA: Diagnosis present

## 2013-08-01 DIAGNOSIS — F411 Generalized anxiety disorder: Secondary | ICD-10-CM | POA: Insufficient documentation

## 2013-08-01 DIAGNOSIS — E785 Hyperlipidemia, unspecified: Secondary | ICD-10-CM | POA: Diagnosis present

## 2013-08-01 DIAGNOSIS — F419 Anxiety disorder, unspecified: Secondary | ICD-10-CM | POA: Diagnosis present

## 2013-08-01 DIAGNOSIS — I129 Hypertensive chronic kidney disease with stage 1 through stage 4 chronic kidney disease, or unspecified chronic kidney disease: Secondary | ICD-10-CM | POA: Insufficient documentation

## 2013-08-01 DIAGNOSIS — I10 Essential (primary) hypertension: Secondary | ICD-10-CM | POA: Diagnosis present

## 2013-08-01 DIAGNOSIS — K5900 Constipation, unspecified: Secondary | ICD-10-CM | POA: Insufficient documentation

## 2013-08-01 DIAGNOSIS — I739 Peripheral vascular disease, unspecified: Secondary | ICD-10-CM | POA: Insufficient documentation

## 2013-08-01 DIAGNOSIS — K5521 Angiodysplasia of colon with hemorrhage: Secondary | ICD-10-CM

## 2013-08-01 DIAGNOSIS — I509 Heart failure, unspecified: Secondary | ICD-10-CM | POA: Diagnosis present

## 2013-08-01 DIAGNOSIS — K648 Other hemorrhoids: Secondary | ICD-10-CM | POA: Insufficient documentation

## 2013-08-01 DIAGNOSIS — Z7982 Long term (current) use of aspirin: Secondary | ICD-10-CM | POA: Insufficient documentation

## 2013-08-01 DIAGNOSIS — K429 Umbilical hernia without obstruction or gangrene: Secondary | ICD-10-CM | POA: Insufficient documentation

## 2013-08-01 DIAGNOSIS — I2510 Atherosclerotic heart disease of native coronary artery without angina pectoris: Secondary | ICD-10-CM | POA: Diagnosis present

## 2013-08-01 DIAGNOSIS — E1142 Type 2 diabetes mellitus with diabetic polyneuropathy: Secondary | ICD-10-CM

## 2013-08-01 DIAGNOSIS — D649 Anemia, unspecified: Secondary | ICD-10-CM

## 2013-08-01 LAB — CBC
HCT: 23.3 % — ABNORMAL LOW (ref 39.0–52.0)
Hemoglobin: 7.2 g/dL — ABNORMAL LOW (ref 13.0–17.0)
MCH: 27.9 pg (ref 26.0–34.0)
MCHC: 30.9 g/dL (ref 30.0–36.0)
MCV: 90.3 fL (ref 78.0–100.0)
PLATELETS: 299 10*3/uL (ref 150–400)
RBC: 2.58 MIL/uL — AB (ref 4.22–5.81)
RDW: 15.8 % — ABNORMAL HIGH (ref 11.5–15.5)
WBC: 5.4 10*3/uL (ref 4.0–10.5)

## 2013-08-01 LAB — PRO B NATRIURETIC PEPTIDE: Pro B Natriuretic peptide (BNP): 2457 pg/mL — ABNORMAL HIGH (ref 0–125)

## 2013-08-01 LAB — BASIC METABOLIC PANEL
BUN: 27 mg/dL — ABNORMAL HIGH (ref 6–23)
CALCIUM: 8.7 mg/dL (ref 8.4–10.5)
CO2: 26 meq/L (ref 19–32)
Chloride: 98 mEq/L (ref 96–112)
Creatinine, Ser: 2.26 mg/dL — ABNORMAL HIGH (ref 0.50–1.35)
GFR, EST AFRICAN AMERICAN: 34 mL/min — AB (ref >90)
GFR, EST NON AFRICAN AMERICAN: 29 mL/min — AB (ref >90)
Glucose, Bld: 148 mg/dL — ABNORMAL HIGH (ref 70–99)
Potassium: 4.9 mEq/L (ref 3.7–5.3)
SODIUM: 136 meq/L — AB (ref 137–147)

## 2013-08-01 LAB — GLUCOSE, CAPILLARY
Glucose-Capillary: 135 mg/dL — ABNORMAL HIGH (ref 70–99)
Glucose-Capillary: 185 mg/dL — ABNORMAL HIGH (ref 70–99)

## 2013-08-01 LAB — IRON AND TIBC
IRON: 21 ug/dL — AB (ref 42–135)
SATURATION RATIOS: 7 % — AB (ref 20–55)
TIBC: 323 ug/dL (ref 215–435)
UIBC: 302 ug/dL (ref 125–400)

## 2013-08-01 LAB — RETICULOCYTES
RBC.: 2.58 MIL/uL — ABNORMAL LOW (ref 4.22–5.81)
RETIC COUNT ABSOLUTE: 118.7 10*3/uL (ref 19.0–186.0)
RETIC CT PCT: 4.6 % — AB (ref 0.4–3.1)

## 2013-08-01 LAB — LIPID PANEL
Cholesterol: 125 mg/dL (ref 0–200)
HDL: 30 mg/dL — ABNORMAL LOW (ref >39)
LDL CALC: 36 mg/dL (ref 0–99)
Total CHOL/HDL Ratio: 4.2 RATIO
Triglycerides: 293 mg/dL — ABNORMAL HIGH (ref <150)
VLDL: 59 mg/dL — ABNORMAL HIGH (ref 0–40)

## 2013-08-01 LAB — POCT HEMOGLOBIN: Hemoglobin: 7.3 g/dL — AB (ref 14.1–18.1)

## 2013-08-01 LAB — PREPARE RBC (CROSSMATCH)

## 2013-08-01 MED ORDER — INSULIN ASPART 100 UNIT/ML ~~LOC~~ SOLN
0.0000 [IU] | Freq: Three times a day (TID) | SUBCUTANEOUS | Status: DC
  Administered 2013-08-01: 1 [IU] via SUBCUTANEOUS
  Administered 2013-08-02: 2 [IU] via SUBCUTANEOUS

## 2013-08-01 MED ORDER — HYDROCODONE-ACETAMINOPHEN 5-325 MG PO TABS: 1.0000 | ORAL_TABLET | ORAL | Status: DC | PRN

## 2013-08-01 MED ORDER — INFLUENZA VAC SPLIT QUAD 0.5 ML IM SUSP
0.5000 mL | INTRAMUSCULAR | Status: AC
  Administered 2013-08-02: 0.5 mL via INTRAMUSCULAR
  Filled 2013-08-01: qty 0.5

## 2013-08-01 MED ORDER — ONDANSETRON HCL 4 MG/2ML IJ SOLN: 4.0000 mg | Freq: Four times a day (QID) | INTRAMUSCULAR | Status: DC | PRN

## 2013-08-01 MED ORDER — PANTOPRAZOLE SODIUM 40 MG IV SOLR
40.0000 mg | Freq: Every day | INTRAVENOUS | Status: DC
  Administered 2013-08-01 – 2013-08-02 (×2): 40 mg via INTRAVENOUS
  Filled 2013-08-01 (×2): qty 40

## 2013-08-01 MED ORDER — IRBESARTAN 75 MG PO TABS
75.0000 mg | ORAL_TABLET | Freq: Every day | ORAL | Status: DC
  Filled 2013-08-01: qty 1

## 2013-08-01 MED ORDER — SIMVASTATIN 20 MG PO TABS
40.0000 mg | ORAL_TABLET | Freq: Every day | ORAL | Status: DC
  Filled 2013-08-01: qty 2

## 2013-08-01 MED ORDER — SODIUM CHLORIDE 0.9 % IV SOLN: INTRAVENOUS | Status: DC

## 2013-08-01 MED ORDER — ACETAMINOPHEN 325 MG PO TABS: 650.0000 mg | ORAL_TABLET | Freq: Four times a day (QID) | ORAL | Status: DC | PRN

## 2013-08-01 MED ORDER — ALUM & MAG HYDROXIDE-SIMETH 200-200-20 MG/5ML PO SUSP: 30.0000 mL | Freq: Four times a day (QID) | ORAL | Status: DC | PRN

## 2013-08-01 MED ORDER — INSULIN ASPART 100 UNIT/ML ~~LOC~~ SOLN: 0.0000 [IU] | Freq: Every day | SUBCUTANEOUS | Status: DC

## 2013-08-01 MED ORDER — AMLODIPINE BESYLATE 5 MG PO TABS
10.0000 mg | ORAL_TABLET | Freq: Every day | ORAL | Status: DC
  Administered 2013-08-02: 10 mg via ORAL
  Filled 2013-08-01: qty 2

## 2013-08-01 MED ORDER — ATENOLOL 25 MG PO TABS
100.0000 mg | ORAL_TABLET | Freq: Every day | ORAL | Status: DC
  Administered 2013-08-01: 100 mg via ORAL
  Filled 2013-08-01: qty 4

## 2013-08-01 MED ORDER — ONDANSETRON HCL 4 MG PO TABS: 4.0000 mg | ORAL_TABLET | Freq: Four times a day (QID) | ORAL | Status: DC | PRN

## 2013-08-01 MED ORDER — ATENOLOL 25 MG PO TABS
50.0000 mg | ORAL_TABLET | Freq: Two times a day (BID) | ORAL | Status: DC
  Administered 2013-08-02: 50 mg via ORAL
  Filled 2013-08-01 (×2): qty 2

## 2013-08-01 MED ORDER — CLONIDINE HCL 0.1 MG PO TABS
0.1000 mg | ORAL_TABLET | Freq: Two times a day (BID) | ORAL | Status: DC
  Filled 2013-08-01 (×2): qty 1

## 2013-08-01 MED ORDER — ACETAMINOPHEN 650 MG RE SUPP: 650.0000 mg | Freq: Four times a day (QID) | RECTAL | Status: DC | PRN

## 2013-08-01 NOTE — Consult Note (Signed)
Reason for Sims Referring Physician: Shelah Lewandowsky NP [facility] [name] is an 63 y.o. male.  Hx HTN, PVD, DM2 since age 30, Heart murmur.  HPI: Directly admitted to Hospitalist services. Wife tells me 4 weeks ago his hemoglobin was 8.1. For the past 3 weeks his hemoglobin has been dropping.  Today his POCT Hemopglobin is 7.3. He has been taking Iron 325mg  daily for 4 weeks. He has been SOB and resolved after taking Lasix. He has had black stools but started after taking the Iron.  His stools were brown before taking the iron.  He denies seeing blood in his stools.  Up until 4 weeks ago he states he was fine. He has been ASA 325mg  x 2 yrs. He cut ASA to 160mg  for several months. Changed to ASA 81mg  first of this month. He has not been taken Anaprox.  Appetite has been good. No weight loss. No hematemesis.  No abdominal pain.  Hx of GIB in past.   Colonoscopy 01/2012 DR. 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 Both of these are ablated with argon plasma coagulator.  Small external hemorrhoids.   01/21/2012 EGD with Biopsy Small bowel endoscopy: fatigue malena. ENDOSCOPIC IMPRESSION:  1) MILD TO MAODERATE Gastritis  2) SLOW GIB LIKEY DUE TO PT BEING ON ASA ND SUBSEQUENT BLOOD  LOSS FROM AVMS IN STOMACH AND COLON AS WELL AS GASTRITIS Biopsy: MIld chronic gastritis. (Antral mucosa).  02/2010 Colonoscopy Dr. Sharlett Iles (High risk). (Brother deceased from colon cancer age 44). COMPLICATIONS: None  ENDOSCOPIC IMPRESSION:  1) Av malformation in the cecum  2) Diverticula, scattered in the sigmoid colon  3) Internal hemorrhoids in the rectum  4) No polyps or cancers    Past Medical History  Diagnosis Date  . Diverticulosis 03/04/10  . Peripheral vascular disease, unspecified   . Undiagnosed cardiac murmurs   . Other symptoms involving cardiovascular system   . HLD (hyperlipidemia)   . HTN  (hypertension)   . Type II or unspecified type diabetes mellitus without mention of complication, not stated as uncontrolled   . Ulcer   . Carotid artery occlusion   . Umbilical hernia now    has not been repaired  . Blood transfusion   . AVM (arteriovenous malformation) of colon with hemorrhage   . Gastritis   . Internal hemorrhoids   . Right carotid bruit   . Shortness of breath     noted /w low Hgb  . GERD (gastroesophageal reflux disease)   . Chronic kidney disease     renal failure /w osteomyelitis- 2010  . Arthritis     back  . Anemia     8/3,4,5- blood transfusion- [facility], colonoscopy- done & found 2 areas of bleeding   . Tobacco use disorder   . Anxiety   . CHF (congestive heart failure)     3 yrs ago  . Irregular heart beat     Past Surgical History  Procedure Laterality Date  . Colonoscopy  03/04/2010    Dr. Princella Pellegrini AMV without mention of ablation, scattered diverticula, internal hemorrhoids  . Bilateral common superficial femoral and profudus artery endarterectomies      2003    Dr. Sherren Mocha Early  . Esophagogastroduodenoscopy  03/2000    Dr. Tharon Aquas gastritis, H.Pylori gastritis, ?treated  . Esophagogastroduodenoscopy  05/2006    Dr. Marcello Fennel recurrent GI bleed. antral gastritis, single gastric AVM s/p APC, CLO results?  . Colonoscopy  11/2004    Dr. Barkley Bruns  . 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 Both of these are ablated with argon plasma coagulator./ Small external hemorrhoids  . Esophagogastroduodenoscopy  01/21/2012    SLF: MILD TO MAODERATE Gastritis/ SLOW GIB LIKEY DUE TO PT BEING ON ASA ND SUBSEQUENT BLOOD/ LOSS FROM AVMS IN STOMACH AND COLON AS WELL AS GASTRITIS  . Enteroscopy  01/21/2012    Procedure: ENTEROSCOPY;  Surgeon: Danie Binder, MD;  Location: [facility];  Service:  Endoscopy;;  . No past surgeries    . Vascular surgery      rt bka and rt 1st toe amputation  . Below knee leg amputation      2010, wears prosthesis   . Endarterectomy  02/09/2012    Procedure: ENDARTERECTOMY CAROTID;  Surgeon: Rosetta Posner, MD;  Location: [facility] OR;  Service: Vascular;  Laterality: Left;  . Carotid endarterectomy Left 02-09-12    Family History  Problem Relation Age of Onset  . Colon cancer Brother 73    deceased  . Hyperlipidemia Brother   . Hypertension Brother   . Lung cancer Sister     deceased, three primary cancers: lung, esophageal, lymphoma.   . Diabetes Sister   . Hypertension Sister   . Cancer Father     gallbladder  . Hyperlipidemia Father   . Hypertension Father   . Cancer Brother     unknown primary  . Cancer Sister     unknown primary  . Hyperlipidemia Sister   . Diabetes Mother   . Hypertension Mother     Social History:  reports that he has been smoking Cigarettes.  He has a 100 pack-year smoking history. He quit smokeless tobacco use about 17 months ago. He reports that he does not drink alcohol or use illicit drugs.  Allergies:  Allergies  Allergen Reactions  . Zocor [Simvastatin] Palpitations    Chest soreness  . Hctz [Hydrochlorothiazide]     Leg cramps  . Iodine Hives    IVP dye  . Sulfa Antibiotics Other (See Comments)    Unknown     Medications: I have reviewed the patient's current medications.  Results for orders placed in visit on 08/01/13 (from the past 48 hour(s))  POCT HEMOGLOBIN     Status: Abnormal   Collection Time    08/01/13 11:27 AM      Result Value Ref Range   Hemoglobin 7.3 (*) 14.1 - 18.1 g/dL    No results found.  ROS Blood pressure 163/69, pulse 86, temperature 98.6 F (37 C), temperature source Oral, resp. rate 18, SpO2 100.00%. Physical Exam Alert and oriented. Skin warm and dry. Oral mucosa is moist.   . Sclera anicteric, conjunctivae is pink. Thyroid not enlarged. No cervical lymphadenopathy.  Lungs clear. Heart regular rate and rhythm.  Abdomen is soft. Bowel sounds are positive. No hepatomegaly. No abdominal masses felt. No tenderness.  No edema toleft lower extremities. 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  No stool in rectum. Guaiac negative. Assessment/Plan: Probable GI bleed from AVM. I will discuss with Dr. Laural Golden.  Full liquid diet today, NPO after midnight Agree with Protonix 40mg  IV  SETZER,TERRI W 08/01/2013, 4:41 PM     GI attending note; Patient interviewed and examined. Patient with multiple medical problems presents with history of melena  and drop in his H&H. He has history of GI bleed for which he was evaluated in August 2013 with EGD and colonoscopy revealing gastric and right colonic AV malformations; these lesions are ablated with APC. He is on aspirin and may still be taking naproxen. Abdominal exam is benign other than the umbilicus hernia. Rectal examination revealed no stool in the vault but mucus is quite negative by Ms. Deberah Castle, NP. Since he has history of gastric AVMs he would benefit from EGD. Agree with plans to give him a unit of PRBCs and treated with PPI. Patient's care will be transferred to Dr. Oneida Alar in a.m.

## 2013-08-01 NOTE — H&P (Signed)
Patient seen and examined. Above note reviewed.  Patient was sent as a direct admit to any hospital from his primary care physician's office for low hemoglobin. He has a history of AVMs of colon and reports noticing dark stools for quite some time. He feels that these may be related to his iron supplements. He has required blood transfusions in the past for anemia, his last transfusion being approximately 2 years ago. Baseline hemoglobin appears around 8-9. At his primary care physician's office, he was noted to be 7.3. He's feeling generally weak, lightheaded. He did report some shortness of breath which began several days ago with associated lower extremity edema. He restarted his home dose of Lasix and reports that his shortness of breath has since resolved. Respiratory status appears to be at baseline. He'll be transfused 1 unit of PRBC and started on proton pump inhibitors. GI has been consulted and plans on endoscopy in the morning.  Cherri Yera

## 2013-08-01 NOTE — H&P (Signed)
[facility] History and Physical  DARROL BRANDENBURG GLO:[id number] DOB: 01-08-1951 DOA: 08/01/2013  Referring physician:  PCP: Redge Gainer, MD   Chief Complaint: abnormal labs  HPI: [name] is a very pleasant 63 y.o. male with a past medical history that includes CAD, hypertension, diabetes, AVM of:, Right kidney disease, anemia, tobacco use, CHF presents to room 332 from home with the chief complaint of abnormal labs. Information is obtained from the patient and his wife is at the bedside. They report that patient has had a low hemoglobin for the last 4 weeks. He reports that he's been going to his primary care provider on a weekly basis to have his hemoglobin checked and it has been between 7.5 and 8.1 he has been prescribed iron supplements during this time. On day of admission his hemoglobin was noted to be 7.3. In addition about a week ago he experienced some shortness of breath and was seen at his primary care physician's office. He was instructed to take his Lasix daily prior to that he has been taking Lasix on an as-needed basis. He reports that his breathing has improved since he's been taking the Lasix. Associated symptoms include some generalized weakness and early fatigue with occasional heart palpitations. He denies chest pain abdominal pain nausea vomiting constipation diarrhea. He does endorse some melena but denies hematochezia. He reports he had a colonoscopy 2 years ago and was told at that time "I had bleeding in my stomach and my intestines. 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 In addition he takes aspirin and Anaprox daily. On admission his vital signs are stable he is afebrile and not hypoxic.  Review of Systems:  10 point review of systems completed and all systems are negative except as indicated in the history of present illness   Past Medical History  Diagnosis Date  . Diverticulosis 03/04/10  . Peripheral  vascular disease, unspecified   . Undiagnosed cardiac murmurs   . Other symptoms involving cardiovascular system   . HLD (hyperlipidemia)   . HTN (hypertension)   . Type II or unspecified type diabetes mellitus without mention of complication, not stated as uncontrolled   . Ulcer   . Carotid artery occlusion   . Umbilical hernia now    has not been repaired  . Blood transfusion   . AVM (arteriovenous malformation) of colon with hemorrhage   . Gastritis   . Internal hemorrhoids   . Right carotid bruit   . Shortness of breath     noted /w low Hgb  . GERD (gastroesophageal reflux disease)   . Chronic kidney disease     renal failure /w osteomyelitis- 2010  . Arthritis     back  . Anemia     8/3,4,5- blood transfusion- [facility], colonoscopy- done & found 2 areas of bleeding   . Tobacco use disorder   . Anxiety   . CHF (congestive heart failure)     3 yrs ago  . Irregular heart beat    Past Surgical History  Procedure Laterality Date  . Colonoscopy  03/04/2010    Dr. Princella Pellegrini AMV without mention of ablation, scattered diverticula, internal hemorrhoids  . Bilateral common superficial femoral and profudus artery endarterectomies      2003    Dr. Sherren Mocha Early  . Esophagogastroduodenoscopy  03/2000    Dr. Tharon Aquas gastritis, H.Pylori gastritis, ?treated  . Esophagogastroduodenoscopy  05/2006    Dr. Marcello Fennel recurrent GI bleed. antral gastritis, single  gastric AVM s/p APC, CLO results?  . Colonoscopy  11/2004    Dr. Barkley Bruns  . 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 Both of these are ablated with argon plasma coagulator./ Small external hemorrhoids  . Esophagogastroduodenoscopy  01/21/2012    SLF: MILD TO MAODERATE Gastritis/ SLOW GIB LIKEY DUE TO PT BEING ON ASA ND SUBSEQUENT BLOOD/ LOSS FROM AVMS IN STOMACH AND  COLON AS WELL AS GASTRITIS  . Enteroscopy  01/21/2012    Procedure: ENTEROSCOPY;  Surgeon: Danie Binder, MD;  Location: [facility];  Service: Endoscopy;;  . No past surgeries    . Vascular surgery      rt bka and rt 1st toe amputation  . Below knee leg amputation      2010, wears prosthesis   . Endarterectomy  02/09/2012    Procedure: ENDARTERECTOMY CAROTID;  Surgeon: Rosetta Posner, MD;  Location: [facility];  Service: Vascular;  Laterality: Left;  . Carotid endarterectomy Left 02-09-12   Social History:  reports that he has been smoking Cigarettes.  He has a 100 pack-year smoking history. He quit smokeless tobacco use about 17 months ago. He reports that he does not drink alcohol or use illicit drugs.  Allergies  Allergen Reactions  . Zocor [Simvastatin] Palpitations    Chest soreness  . Hctz [Hydrochlorothiazide]     Leg cramps  . Iodine Hives    IVP dye  . Sulfa Antibiotics Other (See Comments)    Unknown     Family History  Problem Relation Age of Onset  . Colon cancer Brother 64    deceased  . Hyperlipidemia Brother   . Hypertension Brother   . Lung cancer Sister     deceased, three primary cancers: lung, esophageal, lymphoma.   . Diabetes Sister   . Hypertension Sister   . Cancer Father     gallbladder  . Hyperlipidemia Father   . Hypertension Father   . Cancer Brother     unknown primary  . Cancer Sister     unknown primary  . Hyperlipidemia Sister   . Diabetes Mother   . Hypertension Mother      Prior to Admission medications   Medication Sig Start Date End Date Taking? Authorizing Provider  furosemide (LASIX) 40 MG tablet Take 1 tablet (40 mg total) by mouth as needed. 07/13/13  Yes [name] Done, FNP  iron polysaccharides (NIFEREX) 150 MG capsule Take 150 mg by mouth 2 (two) times daily.   Yes Historical Provider, MD  amLODipine (NORVASC) 10 MG tablet Take 1 tablet (10 mg total) by mouth daily before breakfast. 07/13/13   [name] Done, FNP   aspirin 325 MG tablet Take 325 mg by mouth every morning. Take 1/2 tab qam    Historical Provider, MD  atenolol (TENORMIN) 100 MG tablet Take 1 tablet (100 mg total) by mouth daily. 07/13/13   [name] Done, FNP  cloNIDine (CATAPRES) 0.1 MG tablet Take 1 tablet (0.1 mg total) by mouth 2 (two) times daily. 07/13/13   [name] Done, FNP  Fe Cbn-Fe Gluc-FA-B12-C-DSS (FERRALET 90) 90-1 MG TABS Take 1 tablet by mouth daily. 07/13/13   [name] Done, FNP  FERREX 150 150 MG capsule TAKE ONE CAPSULE BY MOUTH TWICE A DAY 07/18/13   [name] Done, FNP  glipiZIDE (GLUCOTROL) 10 MG tablet 1 1/2 po qd  07/13/13   [name] Done, FNP  naproxen sodium (ANAPROX) 220 MG tablet Take 220 mg by mouth 2 (two) times daily as needed.    Historical Provider, MD  pantoprazole (PROTONIX) 20 MG tablet Take 1 tablet (20 mg total) by mouth daily. 07/13/13   [name] Done, FNP  silver sulfADIAZINE (SILVADENE) 1 % cream APPLY TO AFFECTED AREA TWICE A DAY 06/05/13   [name] Done, FNP  simvastatin (ZOCOR) 40 MG tablet Take 1 tablet (40 mg total) by mouth at bedtime. 07/13/13   [name] Done, FNP  valsartan (DIOVAN) 320 MG tablet Take 1 tablet (320 mg total) by mouth daily. 07/13/13   [name] Done, FNP   Physical Exam: Filed Vitals:   08/01/13 1554  BP: 163/69  Pulse: 86  Temp: 98.6 F (37 C)  Resp: 18    BP 163/69  Pulse 86  Temp(Src) 98.6 F (37 C) (Oral)  Resp 18  SpO2 100%  General:  Appears calm and comfortable Eyes: PERRL, normal lids, irises & conjunctiva ENT: grossly normal hearing, lips & tongue Neck: no LAD, masses or thyromegaly Cardiovascular: RRR, + murmur, no gallop no rub. Compression stocking to left lower extremity with trace to 1+ edema. Right lower extremity low the knee amputee  Respiratory:  Normal respiratory effort. Breath sounds with good air flow very fine crackles noted on right base otherwise clear without wheeze or rhonchi Abdomen:  soft, ntnd positive bowel sounds throughout no guarding Skin: no rash or induration seen on limited exam Musculoskeletal: grossly normal tone BUE/BLE. Fair muscle tone. Right below the knee amputee. Joints without swelling or erythema Psychiatric: grossly normal mood and affect, speech fluent and appropriate Neurologic: grossly non-focal. Speech clear facial symmetry           Labs on Admission:  Basic Metabolic Panel: No results found for this basename: NA, K, CL, CO2, GLUCOSE, BUN, CREATININE, CALCIUM, MG, PHOS,  in the last 168 hours Liver Function Tests: No results found for this basename: AST, ALT, ALKPHOS, BILITOT, PROT, ALBUMIN,  in the last 168 hours No results found for this basename: LIPASE, AMYLASE,  in the last 168 hours No results found for this basename: AMMONIA,  in the last 168 hours CBC:  Recent Labs Lab 07/26/13 0910 08/01/13 1127  HGB 7.7* 7.3*   Cardiac Enzymes: No results found for this basename: CKTOTAL, CKMB, CKMBINDEX, TROPONINI,  in the last 168 hours  BNP (last 3 results) No results found for this basename: PROBNP,  in the last 8760 hours CBG: No results found for this basename: GLUCAP,  in the last 168 hours  Radiological Exams on Admission: No results found.  EKG:   Assessment/Plan Principal Problem:   Acute blood loss anemia: In patient with a history of AVMs of the colon taking aspirin and NSAIDs and melena before iron supplements started. Concern for GI bleed . EGD and colonoscopy 2 years ago with reported history of "bleeding". Will admit. Will type and screen. Will transfuse one unit of packed RBCs. Will check CBC in the a.m. Active Problems:  Possible GI bleed: Will hold aspirin and NSAIDs. Will provide protonic IV. Will request GI consult. Patient with history of AVM of the colon and internal hemorrhoids. Of note chart review indicates worsening BUN and creatinine over the last 2 weeks. Will n.p.o. until evaluated by GI. On its her  closely    DIABETES MELLITUS: Patient on oral agents at home. Will hold these for now. Will get a hemoglobin A1c. Will check CBGs and  provide sliding scale insulin for optimal glycemic control  CAD (coronary artery disease): No chest pain. Some palpitations reported intermittently with activity. Will obtain EKG. Will continue home meds.   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 Chart review indicates his creatinine a year ago was between 1.2 and 1.3. 7 months ago there was a bump in his creatinine in the range is 1.3-2.1 which was 2 weeks ago. At this time his BUN went from 13-28 as well. Will hold any nephrotoxins. Monitor his intake and output. Suspect a bump in his creatinine and BUN related to possible GI bleed.  HYPERTENSION: Blood pressure on admission 163/69. I medications include amlodipine, clonidine, atenolol, Lasix, valsartan. Will hold the Lasix and the valsartan. Monitor closely.      HYPERLIPIDEMIA: Obtain lipid panel continue his home medication    TOBACCO USER: Counseled regarding cessation.      AVM (arteriovenous malformation) of colon with hemorrhage: Colonoscopy 2 years ago. See above    GERD (gastroesophageal reflux disease): PPI     Anxiety: Appears stable at baseline.    CHF (congestive heart failure): Chart review indicates episode 3 years ago. Able to find echo and chart. Appears compensated. Will monitor daily weights and intake and output  Gastroenterology   Code Status: full Family Communication: wife at bedside Disposition Plan: home when ready  Time spent: 4 minutes  [facility]

## 2013-08-01 NOTE — Progress Notes (Signed)
Nutrition Brief Note  Patient identified on the Malnutrition Screening Tool (MST) Report  Wt Readings from Last 15 Encounters:  07/26/13 184 lb (83.462 kg)  07/15/13 180 lb (81.647 kg)  07/13/13 187 lb (84.823 kg)  03/07/13 185 lb (83.915 kg)  12/06/12 186 lb (84.369 kg)  09/19/12 180 lb (81.647 kg)  05/25/12 183 lb 12.8 oz (83.371 kg)  02/22/12 181 lb (82.101 kg)  02/09/12 182 lb 1.6 oz (82.6 kg)  02/09/12 182 lb 1.6 oz (82.6 kg)  02/03/12 182 lb 1.6 oz (82.6 kg)  01/22/12 191 lb 14.4 oz (87.045 kg)  01/22/12 191 lb 14.4 oz (87.045 kg)  01/22/12 191 lb 14.4 oz (87.045 kg)  01/19/12 187 lb 9.8 oz (85.1 kg)   Good appetite PTA. Weight changes likely due to diuretic use.   Current BMI is 26.97. Patient meets criteria for overweight based on current BMI.   Current diet order is NPO, patient is consuming approximately n/a% of meals at this time. Labs and medications reviewed.   No nutrition interventions warranted at this time. If nutrition issues arise, please consult RD.   Keyle Doby A. Jimmye Norman, RD, LDN Pager: [phone]

## 2013-08-02 ENCOUNTER — Encounter (HOSPITAL_COMMUNITY): Payer: Self-pay | Admitting: Pharmacy Technician

## 2013-08-02 ENCOUNTER — Telehealth: Payer: Self-pay | Admitting: Gastroenterology

## 2013-08-02 ENCOUNTER — Other Ambulatory Visit: Payer: Self-pay | Admitting: Gastroenterology

## 2013-08-02 ENCOUNTER — Other Ambulatory Visit: Payer: Medicaid Other

## 2013-08-02 DIAGNOSIS — D500 Iron deficiency anemia secondary to blood loss (chronic): Secondary | ICD-10-CM

## 2013-08-02 DIAGNOSIS — D649 Anemia, unspecified: Secondary | ICD-10-CM

## 2013-08-02 DIAGNOSIS — I10 Essential (primary) hypertension: Secondary | ICD-10-CM

## 2013-08-02 DIAGNOSIS — Z8719 Personal history of other diseases of the digestive system: Secondary | ICD-10-CM

## 2013-08-02 DIAGNOSIS — E119 Type 2 diabetes mellitus without complications: Secondary | ICD-10-CM

## 2013-08-02 LAB — CBC
HEMATOCRIT: 27.5 % — AB (ref 39.0–52.0)
Hemoglobin: 8.5 g/dL — ABNORMAL LOW (ref 13.0–17.0)
MCH: 27.8 pg (ref 26.0–34.0)
MCHC: 30.9 g/dL (ref 30.0–36.0)
MCV: 89.9 fL (ref 78.0–100.0)
Platelets: 270 10*3/uL (ref 150–400)
RBC: 3.06 MIL/uL — AB (ref 4.22–5.81)
RDW: 15.5 % (ref 11.5–15.5)
WBC: 6.5 10*3/uL (ref 4.0–10.5)

## 2013-08-02 LAB — GLUCOSE, CAPILLARY
GLUCOSE-CAPILLARY: 114 mg/dL — AB (ref 70–99)
Glucose-Capillary: 175 mg/dL — ABNORMAL HIGH (ref 70–99)

## 2013-08-02 LAB — HEMOGLOBIN A1C
HEMOGLOBIN A1C: 5.8 % — AB (ref <5.7)
MEAN PLASMA GLUCOSE: 120 mg/dL — AB (ref <117)

## 2013-08-02 LAB — BASIC METABOLIC PANEL
BUN: 23 mg/dL (ref 6–23)
CHLORIDE: 101 meq/L (ref 96–112)
CO2: 28 meq/L (ref 19–32)
Calcium: 8.6 mg/dL (ref 8.4–10.5)
Creatinine, Ser: 2.25 mg/dL — ABNORMAL HIGH (ref 0.50–1.35)
GFR calc Af Amer: 34 mL/min — ABNORMAL LOW (ref >90)
GFR calc non Af Amer: 29 mL/min — ABNORMAL LOW (ref >90)
Glucose, Bld: 119 mg/dL — ABNORMAL HIGH (ref 70–99)
POTASSIUM: 5 meq/L (ref 3.7–5.3)
Sodium: 140 mEq/L (ref 137–147)

## 2013-08-02 LAB — FERRITIN: Ferritin: 13 ng/mL — ABNORMAL LOW (ref 22–322)

## 2013-08-02 LAB — FOLATE: Folate: >20 ng/mL

## 2013-08-02 LAB — PRO B NATRIURETIC PEPTIDE: PRO B NATRI PEPTIDE: 2530 pg/mL — AB (ref 0–125)

## 2013-08-02 LAB — VITAMIN B12: VITAMIN B 12: 837 pg/mL (ref 211–911)

## 2013-08-02 MED ORDER — LINACLOTIDE 145 MCG PO CAPS: 145.0000 ug | ORAL_CAPSULE | Freq: Every day | ORAL | Status: DC

## 2013-08-02 MED ORDER — FUROSEMIDE 40 MG PO TABS
40.0000 mg | ORAL_TABLET | Freq: Once | ORAL | Status: AC
  Administered 2013-08-02: 40 mg via ORAL
  Filled 2013-08-02: qty 1

## 2013-08-02 MED ORDER — ASPIRIN EC 81 MG PO TBEC: 81.0000 mg | DELAYED_RELEASE_TABLET | Freq: Every day | ORAL | Status: DC

## 2013-08-02 NOTE — Telephone Encounter (Signed)
Patient is scheduled w/SLF on Tuesday Feb 24th due to transportation, I have mailed him the instructions and he is aware

## 2013-08-02 NOTE — Progress Notes (Signed)
Subjective:  Patient reports stools black after starting iron few weeks ago. Denies abdominal pain, heartburn, vomiting. Chronically constipated. Miralax not effective for him. Feels bad on fiber supplements. Patient and wife want to pursue outpatient work up due to family circumstances. They are requesting work up next week.   Objective: Vital signs in last 24 hours: Temp:  [98.3 F (36.8 C)-98.8 F (37.1 C)] 98.5 F (36.9 C) (02/12 0645) Pulse Rate:  [69-87] 69 (02/12 0645) Resp:  [16-18] 18 (02/12 0645) BP: (130-163)/(52-83) 162/83 mmHg (02/12 0645) SpO2:  [91 %-100 %] 91 % (02/12 0645) Weight:  [185 lb 11.1 oz (84.231 kg)-186 lb 4.6 oz (84.5 kg)] 186 lb 4.6 oz (84.5 kg) (02/12 0600) Last BM Date: 08/01/13 General:   Alert,  Well-developed, well-nourished, pleasant and cooperative in NAD Head:  Normocephalic and atraumatic. Eyes:  Sclera clear, no icterus.  Chest: CTA bilaterally without rales, rhonchi, crackles.    Heart:  Regular rate and rhythm; no murmurs, clicks, rubs,  or gallops. Abdomen:  Soft, nontender and nondistended. No masses, hepatosplenomegaly or hernias noted. Normal bowel sounds, without guarding, and without rebound.   Extremities:  Without clubbing, deformity or edema. Left BKA. Neurologic:  Alert and  oriented x4;  grossly normal neurologically. Skin:  Intact without significant lesions or rashes. Psych:  Alert and cooperative. Normal mood and affect.  Intake/Output from previous day: 02/11 0701 - 02/12 0700 In: 334.5 [Blood:334.5] Out: -  Intake/Output this shift:    Lab Results: CBC  Recent Labs  08/01/13 1127 08/01/13 1600 08/02/13 0546  WBC  --  5.4 6.5  HGB 7.3* 7.2* 8.5*  HCT  --  23.3* 27.5*  MCV  --  90.3 89.9  PLT  --  299 270   In 11/2012 his Hgb was 12.3.  Lab Results  Component Value Date   IRON 21* 08/01/2013   TIBC 323 08/01/2013   FERRITIN 13* 08/01/2013   Lab Results  Component Value Date   FOLATE >20.0 08/01/2013   Lab  Results  Component Value Date   VITAMINB12 837 08/01/2013   BNP: 2457  BMET  Recent Labs  08/01/13 1600 08/02/13 0546  NA 136* 140  K 4.9 5.0  CL 98 101  CO2 26 28  GLUCOSE 148* 119*  BUN 27* 23  CREATININE 2.26* 2.25*  CALCIUM 8.7 8.6   LFTs No results found for this basename: BILITOT, BILIDIR, IBILI, ALKPHOS, AST, ALT, PROT, ALBUMIN,  in the last 72 hours No results found for this basename: LIPASE,  in the last 72 hours PT/INR No results found for this basename: LABPROT, INR,  in the last 72 hours    Imaging Studies: Dg Chest 1 View  08/01/2013   CLINICAL DATA:  Shortness of breath.  EXAM: CHEST - 1 VIEW  COMPARISON:  July 15, 2013.  FINDINGS: Stable cardiomediastinal silhouette. Bilateral perihilar opacities noted on prior exam have significantly improved. Mild residual passive these remain bilaterally. No pneumothorax or pleural effusion is noted. Bony thorax is intact.  IMPRESSION: Bilateral perihilar opacities noted on prior exam have nearly resolved.   Electronically Signed   By: Sabino Dick M.D.   On: 08/01/2013 16:58     Assessment: 63 y/o male with h/o chronic GI bleeding requiring multiple blood transfusions who presents with decline in his H/H. Patient was found to have significant change in H/H last month. From 11/2012 to 06/2013 his Hgb went from 12.3 to 8.1. Started on iron supplements about four weeks ago. Stools  turned black after this. Patient denies NSAID use, specifically Naproxen although it is on his med list.  Takes ASA daily, recently dropped from 325mg  to 81mg  daily. H/O gastric and colon AVMs treated previously. Last EGD/TCS in 01/2012. Anemia profile c/w IDA. Received one unit of prbcs yesterday.  We offered EGD +/- small bowel capsule placement today. Patient wants outpatient work up next week. I discussed with Dr. Gala Romney and Dyanne Carrel, NP. We will plan on work-up next week. Patient to be discharged today.  Plan: 1. PPI daily. 2. No NSAIDS. 3. Can  continue low-dose ASA. 4. If overt GI bleeding noted or worsening SOB, present back to ER. 5. EGD +/- SB capsule placement next week.  6. Hold iron until after procedures.  7. Start Linzess 166mcg daily for chronic constipation. RX done.    LOS: 1 day   Neil Crouch  08/02/2013, 8:08 AM

## 2013-08-02 NOTE — Plan of Care (Signed)
Problem: Discharge Progression Outcomes Goal: Complications resolved/controlled Outcome: Completed/Met Date Met:  08/02/13 Hgb 8.5 Goal: Activity appropriate for discharge plan Outcome: Completed/Met Date Met:  08/02/13 Ambulating in halls

## 2013-08-02 NOTE — Telephone Encounter (Signed)
Please schedule patient for EGD +/- SB capsule endoscopy with Dr. Oneida Alar for next week.  Patient needs early AM procedure time.

## 2013-08-02 NOTE — Discharge Summary (Signed)
Physician Discharge Summary  [name] PPI:[id number] DOB: 1950-09-11 DOA: 08/01/2013  PCP: Redge Gainer, MD  Admit date: 08/01/2013 Discharge date: 08/02/2013  Time spent: 40 minutes  Recommendations for Outpatient Follow-up:  1. Follow up with PCP 1 week. Recommend CBC and BMET to track Hg and renal function. Recommend evaluation of BP as medications adjusted due to possible worsening renal function.  2. Consider outpatient nephrology referral for CKD stage 3-4 3. Dr. Oneida Alar office will contact patient to schedule outpatient EGD and possible capsule study  Discharge Diagnoses:    Acute blood loss anemia GI bleed   DIABETES MELLITUS   HYPERLIPIDEMIA   TOBACCO USER   HYPERTENSION   CAD (coronary artery disease)   CKD (chronic kidney disease)   AVM (arteriovenous malformation) of colon with hemorrhage   GERD (gastroesophageal reflux disease)   Anxiety   CHF (congestive heart failure)   Discharge Condition: stable  Diet recommendation: carb modified heart healthy  Filed Weights   08/01/13 2300 08/02/13 0600  Weight: 84.231 kg (185 lb 11.1 oz) 84.5 kg (186 lb 4.6 oz)    History of present illness:  [name] is a very pleasant 63 y.o. male with a past medical history that includes CAD, hypertension, diabetes, AVM of:, Right kidney disease, anemia, tobacco use, CHF presented to room 332 from home with the chief complaint of abnormal labs. They report that patient has had a low hemoglobin for the previous 4 weeks. He reported that he'd been going to his primary care provider on a weekly basis to have his hemoglobin checked and it has been between 7.5 and 8.1 he had been prescribed iron supplements during this time. On day of admission his hemoglobin was noted to be 7.3. In addition about a week ago he experienced some shortness of breath and was seen at his primary care physician's office. He was instructed to take his Lasix daily prior to that he has been taking Lasix  on an as-needed basis. He reported that his breathing had improved since he'd been taking the Lasix. Associated symptoms include some generalized weakness and early fatigue with occasional heart palpitations. He denied chest pain abdominal pain nausea vomiting constipation diarrhea. He did endorse some melena but denied hematochezia. He reported he had a colonoscopy 2 years ago and was told at that time "I had bleeding in my stomach and my intestines. 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 In addition he takes aspirin daily. On admission his vital signs were stable he was afebrile and not hypoxic.   Hospital Course:  Acute blood loss anemia: In patient with a history of AVMs of the colon taking aspirin and NSAIDs and melena which may have occurred before iron supplements started. Anemia profile consistent with IDA. Concern for GI bleed . EGD and colonoscopy 2 years ago with reported history of "bleeding". Transfused 1 unit packed RBC's. Hg 8.5 at discharge. Recommend PCP follow up 1 week for CBC to track HG.  Active Problems:  Possible GI bleed: Aspirin and NSAIDs held. Wife reported pt not taking NSAIDs recently. Given protonix IV. Patient with history of AVM of the colon and internal hemorrhoids. Evaluated by GI who opine that EGD and possible capsule study warranted but could be scheduled outpatient next week. No s/sx active bleeding. Will discharge with PPI, hold iron, low dose asa and no nsaids per GI.    DIABETES MELLITUS: Patient on oral agents at home. A1c 5.8. Resume home regime.  CAD (coronary artery disease): No chest pain. Some palpitations reported intermittently with activity.  EKG with NSR with occasional PVC. No events on tele.   CKD (chronic kidney disease) stage III-IV. Will stop diovan. Recommend BMET for evaluation of renal funciton   HYPERTENSION: Blood pressure on admission 163/69. Home medications include amlodipine, clonidine,  atenolol, Lasix, valsartan. Lasix and the valsartan valsartan held on admission. Will resume lasix at discharge. Recommend close OP follow up for optimal BP control.    HYPERLIPIDEMIA:  Triglycerides 293, HDL 30 VLDL 59.  TOBACCO USER: Counseled regarding cessation.   AVM (arteriovenous malformation) of colon with hemorrhage: Colonoscopy 2 years ago. See above   GERD (gastroesophageal reflux disease): PPI   Anxiety: remained stable at baseline.   CHF (congestive heart failure): Chart review indicates episode 3 years ago. Able to find echo and chart. Remained compensated.     Procedures:    Consultations:  GI  Discharge Exam: Filed Vitals:   08/02/13 0645  BP: 162/83  Pulse: 69  Temp: 98.5 F (36.9 C)  Resp: 18    General: well nourished NAD Cardiovascular: RRR +murmur. No gallup or rub. Compression stocking to left leg. Trace LE edema right BKA Respiratory: normal effort BS clear bilaterally no wheeze  Discharge Instructions       Future Appointments Provider Department Dept Phone   09/27/2013 9:00 AM [facility] [address] [phone]   09/27/2013 10:00 AM [facility] [place] [address] [phone]   09/27/2013 10:30 AM [facility] [address] [phone]   09/27/2013 11:40 AM Sharmon Leyden Nickel, NP [facility] [phone]       Medication List    STOP taking these medications       aspirin 325 MG tablet  Replaced by:  aspirin EC 81 MG tablet     iron polysaccharides 150 MG capsule  Commonly known as:  NIFEREX     naproxen sodium 220 MG tablet  Commonly known as:  ANAPROX     valsartan 320 MG tablet  Commonly known as:  DIOVAN      TAKE these medications       amLODipine 10 MG tablet  Commonly known as:  NORVASC  Take 1 tablet (10 mg total) by mouth daily before breakfast.     aspirin EC 81 MG tablet  Take 1 tablet (81 mg total) by mouth  daily.     atenolol 50 MG tablet  Commonly known as:  TENORMIN  Take 50 mg by mouth 2 (two) times daily.     cloNIDine 0.1 MG tablet  Commonly known as:  CATAPRES  Take 1 tablet (0.1 mg total) by mouth 2 (two) times daily.     furosemide 40 MG tablet  Commonly known as:  LASIX  Take 1 tablet (40 mg total) by mouth as needed.     glipiZIDE 10 MG tablet  Commonly known as:  GLUCOTROL  Take 15 mg by mouth daily.     Linaclotide 145 MCG Caps capsule  Commonly known as:  LINZESS  Take 1 capsule (145 mcg total) by mouth daily.     pantoprazole 20 MG tablet  Commonly known as:  PROTONIX  Take 1 tablet (20 mg total) by mouth daily.     silver sulfADIAZINE 1 % cream  Commonly known as:  SILVADENE  Apply 1 application topically 2 (two) times daily.     simvastatin 40 MG tablet  Commonly known as:  ZOCOR  Take 1 tablet (40 mg total) by mouth at bedtime.       Allergies  Allergen Reactions  . Zocor [Simvastatin] Palpitations    Chest soreness  . Hctz [Hydrochlorothiazide]     Leg cramps  . Iodine Hives    IVP dye  . Sulfa Antibiotics Other (See Comments)    Unknown    Follow-up Information   Follow up with Redge Gainer, MD. Schedule an appointment as soon as possible for a visit in 1 week. (for evaluation of symptoms, recommend CBC and BMET to follow hg and renal function)    Specialty:  Family Medicine   Contact information:   [address] [place] [address] [phone]       Follow up with Barney Drain, MD. (office will contact you to schedule outpatient EGD and capsule study)    Specialty:  Gastroenterology   Contact information:   [place] [address] [place] [address] [phone]        The results of significant diagnostics from this hospitalization (including imaging, microbiology, ancillary and laboratory) are listed below for reference.    Significant Diagnostic Studies: Dg Chest 1 View  08/01/2013   CLINICAL  DATA:  Shortness of breath.  EXAM: CHEST - 1 VIEW  COMPARISON:  July 15, 2013.  FINDINGS: Stable cardiomediastinal silhouette. Bilateral perihilar opacities noted on prior exam have significantly improved. Mild residual passive these remain bilaterally. No pneumothorax or pleural effusion is noted. Bony thorax is intact.  IMPRESSION: Bilateral perihilar opacities noted on prior exam have nearly resolved.   Electronically Signed   By: Sabino Dick M.D.   On: 08/01/2013 16:58   Dg Chest 2 View  07/15/2013   CLINICAL DATA:  Shortness of breath and palpitations.  EXAM: CHEST  2 VIEW  COMPARISON:  Chest radiograph from 01/19/2012  FINDINGS: The lungs are well-aerated. Bilateral perihilar airspace opacification raises concern for multifocal pneumonia, possibly atypical in nature. The appearance is somewhat less typical for interstitial edema. No pleural effusion or pneumothorax is seen. There is no evidence of pleural effusion or pneumothorax.  The heart is normal in size; the mediastinal contour is within normal limits. No acute osseous abnormalities are seen.  IMPRESSION: Bilateral perihilar airspace opacification raises concern for multifocal pneumonia, possibly atypical in nature. The appearance is somewhat less typical for interstitial edema, though it remains a possibility.   Electronically Signed   By: Garald Balding M.D.   On: 07/15/2013 04:41    Microbiology: No results found for this or any previous visit (from the past 240 hour(s)).   Labs: Basic Metabolic Panel:  Recent Labs Lab 08/01/13 1600 08/02/13 0546  NA 136* 140  K 4.9 5.0  CL 98 101  CO2 26 28  GLUCOSE 148* 119*  BUN 27* 23  CREATININE 2.26* 2.25*  CALCIUM 8.7 8.6   Liver Function Tests: No results found for this basename: AST, ALT, ALKPHOS, BILITOT, PROT, ALBUMIN,  in the last 168 hours No results found for this basename: LIPASE, AMYLASE,  in the last 168 hours No results found for this basename: AMMONIA,  in the last  168 hours CBC:  Recent Labs Lab 08/01/13 1127 08/01/13 1600 08/02/13 0546  WBC  --  5.4 6.5  HGB 7.3* 7.2* 8.5*  HCT  --  23.3* 27.5*  MCV  --  90.3 89.9  PLT  --  299 270   Cardiac Enzymes: No results found  for this basename: CKTOTAL, CKMB, CKMBINDEX, TROPONINI,  in the last 168 hours BNP: BNP (last 3 results)  Recent Labs  08/01/13 1600 08/02/13 0546  PROBNP 2457.0* 2530.0*   CBG:  Recent Labs Lab 08/01/13 1755 08/01/13 2138 08/02/13 0758  GLUCAP 135* 185* 114*       Signed:  BLACK,KAREN M  [facility] 08/02/2013, 10:19 AM  Attending note:  Patient seen and examined.  Agree with note as above.  Patient was transfused 1 unit of prbc overnight for symptomatic anemia.  He is feeling improved today and hemoglobin has responded appropriately.  Anemia panel indicates iron deficiency, likely from chronic blood loss. He does describe dark stools.  GI has seen the patient and has offered inpatient evaluation, but patient has opted to pursue further work up including endoscopy as outpatient. He has been cleared for discharge by GI.  He will continue ppi as outpatient and has been advised to avoid NSAIDS.  He has been advised not to resume iron supplements until after procedure.  Recommendations also included to continue on aspirin 81mg  daily.  The patient does have chronic kidney disease and renal function appears stable over the last several weeks.  We will discontinue ARB in the setting of rising creatinine.  Recommend outpatient nephrology referral for CKD 3-4.  Recheck CBC and BMET when follow up with PCP in 1 week  Shafin Pollio

## 2013-08-02 NOTE — Progress Notes (Signed)
Patient requested that he get his lasix because he stated that he takes it at home daily and feels badly when he misses a dose. Patient is not in distress at this time nor does patient have any edema, wheezing in the lungs, and O2 sat is 100% on room air. Patient is alert and walking the unit hallways at this time. MD was made aware of the patients request. MD ordered 40mg  of Lasix as a one time dose. Will continue to monitor patient.

## 2013-08-05 LAB — TYPE AND SCREEN
ABO/RH(D): O POS
Antibody Screen: NEGATIVE
Unit division: 0
Unit division: 0

## 2013-08-09 ENCOUNTER — Encounter: Payer: Self-pay | Admitting: Nurse Practitioner

## 2013-08-09 ENCOUNTER — Ambulatory Visit (INDEPENDENT_AMBULATORY_CARE_PROVIDER_SITE_OTHER): Payer: Medicaid Other | Admitting: Nurse Practitioner

## 2013-08-09 VITALS — BP 157/78 | HR 86 | Temp 98.6°F | Ht 64.0 in | Wt 183.0 lb

## 2013-08-09 DIAGNOSIS — N189 Chronic kidney disease, unspecified: Secondary | ICD-10-CM

## 2013-08-09 DIAGNOSIS — I10 Essential (primary) hypertension: Secondary | ICD-10-CM

## 2013-08-09 DIAGNOSIS — D649 Anemia, unspecified: Secondary | ICD-10-CM

## 2013-08-09 LAB — POCT HEMOGLOBIN: Hemoglobin: 8.5 g/dL — AB (ref 14.1–18.1)

## 2013-08-09 NOTE — Progress Notes (Signed)
   Subjective:    Patient ID: [name], male    DOB: 1951/02/05, 63 y.o.   MRN: [id number]  HPI  Patient was sent to hospital with a hgb of 7.3. He stayed over night and got one unit of blood. They are going to do endoscopy on February 24,2015 to see where he is losing blood from. He is doing well and says he feels fine. They stopped hid diovan in the hospital due to kidney function but did not put him on anything to take it's place.  8 patient has not been taking clonidine as rx because someone tol dhim it caused hallucinatyions which he never had while  Taking.  Review of Systems  Constitutional: Positive for fatigue.  HENT: Negative.   Eyes: Negative.   Respiratory: Negative.   All other systems reviewed and are negative.       Objective:   Physical Exam  Constitutional: He is oriented to person, place, and time. He appears well-developed and well-nourished.  Cardiovascular: Normal rate, regular rhythm and normal heart sounds.   Pulmonary/Chest: Effort normal and breath sounds normal.  Neurological: He is alert and oriented to person, place, and time.  Skin: Skin is warm and dry.  Psychiatric: He has a normal mood and affect. His behavior is normal. Judgment and thought content normal.  BP 157/78  Pulse 86  Temp(Src) 98.6 F (37 C) (Oral)  Ht $R'5\' 4"'qA$  (1.626 m)  Wt 183 lb (83.008 kg)  BMI 31.40 kg/m2  Results for orders placed in visit on 08/09/13  POCT HEMOGLOBIN      Result Value Ref Range   Hemoglobin 8.5 (*) 14.1 - 18.1 g/dL          Assessment & Plan:   1. Anemia   2. CKD (chronic kidney disease)   3. Hypertension    Orders Placed This Encounter  Procedures  . CMP14+EGFR  . POCT hemoglobin  continue iron tablets as rx Patient told to go back on clonidine BID and keep diary of blood pressure Labs pending Continue all meds Follow up  In 2weeks   [place], FNP

## 2013-08-09 NOTE — Patient Instructions (Signed)

## 2013-08-10 LAB — CMP14+EGFR
A/G RATIO: 1.5 (ref 1.1–2.5)
ALT: 12 IU/L (ref 0–44)
AST: 16 IU/L (ref 0–40)
Albumin: 3.4 g/dL — ABNORMAL LOW (ref 3.6–4.8)
Alkaline Phosphatase: 80 IU/L (ref 39–117)
BUN/Creatinine Ratio: 10 (ref 10–22)
BUN: 22 mg/dL (ref 8–27)
CALCIUM: 8.7 mg/dL (ref 8.6–10.2)
CO2: 25 mmol/L (ref 18–29)
CREATININE: 2.13 mg/dL — AB (ref 0.76–1.27)
Chloride: 99 mmol/L (ref 97–108)
GFR calc Af Amer: 37 mL/min/{1.73_m2} — ABNORMAL LOW (ref >59)
GFR, EST NON AFRICAN AMERICAN: 32 mL/min/{1.73_m2} — AB (ref >59)
Globulin, Total: 2.3 g/dL (ref 1.5–4.5)
Glucose: 188 mg/dL — ABNORMAL HIGH (ref 65–99)
Potassium: 4.8 mmol/L (ref 3.5–5.2)
Sodium: 137 mmol/L (ref 134–144)
TOTAL PROTEIN: 5.7 g/dL — AB (ref 6.0–8.5)
Total Bilirubin: <0.2 mg/dL (ref 0.0–1.2)

## 2013-08-14 ENCOUNTER — Telehealth: Payer: Self-pay | Admitting: Gastroenterology

## 2013-08-14 NOTE — Telephone Encounter (Signed)
I have gotten [name] R/S and he is aware

## 2013-08-14 NOTE — Telephone Encounter (Signed)
Pt called to cancel procedure with [facility] today due to weather. Please call [phone] to [facility] (I [facility] for [name] that patient wasn't coming today)

## 2013-08-20 ENCOUNTER — Telehealth: Payer: Self-pay | Admitting: Nurse Practitioner

## 2013-08-20 NOTE — Telephone Encounter (Signed)
appt for 3/5 given

## 2013-08-23 ENCOUNTER — Ambulatory Visit (INDEPENDENT_AMBULATORY_CARE_PROVIDER_SITE_OTHER): Payer: Medicaid Other | Admitting: Nurse Practitioner

## 2013-08-23 ENCOUNTER — Telehealth: Payer: Self-pay | Admitting: Nurse Practitioner

## 2013-08-23 ENCOUNTER — Encounter: Payer: Self-pay | Admitting: Nurse Practitioner

## 2013-08-23 VITALS — BP 129/64 | HR 87 | Temp 97.3°F | Ht 64.0 in | Wt 186.0 lb

## 2013-08-23 DIAGNOSIS — D649 Anemia, unspecified: Secondary | ICD-10-CM

## 2013-08-23 LAB — POCT HEMOGLOBIN: Hemoglobin: 7.5 g/dL — AB (ref 14.1–18.1)

## 2013-08-23 NOTE — Progress Notes (Signed)
   Subjective:    Patient ID: [name], male    DOB: [birth year], 63 y.o.   MRN: [id number]  HPI Patient presents today for follow up of hgb and BP. Was 8.5 on August 09, 2013. Patient states he feels fine. Endoscopy was scheduled for August 14, 2013 but was cancelled due to snow. Is now scheduled for September 03, 2013. Patient's BP is controlled today. Has been taking clonidine but has not been monitoring BP at home. Denies fatigue, malaise, or blood in urine. Is unable to assess blood in stool due to dark stool color from taking iron.  Review of Systems  Constitutional: Negative for activity change and fatigue.  Respiratory: Negative for shortness of breath.   Cardiovascular: Negative for chest pain.  Genitourinary: Negative for hematuria.  Neurological: Negative for weakness.       Objective:   Physical Exam  Constitutional: He is oriented to person, place, and time. He appears well-developed and well-nourished.  HENT:  Head: Normocephalic.  Cardiovascular: Normal rate, regular rhythm and normal heart sounds.   Pulmonary/Chest: Effort normal and breath sounds normal.  Neurological: He is alert and oriented to person, place, and time.  Skin: Skin is warm and dry.  Psychiatric: He has a normal mood and affect. His behavior is normal. Judgment and thought content normal.         BP 129/64  Pulse 87  Temp(Src) 97.3 F (36.3 C) (Oral)  Ht 5\' 4"  (1.626 m)  Wt 186 lb (84.369 kg)  BMI 31.91 kg/m2  Results for orders placed in visit on 08/23/13  POCT HEMOGLOBIN      Result Value Ref Range   Hemoglobin 7.5 (*) 14.1 - 18.1 g/dL    Assessment & Plan:   1. Low hemoglobin   2. Anemia    To short stay at [facility] to be typed and cross matched for 2 units of blood- orders sent to [facility].  [name] Done, FNP

## 2013-08-23 NOTE — Patient Instructions (Signed)

## 2013-08-23 NOTE — Telephone Encounter (Signed)
Wants to know what time they need to be at [facility] in the morning

## 2013-08-24 ENCOUNTER — Ambulatory Visit: Payer: Medicaid Other | Admitting: Nurse Practitioner

## 2013-08-24 ENCOUNTER — Encounter (HOSPITAL_COMMUNITY)
Admission: RE | Admit: 2013-08-24 | Discharge: 2013-08-24 | Disposition: A | Discharge status: Home / Self Care | Payer: Medicaid Other | Source: Ambulatory Visit | Attending: Family Medicine | Admitting: Family Medicine

## 2013-08-24 ENCOUNTER — Encounter (HOSPITAL_COMMUNITY): Payer: Self-pay

## 2013-08-24 DIAGNOSIS — D649 Anemia, unspecified: Secondary | ICD-10-CM | POA: Insufficient documentation

## 2013-08-24 LAB — PREPARE RBC (CROSSMATCH)

## 2013-08-24 LAB — HEMOGLOBIN AND HEMATOCRIT, BLOOD
HCT: 24.4 % — ABNORMAL LOW (ref 39.0–52.0)
Hemoglobin: 7.6 g/dL — ABNORMAL LOW (ref 13.0–17.0)

## 2013-08-24 MED ORDER — SODIUM CHLORIDE 0.9 % IV SOLN
Freq: Once | INTRAVENOUS | Status: AC
  Administered 2013-08-24: 500 mL via INTRAVENOUS

## 2013-08-24 NOTE — Progress Notes (Signed)
Pt given 2 units PC over 4.5 hrs. Tolerated well. No s/s of reactions.

## 2013-08-24 NOTE — Progress Notes (Signed)
Results for IHAN, PAT (MRN [id number]) as of 08/24/2013 12:23  H&H prior to 2 units PC   Ref. Range 08/24/2013 08:17  Hemoglobin Latest Range: 13.0-17.0 g/dL 7.6 (L)  HCT Latest Range: 39.0-52.0 % 24.4 (L)

## 2013-08-25 LAB — TYPE AND SCREEN
ABO/RH(D): O POS
ANTIBODY SCREEN: NEGATIVE
UNIT DIVISION: 0
Unit division: 0

## 2013-08-30 ENCOUNTER — Other Ambulatory Visit (INDEPENDENT_AMBULATORY_CARE_PROVIDER_SITE_OTHER): Payer: Medicaid Other

## 2013-08-30 DIAGNOSIS — R5381 Other malaise: Secondary | ICD-10-CM

## 2013-08-30 DIAGNOSIS — R5383 Other fatigue: Principal | ICD-10-CM

## 2013-08-30 LAB — POCT HEMOGLOBIN: Hemoglobin: 9.2 g/dL — AB (ref 14.1–18.1)

## 2013-08-30 NOTE — Progress Notes (Signed)
Pt came in for labs only 

## 2013-09-03 ENCOUNTER — Ambulatory Visit (HOSPITAL_COMMUNITY)
Admission: RE | Admit: 2013-09-03 | Discharge: 2013-09-03 | Disposition: A | Discharge status: Home Health | Payer: Medicaid Other | Source: Ambulatory Visit | Attending: Gastroenterology | Admitting: Gastroenterology

## 2013-09-03 ENCOUNTER — Encounter (HOSPITAL_COMMUNITY)
Admission: RE | Disposition: A | Discharge status: Home Health | Payer: Self-pay | Source: Ambulatory Visit | Attending: Gastroenterology

## 2013-09-03 ENCOUNTER — Encounter (HOSPITAL_COMMUNITY): Payer: Self-pay | Admitting: *Deleted

## 2013-09-03 DIAGNOSIS — D649 Anemia, unspecified: Secondary | ICD-10-CM

## 2013-09-03 DIAGNOSIS — I6529 Occlusion and stenosis of unspecified carotid artery: Secondary | ICD-10-CM | POA: Insufficient documentation

## 2013-09-03 DIAGNOSIS — I509 Heart failure, unspecified: Secondary | ICD-10-CM | POA: Insufficient documentation

## 2013-09-03 DIAGNOSIS — I129 Hypertensive chronic kidney disease with stage 1 through stage 4 chronic kidney disease, or unspecified chronic kidney disease: Secondary | ICD-10-CM | POA: Insufficient documentation

## 2013-09-03 DIAGNOSIS — K2990 Gastroduodenitis, unspecified, without bleeding: Secondary | ICD-10-CM

## 2013-09-03 DIAGNOSIS — K5730 Diverticulosis of large intestine without perforation or abscess without bleeding: Secondary | ICD-10-CM | POA: Insufficient documentation

## 2013-09-03 DIAGNOSIS — I739 Peripheral vascular disease, unspecified: Secondary | ICD-10-CM | POA: Insufficient documentation

## 2013-09-03 DIAGNOSIS — E119 Type 2 diabetes mellitus without complications: Secondary | ICD-10-CM | POA: Insufficient documentation

## 2013-09-03 DIAGNOSIS — D131 Benign neoplasm of stomach: Secondary | ICD-10-CM | POA: Insufficient documentation

## 2013-09-03 DIAGNOSIS — K648 Other hemorrhoids: Secondary | ICD-10-CM | POA: Insufficient documentation

## 2013-09-03 DIAGNOSIS — K2960 Other gastritis without bleeding: Secondary | ICD-10-CM | POA: Insufficient documentation

## 2013-09-03 DIAGNOSIS — F17200 Nicotine dependence, unspecified, uncomplicated: Secondary | ICD-10-CM | POA: Insufficient documentation

## 2013-09-03 DIAGNOSIS — N189 Chronic kidney disease, unspecified: Secondary | ICD-10-CM | POA: Insufficient documentation

## 2013-09-03 DIAGNOSIS — K219 Gastro-esophageal reflux disease without esophagitis: Secondary | ICD-10-CM | POA: Insufficient documentation

## 2013-09-03 DIAGNOSIS — E785 Hyperlipidemia, unspecified: Secondary | ICD-10-CM | POA: Insufficient documentation

## 2013-09-03 DIAGNOSIS — Z8719 Personal history of other diseases of the digestive system: Secondary | ICD-10-CM

## 2013-09-03 DIAGNOSIS — M479 Spondylosis, unspecified: Secondary | ICD-10-CM | POA: Insufficient documentation

## 2013-09-03 DIAGNOSIS — R011 Cardiac murmur, unspecified: Secondary | ICD-10-CM | POA: Insufficient documentation

## 2013-09-03 DIAGNOSIS — S88119A Complete traumatic amputation at level between knee and ankle, unspecified lower leg, initial encounter: Secondary | ICD-10-CM | POA: Insufficient documentation

## 2013-09-03 DIAGNOSIS — F411 Generalized anxiety disorder: Secondary | ICD-10-CM | POA: Insufficient documentation

## 2013-09-03 DIAGNOSIS — Z7982 Long term (current) use of aspirin: Secondary | ICD-10-CM | POA: Insufficient documentation

## 2013-09-03 DIAGNOSIS — I499 Cardiac arrhythmia, unspecified: Secondary | ICD-10-CM | POA: Insufficient documentation

## 2013-09-03 DIAGNOSIS — K2970 Gastritis, unspecified, without bleeding: Secondary | ICD-10-CM

## 2013-09-03 HISTORY — PX: ESOPHAGOGASTRODUODENOSCOPY: SHX5428

## 2013-09-03 HISTORY — PX: GIVENS CAPSULE STUDY: SHX5432

## 2013-09-03 SURGERY — EGD (ESOPHAGOGASTRODUODENOSCOPY)
Anesthesia: Moderate Sedation

## 2013-09-03 MED ORDER — SODIUM CHLORIDE 0.9 % IV SOLN
INTRAVENOUS | Status: DC
  Administered 2013-09-03: 09:00:00 via INTRAVENOUS

## 2013-09-03 MED ORDER — STERILE WATER FOR IRRIGATION IR SOLN
Status: DC | PRN
  Administered 2013-09-03: 10:00:00

## 2013-09-03 MED ORDER — MIDAZOLAM HCL 5 MG/5ML IJ SOLN
INTRAMUSCULAR | Status: DC | PRN
  Administered 2013-09-03 (×2): 1 mg via INTRAVENOUS
  Administered 2013-09-03: 2 mg via INTRAVENOUS
  Administered 2013-09-03: 1 mg via INTRAVENOUS

## 2013-09-03 MED ORDER — FENTANYL CITRATE 0.05 MG/ML IJ SOLN
INTRAMUSCULAR | Status: DC | PRN
  Administered 2013-09-03 (×4): 25 ug via INTRAVENOUS

## 2013-09-03 MED ORDER — MIDAZOLAM HCL 5 MG/5ML IJ SOLN
INTRAMUSCULAR | Status: AC
  Filled 2013-09-03: qty 10

## 2013-09-03 MED ORDER — LIDOCAINE VISCOUS 2 % MT SOLN
OROMUCOSAL | Status: AC
  Filled 2013-09-03: qty 15

## 2013-09-03 MED ORDER — FENTANYL CITRATE 0.05 MG/ML IJ SOLN
INTRAMUSCULAR | Status: AC
  Filled 2013-09-03: qty 2

## 2013-09-03 MED ORDER — LIDOCAINE VISCOUS 2 % MT SOLN
OROMUCOSAL | Status: DC | PRN
  Administered 2013-09-03: 1 via OROMUCOSAL

## 2013-09-03 NOTE — Discharge Instructions (Signed)
You have gastric polyps and gastritis. I biopsied your stomach. YOU HAVE A CAPSULE IN YOUR SMALL BOWEL.   CONTINUE PROTONIX. TAKE 30 MINUTES PRIOR TO YOUR FIRST MEAL.  FOLLOW THE DIET INSTRUCTION ACCORDING TO CAPSULE INSTRUCTION.  START A LOW FAT DIET AROUND 6 PM TODAY. SEE INFO BELOW.  RETURN THE RECORDER TOMORROW.  YOUR BIOPSY WILL BE BACK IN 7 DAYS.  FOLLOW UP IN 4 MOS.    UPPER ENDOSCOPY AFTER CARE Read the instructions outlined below and refer to this sheet in the next week. These discharge instructions provide you with general information on caring for yourself after you leave the hospital. While your treatment has been planned according to the most current medical practices available, unavoidable complications occasionally occur. If you have any problems or questions after discharge, call DR. Brodric Schauer, [phone].  ACTIVITY  You may resume your regular activity, but move at a slower pace for the next 24 hours.   Take frequent rest periods for the next 24 hours.   Walking will help get rid of the air and reduce the bloated feeling in your belly (abdomen).   No driving for 24 hours (because of the medicine (anesthesia) used during the test).   You may shower.   Do not sign any important legal documents or operate any machinery for 24 hours (because of the anesthesia used during the test).    NUTRITION  Drink plenty of fluids.   You may resume your normal diet as instructed by your doctor.   Begin with a light meal and progress to your normal diet. Heavy or fried foods are harder to digest and may make you feel sick to your stomach (nauseated).   Avoid alcoholic beverages for 24 hours or as instructed.    MEDICATIONS  You may resume your normal medications.   WHAT YOU CAN EXPECT TODAY  Some feelings of bloating in the abdomen.   Passage of more gas than usual.    IF YOU HAD A BIOPSY TAKEN DURING THE UPPER ENDOSCOPY:  Eat a soft diet IF YOU HAVE NAUSEA,  BLOATING, ABDOMINAL PAIN, OR VOMITING.    FINDING OUT THE RESULTS OF YOUR TEST Not all test results are available during your visit. DR. Oneida Alar WILL CALL YOU WITHIN 7 DAYS OF YOUR PROCEDUE WITH YOUR RESULTS. Do not assume everything is normal if you have not heard from DR. Florene Brill IN ONE WEEK, CALL HER OFFICE AT [phone].  SEEK IMMEDIATE MEDICAL ATTENTION AND CALL THE OFFICE: [phone] IF:  You have more than a spotting of blood in your stool.   Your belly is swollen (abdominal distention).   You are nauseated or vomiting.   You have a temperature over 101F.   You have abdominal pain or discomfort that is severe or gets worse throughout the day.    Low-Fat Diet BREADS, CEREALS, PASTA, RICE, DRIED PEAS, AND BEANS These products are high in carbohydrates and most are low in fat. Therefore, they can be increased in the diet as substitutes for fatty foods. They too, however, contain calories and should not be eaten in excess. Cereals can be eaten for snacks as well as for breakfast.  Include foods that contain fiber (fruits, vegetables, whole grains, and legumes). Research shows that fiber may lower blood cholesterol levels, especially the water-soluble fiber found in fruits, vegetables, oat products, and legumes. FRUITS AND VEGETABLES It is good to eat fruits and vegetables. Besides being sources of fiber, both are rich in vitamins and some minerals. They help  you get the daily allowances of these nutrients. Fruits and vegetables can be used for snacks and desserts. MEATS Limit lean meat, chicken, [place], and fish to no more than 6 ounces per day. Beef, Pork, and Lamb Use lean cuts of beef, pork, and lamb. Lean cuts include:  Extra-lean ground beef.  Arm roast.  Sirloin tip.  Center-cut ham.  Round steak.  Loin chops.  Rump roast.  Tenderloin.  Trim all fat off the outside of meats before cooking. It is not necessary to severely decrease the intake of red meat, but lean  choices should be made. Lean meat is rich in protein and contains a highly absorbable form of iron. Premenopausal women, in particular, should avoid reducing lean red meat because this could increase the risk for low red blood cells (iron-deficiency anemia).  Chicken and [place] These are good sources of protein. The fat of poultry can be reduced by removing the skin and underlying fat layers before cooking. Chicken and [place] can be substituted for lean red meat in the diet. Poultry should not be fried or covered with high-fat sauces. Fish and Shellfish Fish is a good source of protein. Shellfish contain cholesterol, but they usually are low in saturated fatty acids. The preparation of fish is important. Like chicken and [place], they should not be fried or covered with high-fat sauces. EGGS Egg whites contain no fat or cholesterol. They can be eaten often. Try 1 to 2 egg whites instead of whole eggs in recipes or use egg substitutes that do not contain yolk.  MILK AND DAIRY PRODUCTS Use skim or 1% milk instead of 2% or whole milk. Decrease whole milk, natural, and processed cheeses. Use nonfat or low-fat (2%) cottage cheese or low-fat cheeses made from vegetable oils. Choose nonfat or low-fat (1 to 2%) yogurt. Experiment with evaporated skim milk in recipes that call for heavy cream. Substitute low-fat yogurt or low-fat cottage cheese for sour cream in dips and salad dressings. Have at least 2 servings of low-fat dairy products, such as 2 glasses of skim (or 1%) milk each day to help get your daily calcium intake.  FATS AND OILS Butterfat, lard, and beef fats are high in saturated fat and cholesterol. These should be avoided.Vegetable fats do not contain cholesterol. AVOID coconut oil, palm oil, and palm kernel oil, WHICH are very high in saturated fats. These should be limited. These fats are often used in bakery goods, processed foods, popcorn, oils, and nondairy creamers. Vegetable shortenings and  some peanut butters contain hydrogenated oils, which are also saturated fats. Read the labels on these foods and check for saturated vegetable oils.  Desirable liquid vegetable oils are corn oil, cottonseed oil, olive oil, canola oil, safflower oil, soybean oil, and sunflower oil. Peanut oil is not as good, but small amounts are acceptable. Buy a heart-healthy tub margarine that has no partially hydrogenated oils in the ingredients. AVOID Mayonnaise and salad dressings often are made from unsaturated fats.  OTHER EATING TIPS Snacks  Most sweets should be limited as snacks. They tend to be rich in calories and fats, and their caloric content outweighs their nutritional value. Some good choices in snacks are graham crackers, melba toast, soda crackers, bagels (no egg), English muffins, fruits, and vegetables. These snacks are preferable to snack crackers, [place] fries, and chips. Popcorn should be air-popped or cooked in small amounts of liquid vegetable oil.  Desserts Eat fruit, low-fat yogurt, and fruit ices instead of pastries, cake, and cookies. Sherbet, angel  food cake, gelatin dessert, frozen low-fat yogurt, or other frozen products that do not contain saturated fat (pure fruit juice bars, frozen ice pops) are also acceptable.   COOKING METHODS Choose those methods that use little or no fat. They include: Poaching.  Braising.  Steaming.  Grilling.  Baking.  Stir-frying.  Broiling.  Microwaving.  Foods can be cooked in a nonstick pan without added fat, or use a nonfat cooking spray in regular cookware. Limit fried foods and avoid frying in saturated fat. Add moisture to lean meats by using water, broth, cooking wines, and other nonfat or low-fat sauces along with the cooking methods mentioned above. Soups and stews should be chilled after cooking. The fat that forms on top after a few hours in the refrigerator should be skimmed off. When preparing meals, avoid using excess salt. Salt can  contribute to raising blood pressure in some people.  EATING AWAY FROM HOME Order entres, potatoes, and vegetables without sauces or butter. When meat exceeds the size of a deck of cards (3 to 4 ounces), the rest can be taken home for another meal. Choose vegetable or fruit salads and ask for low-calorie salad dressings to be served on the side. Use dressings sparingly. Limit high-fat toppings, such as bacon, crumbled eggs, cheese, sunflower seeds, and olives. Ask for heart-healthy tub margarine instead of butter.

## 2013-09-03 NOTE — Op Note (Signed)
[facility] [address] [place], [address]   ENDOSCOPY PROCEDURE REPORT  PATIENT: [name], [name]  MR#: [id number] BIRTHDATE: 01-Sep-1950 , 62  yrs. old GENDER: Male  ENDOSCOPIST: Barney Drain, MD REFERRED KN:[id number] Laurance Flatten, M.D.  PROCEDURE DATE: 09/03/2013 PROCEDURE:   EGD w/ biopsy and w/ GIVENS capsule placement  INDICATIONS:OBSCURE GI BLEED/ANEMIA(HB 7.20 Aug 2013) WHILE TAKING ASA DAILY.  PMHx: PVD.  CONTINUES TO SMOKE 2 PACKS A DAY. MEDICATIONS: Fentanyl 100 mcg IV and Versed 5 mg IV TOPICAL ANESTHETIC:   Viscous Xylocaine  DESCRIPTION OF PROCEDURE:     Physical exam was performed.  Informed consent was obtained from the patient after explaining the benefits, risks, and alternatives to the procedure.  The patient was connected to the monitor and placed in the left lateral position.  Continuous oxygen was provided by nasal cannula and IV medicine administered through an indwelling cannula.  After administration of sedation, the patients esophagus was intubated(x3) and the EG-[id number] ([id number])  endoscope was advanced under direct visualization to the second portion of the duodenum. The scope was removed slowly by carefully examining the color, texture, anatomy, and integrity of the mucosa on the way out.  The patient was recovered in endoscopy and discharged home in satisfactory condition.   ESOPHAGUS: The mucosa of the esophagus appeared normal.   STOMACH: Multiple small sessile polyps were found in the cardia and gastric fundus.  Multiple biopsies was performed.   Mild non-erosive gastritis (inflammation) was found in the gastric antrum.  Multiple biopsies were performed.   DUODENUM: The duodenal mucosa showed no abnormalities in the bulb and second portion of the duodenum. GIVENS CAPSULE RELEASED INTO SMALL BOWEL PRIOR TO BIOPSIES. COMPLICATIONS:   None  ENDOSCOPIC IMPRESSION: 1.  NO OBVIOUS SOURCE FOR ANEMIA IDENTIFIED 2.   Multiple small GASTRIC  polyps 3.   MILD Non-erosive gastritis  RECOMMENDATIONS: AWAIT GIVENS RESULTS. CONTINUE PROTONIX.  TAKE 30 MINUTES PRIOR TO YOUR FIRST MEAL. FOLLOW THE DIET INSTRUCTION ACCORDING TO CAPSULE INSTRUCTION. START A LOW FAT DIET AROUND 6 PM TODAY. RETURN THE RECORDER TOMORROW.  IF NO SOURCE FOR GI BLEED IDENTIFIED, PT MAY NEED A TCS WITH ABLATION F CECAL AVM. BIOPSY WILL BE BACK IN 7 DAYS. FOLLOW UP IN 4 MOS.   REPEAT EXAM:   _______________________________ Lorrin MaisBarney Drain, MD 09/03/2013 10:19 AM       PATIENT NAME:  [name] MR#: [id number]

## 2013-09-03 NOTE — H&P (Signed)
Primary Care Physician:  Redge Gainer, MD Primary Gastroenterologist:  Dr. Oneida Alar  Pre-Procedure History & Physical: HPI:  [name] is a 63 y.o. male here for  OBSCURE GIB/ANEMIA MAR 2015 HB 7.2.  Past Medical History  Diagnosis Date  . Diverticulosis 03/04/10  . Peripheral vascular disease, unspecified   . Undiagnosed cardiac murmurs   . Other symptoms involving cardiovascular system   . HLD (hyperlipidemia)   . HTN (hypertension)   . Type II or unspecified type diabetes mellitus without mention of complication, not stated as uncontrolled   . Ulcer   . Carotid artery occlusion   . Umbilical hernia now    has not been repaired  . Blood transfusion   . AVM (arteriovenous malformation) of colon with hemorrhage   . Gastritis   . Internal hemorrhoids   . Right carotid bruit   . Shortness of breath     noted /w low Hgb  . GERD (gastroesophageal reflux disease)   . Chronic kidney disease     renal failure /w osteomyelitis- 2010  . Arthritis     back  . Anemia     8/3,4,5- blood transfusion- [facility], colonoscopy- done & found 2 areas of bleeding   . Tobacco use disorder   . Anxiety   . CHF (congestive heart failure)     3 yrs ago  . Irregular heart beat     Past Surgical History  Procedure Laterality Date  . Colonoscopy  03/04/2010    Dr. Princella Pellegrini AMV without mention of ablation, scattered diverticula, internal hemorrhoids  . Bilateral common superficial femoral and profudus artery endarterectomies      2003    Dr. Sherren Mocha Early  . Esophagogastroduodenoscopy  03/2000    Dr. Tharon Aquas gastritis, H.Pylori gastritis, ?treated  . Esophagogastroduodenoscopy  05/2006    Dr. Marcello Fennel recurrent GI bleed. antral gastritis, single gastric AVM s/p APC, CLO results?  . Colonoscopy  11/2004    Dr. Barkley Bruns  . 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 Colonoscopy  01/22/2012    NUR: Two cecal AV malformations without  stigmata of bleed with maximal.meter of 8-10 mm. Both of these are ablated with argon plasma coagulator./ Small external hemorrhoids  . Esophagogastroduodenoscopy  01/21/2012    SLF: MILD TO MAODERATE Gastritis/ SLOW GIB LIKEY DUE TO PT BEING ON ASA ND SUBSEQUENT BLOOD/ LOSS FROM AVMS IN STOMACH AND COLON AS WELL AS GASTRITIS  . Enteroscopy  01/21/2012    Procedure: ENTEROSCOPY;  Surgeon: Danie Binder, MD;  Location: [facility] SUITE;  Service: Endoscopy;;  . No past surgeries    . Vascular surgery      rt bka and rt 1st toe amputation  . Below knee leg amputation      2010, wears prosthesis   . Endarterectomy  02/09/2012    Procedure: ENDARTERECTOMY CAROTID;  Surgeon: Rosetta Posner, MD;  Location: [facility] OR;  Service: Vascular;  Laterality: Left;  . Carotid endarterectomy Left 02-09-12    Prior to Admission medications   Medication Sig Start Date End Date Taking? Authorizing Provider  amLODipine (NORVASC) 10 MG tablet Take 1 tablet (10 mg total) by mouth daily before breakfast. 07/13/13  Yes [name] Done, FNP  atenolol (TENORMIN) 50 MG tablet Take 50 mg by mouth 2 (two) times daily.   Yes Historical Provider, MD  cloNIDine (CATAPRES) 0.1 MG tablet Take 1 tablet (0.1 mg total) by mouth 2 (two) times daily. 07/13/13  Yes Mary-Margaret  Hassell Done, FNP  furosemide (LASIX) 40 MG tablet Take 1 tablet (40 mg total) by mouth as needed. 07/13/13  Yes [name] Done, FNP  glipiZIDE (GLUCOTROL) 10 MG tablet Take 15 mg by mouth daily.   Yes Historical Provider, MD  pantoprazole (PROTONIX) 20 MG tablet Take 1 tablet (20 mg total) by mouth daily. 07/13/13  Yes [name] Done, FNP  silver sulfADIAZINE (SILVADENE) 1 % cream Apply 1 application topically 2 (two) times daily.   Yes Historical Provider, MD  simvastatin (ZOCOR) 40 MG tablet Take 1 tablet (40 mg total) by mouth at bedtime. 07/13/13  Yes [name] Done, FNP  aspirin EC 81 MG tablet Take 1 tablet (81 mg total) by mouth daily. 08/02/13   Radene Gunning, NP  Linaclotide Rolan Lipa) 145 MCG CAPS capsule Take 1 capsule (145 mcg total) by mouth daily. 08/02/13   Mahala Menghini, PA-C    Allergies as of 08/02/2013 - Review Complete 08/02/2013  Allergen Reaction Noted  . Zocor [simvastatin] Palpitations 03/07/2013  . Hctz [hydrochlorothiazide]  12/06/2012  . Iodine Hives 10/07/2010  . Sulfa antibiotics Other (See Comments) 10/07/2010    Family History  Problem Relation Age of Onset  . Colon cancer Brother 66    deceased  . Hyperlipidemia Brother   . Hypertension Brother   . Lung cancer Sister     deceased, three primary cancers: lung, esophageal, lymphoma.   . Diabetes Sister   . Hypertension Sister   . Cancer Father     gallbladder  . Hyperlipidemia Father   . Hypertension Father   . Cancer Brother     unknown primary  . Cancer Sister     unknown primary  . Hyperlipidemia Sister   . Diabetes Mother   . Hypertension Mother     History   Social History  . Marital Status: Married    Spouse Name: N/A    Number of Children: N/A  . Years of Education: N/A   Occupational History  . Not on file.   Social History Main Topics  . Smoking status: Current Every Day Smoker -- 2.00 packs/day for 50 years    Types: Cigarettes  . Smokeless tobacco: Former Systems developer    Quit date: 02/08/2012  . Alcohol Use: No  . Drug Use: No  . Sexual Activity: Not on file   Other Topics Concern  . Not on file   Social History Narrative  . No narrative on file    Review of Systems: See HPI, otherwise negative ROS   Physical Exam: BP 150/75  Pulse 77  Temp(Src) 98.3 F (36.8 C) (Oral)  Resp 18  Ht 5\' 4"  (1.626 m)  Wt 186 lb (84.369 kg)  BMI 31.91 kg/m2  SpO2 92% General:   Alert,  pleasant and cooperative in NAD Head:  Normocephalic and atraumatic. Neck:  Supple; Lungs:  Clear throughout to auscultation.    Heart:  Regular rate and rhythm. Abdomen:  Soft, nontender and nondistended. Normal bowel sounds, without guarding, and  without rebound.   Neurologic:  Alert and  oriented x4;  grossly normal neurologically.  Impression/Plan:      OBSCURE GI BLEED  PLAN:  1. EGD/POSSIBLE GIVENS PLACEMENT TODAY

## 2013-09-04 ENCOUNTER — Telehealth: Payer: Self-pay | Admitting: *Deleted

## 2013-09-04 NOTE — Telephone Encounter (Signed)
REVIEWED. WILL CALL PT TO DISCUSS MAR 18.

## 2013-09-04 NOTE — Telephone Encounter (Signed)
Pt's wife called for pt, stating Dr. Oneida Alar did his EGD on him yesterday and stated Dr. Oneida Alar wants pt to have a colonoscopy, pt's would like to get his TCS scheduled. Please advise [phone] or [phone]

## 2013-09-04 NOTE — Telephone Encounter (Signed)
Routing to Dr. Fields.  

## 2013-09-04 NOTE — Telephone Encounter (Signed)
Pt is aware that Dr. Oneida Alar will call him on 09/05/2013.

## 2013-09-05 ENCOUNTER — Encounter (HOSPITAL_COMMUNITY): Payer: Self-pay | Admitting: Gastroenterology

## 2013-09-05 LAB — GLUCOSE, CAPILLARY: GLUCOSE-CAPILLARY: 114 mg/dL — AB (ref 70–99)

## 2013-09-06 ENCOUNTER — Other Ambulatory Visit (INDEPENDENT_AMBULATORY_CARE_PROVIDER_SITE_OTHER): Payer: Medicaid Other

## 2013-09-06 DIAGNOSIS — R7989 Other specified abnormal findings of blood chemistry: Secondary | ICD-10-CM

## 2013-09-06 NOTE — Telephone Encounter (Signed)
Called patient TO DISCUSS RESULTS. EXPLAINED RESULTS. PT HAS SMALL BOWEL AND COLONIC AVMs. NO EVIDENCE FOR ACTIVE BLEEDING. PT NEEDS NPV WITH HEMATOLOGY WITHIN 7 DAYS, Dx: TRANSFUSION DEPENDENT ANEMIA, CHRONIC RENAL INSUFFICIENCY, SMALL BOWEL AND COLONIC AVMs. PT SHOULD BE EVALUATED FOR IVFE AND PROCRIT. PT WILL CALL MON IF HE HASN'T HEARD FROM HEMATOLOGY. HE WILL CALL SOONER IF HE SEES BRBPR OR BLACK TARRY STOOLS.

## 2013-09-06 NOTE — Telephone Encounter (Signed)
Pt's wife called back today wanting to speak with Dr. Oneida Alar, pt's wife states that they did not hear anything back yesterday about scheduling him for a colonoscopy. Please advise

## 2013-09-06 NOTE — Progress Notes (Signed)
Patient came in for labs only.

## 2013-09-07 ENCOUNTER — Other Ambulatory Visit: Payer: Self-pay | Admitting: Gastroenterology

## 2013-09-07 DIAGNOSIS — N189 Chronic kidney disease, unspecified: Secondary | ICD-10-CM

## 2013-09-07 DIAGNOSIS — D649 Anemia, unspecified: Secondary | ICD-10-CM

## 2013-09-07 NOTE — Telephone Encounter (Signed)
Referral has been made to [facility]

## 2013-09-10 ENCOUNTER — Telehealth: Payer: Self-pay | Admitting: *Deleted

## 2013-09-10 NOTE — Telephone Encounter (Signed)
I returned call. Pt's wife said pt could not explain to her what Dr. Nona Dell told him. So I read the report. They have not heard from hematology yet.  They were told to call today if they had not heard. Routing to Leigh ann to check on this.

## 2013-09-10 NOTE — Telephone Encounter (Signed)
Patient is scheduled on March 27 at 2:30 w/[facility]ology

## 2013-09-10 NOTE — Telephone Encounter (Signed)
Pt's wife called wanting to ask questions about iron injection that pt and Dr. Oneida Alar talked about last Thursday, pt does not understand all of it. Please advise [phone]

## 2013-09-10 NOTE — Telephone Encounter (Signed)
REVIEWED. SPOKE TO WIFE [phone]. EXPLAINED RESULTS AND PLAN. SHE VOICED HER UNDERSTANDING.

## 2013-09-13 ENCOUNTER — Other Ambulatory Visit (INDEPENDENT_AMBULATORY_CARE_PROVIDER_SITE_OTHER): Payer: Medicaid Other

## 2013-09-13 DIAGNOSIS — R7989 Other specified abnormal findings of blood chemistry: Secondary | ICD-10-CM

## 2013-09-13 LAB — POCT HEMOGLOBIN: Hemoglobin: 8 g/dL — AB (ref 14.1–18.1)

## 2013-09-13 NOTE — Progress Notes (Signed)
PAtient came in for labs only 

## 2013-09-14 ENCOUNTER — Encounter (HOSPITAL_BASED_OUTPATIENT_CLINIC_OR_DEPARTMENT_OTHER): Payer: Medicaid Other

## 2013-09-14 ENCOUNTER — Encounter (HOSPITAL_COMMUNITY): Payer: Self-pay

## 2013-09-14 VITALS — BP 149/78 | HR 90 | Temp 97.8°F | Resp 20 | Wt 185.4 lb

## 2013-09-14 DIAGNOSIS — K5520 Angiodysplasia of colon without hemorrhage: Secondary | ICD-10-CM

## 2013-09-14 DIAGNOSIS — D509 Iron deficiency anemia, unspecified: Secondary | ICD-10-CM

## 2013-09-14 DIAGNOSIS — D500 Iron deficiency anemia secondary to blood loss (chronic): Secondary | ICD-10-CM

## 2013-09-14 LAB — CBC WITH DIFFERENTIAL/PLATELET
BASOS ABS: 0.1 10*3/uL (ref 0.0–0.1)
Basophils Relative: 1 % (ref 0–1)
EOS PCT: 5 % (ref 0–5)
Eosinophils Absolute: 0.4 10*3/uL (ref 0.0–0.7)
HCT: 28.7 % — ABNORMAL LOW (ref 39.0–52.0)
Hemoglobin: 8.7 g/dL — ABNORMAL LOW (ref 13.0–17.0)
LYMPHS ABS: 1.2 10*3/uL (ref 0.7–4.0)
Lymphocytes Relative: 15 % (ref 12–46)
MCH: 24.8 pg — AB (ref 26.0–34.0)
MCHC: 30.3 g/dL (ref 30.0–36.0)
MCV: 81.8 fL (ref 78.0–100.0)
MONOS PCT: 6 % (ref 3–12)
Monocytes Absolute: 0.5 10*3/uL (ref 0.1–1.0)
NEUTROS ABS: 5.5 10*3/uL (ref 1.7–7.7)
Neutrophils Relative %: 73 % (ref 43–77)
PLATELETS: 275 10*3/uL (ref 150–400)
RBC: 3.51 MIL/uL — ABNORMAL LOW (ref 4.22–5.81)
RDW: 17 % — AB (ref 11.5–15.5)
WBC: 7.7 10*3/uL (ref 4.0–10.5)

## 2013-09-14 LAB — COMPREHENSIVE METABOLIC PANEL
ALK PHOS: 98 U/L (ref 39–117)
ALT: 13 U/L (ref 0–53)
AST: 24 U/L (ref 0–37)
Albumin: 2.8 g/dL — ABNORMAL LOW (ref 3.5–5.2)
BUN: 22 mg/dL (ref 6–23)
CO2: 27 meq/L (ref 19–32)
Calcium: 9.1 mg/dL (ref 8.4–10.5)
Chloride: 101 mEq/L (ref 96–112)
Creatinine, Ser: 2.12 mg/dL — ABNORMAL HIGH (ref 0.50–1.35)
GFR calc Af Amer: 36 mL/min — ABNORMAL LOW (ref >90)
GFR, EST NON AFRICAN AMERICAN: 31 mL/min — AB (ref >90)
Glucose, Bld: 96 mg/dL (ref 70–99)
POTASSIUM: 5.3 meq/L (ref 3.7–5.3)
SODIUM: 139 meq/L (ref 137–147)
Total Bilirubin: <0.2 mg/dL — ABNORMAL LOW (ref 0.3–1.2)
Total Protein: 6.9 g/dL (ref 6.0–8.3)

## 2013-09-14 LAB — IRON AND TIBC
Iron: 18 ug/dL — ABNORMAL LOW (ref 42–135)
Saturation Ratios: 6 % — ABNORMAL LOW (ref 20–55)
TIBC: 293 ug/dL (ref 215–435)
UIBC: 275 ug/dL (ref 125–400)

## 2013-09-14 LAB — RETICULOCYTES
RBC.: 3.51 MIL/uL — ABNORMAL LOW (ref 4.22–5.81)
Retic Count, Absolute: 80.7 10*3/uL (ref 19.0–186.0)
Retic Ct Pct: 2.3 % (ref 0.4–3.1)

## 2013-09-14 LAB — LACTATE DEHYDROGENASE: LDH: 268 U/L — AB (ref 94–250)

## 2013-09-14 NOTE — Progress Notes (Signed)
[name]'s reason for visit today is for labs as scheduled per MD orders.  Venipuncture performed with a 23 gauge butterfly needle to L Antecubital.  [name] tolerated procedure well and without incident; questions were answered and patient was discharged.

## 2013-09-14 NOTE — Progress Notes (Signed)
[employer] A. Barnet Glasgow, M.D.  NEW PATIENT EVALUATION   Name: [name] Date: 09/14/2013 MRN: [id number] DOB: [birth year]  PCP: Redge Gainer, MD   REFERRING PHYSICIAN: Danie Binder, MD  REASON FOR REFERRAL: Anemia     HISTORY OF PRESENT ILLNESS:[name] is a 62 y.o. 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 Upper endoscopy done on 09/02/2013 showed gastric polyps but no overt source of blood loss. 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 His brother's funeral was this morning having succumbed to advanced lung cancer. Patient has had a blood transfusion about 2 weeks ago but continues to have chronic blood loss with our stools. He denies any hematochezia, epistaxis, hemoptysis, or hematuria. He has been taking oral iron for the last 2 months without any benefit. He denies any increasing shortness of breath but has been fatigued. He denies craving for ice. He has not noticed any change in his fingernails or skin breakdown in the corners of his mouth. He denies any PND, orthopnea, palpitations, headache, or seizures.   PAST MEDICAL HISTORY:  has a past medical history of Diverticulosis (03/04/10); Peripheral vascular disease, unspecified; Undiagnosed cardiac murmurs; Other symptoms involving cardiovascular system; HLD (hyperlipidemia); HTN (hypertension); Type II or unspecified type diabetes mellitus without mention of complication, not stated as uncontrolled; Ulcer; Carotid artery occlusion; Umbilical hernia (now); Blood transfusion; AVM (arteriovenous malformation) of colon with hemorrhage; Gastritis; Internal hemorrhoids; Right carotid bruit; Shortness of breath; GERD (gastroesophageal reflux disease); Chronic kidney disease; Arthritis; Anemia; Tobacco use disorder; Anxiety;  CHF (congestive heart failure); and Irregular heart beat.     PAST SURGICAL HISTORY: Past Surgical History  Procedure Laterality Date  . Colonoscopy  03/04/2010    Dr. Princella Pellegrini AMV without mention of ablation, scattered diverticula, internal hemorrhoids  . Bilateral common superficial femoral and profudus artery endarterectomies      2003    Dr. Sherren Mocha Early  . Esophagogastroduodenoscopy  03/2000    Dr. Tharon Aquas gastritis, H.Pylori gastritis, ?treated  . Esophagogastroduodenoscopy  05/2006    Dr. Marcello Fennel recurrent GI bleed. antral gastritis, single gastric AVM s/p APC, CLO results?  . Colonoscopy  11/2004    Dr. Barkley Bruns  . 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 Both of these are ablated with argon plasma coagulator./ Small external hemorrhoids  . Esophagogastroduodenoscopy  01/21/2012    SLF: MILD TO MAODERATE Gastritis/ SLOW GIB LIKEY DUE TO PT BEING ON ASA ND SUBSEQUENT BLOOD/ LOSS FROM AVMS IN STOMACH AND COLON AS WELL AS GASTRITIS  . Enteroscopy  01/21/2012    Procedure: ENTEROSCOPY;  Surgeon: Danie Binder, MD;  [facility];  Service: Endoscopy;;  . No past surgeries    . Vascular surgery      rt bka and rt 1st toe amputation  . Below knee leg amputation      2010, wears prosthesis   . Endarterectomy  02/09/2012    Procedure: ENDARTERECTOMY CAROTID;  Surgeon: Rosetta Posner, MD;  Location: [facility];  Service: Vascular;  Laterality: Left;  . Carotid endarterectomy Left 02-09-12  . Esophagogastroduodenoscopy N/A 09/03/2013    Procedure: ESOPHAGOGASTRODUODENOSCOPY (  EGD);  Surgeon: Danie Binder, MD;  [facility];  Service: Endoscopy;  Laterality: N/A;  9:15-rescheduled to [place] notified pt  . Givens capsule study N/A 09/03/2013    Procedure: GIVENS CAPSULE STUDY;  Surgeon: Danie Binder, MD;  [facility];  Service: Endoscopy;  Laterality: N/A;     CURRENT MEDICATIONS: has a current medication list which includes the following prescription(s): amlodipine, atenolol, clonidine, furosemide, glipizide, linaclotide, pantoprazole, silver sulfadiazine, valsartan, and simvastatin.   ALLERGIES: Zocor; Hctz; Iodine; and Sulfa antibiotics   SOCIAL HISTORY:  reports that he has been smoking Cigarettes.  He has a 100 pack-year smoking history. He quit smokeless tobacco use about 19 months ago. He reports that he does not drink alcohol or use illicit drugs.   FAMILY HISTORY: family history includes Cancer in his brother, father, and sister; Colon cancer (age of onset: 31) in his brother; Diabetes in his mother and sister; Hyperlipidemia in his brother, father, and sister; Hypertension in his brother, father, mother, and sister; Lung cancer in his sister.    REVIEW OF SYSTEMS:  Other than that discussed above is noncontributory.    PHYSICAL EXAM:  weight is 185 lb 6.4 oz (84.097 kg). His oral temperature is 97.8 F (36.6 C). His blood pressure is 149/78 and his pulse is 90. His respiration is 20 and oxygen saturation is 98%.    GENERAL:alert, no distress and comfortable SKIN: skin color, texture, turgor are normal, no rashes or significant lesions EYES: normal, Conjunctiva are pink and non-injected, sclera clear OROPHARYNX:no exudate, no erythema and lips, buccal mucosa, and tongue normal  NECK: supple, thyroid normal size, non-tender, without nodularity CHEST: Increased AP diameter without gynecomastia. LYMPH:  no palpable lymphadenopathy in the cervical, axillary or inguinal LUNGS: clear to auscultation and percussion with normal breathing effort HEART: Grade 1-0/9 ejection systolic murmur at the low left total border that is nonradiating. No S3. ABDOMEN:abdomen soft, non-tender and normal bowel sounds MUSCULOSKELETALl:no cyanosis of digits, no clubbing or  edema  NEURO: alert & oriented x 3 with fluent speech, no focal motor/sensory deficits    LABORATORY DATA:  Results for [name], [name] (MRN [id number]) as of 09/14/2013 14:37  Ref. Range 01/20/2012 16:06 12/06/2012 08:52 08/01/2013 18:46  Ferritin Latest Range: 22-322 ng/mL 38 17 (L) 13 (L)     Office Visit on 09/14/2013  Component Date Value Ref Range Status  . WBC 09/14/2013 7.7  4.0 - 10.5 K/uL Final  . RBC 09/14/2013 3.51* 4.22 - 5.81 MIL/uL Final  . Hemoglobin 09/14/2013 8.7* 13.0 - 17.0 g/dL Final  . HCT 09/14/2013 28.7* 39.0 - 52.0 % Final  . MCV 09/14/2013 81.8  78.0 - 100.0 fL Final  . MCH 09/14/2013 24.8* 26.0 - 34.0 pg Final  . MCHC 09/14/2013 30.3  30.0 - 36.0 g/dL Final  . RDW 09/14/2013 17.0* 11.5 - 15.5 % Final  . Platelets 09/14/2013 275  150 - 400 K/uL Final  . Neutrophils Relative % 09/14/2013 PENDING  43 - 77 % Incomplete  . Neutro Abs 09/14/2013 PENDING  1.7 - 7.7 K/uL Incomplete  . Band Neutrophils 09/14/2013 PENDING  0 - 10 % Incomplete  . Lymphocytes Relative 09/14/2013 PENDING  12 - 46 % Incomplete  . Lymphs Abs 09/14/2013 PENDING  0.7 - 4.0 K/uL Incomplete  . Monocytes Relative 09/14/2013 PENDING  3 - 12 % Incomplete  . Monocytes Absolute 09/14/2013 PENDING  0.1 - 1.0 K/uL Incomplete  . Eosinophils Relative 09/14/2013 PENDING  0 - 5 % Incomplete  . Eosinophils Absolute 09/14/2013 PENDING  0.0 - 0.7 K/uL Incomplete  . Basophils Relative 09/14/2013 PENDING  0 - 1 % Incomplete  . Basophils Absolute 09/14/2013 PENDING  0.0 - 0.1 K/uL Incomplete  . WBC Morphology 09/14/2013 PENDING   Incomplete  . RBC Morphology 09/14/2013 PENDING   Incomplete  . Smear Review 09/14/2013 PENDING   Incomplete  . nRBC 09/14/2013 PENDING  0 /100 WBC Incomplete  . Metamyelocytes Relative 09/14/2013 PENDING   Incomplete  . Myelocytes 09/14/2013 PENDING   Incomplete  . Promyelocytes Absolute 09/14/2013 PENDING   Incomplete  . Blasts 09/14/2013 PENDING   Incomplete  . Retic Ct Pct  09/14/2013 2.3  0.4 - 3.1 % Final  . RBC. 09/14/2013 3.51* 4.22 - 5.81 MIL/uL Final  . Retic Count, Manual 09/14/2013 80.7  19.0 - 186.0 K/uL Final  Lab on 09/13/2013  Component Date Value Ref Range Status  . Hemoglobin 09/13/2013 8.0* 14.1 - 18.1 g/dL Final  Admission on 09/03/2013, Discharged on 09/03/2013  Component Date Value Ref Range Status  . Glucose-Capillary 09/03/2013 114* 70 - 99 mg/dL Final  . Comment 1 09/03/2013 Documented in Chart   Final  Lab on 08/30/2013  Component Date Value Ref Range Status  . Hemoglobin 08/30/2013 9.2* 14.1 - 18.1 g/dL Final   qc ok  Hospital Outpatient Visit on 08/24/2013  Component Date Value Ref Range Status  . ABO/RH(D) 08/24/2013 O POS   Final  . Antibody Screen 08/24/2013 NEG   Final  . Sample Expiration 08/24/2013 08/27/2013   Final  . Unit Number 08/24/2013 [id number]   Final  . Blood Component Type 08/24/2013 RED CELLS,LR   Final  . Unit division 08/24/2013 00   Final  . Status of Unit 08/24/2013 ISSUED,FINAL   Final  . Transfusion Status 08/24/2013 OK TO TRANSFUSE   Final  . Crossmatch Result 08/24/2013 Compatible   Final  . Unit Number 08/24/2013 [id number]   Final  . Blood Component Type 08/24/2013 RED CELLS,LR   Final  . Unit division 08/24/2013 00   Final  . Status of Unit 08/24/2013 ISSUED,FINAL   Final  . Transfusion Status 08/24/2013 OK TO TRANSFUSE   Final  . Crossmatch Result 08/24/2013 Compatible   Final  . Hemoglobin 08/24/2013 7.6* 13.0 - 17.0 g/dL Final  . HCT 08/24/2013 24.4* 39.0 - 52.0 % Final  . Order Confirmation 08/24/2013 [place]   Final  Office Visit on 08/23/2013  Component Date Value Ref Range Status  . Hemoglobin 08/23/2013 7.5* 14.1 - 18.1 g/dL Final    Urinalysis    Component Value Date/Time   COLORURINE YELLOW 02/03/2012 1140   APPEARANCEUR CLEAR 02/03/2012 1140   LABSPEC 1.011 02/03/2012 1140   PHURINE 6.5 02/03/2012 1140   GLUCOSEU NEGATIVE 02/03/2012 1140   HGBUR  NEGATIVE 02/03/2012 1140   BILIRUBINUR NEGATIVE 02/03/2012 1140   KETONESUR NEGATIVE 02/03/2012 1140   PROTEINUR >300* 02/03/2012 1140   UROBILINOGEN 0.2 02/03/2012 1140   NITRITE NEGATIVE 02/03/2012 1140   LEUKOCYTESUR NEGATIVE 02/03/2012 1140      @RADIOGRAPHY : No results found.  PATHOLOGY: Peripheral smear reveals hypochromic microcytic cells.   IMPRESSION:  #1. 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 #2. Inability of oral iron to keep up with chronic blood loss perhaps due to malabsorption from use of proton pump inhibitors. #3. Chronic obstructive pulmonary disease. #4. Diabetes mellitus, type II, non-insulin requiring. #  5. Gastroesophageal reflux disease, on proton pump inhibitor. #6. Hypertension, controlled.   PLAN:  #1. Intravenous Feraheme 1020 mg on 09/17/2013. #2. Followup in 4 weeks with CBC and ferritin.  I appreciate the opportunity of sharing in his care.   Doroteo Bradford, MD 09/14/2013 3:07 PM

## 2013-09-14 NOTE — Patient Instructions (Signed)
[facility] Discharge Instructions  RECOMMENDATIONS MADE BY THE CONSULTANT AND ANY TEST RESULTS WILL BE SENT TO YOUR REFERRING PHYSICIAN.  EXAM FINDINGS BY THE PHYSICIAN TODAY AND SIGNS OR SYMPTOMS TO REPORT TO CLINIC OR PRIMARY PHYSICIAN: Exam and findings as discussed by Dr. Barnet Glasgow.  Will plan for iron infusion on Monday.  Report increased fatigue or shortness of breath.  MEDICATIONS PRESCRIBED:  You can stop the oral iron.  INSTRUCTIONS/FOLLOW-UP: Follow-up in 4 weeks with lab and office visit.  Thank you for choosing [facility] to provide your oncology and hematology care.  To afford each patient quality time with our providers, please arrive at least 15 minutes before your scheduled appointment time.  With your help, our goal is to use those 15 minutes to complete the necessary work-up to ensure our physicians have the information they need to help with your evaluation and healthcare recommendations.    Effective January 1st, 2014, we ask that you re-schedule your appointment with our physicians should you arrive 10 or more minutes late for your appointment.  We strive to give you quality time with our providers, and arriving late affects you and other patients whose appointments are after yours.    Again, thank you for choosing [facility].  Our hope is that these requests will decrease the amount of time that you wait before being seen by our physicians.       _____________________________________________________________  Should you have questions after your visit to [facility], please contact our office at [phone] between the hours of 8:30 a.m. and 5:00 p.m.  Voicemails left after 4:30 p.m. will not be returned until the following business day.  For prescription refill requests, have your pharmacy contact our office with your prescription refill request.

## 2013-09-15 LAB — FOLATE: Folate: >20 ng/mL

## 2013-09-15 LAB — FERRITIN: Ferritin: 14 ng/mL — ABNORMAL LOW (ref 22–322)

## 2013-09-15 LAB — VITAMIN B12: Vitamin B-12: 778 pg/mL (ref 211–911)

## 2013-09-17 ENCOUNTER — Encounter (HOSPITAL_BASED_OUTPATIENT_CLINIC_OR_DEPARTMENT_OTHER): Payer: Medicaid Other

## 2013-09-17 VITALS — BP 143/66 | HR 78 | Temp 98.0°F | Resp 18

## 2013-09-17 DIAGNOSIS — K5521 Angiodysplasia of colon with hemorrhage: Secondary | ICD-10-CM

## 2013-09-17 DIAGNOSIS — D62 Acute posthemorrhagic anemia: Secondary | ICD-10-CM

## 2013-09-17 DIAGNOSIS — D500 Iron deficiency anemia secondary to blood loss (chronic): Secondary | ICD-10-CM

## 2013-09-17 LAB — ERYTHROPOIETIN: ERYTHROPOIETIN: 209.2 m[IU]/mL — AB (ref 2.6–18.5)

## 2013-09-17 MED ORDER — SODIUM CHLORIDE 0.9 % IJ SOLN: 10.0000 mL | INTRAMUSCULAR | Status: DC | PRN

## 2013-09-17 MED ORDER — SODIUM CHLORIDE 0.9 % IV SOLN
1020.0000 mg | Freq: Once | INTRAVENOUS | Status: AC
  Administered 2013-09-17: 1020 mg via INTRAVENOUS
  Filled 2013-09-17: qty 34

## 2013-09-17 MED ORDER — SODIUM CHLORIDE 0.9 % IV SOLN
Freq: Once | INTRAVENOUS | Status: AC
  Administered 2013-09-17: 11:00:00 via INTRAVENOUS

## 2013-09-17 NOTE — Progress Notes (Signed)
[name] Tolerated ferritin IV today with incident

## 2013-09-27 ENCOUNTER — Other Ambulatory Visit (HOSPITAL_COMMUNITY): Payer: Medicaid Other

## 2013-09-27 ENCOUNTER — Encounter (HOSPITAL_COMMUNITY): Payer: Medicaid Other

## 2013-09-27 ENCOUNTER — Ambulatory Visit: Payer: Medicaid Other | Admitting: Family

## 2013-10-15 ENCOUNTER — Encounter (HOSPITAL_COMMUNITY): Payer: Medicaid Other | Attending: Family Medicine

## 2013-10-15 ENCOUNTER — Encounter (HOSPITAL_BASED_OUTPATIENT_CLINIC_OR_DEPARTMENT_OTHER): Payer: Medicaid Other

## 2013-10-15 ENCOUNTER — Encounter (HOSPITAL_COMMUNITY): Payer: Self-pay

## 2013-10-15 VITALS — BP 135/76 | HR 96 | Temp 97.8°F | Resp 20 | Wt 180.4 lb

## 2013-10-15 DIAGNOSIS — Q2733 Arteriovenous malformation of digestive system vessel: Secondary | ICD-10-CM

## 2013-10-15 DIAGNOSIS — D500 Iron deficiency anemia secondary to blood loss (chronic): Secondary | ICD-10-CM | POA: Insufficient documentation

## 2013-10-15 DIAGNOSIS — D509 Iron deficiency anemia, unspecified: Secondary | ICD-10-CM

## 2013-10-15 DIAGNOSIS — K5521 Angiodysplasia of colon with hemorrhage: Secondary | ICD-10-CM

## 2013-10-15 DIAGNOSIS — E119 Type 2 diabetes mellitus without complications: Secondary | ICD-10-CM

## 2013-10-15 DIAGNOSIS — K5730 Diverticulosis of large intestine without perforation or abscess without bleeding: Secondary | ICD-10-CM

## 2013-10-15 DIAGNOSIS — F17200 Nicotine dependence, unspecified, uncomplicated: Secondary | ICD-10-CM

## 2013-10-15 DIAGNOSIS — K219 Gastro-esophageal reflux disease without esophagitis: Secondary | ICD-10-CM

## 2013-10-15 DIAGNOSIS — I10 Essential (primary) hypertension: Secondary | ICD-10-CM

## 2013-10-15 DIAGNOSIS — J449 Chronic obstructive pulmonary disease, unspecified: Secondary | ICD-10-CM

## 2013-10-15 LAB — CBC WITH DIFFERENTIAL/PLATELET
Basophils Absolute: 0 10*3/uL (ref 0.0–0.1)
Basophils Relative: 0 % (ref 0–1)
EOS ABS: 0.5 10*3/uL (ref 0.0–0.7)
Eosinophils Relative: 7 % — ABNORMAL HIGH (ref 0–5)
HCT: 32.6 % — ABNORMAL LOW (ref 39.0–52.0)
HEMOGLOBIN: 10.2 g/dL — AB (ref 13.0–17.0)
Lymphocytes Relative: 12 % (ref 12–46)
Lymphs Abs: 0.8 10*3/uL (ref 0.7–4.0)
MCH: 27.6 pg (ref 26.0–34.0)
MCHC: 31.3 g/dL (ref 30.0–36.0)
MCV: 88.3 fL (ref 78.0–100.0)
Monocytes Absolute: 0.4 10*3/uL (ref 0.1–1.0)
Monocytes Relative: 6 % (ref 3–12)
NEUTROS ABS: 4.9 10*3/uL (ref 1.7–7.7)
NEUTROS PCT: 75 % (ref 43–77)
PLATELETS: 265 10*3/uL (ref 150–400)
RBC: 3.69 MIL/uL — ABNORMAL LOW (ref 4.22–5.81)
RDW: 21.7 % — ABNORMAL HIGH (ref 11.5–15.5)
WBC: 6.5 10*3/uL (ref 4.0–10.5)

## 2013-10-15 LAB — FERRITIN: Ferritin: 65 ng/mL (ref 22–322)

## 2013-10-15 NOTE — Progress Notes (Signed)
Labs drawn today for cbc/diff,ferr 

## 2013-10-15 NOTE — Progress Notes (Signed)
[facility]  OFFICE PROGRESS NOTE  [address], [place] [address]  DIAGNOSIS: Chronic blood loss anemia - Plan: CBC with Differential, Ferritin, CBC with Differential, Ferritin  Iron deficiency anemia - Plan: CBC with Differential, Ferritin  AVM (arteriovenous malformation) of colon with hemorrhage  Chief Complaint  Patient presents with  . Chronic blood loss anemia    Intestinal AVMs    CURRENT THERAPY: Feraheme intravenously 09/17/2013  INTERVAL HISTORY: [name] 63 y.o. male returns for followup after receiving intravenous iron for iron deficiency anemia secondary to chronic blood loss from intestinal AVMs.  He feels more energetic since receiving intravenous iron. He is concerned about numbness involving the left knee on occasion. He sat back pain with no fever, night sweats, sore throat, PND, orthopnea, palpitations, and does want to stop smoking. Bowel habits are regular with no melena, metaplasia, hematuria, epistaxis, or hemoptysis.  MEDICAL HISTORY: Past Medical History  Diagnosis Date  . Diverticulosis 03/04/10  . Peripheral vascular disease, unspecified   . Undiagnosed cardiac murmurs   . Other symptoms involving cardiovascular system   . HLD (hyperlipidemia)   . HTN (hypertension)   . Type II or unspecified type diabetes mellitus without mention of complication, not stated as uncontrolled   . Ulcer   . Carotid artery occlusion   . Umbilical hernia now    has not been repaired  . Blood transfusion   . AVM (arteriovenous malformation) of colon with hemorrhage   . Gastritis   . Internal hemorrhoids   . Right carotid bruit   . Shortness of breath     noted /w low Hgb  . GERD (gastroesophageal reflux disease)   . Chronic kidney disease     renal failure /w osteomyelitis- 2010  . Arthritis     back  . Anemia     8/3,4,5- blood transfusion- [facility], colonoscopy- done & found 2 areas of  bleeding   . Tobacco use disorder   . Anxiety   . CHF (congestive heart failure)     3 yrs ago  . Irregular heart beat     INTERIM HISTORY: has DIABETES MELLITUS; HYPERLIPIDEMIA; TOBACCO USER; HYPERTENSION; PERIPHERAL VASCULAR DISEASE; SYSTOLIC MURMUR; CAROTID BRUIT, RIGHT; CAD (coronary artery disease); CKD (chronic kidney disease); Amputee, below knee; Diabetic peripheral neuropathy; Anemia; Noncompliance; Occlusion and stenosis of carotid artery without mention of cerebral infarction; Acute blood loss anemia; GI bleeding; Hyponatremia; Acute renal failure; Aftercare following surgery of the circulatory system, NEC; AVM (arteriovenous malformation) of colon with hemorrhage; GERD (gastroesophageal reflux disease); Anxiety; and CHF (congestive heart failure) on his problem list.    ALLERGIES:  is allergic to zocor; hctz; iodine; and sulfa antibiotics.  MEDICATIONS: has a current medication list which includes the following prescription(s): amlodipine, aspirin, atenolol, clonidine, docusate sodium, furosemide, glipizide, methylcellulose, pantoprazole, silver sulfadiazine, valsartan, linaclotide, and simvastatin.  SURGICAL HISTORY:  Past Surgical History  Procedure Laterality Date  . Colonoscopy  03/04/2010    Dr. Princella Pellegrini AMV without mention of ablation, scattered diverticula, internal hemorrhoids  . Bilateral common superficial femoral and profudus artery endarterectomies      2003    Dr. Sherren Mocha Early  . Esophagogastroduodenoscopy  03/2000    Dr. Tharon Aquas gastritis, H.Pylori gastritis, ?treated  . Esophagogastroduodenoscopy  05/2006    Dr. Marcello Fennel recurrent GI bleed. antral gastritis, single gastric AVM s/p APC, CLO results?  . Colonoscopy  11/2004    Dr. Barkley Bruns  .  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 Both of  these are ablated with argon plasma coagulator./ Small external hemorrhoids  . Esophagogastroduodenoscopy  01/21/2012    SLF: MILD TO MAODERATE Gastritis/ SLOW GIB LIKEY DUE TO PT BEING ON ASA ND SUBSEQUENT BLOOD/ LOSS FROM AVMS IN STOMACH AND COLON AS WELL AS GASTRITIS  . Enteroscopy  01/21/2012    Procedure: ENTEROSCOPY;  Surgeon: Danie Binder, MD;  [facility];  Service: Endoscopy;;  . No past surgeries    . Vascular surgery      rt bka and rt 1st toe amputation  . Below knee leg amputation      2010, wears prosthesis   . Endarterectomy  02/09/2012    Procedure: ENDARTERECTOMY CAROTID;  Surgeon: Rosetta Posner, MD;  Location: [facility];  Service: Vascular;  Laterality: Left;  . Carotid endarterectomy Left 02-09-12  . Esophagogastroduodenoscopy N/A 09/03/2013    Procedure: ESOPHAGOGASTRODUODENOSCOPY (EGD);  Surgeon: Danie Binder, MD;  [facility];  Service: Endoscopy;  Laterality: N/A;  9:15-rescheduled to [place] notified pt  . Givens capsule study N/A 09/03/2013    Procedure: GIVENS CAPSULE STUDY;  Surgeon: Danie Binder, MD;  [facility];  Service: Endoscopy;  Laterality: N/A;    FAMILY HISTORY: family history includes Cancer in his brother, father, and sister; Colon cancer (age of onset: 66) in his brother; Diabetes in his mother and sister; Hyperlipidemia in his brother, father, and sister; Hypertension in his brother, father, mother, and sister; Lung cancer in his sister.  SOCIAL HISTORY:  reports that he has been smoking Cigarettes.  He has a 100 pack-year smoking history. He quit smokeless tobacco use about 20 months ago. He reports that he does not drink alcohol or use illicit drugs.  REVIEW OF SYSTEMS:  Other than that discussed above is noncontributory.  PHYSICAL EXAMINATION: ECOG PERFORMANCE STATUS: 1 - Symptomatic but completely ambulatory  Blood pressure 135/76, pulse 96, temperature 97.8 F (36.6 C), temperature source Oral, resp. rate  20, weight 180 lb 6.4 oz (81.829 kg), SpO2 94.00%.  GENERAL:alert, no distress and comfortable SKIN: skin color, texture, turgor are normal, no rashes or significant lesions EYES: PERLA; Conjunctiva are pink and non-injected, sclera clear SINUSES: No redness or tenderness over maxillary or ethmoid sinuses OROPHARYNX:no exudate, no erythema on lips, buccal mucosa, or tongue. NECK: supple, thyroid normal size, non-tender, without nodularity. No masses CHEST: Increased AP diameter with no breast masses. LYMPH:  no palpable lymphadenopathy in the cervical, axillary or inguinal LUNGS: clear to auscultation and percussion with normal breathing effort HEART: regular rate & rhythm and no murmurs. ABDOMEN:abdomen soft, non-tender and normal bowel sounds MUSCULOSKELETAL:no cyanosis of digits and no clubbing. Range of motion normal. . Right below the knee amputation with prosthesis in place. Slight swelling of the left knee without evidence of effusion. NEURO: alert & oriented x 3 with fluent speech, no focal motor/sensory deficits   LABORATORY DATA:  Results for TADHG, ESKEW (MRN [id number]) as of 10/15/2013 11:18  Ref. Range 09/14/2013 14:30 10/15/2013 10:13  Hemoglobin Latest Range: 13.0-17.0 g/dL 8.7 (L) 10.2 (L)     Infusion on 10/15/2013  Component Date Value Ref Range Status  . WBC 10/15/2013 6.5  4.0 - 10.5 K/uL Final  . RBC 10/15/2013 3.69* 4.22 - 5.81 MIL/uL Final  .  Hemoglobin 10/15/2013 10.2* 13.0 - 17.0 g/dL Final  . HCT 10/15/2013 32.6* 39.0 - 52.0 % Final  . MCV 10/15/2013 88.3  78.0 - 100.0 fL Final  . MCH 10/15/2013 27.6  26.0 - 34.0 pg Final  . MCHC 10/15/2013 31.3  30.0 - 36.0 g/dL Final  . RDW 10/15/2013 21.7* 11.5 - 15.5 % Final  . Platelets 10/15/2013 265  150 - 400 K/uL Final  . Neutrophils Relative % 10/15/2013 75  43 - 77 % Final  . Neutro Abs 10/15/2013 4.9  1.7 - 7.7 K/uL Final  . Lymphocytes Relative 10/15/2013 12  12 - 46 % Final  . Lymphs Abs 10/15/2013 0.8   0.7 - 4.0 K/uL Final  . Monocytes Relative 10/15/2013 6  3 - 12 % Final  . Monocytes Absolute 10/15/2013 0.4  0.1 - 1.0 K/uL Final  . Eosinophils Relative 10/15/2013 7* 0 - 5 % Final  . Eosinophils Absolute 10/15/2013 0.5  0.0 - 0.7 K/uL Final  . Basophils Relative 10/15/2013 0  0 - 1 % Final  . Basophils Absolute 10/15/2013 0.0  0.0 - 0.1 K/uL Final    PATHOLOGY: No new pathology.  Urinalysis    Component Value Date/Time   COLORURINE YELLOW 02/03/2012 1140   APPEARANCEUR CLEAR 02/03/2012 1140   LABSPEC 1.011 02/03/2012 1140   PHURINE 6.5 02/03/2012 1140   GLUCOSEU NEGATIVE 02/03/2012 1140   HGBUR NEGATIVE 02/03/2012 1140   BILIRUBINUR NEGATIVE 02/03/2012 1140   KETONESUR NEGATIVE 02/03/2012 1140   PROTEINUR >300* 02/03/2012 1140   UROBILINOGEN 0.2 02/03/2012 1140   NITRITE NEGATIVE 02/03/2012 1140   LEUKOCYTESUR NEGATIVE 02/03/2012 1140    RADIOGRAPHIC STUDIES: No results found.  ASSESSMENT:  #1. 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  #2. Inability of oral iron to keep up with chronic blood loss perhaps due to malabsorption from use of proton pump inhibitors.  #3. Chronic obstructive pulmonary disease.  #4. Diabetes mellitus, type II, non-insulin requiring.  #5. Gastroesophageal reflux disease, on proton pump inhibitor.  #6. Hypertension, controlled    PLAN:  #1. Decrease smoking by one cigarette each day until down to 3 or 4 per day. #2. Followup in 3 months with CBC and ferritin. Patient was told to call should he develop increasing fatigue so that CBC could be checked in between.   All questions were answered. The patient knows to call the clinic with any problems, questions or concerns. We can certainly see the patient much sooner if necessary.   I spent 25 minutes counseling the patient face to face. The total time spent in the appointment was 30 minutes.    Farrel Gobble, MD 10/15/2013 11:18 AM  DISCLAIMER:  This note was dictated with voice recognition software.  Similar sounding words can inadvertently be transcribed inaccurately and may not be corrected upon review.

## 2013-10-15 NOTE — Patient Instructions (Signed)
[facility] Discharge Instructions  RECOMMENDATIONS MADE BY THE CONSULTANT AND ANY TEST RESULTS WILL BE SENT TO YOUR REFERRING PHYSICIAN.  We will see you in 3 months for a doctor visit and we will do lab work.   Thank you for choosing [facility] to provide your oncology and hematology care.  To afford each patient quality time with our providers, please arrive at least 15 minutes before your scheduled appointment time.  With your help, our goal is to use those 15 minutes to complete the necessary work-up to ensure our physicians have the information they need to help with your evaluation and healthcare recommendations.    Effective January 1st, 2014, we ask that you re-schedule your appointment with our physicians should you arrive 10 or more minutes late for your appointment.  We strive to give you quality time with our providers, and arriving late affects you and other patients whose appointments are after yours.    Again, thank you for choosing [facility].  Our hope is that these requests will decrease the amount of time that you wait before being seen by our physicians.       _____________________________________________________________  Should you have questions after your visit to [facility], please contact our office at [phone] between the hours of 8:30 a.m. and 5:00 p.m.  Voicemails left after 4:30 p.m. will not be returned until the following business day.  For prescription refill requests, have your pharmacy contact our office with your prescription refill request.

## 2013-11-15 ENCOUNTER — Encounter: Payer: Self-pay | Admitting: *Deleted

## 2013-11-20 ENCOUNTER — Telehealth: Payer: Self-pay | Admitting: *Deleted

## 2013-11-20 ENCOUNTER — Telehealth (HOSPITAL_COMMUNITY): Payer: Self-pay

## 2013-11-20 NOTE — Telephone Encounter (Signed)
Per Dr. Barnet Glasgow, patient does not need to continue taking oral iron.  Cindy Rosenberger notified and verbalized understanding of instructions.

## 2013-11-20 NOTE — Telephone Encounter (Signed)
I called pt and told him that I will check with Dr. Oneida Alar. He has OV with Laban Emperor, NP on 12/24/2013 at 1:30 PM.

## 2013-11-20 NOTE — Telephone Encounter (Signed)
PLEASE CALL PT. I DO NOT SEE A NEED FOR IRON PILLS IF HE'S GETTING IRON INFUSIONS, BUT HE SHOULD CALL [facility]. [facility] AND CONFIRM THAT HE DOES NOT NEED IRON PILLS.

## 2013-11-20 NOTE — Telephone Encounter (Signed)
Called and informed pts wife

## 2013-11-20 NOTE — Telephone Encounter (Signed)
Pt's wife called wanting to know for pt if he needs to keep taking iron because he a iron transfusion in April. Please advise [phone].

## 2013-11-28 NOTE — Telephone Encounter (Signed)
REVIEWED.  

## 2013-12-24 ENCOUNTER — Ambulatory Visit (INDEPENDENT_AMBULATORY_CARE_PROVIDER_SITE_OTHER): Payer: Medicaid Other | Admitting: Gastroenterology

## 2013-12-24 ENCOUNTER — Ambulatory Visit: Payer: Medicaid Other | Admitting: Gastroenterology

## 2013-12-24 ENCOUNTER — Encounter: Payer: Self-pay | Admitting: Gastroenterology

## 2013-12-24 ENCOUNTER — Encounter (INDEPENDENT_AMBULATORY_CARE_PROVIDER_SITE_OTHER): Payer: Self-pay

## 2013-12-24 VITALS — BP 140/68 | HR 78 | Temp 99.0°F | Ht 66.0 in | Wt 174.2 lb

## 2013-12-24 DIAGNOSIS — D509 Iron deficiency anemia, unspecified: Secondary | ICD-10-CM

## 2013-12-24 NOTE — Patient Instructions (Signed)
Please follow the diet sheet attached. Please call if your symptoms do not improve OR you lose more weight.  We will see you in 3 months.    Gastroparesis  Gastroparesis is also called slowed stomach emptying (delayed gastric emptying). It is a condition in which the stomach takes too long to empty its contents. It often happens in people with diabetes.  CAUSES  Gastroparesis happens when nerves to the stomach are damaged or stop working. When the nerves are damaged, the muscles of the stomach and intestines do not work normally. The movement of food is slowed or stopped. High blood glucose (sugar) causes changes in nerves and can damage the blood vessels that carry oxygen and nutrients to the nerves. RISK FACTORS  Diabetes.  Post-viral syndromes.  Eating disorders (anorexia, bulimia).  Surgery on the stomach or vagus nerve.  Gastroesophageal reflux disease (rarely).  Smooth muscle disorders (amyloidosis, scleroderma).  Metabolic disorders, including hypothyroidism.  Parkinson disease. SYMPTOMS   Heartburn.  Feeling sick to your stomach (nausea).  Vomiting of undigested food.  An early feeling of fullness when eating.  Weight loss.  Abdominal bloating.  Erratic blood glucose levels.  Lack of appetite.  Gastroesophageal reflux.  Spasms of the stomach wall. Complications can include:  Bacterial overgrowth in stomach. Food stays in the stomach and can ferment and cause bacteria to grow.  Weight loss due to difficulty digesting and absorbing nutrients.  Vomiting.  Obstruction in the stomach. Undigested food can harden and cause nausea and vomiting.  Blood glucose fluctuations caused by inconsistent food absorption. DIAGNOSIS  The diagnosis of gastroparesis is confirmed through one or more of the following tests:  Barium X-rays and scans. These tests look at how long it takes for food to move through the stomach.  Gastric manometry. This test measures  electrical and muscular activity in the stomach. A thin tube is passed down the throat into the stomach. The tube contains a wire that takes measurements of the stomach's electrical and muscular activity as it digests liquids and solid food.  Endoscopy. This procedure is done with a long, thin tube called an endoscope. It is passed through the mouth and gently down the esophagus into the stomach. This tube helps the caregiver look at the lining of the stomach to check for any abnormalities.  Ultrasonography. This can rule out gallbladder disease or pancreatitis. This test will outline and define the shape of the gallbladder and pancreas. TREATMENT   Treatments may include:  Exercise.  Medicines to control nausea and vomiting.  Medicines to stimulate stomach muscles.  Changes in what and when you eat.  Having smaller meals more often.  Eating low-fiber forms of high-fiber foods, such as eating cooked vegetables instead of raw vegetables.  Eating low-fat foods.  Consuming liquids, which are easier to digest.  In severe cases, feeding tubes and intravenous (IV) feeding may be needed. It is important to note that in most cases, treatment does not cure gastroparesis. It is usually a lasting (chronic) condition. Treatment helps you manage the underlying condition so that you can be as healthy and comfortable as possible. Other treatments  A gastric neurostimulator has been developed to assist people with gastroparesis. The battery-operated device is surgically implanted. It emits mild electrical pulses to help improve stomach emptying and to control nausea and vomiting.  The use of botulinum toxin has been shown to improve stomach emptying by decreasing the prolonged contractions of the muscle between the stomach and the small intestine (pyloric sphincter).  The benefits are temporary. SEEK MEDICAL CARE IF:   You have diabetes and you are having problems keeping your blood glucose in goal  range.  You are having nausea, vomiting, bloating, or early feelings of fullness with eating.  Your symptoms do not change with a change in diet. Document Released: 06/07/2005 Document Revised: 10/02/2012 Document Reviewed: 11/14/2008 [facility] Patient Information 2015 [place], [place]. This information is not intended to replace advice given to you by your health care provider. Make sure you discuss any questions you have with your health care provider.

## 2013-12-24 NOTE — Assessment & Plan Note (Signed)
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 Vague reports of intermittent nausea, early satiety. Unintentional weight loss likely multifactorial due to decreased oral intake, increased activity per family. Question possible mild delayed gastric emptying as culprit of some symptoms. Offered GES; patient declined. Provided gastroparesis diet as starting point. Continue Protonix 20 mg BID, follow-up with hematology as planned, return in 3 months for weight check.

## 2013-12-24 NOTE — Progress Notes (Signed)
Referring Provider: Chipper Herb, MD Primary Care Physician:  Redge Gainer, MD Primary GI: Dr. Oneida Alar   Chief Complaint  Patient presents with  . 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 EGD on file recently with subsequent capsule study showing non-bleeding small intestine and colonic AVMs. Received iron infusion last in April 2015. Due to see Hematology again in July.  Every once in awhile will wake up with nausea. No vomiting. Takes some TUMS, which relieves the nausea. Notes three times in the past month. Good appetite. Sometimes feels full with small amounts of food. No bloating. Unintentional weight loss of 6 lbs since April 2015.   Past Medical History  Diagnosis Date  . Diverticulosis 03/04/10  . Peripheral vascular disease, unspecified   . Undiagnosed cardiac murmurs   . Other symptoms involving cardiovascular system   . HLD (hyperlipidemia)   . HTN (hypertension)   . Type II or unspecified type diabetes mellitus without mention of complication, not stated as uncontrolled   . Ulcer   . Carotid artery occlusion   . Umbilical hernia now    has not been repaired  . Blood transfusion   . AVM (arteriovenous malformation) of colon with hemorrhage   . Gastritis   . Internal hemorrhoids   . Right carotid bruit   . Shortness of breath     noted /w low Hgb  . GERD (gastroesophageal reflux disease)   . Chronic kidney disease     renal failure /w osteomyelitis- 2010  . Arthritis     back  . Anemia     8/3,4,5- blood transfusion- [facility], colonoscopy- done & found 2 areas of bleeding   . Tobacco use disorder   . Anxiety   . CHF (congestive heart failure)     3 yrs ago  . Irregular heart beat     Past Surgical History  Procedure Laterality Date  . Colonoscopy  03/04/2010    Dr. Princella Pellegrini AMV without mention of ablation, scattered diverticula, internal hemorrhoids  .  Bilateral common superficial femoral and profudus artery endarterectomies      2003    Dr. Sherren Mocha Early  . Esophagogastroduodenoscopy  03/2000    Dr. Tharon Aquas gastritis, H.Pylori gastritis, ?treated  . Esophagogastroduodenoscopy  05/2006    Dr. Marcello Fennel recurrent GI bleed. antral gastritis, single gastric AVM s/p APC, CLO results?  . Colonoscopy  11/2004    Dr. Barkley Bruns  . 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 Both of these are ablated with argon plasma coagulator./ Small external hemorrhoids  . Esophagogastroduodenoscopy  01/21/2012    SLF: MILD TO MAODERATE Gastritis/ SLOW GIB LIKEY DUE TO PT BEING ON ASA ND SUBSEQUENT BLOOD/ LOSS FROM AVMS IN STOMACH AND COLON AS WELL AS GASTRITIS  . Enteroscopy  01/21/2012    Procedure: ENTEROSCOPY;  Surgeon: Danie Binder, MD;  Location: [facility];  Service: Endoscopy;;  . No past surgeries    . Vascular surgery      rt bka and rt 1st toe amputation  . Below knee leg amputation      2010, wears prosthesis   . Endarterectomy  02/09/2012    Procedure: ENDARTERECTOMY CAROTID;  Surgeon: Rosetta Posner, MD;  Location: [facility];  Service: Vascular;  Laterality: Left;  . Carotid endarterectomy Left 02-09-12  . Esophagogastroduodenoscopy N/A 09/03/2013    Mild chronic gastritis. benign gastric polyps  . Givens capsule study N/A 09/03/2013    Dr. Oneida Alar: small bowel and colonic AVMs. no active bleeding    Current Outpatient Prescriptions  Medication Sig Dispense Refill  . amLODipine (NORVASC) 10 MG tablet Take 1 tablet (10 mg total) by mouth daily before breakfast.  30 tablet  5  . aspirin 81 MG tablet Take 81 mg by mouth daily.      Marland Kitchen atenolol (TENORMIN) 50 MG tablet Take 50 mg by mouth 2 (two) times daily.      . cloNIDine (CATAPRES) 0.1 MG tablet Take 1 tablet (0.1 mg total) by mouth 2 (two)  times daily.  60 tablet  5  . furosemide (LASIX) 40 MG tablet Take 1 tablet (40 mg total) by mouth as needed.  30 tablet  5  . glipiZIDE (GLUCOTROL) 10 MG tablet Take 15 mg by mouth daily. Takes one tablet in the Am and 1/2 tablet in the pm      . pantoprazole (PROTONIX) 20 MG tablet Take 20 mg by mouth 2 (two) times daily before a meal.      . polyethylene glycol powder (GLYCOLAX/MIRALAX) powder Take 1 Container by mouth once. Only as needed      . silver sulfADIAZINE (SILVADENE) 1 % cream Apply 1 application topically 2 (two) times daily.      . valsartan (DIOVAN) 320 MG tablet Take 320 mg by mouth daily.       No current facility-administered medications for this visit.    Allergies as of 12/24/2013 - Review Complete 12/24/2013  Allergen Reaction Noted  . Zocor [simvastatin] Palpitations 03/07/2013  . Hctz [hydrochlorothiazide]  12/06/2012  . Iodine Hives 10/07/2010  . Sulfa antibiotics Other (See Comments) 10/07/2010    Family History  Problem Relation Age of Onset  . Colon cancer Brother 93    deceased  . Hyperlipidemia Brother   . Hypertension Brother   . Lung cancer Sister     deceased, three primary cancers: lung, esophageal, lymphoma.   . Diabetes Sister   . Hypertension Sister   . Cancer Father     gallbladder  . Hyperlipidemia Father   . Hypertension Father   . Cancer Brother     unknown primary  . Cancer Sister     unknown primary  . Hyperlipidemia Sister   . Diabetes Mother   . Hypertension Mother     History   Social History  . Marital Status: Married    Spouse Name: N/A    Number of Children: N/A  . Years of Education: N/A   Social History Main Topics  . Smoking status: Current Every Day Smoker -- 2.00 packs/day for 50 years    Types: Cigarettes  . Smokeless tobacco: Former Systems developer    Quit date: 02/08/2012     Comment: Smokes about 2 packs of tiny skinny cigarettes  . Alcohol Use: No  . Drug Use: No  . Sexual Activity: None   Other Topics  Concern  . None   Social History Narrative  . None    Review of Systems: As mentioned in HPI  Physical Exam: BP 140/68  Pulse 78  Temp(Src) 99 F (37.2 C) (Oral)  Ht 5\' 6"  (1.676 m)  Wt 174 lb 3.2 oz (79.017 kg)  BMI 28.13 kg/m2 General:   Alert and oriented. No distress noted. Pleasant and  cooperative.  Head:  Normocephalic and atraumatic. Eyes:  Conjuctiva clear without scleral icterus. Abdomen:  +BS, soft, non-tender and non-distended. No rebound or guarding. Umbilical hernia appreciated. Query rectus diastasis vs ventral hernia as well.  Msk:  Symmetrical without gross deformities. Normal posture. Extremities:  Right BKA with prosthesis.  Neurologic:  Alert and  oriented x4;  grossly normal neurologically. Skin:  Intact without significant lesions or rashes. Psych:  Alert and cooperative. Normal mood and affect.  Lab Results  Component Value Date   WBC 6.5 10/15/2013   HGB 10.2* 10/15/2013   HCT 32.6* 10/15/2013   MCV 88.3 10/15/2013   PLT 265 10/15/2013   Lab Results  Component Value Date   IRON 18* 09/14/2013   TIBC 293 09/14/2013   FERRITIN 65 10/15/2013

## 2013-12-25 NOTE — Progress Notes (Signed)
cc'd to pcp 

## 2014-01-14 ENCOUNTER — Encounter (HOSPITAL_COMMUNITY): Payer: Self-pay

## 2014-01-14 ENCOUNTER — Encounter (HOSPITAL_COMMUNITY): Payer: Medicaid Other | Attending: Hematology

## 2014-01-14 ENCOUNTER — Encounter (HOSPITAL_COMMUNITY): Payer: Medicaid Other

## 2014-01-14 ENCOUNTER — Encounter (HOSPITAL_BASED_OUTPATIENT_CLINIC_OR_DEPARTMENT_OTHER): Payer: Medicaid Other

## 2014-01-14 VITALS — BP 131/53 | HR 84 | Temp 98.7°F | Resp 20 | Wt 177.0 lb

## 2014-01-14 DIAGNOSIS — J449 Chronic obstructive pulmonary disease, unspecified: Secondary | ICD-10-CM | POA: Insufficient documentation

## 2014-01-14 DIAGNOSIS — D62 Acute posthemorrhagic anemia: Secondary | ICD-10-CM

## 2014-01-14 DIAGNOSIS — Z79899 Other long term (current) drug therapy: Secondary | ICD-10-CM | POA: Insufficient documentation

## 2014-01-14 DIAGNOSIS — I10 Essential (primary) hypertension: Secondary | ICD-10-CM | POA: Insufficient documentation

## 2014-01-14 DIAGNOSIS — E119 Type 2 diabetes mellitus without complications: Secondary | ICD-10-CM | POA: Insufficient documentation

## 2014-01-14 DIAGNOSIS — K5521 Angiodysplasia of colon with hemorrhage: Secondary | ICD-10-CM

## 2014-01-14 DIAGNOSIS — K922 Gastrointestinal hemorrhage, unspecified: Secondary | ICD-10-CM | POA: Diagnosis not present

## 2014-01-14 DIAGNOSIS — D500 Iron deficiency anemia secondary to blood loss (chronic): Secondary | ICD-10-CM

## 2014-01-14 DIAGNOSIS — K219 Gastro-esophageal reflux disease without esophagitis: Secondary | ICD-10-CM | POA: Insufficient documentation

## 2014-01-14 DIAGNOSIS — Q283 Other malformations of cerebral vessels: Secondary | ICD-10-CM | POA: Diagnosis not present

## 2014-01-14 DIAGNOSIS — J4489 Other specified chronic obstructive pulmonary disease: Secondary | ICD-10-CM | POA: Insufficient documentation

## 2014-01-14 LAB — CBC WITH DIFFERENTIAL/PLATELET
Basophils Absolute: 0 10*3/uL (ref 0.0–0.1)
Basophils Relative: 1 % (ref 0–1)
Eosinophils Absolute: 0.5 10*3/uL (ref 0.0–0.7)
Eosinophils Relative: 7 % — ABNORMAL HIGH (ref 0–5)
HEMATOCRIT: 24.1 % — AB (ref 39.0–52.0)
Hemoglobin: 7.2 g/dL — ABNORMAL LOW (ref 13.0–17.0)
LYMPHS ABS: 0.8 10*3/uL (ref 0.7–4.0)
LYMPHS PCT: 12 % (ref 12–46)
MCH: 23.5 pg — ABNORMAL LOW (ref 26.0–34.0)
MCHC: 29.9 g/dL — ABNORMAL LOW (ref 30.0–36.0)
MCV: 78.8 fL (ref 78.0–100.0)
MONO ABS: 0.4 10*3/uL (ref 0.1–1.0)
Monocytes Relative: 5 % (ref 3–12)
NEUTROS ABS: 5 10*3/uL (ref 1.7–7.7)
Neutrophils Relative %: 75 % (ref 43–77)
Platelets: 237 10*3/uL (ref 150–400)
RBC: 3.06 MIL/uL — AB (ref 4.22–5.81)
RDW: 18.3 % — AB (ref 11.5–15.5)
WBC: 6.7 10*3/uL (ref 4.0–10.5)

## 2014-01-14 LAB — FERRITIN: Ferritin: 10 ng/mL — ABNORMAL LOW (ref 22–322)

## 2014-01-14 NOTE — Progress Notes (Signed)
LABS DRAWN FOR [facility],FERR

## 2014-01-14 NOTE — Progress Notes (Signed)
[place] OFFICE PROGRESS NOTE  PCP Redge Gainer, MD [place] [address]  DIAGNOSIS: Iron deficiency anemia due to chronic blood loss - Plan: 0.9 %  sodium chloride infusion, sodium chloride 0.9 % injection 10 mL, heparin lock flush 100 unit/mL, sodium chloride 0.9 % injection 3 mL, Practitioner attestation of consent, Complete patient signature process for consent form, Type and screen, Prepare RBC, Transfuse RBC, acetaminophen (TYLENOL) tablet 650 mg, diphenhydrAMINE (BENADRYL) capsule 25 mg, Type and screen, Prepare RBC, CBC with Differential, Ferritin  Acute blood loss anemia - Plan: 0.9 %  sodium chloride infusion, sodium chloride 0.9 % injection 10 mL, heparin lock flush 100 unit/mL, sodium chloride 0.9 % injection 3 mL, Practitioner attestation of consent, Complete patient signature process for consent form, Type and screen, Prepare RBC, Transfuse RBC, acetaminophen (TYLENOL) tablet 650 mg, diphenhydrAMINE (BENADRYL) capsule 25 mg, Type and screen, Prepare RBC, CBC with Differential, Ferritin  Gastrointestinal hemorrhage, unspecified gastritis, unspecified gastrointestinal hemorrhage type - Plan: 0.9 %  sodium chloride infusion, sodium chloride 0.9 % injection 10 mL, heparin lock flush 100 unit/mL, sodium chloride 0.9 % injection 3 mL, Practitioner attestation of consent, Complete patient signature process for consent form, Type and screen, Prepare RBC, Transfuse RBC, acetaminophen (TYLENOL) tablet 650 mg, diphenhydrAMINE (BENADRYL) capsule 25 mg, Type and screen, Prepare RBC  AVM (arteriovenous malformation) of colon with hemorrhage - Plan: 0.9 %  sodium chloride infusion, sodium chloride 0.9 % injection 10 mL, heparin lock flush 100 unit/mL, sodium chloride 0.9 % injection 3 mL, Practitioner attestation of consent, Complete patient signature process for consent form, Type and screen, Prepare RBC, Transfuse RBC, acetaminophen (TYLENOL)  tablet 650 mg, diphenhydrAMINE (BENADRYL) capsule 25 mg, Type and screen, Prepare RBC  CURRENT THERAPY:  Periodic Feraheme infusions.  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 EGD on file recently with subsequent capsule study showing non-bleeding small intestine and colonic AVMs. Received iron infusion last in April 2015.  Every once in awhile will wake up with nausea. No vomiting. Takes some TUMS, which relieves the nausea. Notes three times in the past month. Good appetite. Sometimes feels full with small amounts of food. Dr. Oneida Alar performed an  EGD on 09/03/13 with no active bleeding encountered. He did have small gastric polyps and only mild non-erosive gastritis. Path results discussed below. Last infusion of Feraheme was more than 3 months ago.  He denies dizziness, dyspnea with ADLs, abdominal pain, melena, or hematochezia. No chest pain, palpatations, edema, fevers, or night sweats.      MEDICAL HISTORY:  Past Medical History  Diagnosis Date  . Diverticulosis 03/04/10  . Peripheral vascular disease, unspecified   . Undiagnosed cardiac murmurs   . Other symptoms involving cardiovascular system   . HLD (hyperlipidemia)   . HTN (hypertension)   . Type II or unspecified type diabetes mellitus without mention of complication, not stated as uncontrolled   . Ulcer   . Carotid artery occlusion   . Umbilical hernia now    has not been repaired  . Blood transfusion   . AVM (arteriovenous malformation) of colon with hemorrhage   . Gastritis   . Internal hemorrhoids   . Right carotid bruit   . Shortness of breath     noted /w low Hgb  . GERD (gastroesophageal reflux disease)   . Chronic kidney disease     renal failure /w osteomyelitis-  2010  . Arthritis     back  . Anemia     8/3,4,5- blood transfusion- [facility], colonoscopy- done & found 2 areas of bleeding   . Tobacco use disorder   .  Anxiety   . CHF (congestive heart failure)     3 yrs ago  . Irregular heart beat     has DIABETES MELLITUS; HYPERLIPIDEMIA; TOBACCO USER; HYPERTENSION; PERIPHERAL VASCULAR DISEASE; SYSTOLIC MURMUR; CAROTID BRUIT, RIGHT; CAD (coronary artery disease); CKD (chronic kidney disease); Amputee, below knee; Diabetic peripheral neuropathy; Anemia; Noncompliance; Occlusion and stenosis of carotid artery without mention of cerebral infarction; Acute blood loss anemia; GI bleeding; Hyponatremia; Acute renal failure; Aftercare following surgery of the circulatory system, NEC; AVM (arteriovenous malformation) of colon with hemorrhage; GERD (gastroesophageal reflux disease); Anxiety; and CHF (congestive heart failure) on his problem list.    ALLERGIES:  is allergic to zocor; hctz; iodine; and sulfa antibiotics.  MEDICATIONS: has a current medication list which includes the following prescription(s): amlodipine, aspirin, atenolol, clonidine, furosemide, glipizide, pantoprazole, polyethylene glycol powder, silver sulfadiazine, and valsartan.  FAMILY HISTORY: family history includes Cancer in his brother, father, and sister; Colon cancer (age of onset: 2) in his brother; Diabetes in his mother and sister; Hyperlipidemia in his brother, father, and sister; Hypertension in his brother, father, mother, and sister; Lung cancer in his sister.  REVIEW OF SYSTEMS:    SINCE YOUR LAST VISIT Been diagnosed or treated for a new medical /surgical  problem or condition: No Any Recent Xrays or studies performed: Yes Any new prescription or OTC medications: No ECOG Perf Status: Ambulatory and capable of all selfcare but unable to carry out any work activities.  Up and about more than 50% of waking hours Problems sleeping: No Medications taken to help sleep: No How is your appetie: 100% normal Any Supplements: No Any trouble chewing or swallowing: No Any Nausea or Vomiting: No Any Bowel problems: No # Bowel Movements  per week: 7 Any Urinary Issues: No Any Cardiac Problems: No Any Respiratory Issues: No Any Neurological Issues: No Do you live alone: No Feelings hopelessness: No Pain Assessment Pain Score: 0-No pain  Other than that discussed above is noncontributory.    PHYSICAL EXAMINATION:   weight is 177 lb (80.287 kg). His oral temperature is 98.7 F (37.1 C). His blood pressure is 131/53 and his pulse is 84. His respiration is 20.    GENERAL: alert, no distress. SKIN: skin color pale, no rashes or significant lesions.   EYES: PERLA; Conjunctiva are plae and non-injected, sclera clear OROPHARYNX: no exudate, no erythema on lips, buccal mucosa, or tongue. NECK: supple, thyroid normal size, non-tender, without nodularity. No masses LYMPH:  no palpable lymphadenopathy in the cervical, axillary or inguinal LUNGS: clear to auscultation and percussion breathing comfortably HEART: regular rate & rhythm and no murmurs.  ABDOMEN: abdomen soft, non-tender and normal bowel sounds,  MUSCULOSKELETAL/EXTREMITIES:  S/P right BKA. Left leg without edema. NEURO: alert & oriented x 3 with fluent speech  LABORATORY DATA:  Office Visit on 01/14/2014  Component Date Value Ref Range Status  . ABO/RH(D) 01/14/2014 O POS   Final  . Antibody Screen 01/14/2014 NEG   Final  . Sample Expiration 01/14/2014 01/17/2014   Final  . Unit Number 01/14/2014 [id number]   Final  . Blood Component Type 01/14/2014 RED CELLS,LR   Final  . Unit division 01/14/2014 00   Final  . Status of Unit 01/14/2014 ALLOCATED   Final  . Transfusion Status 01/14/2014 OK  TO TRANSFUSE   Final  . Crossmatch Result 01/14/2014 Compatible   Final  . Unit Number 01/14/2014 [id number]   Final  . Blood Component Type 01/14/2014 RBC LR PHER2   Final  . Unit division 01/14/2014 00   Final  . Status of Unit 01/14/2014 ALLOCATED   Final  . Transfusion Status 01/14/2014 OK TO TRANSFUSE   Final  . Crossmatch Result 01/14/2014 Compatible    Final  Lab on 01/14/2014  Component Date Value Ref Range Status  . WBC 01/14/2014 6.7  4.0 - 10.5 K/uL Final  . RBC 01/14/2014 3.06* 4.22 - 5.81 MIL/uL Final  . Hemoglobin 01/14/2014 7.2* 13.0 - 17.0 g/dL Final  . HCT 01/14/2014 24.1* 39.0 - 52.0 % Final  . MCV 01/14/2014 78.8  78.0 - 100.0 fL Final  . MCH 01/14/2014 23.5* 26.0 - 34.0 pg Final  . MCHC 01/14/2014 29.9* 30.0 - 36.0 g/dL Final  . RDW 01/14/2014 18.3* 11.5 - 15.5 % Final  . Platelets 01/14/2014 237  150 - 400 K/uL Final  . Neutrophils Relative % 01/14/2014 75  43 - 77 % Final  . Neutro Abs 01/14/2014 5.0  1.7 - 7.7 K/uL Final  . Lymphocytes Relative 01/14/2014 12  12 - 46 % Final  . Lymphs Abs 01/14/2014 0.8  0.7 - 4.0 K/uL Final  . Monocytes Relative 01/14/2014 5  3 - 12 % Final  . Monocytes Absolute 01/14/2014 0.4  0.1 - 1.0 K/uL Final  . Eosinophils Relative 01/14/2014 7* 0 - 5 % Final  . Eosinophils Absolute 01/14/2014 0.5  0.0 - 0.7 K/uL Final  . Basophils Relative 01/14/2014 1  0 - 1 % Final  . Basophils Absolute 01/14/2014 0.0  0.0 - 0.1 K/uL Final  . Ferritin 01/14/2014 10* 22 - 322 ng/mL Final   Performed at [facility]    PATHOLOGY:OSIS Diagnosis Stomach, polyp(s), and gastric biopsies - CHRONIC GASTRITIS AND BENIGN FUNDIC GLAND POLYP. NO HELICOBACTER PYLORI, DYSPLASIA OR MALIGNANCY. - SEE MICROSCOPIC DESCRIPTION. Microscopic Comment Some of the fragments have glandular atrophy; however, no intestinal metaplasia is identified. A Warthin-Starry stain is performed to determine the possibility of the presence of Helicobacter pylori. The Warthin-Starry stain is negative for organisms of Helicobacter pylori. (JDP:ecj 09/04/2013) Claudette Laws MD Pathologist, Electronic Signature (Case signed 09/04/2013) Specimen Gross  RADIOGRAPHIC STUDIES: No results found.  ASSESSMENT:  #1. 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  Anemia is worsening. #2. Inability of oral iron to keep up with chronic blood loss  #3. Chronic obstructive pulmonary disease.  #4. Multiple other comorbidities.   RECOMMENDATIONS:  1. Transfuse 2 units RBCs in outpatient infusion. Followup CBC next week and proceed with more vigorous therapy with Feraheme as dictated by patient's symptoms and CBC results.   The patient knows to call the clinic with any questions or concerns. We can certainly see the patient much sooner if necessary.    Darrall Dears, MD 01/14/2014 6:49 PM

## 2014-01-14 NOTE — Patient Instructions (Signed)
[name] Discharge Instructions  RECOMMENDATIONS MADE BY THE CONSULTANT AND ANY TEST RESULTS WILL BE SENT TO YOUR REFERRING PHYSICIAN.  Return tomorrow as scheduled for blood transfusion. Blood work and office visit in one week as scheduled.  Thank you for choosing [facility] to provide your oncology and hematology care.  To afford each patient quality time with our providers, please arrive at least 15 minutes before your scheduled appointment time.  With your help, our goal is to use those 15 minutes to complete the necessary work-up to ensure our physicians have the information they need to help with your evaluation and healthcare recommendations.    Effective January 1st, 2014, we ask that you re-schedule your appointment with our physicians should you arrive 10 or more minutes late for your appointment.  We strive to give you quality time with our providers, and arriving late affects you and other patients whose appointments are after yours.    Again, thank you for choosing [facility].  Our hope is that these requests will decrease the amount of time that you wait before being seen by our physicians.       _____________________________________________________________  Should you have questions after your visit to [facility], please contact our office at [phone] between the hours of 8:30 a.m. and 4:30 p.m.  Voicemails left after 4:30 p.m. will not be returned until the following business day.  For prescription refill requests, have your pharmacy contact our office with your prescription refill request.    _______________________________________________________________  We hope that we have given you very good care.  You may receive a patient satisfaction survey in the mail, please complete it and return it as soon as possible.  We value your  feedback!  _______________________________________________________________  Have you asked about our STAR program?  STAR stands for Survivorship Training and Rehabilitation, and this is a nationally recognized cancer care program that focuses on survivorship and rehabilitation.  Cancer and cancer treatments may cause problems, such as, pain, making you feel tired and keeping you from doing the things that you need or want to do. Cancer rehabilitation can help. Our goal is to reduce these troubling effects and help you have the best quality of life possible.  You may receive a survey from a nurse that asks questions about your current state of health.  Based on the survey results, all eligible patients will be referred to the [facility] program for an evaluation so we can better serve you!  A frequently asked questions sheet is available upon request. [facility] Discharge Instructions   Thank you for choosing [facility] to provide your oncology and hematology care.  To afford each patient quality time with our providers, please arrive at least 15 minutes before your scheduled appointment time.  With your help, our goal is to use those 15 minutes to complete the necessary work-up to ensure our physicians have the information they need to help with your evaluation and healthcare recommendations.    Effective January 1st, 2014, we ask that you re-schedule your appointment with our physicians should you arrive 10 or more minutes late for your appointment.  We strive to give you quality time with our providers, and arriving late affects you and other patients whose appointments are after yours.    Again, thank you for choosing [facility]  Our hope is that these requests will decrease the amount of  time that you wait before being seen by our physicians.       _____________________________________________________________  Should you have questions after your  visit to [facility], please contact our office at [phone] between the hours of 8:30 a.m. and 4:30 p.m.  Voicemails left after 4:30 p.m. will not be returned until the following business day.  For prescription refill requests, have your pharmacy contact our office with your prescription refill request.    _______________________________________________________________  We hope that we have given you very good care.  You may receive a patient satisfaction survey in the mail, please complete it and return it as soon as possible.  We value your feedback!  _______________________________________________________________  Have you asked about our STAR program?  STAR stands for Survivorship Training and Rehabilitation, and this is a nationally recognized cancer care program that focuses on survivorship and rehabilitation.  Cancer and cancer treatments may cause problems, such as, pain, making you feel tired and keeping you from doing the things that you need or want to do. Cancer rehabilitation can help. Our goal is to reduce these troubling effects and help you have the best quality of life possible.  You may receive a survey from a nurse that asks questions about your current state of health.  Based on the survey results, all eligible patients will be referred to the [facility] program for an evaluation so we can better serve you!  A frequently asked questions sheet is available upon request.

## 2014-01-15 ENCOUNTER — Encounter (HOSPITAL_BASED_OUTPATIENT_CLINIC_OR_DEPARTMENT_OTHER): Payer: Medicaid Other

## 2014-01-15 VITALS — BP 153/67 | HR 73 | Temp 98.0°F | Resp 18 | Wt 175.6 lb

## 2014-01-15 DIAGNOSIS — D62 Acute posthemorrhagic anemia: Secondary | ICD-10-CM | POA: Diagnosis not present

## 2014-01-15 DIAGNOSIS — K922 Gastrointestinal hemorrhage, unspecified: Secondary | ICD-10-CM

## 2014-01-15 DIAGNOSIS — K5521 Angiodysplasia of colon with hemorrhage: Secondary | ICD-10-CM

## 2014-01-15 DIAGNOSIS — D500 Iron deficiency anemia secondary to blood loss (chronic): Secondary | ICD-10-CM

## 2014-01-15 MED ORDER — ACETAMINOPHEN 325 MG PO TABS
ORAL_TABLET | ORAL | Status: AC
  Filled 2014-01-15: qty 2

## 2014-01-15 MED ORDER — DIPHENHYDRAMINE HCL 25 MG PO CAPS
25.0000 mg | ORAL_CAPSULE | Freq: Once | ORAL | Status: AC
  Administered 2014-01-15: 25 mg via ORAL

## 2014-01-15 MED ORDER — SODIUM CHLORIDE 0.9 % IV SOLN
250.0000 mL | Freq: Once | INTRAVENOUS | Status: AC
  Administered 2014-01-15: 09:00:00 via INTRAVENOUS

## 2014-01-15 MED ORDER — DIPHENHYDRAMINE HCL 25 MG PO CAPS
ORAL_CAPSULE | ORAL | Status: AC
  Filled 2014-01-15: qty 1

## 2014-01-15 MED ORDER — ACETAMINOPHEN 325 MG PO TABS
650.0000 mg | ORAL_TABLET | Freq: Once | ORAL | Status: AC
  Administered 2014-01-15: 650 mg via ORAL

## 2014-01-15 NOTE — Progress Notes (Signed)
Patient tolerated 2 units of packed red blood cells without difficulty.  Ate lunch prior to leaving. Walked out of department.

## 2014-01-16 ENCOUNTER — Other Ambulatory Visit: Payer: Self-pay | Admitting: Nurse Practitioner

## 2014-01-16 LAB — TYPE AND SCREEN
ABO/RH(D): O POS
Antibody Screen: NEGATIVE
UNIT DIVISION: 0
Unit division: 0

## 2014-01-21 ENCOUNTER — Encounter (HOSPITAL_BASED_OUTPATIENT_CLINIC_OR_DEPARTMENT_OTHER): Payer: Medicaid Other

## 2014-01-21 ENCOUNTER — Encounter (HOSPITAL_COMMUNITY): Payer: Self-pay

## 2014-01-21 ENCOUNTER — Encounter (HOSPITAL_COMMUNITY): Payer: Medicaid Other | Attending: Hematology and Oncology

## 2014-01-21 VITALS — BP 140/60 | HR 75 | Temp 98.7°F | Resp 20 | Wt 173.0 lb

## 2014-01-21 DIAGNOSIS — I10 Essential (primary) hypertension: Secondary | ICD-10-CM

## 2014-01-21 DIAGNOSIS — Q2733 Arteriovenous malformation of digestive system vessel: Secondary | ICD-10-CM | POA: Diagnosis present

## 2014-01-21 DIAGNOSIS — D500 Iron deficiency anemia secondary to blood loss (chronic): Secondary | ICD-10-CM

## 2014-01-21 DIAGNOSIS — D62 Acute posthemorrhagic anemia: Secondary | ICD-10-CM | POA: Insufficient documentation

## 2014-01-21 DIAGNOSIS — J449 Chronic obstructive pulmonary disease, unspecified: Secondary | ICD-10-CM

## 2014-01-21 LAB — CBC WITH DIFFERENTIAL/PLATELET
BASOS ABS: 0 10*3/uL (ref 0.0–0.1)
BASOS PCT: 1 % (ref 0–1)
EOS PCT: 7 % — AB (ref 0–5)
Eosinophils Absolute: 0.5 10*3/uL (ref 0.0–0.7)
HCT: 32.6 % — ABNORMAL LOW (ref 39.0–52.0)
Hemoglobin: 9.8 g/dL — ABNORMAL LOW (ref 13.0–17.0)
Lymphocytes Relative: 14 % (ref 12–46)
Lymphs Abs: 0.9 10*3/uL (ref 0.7–4.0)
MCH: 24.1 pg — AB (ref 26.0–34.0)
MCHC: 30.1 g/dL (ref 30.0–36.0)
MCV: 80.1 fL (ref 78.0–100.0)
Monocytes Absolute: 0.4 10*3/uL (ref 0.1–1.0)
Monocytes Relative: 6 % (ref 3–12)
NEUTROS PCT: 72 % (ref 43–77)
Neutro Abs: 4.6 10*3/uL (ref 1.7–7.7)
Platelets: 232 10*3/uL (ref 150–400)
RBC: 4.07 MIL/uL — ABNORMAL LOW (ref 4.22–5.81)
RDW: 18.2 % — AB (ref 11.5–15.5)
WBC: 6.4 10*3/uL (ref 4.0–10.5)

## 2014-01-21 LAB — FERRITIN: FERRITIN: 14 ng/mL — AB (ref 22–322)

## 2014-01-21 NOTE — Progress Notes (Signed)
[facility]  OFFICE PROGRESS NOTE  Redge Gainer, MD [place] [address]  DIAGNOSIS: No diagnosis found.  No chief complaint on file.   CURRENT THERAPY: Transfusion 2 units packed red blood cells on 01/15/2014. Feraheme intravenously on 09/17/2013  INTERVAL HISTORY: Jais Demir Thome 63 y.o. 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  He does feel more energetic after transfusion last week. Appetite is good with no nausea, vomiting, abdominal pain, melena, hematochezia, hematuria, epistaxis, or mop this is. He denies any nausea, vomiting, diarrhea, constipation, incontinence, lower extremity swelling or redness, skin rash, headache, seizures, or craving for ice.  MEDICAL HISTORY: Past Medical History  Diagnosis Date  . Diverticulosis 03/04/10  . Peripheral vascular disease, unspecified   . Undiagnosed cardiac murmurs   . Other symptoms involving cardiovascular system   . HLD (hyperlipidemia)   . HTN (hypertension)   . Type II or unspecified type diabetes mellitus without mention of complication, not stated as uncontrolled   . Ulcer   . Carotid artery occlusion   . Umbilical hernia now    has not been repaired  . Blood transfusion   . AVM (arteriovenous malformation) of colon with hemorrhage   . Gastritis   . Internal hemorrhoids   . Right carotid bruit   . Shortness of breath     noted /w low Hgb  . GERD (gastroesophageal reflux disease)   . Chronic kidney disease     renal failure /w osteomyelitis- 2010  . Arthritis     back  . Anemia     8/3,4,5- blood transfusion- [facility], colonoscopy- done & found 2 areas of bleeding   . Tobacco use disorder   . Anxiety   . CHF (congestive heart failure)     3 yrs ago  . Irregular heart beat     INTERIM  HISTORY: has DIABETES MELLITUS; HYPERLIPIDEMIA; TOBACCO USER; HYPERTENSION; PERIPHERAL VASCULAR DISEASE; SYSTOLIC MURMUR; CAROTID BRUIT, RIGHT; CAD (coronary artery disease); CKD (chronic kidney disease); Amputee, below knee; Diabetic peripheral neuropathy; Anemia; Noncompliance; Occlusion and stenosis of carotid artery without mention of cerebral infarction; Acute blood loss anemia; GI bleeding; Hyponatremia; Acute renal failure; Aftercare following surgery of the circulatory system, [facility]; AVM (arteriovenous malformation) of colon with hemorrhage; GERD (gastroesophageal reflux disease); Anxiety; and CHF (congestive heart failure) on his problem list.    ALLERGIES:  is allergic to zocor; hctz; iodine; and sulfa antibiotics.  MEDICATIONS: has a current medication list which includes the following prescription(s): amlodipine, aspirin, atenolol, atenolol, clonidine, furosemide, glipizide, pantoprazole, polyethylene glycol powder, silver sulfadiazine, and valsartan.  SURGICAL HISTORY:  Past Surgical History  Procedure Laterality Date  . Colonoscopy  03/04/2010    Dr. Princella Pellegrini AMV without mention of ablation, scattered diverticula, internal hemorrhoids  . Bilateral common superficial femoral and profudus artery endarterectomies      2003    Dr. Sherren Mocha Early  . Esophagogastroduodenoscopy  03/2000    Dr. Tharon Aquas gastritis, H.Pylori gastritis, ?treated  . Esophagogastroduodenoscopy  05/2006    Dr. Marcello Fennel recurrent GI bleed. antral gastritis, single gastric AVM s/p APC, CLO results?  . Colonoscopy  11/2004    Dr. Barkley Bruns  . 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 Colonoscopy  01/22/2012    NUR: Two cecal AV malformations  without stigmata of bleed with maximal.meter of 8-10 mm. Both of these are ablated with argon plasma coagulator./ Small external hemorrhoids  . Esophagogastroduodenoscopy  01/21/2012    SLF: MILD TO MAODERATE Gastritis/ SLOW  GIB LIKEY DUE TO PT BEING ON ASA ND SUBSEQUENT BLOOD/ LOSS FROM AVMS IN STOMACH AND COLON AS WELL AS GASTRITIS  . Enteroscopy  01/21/2012    Procedure: ENTEROSCOPY;  Surgeon: Danie Binder, MD;  Location: [facility];  Service: Endoscopy;;  . No past surgeries    . Vascular surgery      rt bka and rt 1st toe amputation  . Below knee leg amputation      2010, wears prosthesis   . Endarterectomy  02/09/2012    Procedure: ENDARTERECTOMY CAROTID;  Surgeon: Rosetta Posner, MD;  Location: [facility];  Service: Vascular;  Laterality: Left;  . Carotid endarterectomy Left 02-09-12  . Esophagogastroduodenoscopy N/A 09/03/2013    Mild chronic gastritis. benign gastric polyps  . Givens capsule study N/A 09/03/2013    Dr. Oneida Alar: small bowel and colonic AVMs. no active bleeding    FAMILY HISTORY: family history includes Cancer in his brother, father, and sister; Colon cancer (age of onset: 22) in his brother; Diabetes in his mother and sister; Hyperlipidemia in his brother, father, and sister; Hypertension in his brother, father, mother, and sister; Lung cancer in his sister.  SOCIAL HISTORY:  reports that he has been smoking Cigarettes.  He has a 100 pack-year smoking history. He quit smokeless tobacco use about 1 years ago. He reports that he does not drink alcohol or use illicit drugs.  REVIEW OF SYSTEMS:  Other than that discussed above is noncontributory.  PHYSICAL EXAMINATION: ECOG PERFORMANCE STATUS: 1 - Symptomatic but completely ambulatory  Blood pressure 140/60, pulse 75, temperature 98.7 F (37.1 C), temperature source Oral, resp. rate 20, weight 173 lb (78.472 kg).  GENERAL:alert, no distress and comfortable SKIN: skin color, texture, turgor are normal, no rashes or significant lesions EYES: PERLA; Conjunctiva are pink and non-injected, sclera clear SINUSES: No redness or tenderness over maxillary or ethmoid sinuses OROPHARYNX:no exudate, no erythema on lips, buccal mucosa, or tongue. NECK:  supple, thyroid normal size, non-tender, without nodularity. No masses CHEST: Increased AP diameter with no gynecomastia. LYMPH:  no palpable lymphadenopathy in the cervical, axillary or inguinal LUNGS: clear to auscultation and percussion with normal breathing effort HEART: regular rate & rhythm and no murmurs. Occasional premature beat. ABDOMEN:abdomen soft, non-tender and normal bowel sounds MUSCULOSKELETAL:no cyanosis of digits and no clubbing. Range of motion normal. Right AKA with prosthesis in place. NEURO: alert & oriented x 3 with fluent speech, no focal motor/sensory deficits   LABORATORY DATA:  Results for BRYLON, BRENNING (MRN [id number]) as of 01/21/2014 09:32  Ref. Range 08/01/2013 18:46 09/14/2013 14:30 10/15/2013 10:13 01/14/2014 08:59  Ferritin Latest Range: 22-322 ng/mL 13 (L) 14 (L) 65 10 (L)     Lab on 01/21/2014  Component Date Value Ref Range Status  . WBC 01/21/2014 6.4  4.0 - 10.5 K/uL Final  . RBC 01/21/2014 4.07* 4.22 - 5.81 MIL/uL Final  . Hemoglobin 01/21/2014 9.8* 13.0 - 17.0 g/dL Final  . HCT 01/21/2014 32.6* 39.0 - 52.0 % Final  . MCV 01/21/2014 80.1  78.0 - 100.0 fL Final  . MCH 01/21/2014 24.1* 26.0 - 34.0 pg Final  . MCHC 01/21/2014 30.1  30.0 - 36.0 g/dL Final  . RDW 01/21/2014 18.2* 11.5 - 15.5 % Final  . Platelets 01/21/2014 232  150 - 400 K/uL Final  . Neutrophils Relative % 01/21/2014 72  43 - 77 % Final  . Neutro Abs 01/21/2014 4.6  1.7 - 7.7 K/uL Final  . Lymphocytes Relative 01/21/2014 14  12 - 46 % Final  . Lymphs Abs 01/21/2014 0.9  0.7 - 4.0 K/uL Final  . Monocytes Relative 01/21/2014 6  3 - 12 % Final  . Monocytes Absolute 01/21/2014 0.4  0.1 - 1.0 K/uL Final  . Eosinophils Relative 01/21/2014 7* 0 - 5 % Final  . Eosinophils Absolute 01/21/2014 0.5  0.0 - 0.7 K/uL Final  . Basophils Relative 01/21/2014 1  0 - 1 % Final  . Basophils Absolute 01/21/2014 0.0  0.0 - 0.1 K/uL Final  Office Visit on 01/14/2014  Component Date Value Ref Range  Status  . ABO/RH(D) 01/14/2014 O POS   Final  . Antibody Screen 01/14/2014 NEG   Final  . Sample Expiration 01/14/2014 01/17/2014   Final  . Unit Number 01/14/2014 [id number]   Final  . Blood Component Type 01/14/2014 RED CELLS,LR   Final  . Unit division 01/14/2014 00   Final  . Status of Unit 01/14/2014 ISSUED,FINAL   Final  . Transfusion Status 01/14/2014 OK TO TRANSFUSE   Final  . Crossmatch Result 01/14/2014 Compatible   Final  . Unit Number 01/14/2014 [id number]   Final  . Blood Component Type 01/14/2014 RBC LR PHER2   Final  . Unit division 01/14/2014 00   Final  . Status of Unit 01/14/2014 ISSUED,FINAL   Final  . Transfusion Status 01/14/2014 OK TO TRANSFUSE   Final  . Crossmatch Result 01/14/2014 Compatible   Final  Lab on 01/14/2014  Component Date Value Ref Range Status  . WBC 01/14/2014 6.7  4.0 - 10.5 K/uL Final  . RBC 01/14/2014 3.06* 4.22 - 5.81 MIL/uL Final  . Hemoglobin 01/14/2014 7.2* 13.0 - 17.0 g/dL Final  . HCT 01/14/2014 24.1* 39.0 - 52.0 % Final  . MCV 01/14/2014 78.8  78.0 - 100.0 fL Final  . MCH 01/14/2014 23.5* 26.0 - 34.0 pg Final  . MCHC 01/14/2014 29.9* 30.0 - 36.0 g/dL Final  . RDW 01/14/2014 18.3* 11.5 - 15.5 % Final  . Platelets 01/14/2014 237  150 - 400 K/uL Final  . Neutrophils Relative % 01/14/2014 75  43 - 77 % Final  . Neutro Abs 01/14/2014 5.0  1.7 - 7.7 K/uL Final  . Lymphocytes Relative 01/14/2014 12  12 - 46 % Final  . Lymphs Abs 01/14/2014 0.8  0.7 - 4.0 K/uL Final  . Monocytes Relative 01/14/2014 5  3 - 12 % Final  . Monocytes Absolute 01/14/2014 0.4  0.1 - 1.0 K/uL Final  . Eosinophils Relative 01/14/2014 7* 0 - 5 % Final  . Eosinophils Absolute 01/14/2014 0.5  0.0 - 0.7 K/uL Final  . Basophils Relative 01/14/2014 1  0 - 1 % Final  . Basophils Absolute 01/14/2014 0.0  0.0 - 0.1 K/uL Final  . Ferritin 01/14/2014 10* 22 - 322 ng/mL Final   Performed at [facility]: No new pathology.  Urinalysis      Component Value Date/Time   COLORURINE YELLOW 02/03/2012 1140   APPEARANCEUR CLEAR 02/03/2012 1140   LABSPEC 1.011 02/03/2012 1140   PHURINE 6.5 02/03/2012 1140   GLUCOSEU NEGATIVE 02/03/2012 1140   HGBUR NEGATIVE 02/03/2012 [place] 02/03/2012 1140   KETONESUR NEGATIVE 02/03/2012 1140   PROTEINUR >300* 02/03/2012 1140   UROBILINOGEN 0.2  02/03/2012 1140   NITRITE NEGATIVE 02/03/2012 1140   LEUKOCYTESUR NEGATIVE 02/03/2012 1140    RADIOGRAPHIC STUDIES: No results found.  ASSESSMENT:  #1. 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  #2. Inability of oral iron to keep up with chronic blood loss perhaps due to malabsorption from use of proton pump inhibitors.  #3. Chronic obstructive pulmonary disease.  #4. Diabetes mellitus, type II, non-insulin requiring.  #5. Gastroesophageal reflux disease, on proton pump inhibitor.  #6. Hypertension, controlled    PLAN:  #1. Intravenous Feraheme on 01/22/2014 and 01/29/2014. #2. CBC and ferritin in 4 weeks. #3. Office visit with CBC and ferritin in 8 weeks.   All questions were answered. The patient knows to call the clinic with any problems, questions or concerns. We can certainly see the patient much sooner if necessary.   I spent 25 minutes counseling the patient face to face. The total time spent in the appointment was 30 minutes.    Doroteo Bradford, MD 01/21/2014 10:21 AM  DISCLAIMER:  This note was dictated with voice recognition software.  Similar sounding words can inadvertently be transcribed inaccurately and may not be corrected upon review.

## 2014-01-21 NOTE — Patient Instructions (Signed)
[name] Discharge Instructions  RECOMMENDATIONS MADE BY THE CONSULTANT AND ANY TEST RESULTS WILL BE SENT TO YOUR REFERRING PHYSICIAN.  EXAM FINDINGS BY THE PHYSICIAN TODAY AND SIGNS OR SYMPTOMS TO REPORT TO CLINIC OR PRIMARY PHYSICIAN: Exam and findings as discussed by Dr.Formanek.  MEDICATIONS PRESCRIBED:  Continue all as prescribed.  INSTRUCTIONS/FOLLOW-UP: Iron infusion next Tuesday 8/11 and 1 week later 8/18. Lab work 1 month after second infusion. Lab work and MD appointment 2 months after second infusion. Report any issues/concerns to clinic as needed.  Thank you for choosing [facility] to provide your oncology and hematology care.  To afford each patient quality time with our providers, please arrive at least 15 minutes before your scheduled appointment time.  With your help, our goal is to use those 15 minutes to complete the necessary work-up to ensure our physicians have the information they need to help with your evaluation and healthcare recommendations.    Effective January 1st, 2014, we ask that you re-schedule your appointment with our physicians should you arrive 10 or more minutes late for your appointment.  We strive to give you quality time with our providers, and arriving late affects you and other patients whose appointments are after yours.    Again, thank you for choosing [facility].  Our hope is that these requests will decrease the amount of time that you wait before being seen by our physicians.       _____________________________________________________________  Should you have questions after your visit to [facility], please contact our office at [phone] between the hours of 8:30 a.m. and 4:30 p.m.  Voicemails left after 4:30 p.m. will not be returned until the following business day.  For prescription refill requests, have your pharmacy contact our office with your prescription refill  request.    _______________________________________________________________  We hope that we have given you very good care.  You may receive a patient satisfaction survey in the mail, please complete it and return it as soon as possible.  We value your feedback!  _______________________________________________________________  Have you asked about our STAR program?  STAR stands for Survivorship Training and Rehabilitation, and this is a nationally recognized cancer care program that focuses on survivorship and rehabilitation.  Cancer and cancer treatments may cause problems, such as, pain, making you feel tired and keeping you from doing the things that you need or want to do. Cancer rehabilitation can help. Our goal is to reduce these troubling effects and help you have the best quality of life possible.  You may receive a survey from a nurse that asks questions about your current state of health.  Based on the survey results, all eligible patients will be referred to the [facility] program for an evaluation so we can better serve you!  A frequently asked questions sheet is available upon request.

## 2014-01-21 NOTE — Progress Notes (Signed)
Labs drawn for cbcd,ferr.

## 2014-01-29 ENCOUNTER — Encounter (HOSPITAL_BASED_OUTPATIENT_CLINIC_OR_DEPARTMENT_OTHER): Payer: Medicaid Other

## 2014-01-29 VITALS — BP 155/61 | HR 72 | Temp 98.0°F | Resp 20 | Wt 175.0 lb

## 2014-01-29 DIAGNOSIS — K922 Gastrointestinal hemorrhage, unspecified: Secondary | ICD-10-CM

## 2014-01-29 DIAGNOSIS — K5521 Angiodysplasia of colon with hemorrhage: Secondary | ICD-10-CM

## 2014-01-29 DIAGNOSIS — D500 Iron deficiency anemia secondary to blood loss (chronic): Secondary | ICD-10-CM

## 2014-01-29 DIAGNOSIS — D62 Acute posthemorrhagic anemia: Secondary | ICD-10-CM

## 2014-01-29 MED ORDER — SODIUM CHLORIDE 0.9 % IV SOLN
INTRAVENOUS | Status: DC
  Administered 2014-01-29: 11:00:00 via INTRAVENOUS

## 2014-01-29 MED ORDER — SODIUM CHLORIDE 0.9 % IV SOLN
510.0000 mg | Freq: Once | INTRAVENOUS | Status: AC
  Administered 2014-01-29: 510 mg via INTRAVENOUS
  Filled 2014-01-29: qty 17

## 2014-01-29 NOTE — Progress Notes (Signed)
Patient tolerated infusion well.

## 2014-01-30 ENCOUNTER — Encounter: Payer: Medicaid Other | Admitting: Nurse Practitioner

## 2014-01-30 NOTE — Progress Notes (Signed)
   Subjective:    Patient ID: [name], male    DOB: May 15, 1951, 63 y.o.   MRN: [id number]  HPI    Review of Systems     Objective:   Physical Exam        Assessment & Plan:  Erroneous

## 2014-02-04 ENCOUNTER — Other Ambulatory Visit: Payer: Self-pay | Admitting: Nurse Practitioner

## 2014-02-05 ENCOUNTER — Other Ambulatory Visit (HOSPITAL_COMMUNITY): Payer: Medicaid Other

## 2014-02-05 ENCOUNTER — Encounter (HOSPITAL_BASED_OUTPATIENT_CLINIC_OR_DEPARTMENT_OTHER): Payer: Medicaid Other

## 2014-02-05 VITALS — BP 145/52 | HR 70 | Temp 98.4°F | Resp 20

## 2014-02-05 DIAGNOSIS — K922 Gastrointestinal hemorrhage, unspecified: Secondary | ICD-10-CM

## 2014-02-05 DIAGNOSIS — D500 Iron deficiency anemia secondary to blood loss (chronic): Secondary | ICD-10-CM

## 2014-02-05 MED ORDER — SODIUM CHLORIDE 0.9 % IV SOLN
510.0000 mg | Freq: Once | INTRAVENOUS | Status: AC
  Administered 2014-02-05: 510 mg via INTRAVENOUS
  Filled 2014-02-05: qty 17

## 2014-02-14 ENCOUNTER — Other Ambulatory Visit: Payer: Self-pay | Admitting: Nurse Practitioner

## 2014-02-16 ENCOUNTER — Other Ambulatory Visit: Payer: Self-pay | Admitting: Nurse Practitioner

## 2014-02-19 NOTE — Telephone Encounter (Signed)
no more refills without being seen  

## 2014-02-27 ENCOUNTER — Encounter: Payer: Self-pay | Admitting: Gastroenterology

## 2014-03-01 ENCOUNTER — Other Ambulatory Visit (HOSPITAL_COMMUNITY): Payer: Self-pay

## 2014-03-01 DIAGNOSIS — D649 Anemia, unspecified: Secondary | ICD-10-CM

## 2014-03-05 ENCOUNTER — Ambulatory Visit (HOSPITAL_COMMUNITY): Payer: Medicaid Other

## 2014-03-05 ENCOUNTER — Encounter (HOSPITAL_COMMUNITY): Payer: Medicaid Other | Attending: Hematology and Oncology

## 2014-03-05 DIAGNOSIS — D649 Anemia, unspecified: Secondary | ICD-10-CM

## 2014-03-05 DIAGNOSIS — D62 Acute posthemorrhagic anemia: Secondary | ICD-10-CM | POA: Insufficient documentation

## 2014-03-05 DIAGNOSIS — Q2733 Arteriovenous malformation of digestive system vessel: Secondary | ICD-10-CM | POA: Insufficient documentation

## 2014-03-05 DIAGNOSIS — D500 Iron deficiency anemia secondary to blood loss (chronic): Secondary | ICD-10-CM | POA: Insufficient documentation

## 2014-03-05 LAB — CBC
HEMATOCRIT: 38.9 % — AB (ref 39.0–52.0)
Hemoglobin: 12.9 g/dL — ABNORMAL LOW (ref 13.0–17.0)
MCH: 28.4 pg (ref 26.0–34.0)
MCHC: 33.2 g/dL (ref 30.0–36.0)
MCV: 85.5 fL (ref 78.0–100.0)
PLATELETS: 162 10*3/uL (ref 150–400)
RBC: 4.55 MIL/uL (ref 4.22–5.81)
RDW: 22.4 % — AB (ref 11.5–15.5)
WBC: 6.4 10*3/uL (ref 4.0–10.5)

## 2014-03-05 LAB — FERRITIN: FERRITIN: 105 ng/mL (ref 22–322)

## 2014-03-05 NOTE — Progress Notes (Signed)
LABS FOR CBC,FERR

## 2014-03-12 ENCOUNTER — Other Ambulatory Visit: Payer: Self-pay | Admitting: Nurse Practitioner

## 2014-03-14 ENCOUNTER — Encounter: Payer: Self-pay | Admitting: Nurse Practitioner

## 2014-03-14 ENCOUNTER — Ambulatory Visit (INDEPENDENT_AMBULATORY_CARE_PROVIDER_SITE_OTHER): Payer: Medicaid Other | Admitting: Nurse Practitioner

## 2014-03-14 VITALS — BP 176/91 | HR 82 | Temp 97.3°F | Ht 66.0 in | Wt 173.0 lb

## 2014-03-14 DIAGNOSIS — E871 Hypo-osmolality and hyponatremia: Secondary | ICD-10-CM

## 2014-03-14 DIAGNOSIS — R011 Cardiac murmur, unspecified: Secondary | ICD-10-CM

## 2014-03-14 DIAGNOSIS — I2510 Atherosclerotic heart disease of native coronary artery without angina pectoris: Secondary | ICD-10-CM

## 2014-03-14 DIAGNOSIS — I509 Heart failure, unspecified: Secondary | ICD-10-CM

## 2014-03-14 DIAGNOSIS — K219 Gastro-esophageal reflux disease without esophagitis: Secondary | ICD-10-CM

## 2014-03-14 DIAGNOSIS — E785 Hyperlipidemia, unspecified: Secondary | ICD-10-CM

## 2014-03-14 DIAGNOSIS — E1149 Type 2 diabetes mellitus with other diabetic neurological complication: Secondary | ICD-10-CM

## 2014-03-14 DIAGNOSIS — F411 Generalized anxiety disorder: Secondary | ICD-10-CM

## 2014-03-14 DIAGNOSIS — I10 Essential (primary) hypertension: Secondary | ICD-10-CM

## 2014-03-14 DIAGNOSIS — F419 Anxiety disorder, unspecified: Secondary | ICD-10-CM

## 2014-03-14 DIAGNOSIS — D500 Iron deficiency anemia secondary to blood loss (chronic): Secondary | ICD-10-CM

## 2014-03-14 DIAGNOSIS — E1142 Type 2 diabetes mellitus with diabetic polyneuropathy: Secondary | ICD-10-CM

## 2014-03-14 DIAGNOSIS — I5032 Chronic diastolic (congestive) heart failure: Secondary | ICD-10-CM

## 2014-03-14 DIAGNOSIS — E119 Type 2 diabetes mellitus without complications: Secondary | ICD-10-CM

## 2014-03-14 LAB — POCT GLYCOSYLATED HEMOGLOBIN (HGB A1C): HEMOGLOBIN A1C: 5.4

## 2014-03-14 MED ORDER — SILVER SULFADIAZINE 1 % EX CREA: TOPICAL_CREAM | CUTANEOUS | Status: AC

## 2014-03-14 NOTE — Patient Instructions (Signed)

## 2014-03-14 NOTE — Progress Notes (Signed)
Subjective:    Patient ID: [name], male    DOB: August 01, 1950, 63 y.o.   MRN: [id number]  HPI Patient in today for follow up of chronic medical problems- He is doing well right now. Sees Dr. And watches his hgb and says that he has bleeding past the duodenum. He gets hgb checked every 2 months. Has iron infusions every other month. He has no complaints today. Patient Active Problem List   Diagnosis Date Noted  . AVM (arteriovenous malformation) of colon with hemorrhage   . GERD (gastroesophageal reflux disease)   . Anxiety   . CHF (congestive heart failure)   . Aftercare following surgery of the circulatory system, [place] 03/07/2013  . GI bleeding 01/20/2012  . Hyponatremia 01/20/2012  . Acute renal failure 01/20/2012  . Occlusion and stenosis of carotid artery without mention of cerebral infarction 01/11/2012  . CAD (coronary artery disease) 10/07/2010  . CKD (chronic kidney disease) 10/07/2010  . Amputee, below knee 10/07/2010  . Diabetic peripheral neuropathy 10/07/2010  . Anemia 10/07/2010  . Noncompliance 10/07/2010  . Type II or unspecified type diabetes mellitus without mention of complication, not stated as uncontrolled 11/20/2007  . HYPERLIPIDEMIA 11/20/2007  . TOBACCO USER 11/20/2007  . HYPERTENSION 11/20/2007  . PERIPHERAL VASCULAR DISEASE 11/20/2007  . SYSTOLIC MURMUR 20/25/4270  . CAROTID BRUIT, RIGHT 11/20/2007   Outpatient Encounter Prescriptions as of 03/14/2014  Medication Sig  . amLODipine (NORVASC) 10 MG tablet TAKE 1 TABLET (10 MG TOTAL) BY MOUTH DAILY BEFORE BREAKFAST.  Marland Kitchen aspirin 81 MG tablet Take 81 mg by mouth daily.  Marland Kitchen atenolol (TENORMIN) 100 MG tablet TAKE 1 TABLET (100 MG TOTAL) BY MOUTH DAILY.  Marland Kitchen atenolol (TENORMIN) 50 MG tablet Take 50 mg by mouth 2 (two) times daily.  . cloNIDine (CATAPRES) 0.1 MG tablet Take 1 tablet (0.1 mg total) by mouth 2 (two) times daily.  . furosemide (LASIX) 40 MG tablet Take 1 tablet (40 mg total) by mouth as needed.    Marland Kitchen glipiZIDE (GLUCOTROL) 10 MG tablet Take 15 mg by mouth daily. Takes one tablet in the Am and 1/2 tablet in the pm  . pantoprazole (PROTONIX) 40 MG tablet TAKE 1 TABLET BY MOUTH TWICE A DAY BEFORE MEAL  . polyethylene glycol powder (GLYCOLAX/MIRALAX) powder Take 1 Container by mouth once. Only as needed  . silver sulfADIAZINE (SILVADENE) 1 % cream Apply 1 application topically 2 (two) times daily.  . silver sulfADIAZINE (SILVADENE) 1 % cream APPLY TO AFFECTED AREA TWICE A DAY  . valsartan (DIOVAN) 320 MG tablet TAKE 1 TABLET (320 MG TOTAL) BY MOUTH DAILY.  . [DISCONTINUED] pantoprazole (PROTONIX) 20 MG tablet Take 20 mg by mouth 2 (two) times daily before a meal.  . [DISCONTINUED] valsartan (DIOVAN) 320 MG tablet Take 320 mg by mouth daily.       Review of Systems  Constitutional: Negative.   HENT: Negative.   Respiratory: Negative.   Cardiovascular: Negative.   Genitourinary: Negative.   Neurological: Negative.   Psychiatric/Behavioral: Negative.   All other systems reviewed and are negative.      Objective:   Physical Exam  Constitutional: He is oriented to person, place, and time. He appears well-developed and well-nourished.  HENT:  Head: Normocephalic.  Right Ear: External ear normal.  Left Ear: External ear normal.  Nose: Nose normal.  Mouth/Throat: Oropharynx is clear and moist.  Eyes: EOM are normal. Pupils are equal, round, and reactive to light.  Neck: Normal range of motion. Neck  supple. No JVD present. No thyromegaly present.  Cardiovascular: Normal rate, regular rhythm, normal heart sounds and intact distal pulses.  Exam reveals no gallop and no friction rub.   No murmur heard. Pulmonary/Chest: Effort normal and breath sounds normal. No respiratory distress. He has no wheezes. He has no rales. He exhibits no tenderness.  Abdominal: Soft. Bowel sounds are normal. He exhibits no mass. There is no tenderness.  Umbilical hernia  Genitourinary: Prostate normal.  Penile tenderness present.  Musculoskeletal: Normal range of motion. He exhibits no edema.  Right below knee amputation with prosthesis.  Lymphadenopathy:    He has no cervical adenopathy.  Neurological: He is alert and oriented to person, place, and time. No cranial nerve deficit.  Skin: Skin is warm and dry.  Psychiatric: He has a normal mood and affect. His behavior is normal. Judgment and thought content normal.   BP 176/91  Pulse 82  Temp(Src) 97.3 F (36.3 C) (Oral)  Ht 5' 6" (1.676 m)  Wt 173 lb (78.472 kg)  BMI 27.94 kg/m2   Results for orders placed in visit on 03/14/14  POCT GLYCOSYLATED HEMOGLOBIN (HGB A1C)      Result Value Ref Range   Hemoglobin A1C 5.4          Assessment & Plan:   1. DIABETES MELLITUS   2. HYPERLIPIDEMIA   3. HYPERTENSION   4. SYSTOLIC MURMUR   5. Hyponatremia   6. Gastroesophageal reflux disease without esophagitis   7. Diabetic peripheral neuropathy   8. Chronic diastolic congestive heart failure   9. Coronary artery disease involving native coronary artery of native heart without angina pectoris   10. Anxiety   11. Iron deficiency anemia due to chronic blood loss    Orders Placed This Encounter  Procedures  . CMP14+EGFR  . NMR, lipoprofile  . POCT glycosylated hemoglobin (Hb A1C)   Continue all meds Keep appointments with specialists Labs pending Health maintenance reviewed STOP SMOKING Follow up in 3 months  [place], FNP

## 2014-03-15 LAB — NMR, LIPOPROFILE
Cholesterol: 218 mg/dL — ABNORMAL HIGH (ref 100–199)
HDL CHOLESTEROL BY NMR: 33 mg/dL — AB (ref >39)
HDL PARTICLE NUMBER: 21.2 umol/L — AB (ref >=30.5)
LDL Particle Number: 803 nmol/L (ref <1000)
LDLC SERPL CALC-MCNC: 128 mg/dL — ABNORMAL HIGH (ref 0–99)
LP-IR Score: 72 — ABNORMAL HIGH (ref <=45)
Triglycerides by NMR: 286 mg/dL — ABNORMAL HIGH (ref 0–149)

## 2014-03-15 LAB — CMP14+EGFR
A/G RATIO: 1.5 (ref 1.1–2.5)
ALT: 13 IU/L (ref 0–44)
AST: 13 IU/L (ref 0–40)
Albumin: 3.5 g/dL — ABNORMAL LOW (ref 3.6–4.8)
Alkaline Phosphatase: 83 IU/L (ref 39–117)
BUN/Creatinine Ratio: 9 — ABNORMAL LOW (ref 10–22)
BUN: 20 mg/dL (ref 8–27)
CALCIUM: 9 mg/dL (ref 8.6–10.2)
CO2: 23 mmol/L (ref 18–29)
Chloride: 103 mmol/L (ref 97–108)
Creatinine, Ser: 2.15 mg/dL — ABNORMAL HIGH (ref 0.76–1.27)
GFR calc Af Amer: 37 mL/min/{1.73_m2} — ABNORMAL LOW (ref >59)
GFR calc non Af Amer: 32 mL/min/{1.73_m2} — ABNORMAL LOW (ref >59)
GLOBULIN, TOTAL: 2.4 g/dL (ref 1.5–4.5)
GLUCOSE: 105 mg/dL — AB (ref 65–99)
POTASSIUM: 4.2 mmol/L (ref 3.5–5.2)
Sodium: 140 mmol/L (ref 134–144)
Total Protein: 5.9 g/dL — ABNORMAL LOW (ref 6.0–8.5)

## 2014-03-19 ENCOUNTER — Other Ambulatory Visit: Payer: Self-pay | Admitting: Nurse Practitioner

## 2014-03-21 ENCOUNTER — Other Ambulatory Visit: Payer: Self-pay | Admitting: Family Medicine

## 2014-03-21 ENCOUNTER — Telehealth: Payer: Self-pay | Admitting: Family Medicine

## 2014-03-21 NOTE — Telephone Encounter (Signed)
This patient may need to be seen for pain medication treatment. Please make an appointment for him to be seen if necessary

## 2014-03-21 NOTE — Telephone Encounter (Signed)
Pt notified will need to be seen appt scheduled

## 2014-03-22 ENCOUNTER — Encounter: Payer: Self-pay | Admitting: Family

## 2014-03-22 ENCOUNTER — Ambulatory Visit (INDEPENDENT_AMBULATORY_CARE_PROVIDER_SITE_OTHER): Payer: Medicaid Other | Admitting: Family

## 2014-03-22 VITALS — BP 135/65 | HR 75 | Temp 97.9°F | Ht 66.0 in | Wt 171.0 lb

## 2014-03-22 DIAGNOSIS — E1342 Other specified diabetes mellitus with diabetic polyneuropathy: Secondary | ICD-10-CM

## 2014-03-22 DIAGNOSIS — I739 Peripheral vascular disease, unspecified: Secondary | ICD-10-CM

## 2014-03-22 DIAGNOSIS — E1142 Type 2 diabetes mellitus with diabetic polyneuropathy: Secondary | ICD-10-CM

## 2014-03-22 DIAGNOSIS — G629 Polyneuropathy, unspecified: Secondary | ICD-10-CM

## 2014-03-22 MED ORDER — GABAPENTIN 300 MG PO CAPS: 300.0000 mg | ORAL_CAPSULE | Freq: Three times a day (TID) | ORAL | Status: DC

## 2014-03-22 NOTE — Progress Notes (Signed)
   Subjective:    Patient ID: [name], male    DOB: 1950/12/12, 63 y.o.   MRN: [id number]  HPI Pt presents to the office for left foot that radiates up to his leg. Pt states his pain is intermittent aching pain a "11 out 10".  Pt has right BKA. Pt is a diabetic. Pt states his blood sugars avg 140-150s. Pt states he has been diagnosed with neuropathy in the past.    Review of Systems  Constitutional: Negative.   HENT: Negative.   Respiratory: Negative.   Cardiovascular: Negative.   Gastrointestinal: Negative.   Endocrine: Negative.   Genitourinary: Negative.   Musculoskeletal: Negative.   Neurological: Negative.   Hematological: Negative.   Psychiatric/Behavioral: Negative.   All other systems reviewed and are negative.      Objective:   Physical Exam  Vitals reviewed. Constitutional: He is oriented to person, place, and time. He appears well-developed and well-nourished. No distress.  Eyes: Pupils are equal, round, and reactive to light. Right eye exhibits no discharge. Left eye exhibits no discharge.  Neck: Normal range of motion. Neck supple. No thyromegaly present.  Cardiovascular: Normal rate, regular rhythm, normal heart sounds and intact distal pulses.   No murmur heard. Pulmonary/Chest: Effort normal and breath sounds normal. No respiratory distress. He has no wheezes.  Abdominal: Soft. Bowel sounds are normal. He exhibits no distension. There is no tenderness.  Musculoskeletal: Normal range of motion. He exhibits no edema and no tenderness.  Right BKA, left great toe amputation   Neurological: He is alert and oriented to person, place, and time. He has normal reflexes. No cranial nerve deficit.  Skin: Skin is warm and dry. No rash noted. No erythema.  Psychiatric: He has a normal mood and affect. His behavior is normal. Judgment and thought content normal.    BP 135/65  Pulse 75  Temp(Src) 97.9 F (36.6 C) (Oral)  Ht 5\' 6"  (1.676 m)  Wt 171 lb (77.565  kg)  BMI 27.61 kg/m2  *See diabetic foot note      Assessment & Plan:  1. Peripheral vascular disease - gabapentin (NEURONTIN) 300 MG capsule; Take 1 capsule (300 mg total) by mouth 3 (three) times daily.  Dispense: 270 capsule; Refill: 3  2. Diabetic peripheral neuropathy -Keep good control of blood glucose -Pt needs to examine foot daily for any injuries or ulcers -RTO prn or is does not improve- Medication can be titrated  - gabapentin (NEURONTIN) 300 MG capsule; Take 1 capsule (300 mg total) by mouth 3 (three) times daily.  Dispense: 270 capsule; Refill: [place], FNP

## 2014-03-22 NOTE — Patient Instructions (Signed)

## 2014-03-26 ENCOUNTER — Other Ambulatory Visit: Payer: Self-pay | Admitting: Nurse Practitioner

## 2014-03-29 ENCOUNTER — Ambulatory Visit: Payer: Medicaid Other | Admitting: Family

## 2014-03-30 ENCOUNTER — Other Ambulatory Visit: Payer: Self-pay | Admitting: Nurse Practitioner

## 2014-04-02 ENCOUNTER — Encounter (HOSPITAL_COMMUNITY): Payer: Medicaid Other | Attending: Hematology and Oncology

## 2014-04-02 ENCOUNTER — Encounter (HOSPITAL_COMMUNITY): Payer: Self-pay

## 2014-04-02 ENCOUNTER — Telehealth (HOSPITAL_COMMUNITY): Payer: Self-pay | Admitting: Emergency Medicine

## 2014-04-02 ENCOUNTER — Other Ambulatory Visit (HOSPITAL_COMMUNITY): Payer: Self-pay | Admitting: Oncology

## 2014-04-02 ENCOUNTER — Encounter (HOSPITAL_COMMUNITY): Payer: Self-pay | Admitting: Oncology

## 2014-04-02 ENCOUNTER — Encounter (HOSPITAL_BASED_OUTPATIENT_CLINIC_OR_DEPARTMENT_OTHER): Payer: Medicaid Other

## 2014-04-02 VITALS — BP 162/67 | HR 75 | Temp 98.4°F | Resp 18 | Wt 172.0 lb

## 2014-04-02 DIAGNOSIS — Q2733 Arteriovenous malformation of digestive system vessel: Secondary | ICD-10-CM | POA: Diagnosis present

## 2014-04-02 DIAGNOSIS — D62 Acute posthemorrhagic anemia: Secondary | ICD-10-CM | POA: Diagnosis present

## 2014-04-02 DIAGNOSIS — K922 Gastrointestinal hemorrhage, unspecified: Secondary | ICD-10-CM | POA: Insufficient documentation

## 2014-04-02 DIAGNOSIS — Z23 Encounter for immunization: Secondary | ICD-10-CM

## 2014-04-02 DIAGNOSIS — D500 Iron deficiency anemia secondary to blood loss (chronic): Secondary | ICD-10-CM | POA: Diagnosis present

## 2014-04-02 DIAGNOSIS — K5521 Angiodysplasia of colon with hemorrhage: Secondary | ICD-10-CM

## 2014-04-02 DIAGNOSIS — D649 Anemia, unspecified: Secondary | ICD-10-CM | POA: Insufficient documentation

## 2014-04-02 LAB — CBC
HCT: 41.5 % (ref 39.0–52.0)
HEMOGLOBIN: 13.8 g/dL (ref 13.0–17.0)
MCH: 28.9 pg (ref 26.0–34.0)
MCHC: 33.3 g/dL (ref 30.0–36.0)
MCV: 86.8 fL (ref 78.0–100.0)
PLATELETS: 200 10*3/uL (ref 150–400)
RBC: 4.78 MIL/uL (ref 4.22–5.81)
RDW: 20.1 % — ABNORMAL HIGH (ref 11.5–15.5)
WBC: 6.9 10*3/uL (ref 4.0–10.5)

## 2014-04-02 LAB — FERRITIN: FERRITIN: 65 ng/mL (ref 22–322)

## 2014-04-02 MED ORDER — TAMSULOSIN HCL 0.4 MG PO CAPS: ORAL_CAPSULE | ORAL | Status: DC

## 2014-04-02 MED ORDER — INFLUENZA VAC SPLIT QUAD 0.5 ML IM SUSY
0.5000 mL | PREFILLED_SYRINGE | Freq: Once | INTRAMUSCULAR | Status: AC
  Administered 2014-04-02: 0.5 mL via INTRAMUSCULAR
  Filled 2014-04-02: qty 0.5

## 2014-04-02 NOTE — Patient Instructions (Signed)
[facility] Discharge Instructions  RECOMMENDATIONS MADE BY THE CONSULTANT AND ANY TEST RESULTS WILL BE SENT TO YOUR REFERRING PHYSICIAN.  EXAM FINDINGS BY THE PHYSICIAN TODAY AND SIGNS OR SYMPTOMS TO REPORT TO CLINIC OR PRIMARY PHYSICIAN: Exam and findings as discussed by Dr. Barnet Glasgow.  Will let you know if you need iron infusion once we get your lab results.  Will give you a flu vaccine today. Report increased shortness of breath or increased fatigue.  MEDICATIONS PRESCRIBED:  Flomax - take as directed.  INSTRUCTIONS/FOLLOW-UP: Follow-up in 2 months with labs and office visit.  Thank you for choosing [facility] to provide your oncology and hematology care.  To afford each patient quality time with our providers, please arrive at least 15 minutes before your scheduled appointment time.  With your help, our goal is to use those 15 minutes to complete the necessary work-up to ensure our physicians have the information they need to help with your evaluation and healthcare recommendations.    Effective January 1st, 2014, we ask that you re-schedule your appointment with our physicians should you arrive 10 or more minutes late for your appointment.  We strive to give you quality time with our providers, and arriving late affects you and other patients whose appointments are after yours.    Again, thank you for choosing [facility].  Our hope is that these requests will decrease the amount of time that you wait before being seen by our physicians.       _____________________________________________________________  Should you have questions after your visit to [facility], please contact our office at [phone] between the hours of 8:30 a.m. and 4:30 p.m.  Voicemails left after 4:30 p.m. will not be returned until the following business day.  For prescription refill requests, have your pharmacy contact our office with your  prescription refill request.    _______________________________________________________________  We hope that we have given you very good care.  You may receive a patient satisfaction survey in the mail, please complete it and return it as soon as possible.  We value your feedback!  _______________________________________________________________  Have you asked about our STAR program?  STAR stands for Survivorship Training and Rehabilitation, and this is a nationally recognized cancer care program that focuses on survivorship and rehabilitation.  Cancer and cancer treatments may cause problems, such as, pain, making you feel tired and keeping you from doing the things that you need or want to do. Cancer rehabilitation can help. Our goal is to reduce these troubling effects and help you have the best quality of life possible.  You may receive a survey from a nurse that asks questions about your current state of health.  Based on the survey results, all eligible patients will be referred to the [facility] program for an evaluation so we can better serve you!  A frequently asked questions sheet is available upon request.

## 2014-04-02 NOTE — Progress Notes (Signed)
[facility]  OFFICE PROGRESS NOTE  [address], [place] [address]  DIAGNOSIS: Iron deficiency anemia due to chronic blood loss  AVM (arteriovenous malformation) of colon with hemorrhage  Chief Complaint  Patient presents with  . iron deficiency anemia    CURRENT THERAPY: Transfusion 2 units packed red blood cells on 01/15/2014. Feraheme intravenously on 01/29/2014 and 02/05/2014. Last hemoglobin on 03/05/2014 was 12.9. He denies any increasing shortness of breath or fatigue. Primary problem is urinary hesitancy with frequency during the day and at least 2 episodes nocturia at night. He denies any dysuria, flank pain, fever, night sweats, lower extremity swelling or redness, chest pain, PND, orthopnea, skin rash, headache, or seizures.   INTERVAL HISTORY: CORREY WEIDNER 63 y.o. 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    MEDICAL HISTORY: Past Medical History  Diagnosis Date  . Diverticulosis 03/04/10  . Peripheral vascular disease, unspecified   . Undiagnosed cardiac murmurs   . Other symptoms involving cardiovascular system   . HLD (hyperlipidemia)   . HTN (hypertension)   . Type II or unspecified type diabetes mellitus without mention of complication, not stated as uncontrolled   . Ulcer   . Carotid artery occlusion   . Umbilical hernia now    has not been repaired  . Blood transfusion   . AVM (arteriovenous malformation) of colon with hemorrhage   . Gastritis   . Internal hemorrhoids   . Right carotid bruit   . Shortness of breath     noted /w low Hgb  . GERD (gastroesophageal reflux disease)   . Chronic kidney disease     renal failure /w osteomyelitis- 2010  . Arthritis     back  . Anemia     8/3,4,5- blood  transfusion- [facility], colonoscopy- done & found 2 areas of bleeding   . Tobacco use disorder   . Anxiety   . CHF (congestive heart failure)     3 yrs ago  . Irregular heart beat     INTERIM HISTORY: has Type II or unspecified type diabetes mellitus without mention of complication, not stated as uncontrolled; HYPERLIPIDEMIA; TOBACCO USER; HYPERTENSION; Peripheral vascular disease; SYSTOLIC MURMUR; CAROTID BRUIT, RIGHT; CAD (coronary artery disease); CKD (chronic kidney disease); Amputee, below knee; Diabetic peripheral neuropathy; Anemia; Noncompliance; Occlusion and stenosis of carotid artery without mention of cerebral infarction; GI bleeding; Hyponatremia; Acute renal failure; Aftercare following surgery of the circulatory system, NEC; AVM (arteriovenous malformation) of colon with hemorrhage; GERD (gastroesophageal reflux disease); Anxiety; and CHF (congestive heart failure) on his problem list.    ALLERGIES:  is allergic to zocor; asa; hctz; iodine; and sulfa antibiotics.  MEDICATIONS: has a current medication list which includes the following prescription(s): amlodipine, atenolol, calcium carbonate antacid, clonidine, furosemide, glipizide, pantoprazole, polyethylene glycol powder, silver sulfadiazine, valsartan, gabapentin, and tamsulosin.  SURGICAL HISTORY:  Past Surgical History  Procedure Laterality Date  . Colonoscopy  03/04/2010    Dr. Princella Pellegrini AMV without mention of ablation, scattered diverticula, internal hemorrhoids  . Bilateral common superficial femoral and profudus artery endarterectomies      2003    Dr. Sherren Mocha Early  . Esophagogastroduodenoscopy  03/2000    Dr. Tharon Aquas gastritis, H.Pylori gastritis, ?treated  . Esophagogastroduodenoscopy  05/2006    Dr. Marcello Fennel recurrent  GI bleed. antral gastritis, single gastric AVM s/p APC, CLO results?  . Colonoscopy  11/2004    Dr. Barkley Bruns  . 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 Both of these are ablated with argon plasma coagulator./ Small external hemorrhoids  . Esophagogastroduodenoscopy  01/21/2012    SLF: MILD TO MAODERATE Gastritis/ SLOW GIB LIKEY DUE TO PT BEING ON ASA ND SUBSEQUENT BLOOD/ LOSS FROM AVMS IN STOMACH AND COLON AS WELL AS GASTRITIS  . Enteroscopy  01/21/2012    Procedure: ENTEROSCOPY;  Surgeon: Danie Binder, MD;  [facility];  Service: Endoscopy;;  . No past surgeries    . Vascular surgery      rt bka and rt 1st toe amputation  . Below knee leg amputation      2010, wears prosthesis   . Endarterectomy  02/09/2012    Procedure: ENDARTERECTOMY CAROTID;  Surgeon: Rosetta Posner, MD;  Location: [facility];  Service: Vascular;  Laterality: Left;  . Carotid endarterectomy Left 02-09-12  . Esophagogastroduodenoscopy N/A 09/03/2013    Mild chronic gastritis. benign gastric polyps  . Givens capsule study N/A 09/03/2013    Dr. Oneida Alar: small bowel and colonic AVMs. no active bleeding    FAMILY HISTORY: family history includes Cancer in his brother, father, and sister; Colon cancer (age of onset: 67) in his brother; Diabetes in his mother and sister; Hyperlipidemia in his brother, father, and sister; Hypertension in his brother, father, mother, and sister; Lung cancer in his sister.  SOCIAL HISTORY:  reports that he has been smoking Cigarettes.  He has a 100 pack-year smoking history. He quit smokeless tobacco use about 2 years ago. He reports that he does not drink alcohol or use illicit drugs.  REVIEW OF SYSTEMS:  Other than that discussed above is noncontributory.  PHYSICAL EXAMINATION: ECOG PERFORMANCE STATUS: 1 - Symptomatic but completely ambulatory  Blood pressure 162/67, pulse 75, temperature 98.4 F (36.9 C), temperature source Oral, resp. rate 18, weight 172 lb (78.019 kg), SpO2 98.00%.  GENERAL:alert, no distress and  comfortable SKIN: skin color, texture, turgor are normal, no rashes or significant lesions EYES: PERLA; Conjunctiva are pink and non-injected, sclera clear SINUSES: No redness or tenderness over maxillary or ethmoid sinuses OROPHARYNX:no exudate, no erythema on lips, buccal mucosa, or tongue. NECK: supple, thyroid normal size, non-tender, without nodularity. No masses CHEST: Increased AP diameter with no breast masses. LYMPH:  no palpable lymphadenopathy in the cervical, axillary or inguinal LUNGS: clear to auscultation and percussion with normal breathing effort HEART: regular rate & rhythm and no murmurs. ABDOMEN:abdomen soft, non-tender and normal bowel sounds. No hepatosplenomegaly, ascites, or CVA tenderness. MUSCULOSKELETAL:no cyanosis of digits and no clubbing. Range of motion normal. Status post right AKA with prosthesis in place. NEURO: alert & oriented x 3 with fluent speech, no focal motor/sensory deficits   LABORATORY DATA: Lab on 04/02/2014  Component Date Value Ref Range Status  . WBC 04/02/2014 6.9  4.0 - 10.5 K/uL Final  . RBC 04/02/2014 4.78  4.22 - 5.81 MIL/uL Final  . Hemoglobin 04/02/2014 13.8  13.0 - 17.0 g/dL Final  . HCT 04/02/2014 41.5  39.0 - 52.0 % Final  . MCV 04/02/2014 86.8  78.0 - 100.0 fL Final  . MCH 04/02/2014 28.9  26.0 - 34.0 pg Final  . MCHC 04/02/2014 33.3  30.0 -  36.0 g/dL Final  . RDW 04/02/2014 20.1* 11.5 - 15.5 % Final  . Platelets 04/02/2014 200  150 - 400 K/uL Final  Office Visit on 03/14/2014  Component Date Value Ref Range Status  . Hemoglobin A1C 03/14/2014 5.4   Final   4.8-5.6  . Glucose 03/14/2014 105* 65 - 99 mg/dL Final  . BUN 03/14/2014 20  8 - 27 mg/dL Final  . Creatinine, Ser 03/14/2014 2.15* 0.76 - 1.27 mg/dL Final  . GFR calc non Af Amer 03/14/2014 32* >59 mL/min/1.73 Final  . GFR calc Af Amer 03/14/2014 37* >59 mL/min/1.73 Final  . BUN/Creatinine Ratio 03/14/2014 9* 10 - 22 Final  . Sodium 03/14/2014 140  134 - 144 mmol/L  Final  . Potassium 03/14/2014 4.2  3.5 - 5.2 mmol/L Final  . Chloride 03/14/2014 103  97 - 108 mmol/L Final  . CO2 03/14/2014 23  18 - 29 mmol/L Final  . Calcium 03/14/2014 9.0  8.6 - 10.2 mg/dL Final  . Total Protein 03/14/2014 5.9* 6.0 - 8.5 g/dL Final  . Albumin 03/14/2014 3.5* 3.6 - 4.8 g/dL Final  . Globulin, Total 03/14/2014 2.4  1.5 - 4.5 g/dL Final  . Albumin/Globulin Ratio 03/14/2014 1.5  1.1 - 2.5 Final  . Total Bilirubin 03/14/2014 <0.2  0.0 - 1.2 mg/dL Final  . Alkaline Phosphatase 03/14/2014 83  39 - 117 IU/L Final  . AST 03/14/2014 13  0 - 40 IU/L Final  . ALT 03/14/2014 13  0 - 44 IU/L Final  . LDL Particle Number 03/14/2014 803  <1000 nmol/L Final   Comment:                           Low                   < 1000                                                    Moderate         1000 - 1299                                                    Borderline-High  1300 - 1599                                                    High             1600 - 2000                                                    Very High             > 2000  . LDLC SERPL CALC-MCNC 03/14/2014 128* 0 - 99 mg/dL Final   Comment:  Optimal               <  100                                                    Above optimal     100 -  129                                                    Borderline        130 -  159                                                    High              160 -  189                                                    Very high             >  189                          LDL-C is inaccurate if patient is non-fasting.  Marland Kitchen HDL Cholesterol by NMR 03/14/2014 33* >39 mg/dL Final  . Triglycerides by NMR 03/14/2014 286* 0 - 149 mg/dL Final  . Cholesterol 03/14/2014 218* 100 - 199 mg/dL Final  . HDL Particle Number 03/14/2014 21.2* >=30.5 umol/L Final  . Small LDL Particle Number 03/14/2014 <90  <=527 nmol/L Final  . LDL Size 03/14/2014 CANCELED   Final-Edited    Comment: LDL levels not sufficient for LDL size determination.                           ----------------------------------------------------------                                           ** INTERPRETATIVE INFORMATION**                                           PARTICLE CONCENTRATION AND SIZE                                              <--Lower CVD Risk   Higher CVD Risk-->                            LDL AND HDL PARTICLES   Percentile in Reference Population  HDL-P (total)        High     75th    50th    25th   Low                                                 >34.9    34.9    30.5    26.7   <26.7                            Small LDL-P          Low      25th    50th    75th   High                                                 <117     117     527     839    >839                            LDL Size   <-Large (Pattern A)->    <-Small (Pattern B)->                                              23.0    20.6           20.5      19.0                           ----------------------------------------------------------                          Small LDL-P and LDL Size are associated with CVD risk, but not after                          LDL-P is taken into account.                          These assays were developed and their performance characteristics                          determined by [employer]. These assays have not been cleared by the                          [place] Food and Drug Administration. The clinical utility of these                          laboratory values have not been fully established.  Result canceled by the ancillary  . LP-IR Score 03/14/2014 72* <=45 Final   Comment: INSULIN RESISTANCE MARKER                              <--Insulin Sensitive    Insulin Resistant-->                                     Percentile in Reference Population                          Insulin Resistance Score                           LP-IR Score   Low   25th   50th   75th   High                                        <27   27     45     63     >63                          LP-IR Score is inaccurate if patient is non-fasting.                          The LP-IR score is a laboratory developed index that has been                          associated with insulin resistance and diabetes risk and should be                          used as one component of a physician's clinical assessment. The                          LP-IR score listed above has not been cleared by the [place] Food and                          Drug Administration.  Lab on 03/05/2014  Component Date Value Ref Range Status  . WBC 03/05/2014 6.4  4.0 - 10.5 K/uL Final  . RBC 03/05/2014 4.55  4.22 - 5.81 MIL/uL Final  . Hemoglobin 03/05/2014 12.9* 13.0 - 17.0 g/dL Final  . HCT 03/05/2014 38.9* 39.0 - 52.0 % Final  . MCV 03/05/2014 85.5  78.0 - 100.0 fL Final  . MCH 03/05/2014 28.4  26.0 - 34.0 pg Final  . MCHC 03/05/2014 33.2  30.0 - 36.0 g/dL Final  . RDW 03/05/2014 22.4* 11.5 - 15.5 % Final  . Platelets 03/05/2014 162  150 - 400 K/uL Final  . Ferritin 03/05/2014 105  22 - 322 ng/mL Final   Performed at [facility]: No new pathology.  Urinalysis    Component Value Date/Time   COLORURINE YELLOW 02/03/2012 1140   APPEARANCEUR CLEAR 02/03/2012 1140   LABSPEC 1.011 02/03/2012 1140   PHURINE 6.5 02/03/2012 1140   GLUCOSEU NEGATIVE 02/03/2012 1140  HGBUR NEGATIVE 02/03/2012 [place] 02/03/2012 1140   [place] 02/03/2012 1140   PROTEINUR >300* 02/03/2012 1140   UROBILINOGEN 0.2 02/03/2012 1140   NITRITE NEGATIVE 02/03/2012 1140   LEUKOCYTESUR NEGATIVE 02/03/2012 1140    RADIOGRAPHIC STUDIES: No results found.  ASSESSMENT:  #1. 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  #2. Inability of oral iron to keep  up with chronic blood loss perhaps due to malabsorption from use of proton pump inhibitors.  #3. Chronic obstructive pulmonary disease.  #4. Diabetes mellitus, type II, non-insulin requiring.  #5. Gastroesophageal reflux disease, on proton pump inhibitor.  #6. Hypertension, controlled. #7. Benign prostatic hypertrophy with symptoms.    PLAN:  #1. If ferritin is low, additional intravenous iron will be administered. #2. Flomax 0.4 mg at bedtime. Patient was told to call back regarding urinary symptoms. #3. Followup in 2 months with CBC and ferritin.  All questions were answered. The patient knows to call the clinic with any problems, questions or concerns. We can certainly see the patient much sooner if necessary.   I spent 25 minutes counseling the patient face to face. The total time spent in the appointment was 30 minutes.    Doroteo Bradford, MD 04/02/2014 9:37 AM  DISCLAIMER:  This note was dictated with voice recognition software.  Similar sounding words can inadvertently be transcribed inaccurately and may not be corrected upon review.

## 2014-04-02 NOTE — Progress Notes (Signed)
[name] presents today for injection per MD orders. Flu vaccine administered IM in left deltoid. Administration without incident. Patient tolerated well.

## 2014-04-02 NOTE — Progress Notes (Signed)
LABS FOR FERR CBCD 

## 2014-04-02 NOTE — Telephone Encounter (Signed)
Scheduled pt for iron transfusion tomorrow and the following wednesday

## 2014-04-03 ENCOUNTER — Encounter (HOSPITAL_BASED_OUTPATIENT_CLINIC_OR_DEPARTMENT_OTHER): Payer: Medicaid Other

## 2014-04-03 VITALS — BP 157/63 | HR 71 | Temp 98.2°F | Resp 20 | Wt 171.8 lb

## 2014-04-03 DIAGNOSIS — Q2733 Arteriovenous malformation of digestive system vessel: Secondary | ICD-10-CM

## 2014-04-03 DIAGNOSIS — K5521 Angiodysplasia of colon with hemorrhage: Secondary | ICD-10-CM

## 2014-04-03 DIAGNOSIS — D500 Iron deficiency anemia secondary to blood loss (chronic): Secondary | ICD-10-CM

## 2014-04-03 DIAGNOSIS — K922 Gastrointestinal hemorrhage, unspecified: Secondary | ICD-10-CM

## 2014-04-03 MED ORDER — SODIUM CHLORIDE 0.9 % IV SOLN
510.0000 mg | Freq: Once | INTRAVENOUS | Status: AC
  Administered 2014-04-03: 510 mg via INTRAVENOUS
  Filled 2014-04-03: qty 17

## 2014-04-03 MED ORDER — SODIUM CHLORIDE 0.9 % IV SOLN
INTRAVENOUS | Status: DC
  Administered 2014-04-03: 14:00:00 via INTRAVENOUS

## 2014-04-03 NOTE — Patient Instructions (Signed)
[name] Discharge Instructions  RECOMMENDATIONS MADE BY THE CONSULTANT AND ANY TEST RESULTS WILL BE SENT TO YOUR REFERRING PHYSICIAN.  EXAM FINDINGS BY THE PHYSICIAN TODAY AND SIGNS OR SYMPTOMS TO REPORT TO CLINIC OR PRIMARY PHYSICIAN:   Today you received Feraheme 510mg .   Return next Tuesday 04/09/14 @ 11:15 @ for your next Feraheme infusion.   Thank you for choosing [facility] to provide your oncology and hematology care.  To afford each patient quality time with our providers, please arrive at least 15 minutes before your scheduled appointment time.  With your help, our goal is to use those 15 minutes to complete the necessary work-up to ensure our physicians have the information they need to help with your evaluation and healthcare recommendations.    Effective January 1st, 2014, we ask that you re-schedule your appointment with our physicians should you arrive 10 or more minutes late for your appointment.  We strive to give you quality time with our providers, and arriving late affects you and other patients whose appointments are after yours.    Again, thank you for choosing [facility].  Our hope is that these requests will decrease the amount of time that you wait before being seen by our physicians.       _____________________________________________________________  Should you have questions after your visit to [facility], please contact our office at [phone] between the hours of 8:30 a.m. and 5:00 p.m.  Voicemails left after 4:30 p.m. will not be returned until the following business day.  For prescription refill requests, have your pharmacy contact our office with your prescription refill request.

## 2014-04-03 NOTE — Progress Notes (Signed)
Tolerated infusion well. 

## 2014-04-08 ENCOUNTER — Other Ambulatory Visit: Payer: Self-pay | Admitting: Nurse Practitioner

## 2014-04-09 ENCOUNTER — Encounter (HOSPITAL_BASED_OUTPATIENT_CLINIC_OR_DEPARTMENT_OTHER): Payer: Medicaid Other

## 2014-04-09 VITALS — BP 166/65 | HR 69 | Temp 97.8°F | Resp 20 | Wt 174.6 lb

## 2014-04-09 DIAGNOSIS — Q2733 Arteriovenous malformation of digestive system vessel: Secondary | ICD-10-CM

## 2014-04-09 DIAGNOSIS — K5521 Angiodysplasia of colon with hemorrhage: Secondary | ICD-10-CM

## 2014-04-09 DIAGNOSIS — D500 Iron deficiency anemia secondary to blood loss (chronic): Secondary | ICD-10-CM

## 2014-04-09 DIAGNOSIS — K922 Gastrointestinal hemorrhage, unspecified: Secondary | ICD-10-CM

## 2014-04-09 DIAGNOSIS — D62 Acute posthemorrhagic anemia: Secondary | ICD-10-CM

## 2014-04-09 MED ORDER — SODIUM CHLORIDE 0.9 % IV SOLN: 510.0000 mg | Freq: Once | INTRAVENOUS | Status: DC

## 2014-04-09 MED ORDER — SODIUM CHLORIDE 0.9 % IJ SOLN
10.0000 mL | INTRAMUSCULAR | Status: AC | PRN
  Administered 2014-04-09: 10 mL

## 2014-04-09 MED ORDER — SODIUM CHLORIDE 0.9 % IV SOLN
510.0000 mg | Freq: Once | INTRAVENOUS | Status: AC
  Administered 2014-04-09: 510 mg via INTRAVENOUS
  Filled 2014-04-09: qty 17

## 2014-04-09 NOTE — Patient Instructions (Signed)
[facility] Discharge Instructions  RECOMMENDATIONS MADE BY THE CONSULTANT AND ANY TEST RESULTS WILL BE SENT TO YOUR REFERRING PHYSICIAN.  You had an iron transfusion today.  PLease call the clinic if you have any questions are concerns.  Thank you for choosing [facility] to provide your oncology and hematology care.  To afford each patient quality time with our providers, please arrive at least 15 minutes before your scheduled appointment time.  With your help, our goal is to use those 15 minutes to complete the necessary work-up to ensure our physicians have the information they need to help with your evaluation and healthcare recommendations.    Effective January 1st, 2014, we ask that you re-schedule your appointment with our physicians should you arrive 10 or more minutes late for your appointment.  We strive to give you quality time with our providers, and arriving late affects you and other patients whose appointments are after yours.    Again, thank you for choosing [facility].  Our hope is that these requests will decrease the amount of time that you wait before being seen by our physicians.       _____________________________________________________________  Should you have questions after your visit to [facility], please contact our office at [phone] between the hours of 8:30 a.m. and 5:00 p.m.  Voicemails left after 4:30 p.m. will not be returned until the following business day.  For prescription refill requests, have your pharmacy contact our office with your prescription refill request.

## 2014-04-10 ENCOUNTER — Ambulatory Visit (HOSPITAL_COMMUNITY): Payer: Medicaid Other

## 2014-04-30 ENCOUNTER — Other Ambulatory Visit: Payer: Self-pay | Admitting: Nurse Practitioner

## 2014-05-28 ENCOUNTER — Other Ambulatory Visit: Payer: Self-pay | Admitting: Family Medicine

## 2014-06-03 ENCOUNTER — Encounter (HOSPITAL_BASED_OUTPATIENT_CLINIC_OR_DEPARTMENT_OTHER): Payer: Medicaid Other

## 2014-06-03 ENCOUNTER — Encounter (HOSPITAL_COMMUNITY): Payer: Self-pay

## 2014-06-03 ENCOUNTER — Encounter (HOSPITAL_COMMUNITY): Payer: Medicaid Other | Attending: Hematology and Oncology

## 2014-06-03 VITALS — BP 139/61 | HR 70 | Temp 97.8°F | Resp 16 | Wt 172.9 lb

## 2014-06-03 DIAGNOSIS — Q2733 Arteriovenous malformation of digestive system vessel: Secondary | ICD-10-CM | POA: Diagnosis present

## 2014-06-03 DIAGNOSIS — D500 Iron deficiency anemia secondary to blood loss (chronic): Secondary | ICD-10-CM | POA: Insufficient documentation

## 2014-06-03 DIAGNOSIS — D509 Iron deficiency anemia, unspecified: Secondary | ICD-10-CM

## 2014-06-03 DIAGNOSIS — K5521 Angiodysplasia of colon with hemorrhage: Secondary | ICD-10-CM

## 2014-06-03 DIAGNOSIS — K922 Gastrointestinal hemorrhage, unspecified: Secondary | ICD-10-CM | POA: Insufficient documentation

## 2014-06-03 DIAGNOSIS — D62 Acute posthemorrhagic anemia: Secondary | ICD-10-CM | POA: Diagnosis present

## 2014-06-03 DIAGNOSIS — D649 Anemia, unspecified: Secondary | ICD-10-CM | POA: Insufficient documentation

## 2014-06-03 LAB — CBC WITH DIFFERENTIAL/PLATELET
BASOS ABS: 0.1 10*3/uL (ref 0.0–0.1)
Basophils Relative: 1 % (ref 0–1)
Eosinophils Absolute: 0.5 10*3/uL (ref 0.0–0.7)
Eosinophils Relative: 7 % — ABNORMAL HIGH (ref 0–5)
HCT: 39.6 % (ref 39.0–52.0)
Hemoglobin: 13.4 g/dL (ref 13.0–17.0)
LYMPHS PCT: 15 % (ref 12–46)
Lymphs Abs: 1.1 10*3/uL (ref 0.7–4.0)
MCH: 31.5 pg (ref 26.0–34.0)
MCHC: 33.8 g/dL (ref 30.0–36.0)
MCV: 93 fL (ref 78.0–100.0)
Monocytes Absolute: 0.4 10*3/uL (ref 0.1–1.0)
Monocytes Relative: 6 % (ref 3–12)
Neutro Abs: 5.2 10*3/uL (ref 1.7–7.7)
Neutrophils Relative %: 71 % (ref 43–77)
PLATELETS: 169 10*3/uL (ref 150–400)
RBC: 4.26 MIL/uL (ref 4.22–5.81)
RDW: 14.3 % (ref 11.5–15.5)
WBC: 7.3 10*3/uL (ref 4.0–10.5)

## 2014-06-03 LAB — FERRITIN: FERRITIN: 284 ng/mL (ref 22–322)

## 2014-06-03 NOTE — Progress Notes (Signed)
LABS FOR CBCD,FERR  

## 2014-06-03 NOTE — Progress Notes (Signed)
[facility]  OFFICE PROGRESS NOTE  [place], [address], [place] [address]  DIAGNOSIS: Iron deficiency anemia - Plan: CBC with Differential, Ferritin  AVM (arteriovenous malformation) of colon with hemorrhage  Chronic blood loss anemia  Chief Complaint  Patient presents with  . Iron deficiency anemia  . Chronic blood loss    Intestinal AVMs    CURRENT THERAPY: Transfusion 2 units packed red blood cells on 01/15/2014, Feraheme intravenously on 04/03/2014, 04/09/2014 when hemoglobin was 13.8 and ferritin was 65.  INTERVAL HISTORY: [name] 63 y.o. 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 He continues to do well. He did have a new vascular lesion on his left foot which is being cared for by his orthopod. Appetite has been good with no nausea, vomiting, worsening cough or wheezing, PND, orthopnea, palpitations, melena, hematochezia, hematuria, epistaxis, hemoptysis, left lower extremity swelling or redness, skin rash, headache, or seizures.  MEDICAL HISTORY: Past Medical History  Diagnosis Date  . Diverticulosis 03/04/10  . Peripheral vascular disease, unspecified   . Undiagnosed cardiac murmurs   . Other symptoms involving cardiovascular system   . HLD (hyperlipidemia)   . HTN (hypertension)   . Type II or unspecified type diabetes mellitus without mention of complication, not stated as uncontrolled   . Ulcer   . Carotid artery occlusion   . Umbilical hernia now    has not been repaired  . Blood transfusion   . AVM (arteriovenous malformation) of colon with hemorrhage   . Gastritis   . Internal hemorrhoids   . Right carotid bruit   . Shortness of breath     noted /w low Hgb  . GERD (gastroesophageal  reflux disease)   . Chronic kidney disease     renal failure /w osteomyelitis- 2010  . Arthritis     back  . Anemia     8/3,4,5- blood transfusion- [facility], colonoscopy- done & found 2 areas of bleeding   . Tobacco use disorder   . Anxiety   . CHF (congestive heart failure)     3 yrs ago  . Irregular heart beat   . Iron deficiency anemia due to chronic blood loss 10/07/2010    INTERIM HISTORY: has Type II or unspecified type diabetes mellitus without mention of complication, not stated as uncontrolled; HYPERLIPIDEMIA; TOBACCO USER; HYPERTENSION; Peripheral vascular disease; SYSTOLIC MURMUR; CAROTID BRUIT, RIGHT; CAD (coronary artery disease); CKD (chronic kidney disease); Amputee, below knee; Diabetic peripheral neuropathy; Iron deficiency anemia due to chronic blood loss; Noncompliance; Occlusion and stenosis of carotid artery without mention of cerebral infarction; GI bleeding; Hyponatremia; Acute renal failure; Aftercare following surgery of the circulatory system, NEC; AVM (arteriovenous malformation) of colon with hemorrhage; GERD (gastroesophageal reflux disease); Anxiety; and CHF (congestive heart failure) on his problem list.    ALLERGIES:  is allergic to zocor; asa; hctz; iodine; and sulfa antibiotics.  MEDICATIONS: has a current medication list which includes the following prescription(s): amlodipine, atenolol, calcium carbonate antacid, clonidine, furosemide, gabapentin, glipizide, glipizide, pantoprazole, polyethylene glycol powder, silver sulfadiazine, tamsulosin, and valsartan.  SURGICAL HISTORY:  Past Surgical History  Procedure Laterality Date  . Colonoscopy  03/04/2010    Dr. Princella Pellegrini AMV without mention of ablation, scattered diverticula, internal hemorrhoids  . Bilateral common superficial femoral and  profudus artery endarterectomies      2003    Dr. Sherren Mocha Early  . Esophagogastroduodenoscopy  03/2000    Dr. Tharon Aquas gastritis, H.Pylori gastritis, ?treated  .  Esophagogastroduodenoscopy  05/2006    Dr. Marcello Fennel recurrent GI bleed. antral gastritis, single gastric AVM s/p APC, CLO results?  . Colonoscopy  11/2004    Dr. Barkley Bruns  . 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 Both of these are ablated with argon plasma coagulator./ Small external hemorrhoids  . Esophagogastroduodenoscopy  01/21/2012    SLF: MILD TO MAODERATE Gastritis/ SLOW GIB LIKEY DUE TO PT BEING ON ASA ND SUBSEQUENT BLOOD/ LOSS FROM AVMS IN STOMACH AND COLON AS WELL AS GASTRITIS  . Enteroscopy  01/21/2012    Procedure: ENTEROSCOPY;  Surgeon: Danie Binder, MD;  [facility];  Service: Endoscopy;;  . No past surgeries    . Vascular surgery      rt bka and rt 1st toe amputation  . Below knee leg amputation      2010, wears prosthesis   . Endarterectomy  02/09/2012    Procedure: ENDARTERECTOMY CAROTID;  Surgeon: Rosetta Posner, MD;  Location: [facility];  Service: Vascular;  Laterality: Left;  . Carotid endarterectomy Left 02-09-12  . Esophagogastroduodenoscopy N/A 09/03/2013    Mild chronic gastritis. benign gastric polyps  . Givens capsule study N/A 09/03/2013    Dr. Oneida Alar: small bowel and colonic AVMs. no active bleeding    FAMILY HISTORY: family history includes Cancer in his brother, father, and sister; Colon cancer (age of onset: 46) in his brother; Diabetes in his mother and sister; Hyperlipidemia in his brother, father, and sister; Hypertension in his brother, father, mother, and sister; Lung cancer in his sister.  SOCIAL HISTORY:  reports that he has been smoking Cigarettes.  He has a 100 pack-year smoking history. He quit smokeless tobacco use about 2 years ago. He reports that he does not drink alcohol or use illicit drugs.  REVIEW OF SYSTEMS:  Other than that discussed above is noncontributory.  PHYSICAL  EXAMINATION: ECOG PERFORMANCE STATUS: 1 - Symptomatic but completely ambulatory  Blood pressure 139/61, pulse 70, temperature 97.8 F (36.6 C), temperature source Oral, resp. rate 16, weight 172 lb 14.4 oz (78.427 kg), SpO2 98 %.  GENERAL:alert, no distress and comfortable SKIN: skin color, texture, turgor are normal, no rashes or significant lesions EYES: PERLA; Conjunctiva are pink and non-injected, sclera clear SINUSES: No redness or tenderness over maxillary or ethmoid sinuses OROPHARYNX:no exudate, no erythema on lips, buccal mucosa, or tongue. NECK: supple, thyroid normal size, non-tender, without nodularity. No masses CHEST: Increased AP diameter with no breast masses. LYMPH:  no palpable lymphadenopathy in the cervical, axillary or inguinal LUNGS: clear to auscultation and percussion with normal breathing effort HEART: regular rate & rhythm and no murmurs. ABDOMEN:abdomen soft, non-tender and normal bowel sounds MUSCULOSKELETAL:no cyanosis of digits and no clubbing. Range of motion normal. Right AKA with clean stump. Left lower extremity without edema, some  NEURO: alert & oriented x 3 with fluent speech, no focal motor/sensory deficits   LABORATORY DATA: Lab on 06/03/2014  Component Date Value Ref Range Status  . WBC 06/03/2014 7.3  4.0 - 10.5 K/uL Final  . RBC 06/03/2014 4.26  4.22 - 5.81 MIL/uL Final  . Hemoglobin 06/03/2014 13.4  13.0 - 17.0 g/dL  Final  . HCT 06/03/2014 39.6  39.0 - 52.0 % Final  . MCV 06/03/2014 93.0  78.0 - 100.0 fL Final  . MCH 06/03/2014 31.5  26.0 - 34.0 pg Final  . MCHC 06/03/2014 33.8  30.0 - 36.0 g/dL Final  . RDW 06/03/2014 14.3  11.5 - 15.5 % Final  . Platelets 06/03/2014 169  150 - 400 K/uL Final  . Neutrophils Relative % 06/03/2014 71  43 - 77 % Final  . Neutro Abs 06/03/2014 5.2  1.7 - 7.7 K/uL Final  . Lymphocytes Relative 06/03/2014 15  12 - 46 % Final  . Lymphs Abs 06/03/2014 1.1  0.7 - 4.0 K/uL Final  . Monocytes Relative 06/03/2014 6   3 - 12 % Final  . Monocytes Absolute 06/03/2014 0.4  0.1 - 1.0 K/uL Final  . Eosinophils Relative 06/03/2014 7* 0 - 5 % Final  . Eosinophils Absolute 06/03/2014 0.5  0.0 - 0.7 K/uL Final  . Basophils Relative 06/03/2014 1  0 - 1 % Final  . Basophils Absolute 06/03/2014 0.1  0.0 - 0.1 K/uL Final    PATHOLOGY: No new pathology.  Urinalysis    Component Value Date/Time   COLORURINE YELLOW 02/03/2012 1140   APPEARANCEUR CLEAR 02/03/2012 1140   LABSPEC 1.011 02/03/2012 1140   PHURINE 6.5 02/03/2012 1140   GLUCOSEU NEGATIVE 02/03/2012 1140   HGBUR NEGATIVE 02/03/2012 1140   BILIRUBINUR NEGATIVE 02/03/2012 1140   KETONESUR NEGATIVE 02/03/2012 1140   PROTEINUR >300* 02/03/2012 1140   UROBILINOGEN 0.2 02/03/2012 1140   NITRITE NEGATIVE 02/03/2012 1140   LEUKOCYTESUR NEGATIVE 02/03/2012 1140    RADIOGRAPHIC STUDIES: No results found.  ASSESSMENT:  #1. 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  #2. Inability of oral iron to keep up with chronic blood loss perhaps due to malabsorption from use of proton pump inhibitors.  #3. Chronic obstructive pulmonary disease.  #4. Diabetes mellitus, type II, non-insulin requiring.  #5. Gastroesophageal reflux disease, on proton pump inhibitor.  #6. Hypertension, controlled. #7. Benign prostatic hypertrophy without symptoms   PLAN:  #1. If ferritin is low, additional intravenous iron will be administered. #2. Flomax 0.4 mg at bedtime.  #3. Followup in 3 months with CBC and ferritin   All questions were answered. The patient knows to call the clinic with any problems, questions or concerns. We can certainly see the patient much sooner if necessary.   I spent 25 minutes counseling the patient face to face. The total time spent in the appointment was 30 minutes.    Doroteo Bradford, MD 06/03/2014 10:40 AM  DISCLAIMER:  This note was  dictated with voice recognition software.  Similar sounding words can inadvertently be transcribed inaccurately and may not be corrected upon review.

## 2014-06-03 NOTE — Patient Instructions (Signed)
[name] Discharge Instructions  RECOMMENDATIONS MADE BY THE CONSULTANT AND ANY TEST RESULTS WILL BE SENT TO YOUR REFERRING PHYSICIAN.  EXAM FINDINGS BY THE PHYSICIAN TODAY AND SIGNS OR SYMPTOMS TO REPORT TO CLINIC OR PRIMARY PHYSICIAN: Exam and findings as discussed by Barnet Glasgow. If ferritin is low we will contact you and get you scheduled for iron infusion. Report increased fatigue or shortness of breath.     INSTRUCTIONS/FOLLOW-UP: Labs and office visit in 3 months.  Thank you for choosing [facility] to provide your oncology and hematology care.  To afford each patient quality time with our providers, please arrive at least 15 minutes before your scheduled appointment time.  With your help, our goal is to use those 15 minutes to complete the necessary work-up to ensure our physicians have the information they need to help with your evaluation and healthcare recommendations.    Effective January 1st, 2014, we ask that you re-schedule your appointment with our physicians should you arrive 10 or more minutes late for your appointment.  We strive to give you quality time with our providers, and arriving late affects you and other patients whose appointments are after yours.    Again, thank you for choosing [facility].  Our hope is that these requests will decrease the amount of time that you wait before being seen by our physicians.       _____________________________________________________________  Should you have questions after your visit to [facility], please contact our office at [phone] between the hours of 8:30 a.m. and 4:30 p.m.  Voicemails left after 4:30 p.m. will not be returned until the following business day.  For prescription refill requests, have your pharmacy contact our office with your prescription refill request.    _______________________________________________________________  We hope that we have  given you very good care.  You may receive a patient satisfaction survey in the mail, please complete it and return it as soon as possible.  We value your feedback!  _______________________________________________________________  Have you asked about our STAR program?  STAR stands for Survivorship Training and Rehabilitation, and this is a nationally recognized cancer care program that focuses on survivorship and rehabilitation.  Cancer and cancer treatments may cause problems, such as, pain, making you feel tired and keeping you from doing the things that you need or want to do. Cancer rehabilitation can help. Our goal is to reduce these troubling effects and help you have the best quality of life possible.  You may receive a survey from a nurse that asks questions about your current state of health.  Based on the survey results, all eligible patients will be referred to the [facility] program for an evaluation so we can better serve you!  A frequently asked questions sheet is available upon request.

## 2014-06-04 NOTE — Addendum Note (Signed)
Addended by: Mellissa Kohut on: 06/04/2014 06:31 PM   Modules accepted: Orders

## 2014-06-25 ENCOUNTER — Telehealth: Payer: Self-pay | Admitting: Nurse Practitioner

## 2014-06-25 NOTE — Telephone Encounter (Signed)
Script for prosthesis supplies ready for patient. Copy to be scanned into chart.

## 2014-06-25 NOTE — Telephone Encounter (Signed)
Ortho can do rx for supplies

## 2014-07-05 ENCOUNTER — Other Ambulatory Visit: Payer: Self-pay | Admitting: Family Medicine

## 2014-08-06 ENCOUNTER — Other Ambulatory Visit: Payer: Self-pay | Admitting: Family

## 2014-08-15 ENCOUNTER — Other Ambulatory Visit: Payer: Self-pay | Admitting: Nurse Practitioner

## 2014-09-02 ENCOUNTER — Ambulatory Visit (HOSPITAL_COMMUNITY): Payer: Medicaid Other | Admitting: Hematology & Oncology

## 2014-09-03 ENCOUNTER — Encounter (HOSPITAL_COMMUNITY): Payer: Medicaid Other | Attending: Hematology and Oncology | Admitting: Oncology

## 2014-09-03 ENCOUNTER — Encounter (HOSPITAL_BASED_OUTPATIENT_CLINIC_OR_DEPARTMENT_OTHER): Payer: Medicaid Other

## 2014-09-03 ENCOUNTER — Encounter (HOSPITAL_COMMUNITY): Payer: Self-pay | Admitting: Oncology

## 2014-09-03 VITALS — BP 161/76 | HR 81 | Temp 98.4°F | Resp 18 | Wt 178.5 lb

## 2014-09-03 DIAGNOSIS — Q2733 Arteriovenous malformation of digestive system vessel: Secondary | ICD-10-CM | POA: Insufficient documentation

## 2014-09-03 DIAGNOSIS — K5521 Angiodysplasia of colon with hemorrhage: Secondary | ICD-10-CM

## 2014-09-03 DIAGNOSIS — D649 Anemia, unspecified: Secondary | ICD-10-CM | POA: Insufficient documentation

## 2014-09-03 DIAGNOSIS — D500 Iron deficiency anemia secondary to blood loss (chronic): Secondary | ICD-10-CM | POA: Insufficient documentation

## 2014-09-03 DIAGNOSIS — Z720 Tobacco use: Secondary | ICD-10-CM

## 2014-09-03 DIAGNOSIS — D509 Iron deficiency anemia, unspecified: Secondary | ICD-10-CM

## 2014-09-03 DIAGNOSIS — K922 Gastrointestinal hemorrhage, unspecified: Secondary | ICD-10-CM | POA: Diagnosis present

## 2014-09-03 DIAGNOSIS — D62 Acute posthemorrhagic anemia: Secondary | ICD-10-CM | POA: Insufficient documentation

## 2014-09-03 LAB — CBC WITH DIFFERENTIAL/PLATELET
Basophils Absolute: 0 10*3/uL (ref 0.0–0.1)
Basophils Relative: 1 % (ref 0–1)
Eosinophils Absolute: 0.5 10*3/uL (ref 0.0–0.7)
Eosinophils Relative: 7 % — ABNORMAL HIGH (ref 0–5)
HCT: 38.7 % — ABNORMAL LOW (ref 39.0–52.0)
HEMOGLOBIN: 12.9 g/dL — AB (ref 13.0–17.0)
Lymphocytes Relative: 17 % (ref 12–46)
Lymphs Abs: 1 10*3/uL (ref 0.7–4.0)
MCH: 31.4 pg (ref 26.0–34.0)
MCHC: 33.3 g/dL (ref 30.0–36.0)
MCV: 94.2 fL (ref 78.0–100.0)
MONOS PCT: 6 % (ref 3–12)
Monocytes Absolute: 0.4 10*3/uL (ref 0.1–1.0)
NEUTROS ABS: 4.3 10*3/uL (ref 1.7–7.7)
NEUTROS PCT: 69 % (ref 43–77)
PLATELETS: 168 10*3/uL (ref 150–400)
RBC: 4.11 MIL/uL — AB (ref 4.22–5.81)
RDW: 13.3 % (ref 11.5–15.5)
WBC: 6.2 10*3/uL (ref 4.0–10.5)

## 2014-09-03 LAB — FERRITIN: FERRITIN: 174 ng/mL (ref 22–322)

## 2014-09-03 NOTE — Patient Instructions (Signed)
[place] at [facility] Discharge Instructions  RECOMMENDATIONS MADE BY THE CONSULTANT AND ANY TEST RESULTS WILL BE SENT TO YOUR REFERRING PHYSICIAN.  Return as scheduled for lab work.  Call the clinic if you feel like you need labs sooner than scheduled. Return in 6 months for office visit.  Thank you for choosing [facility] at [facility] to provide your oncology and hematology care.  To afford each patient quality time with our provider, please arrive at least 15 minutes before your scheduled appointment time.    You need to re-schedule your appointment should you arrive 10 or more minutes late.  We strive to give you quality time with our providers, and arriving late affects you and other patients whose appointments are after yours.  Also, if you no show three or more times for appointments you may be dismissed from the clinic at the providers discretion.     Again, thank you for choosing [facility].  Our hope is that these requests will decrease the amount of time that you wait before being seen by our physicians.       _____________________________________________________________  Should you have questions after your visit to [facility], please contact our office at [phone] between the hours of 8:30 a.m. and 4:30 p.m.  Voicemails left after 4:30 p.m. will not be returned until the following business day.  For prescription refill requests, have your pharmacy contact our office.

## 2014-09-03 NOTE — Progress Notes (Signed)
LABS DRAWN

## 2014-09-03 NOTE — Progress Notes (Signed)
[name], [place] [address]  Iron deficiency anemia due to chronic blood loss - Plan: CBC with Differential, Comprehensive metabolic panel, Ferritin, Iron and TIBC  CURRENT THERAPY: IV Feraheme to support iron loss.  INTERVAL HISTORY: [name] 64 y.o. 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  Status post transfusion 2 units packed red blood cells on 01/15/2014, followed by Feraheme intravenously on 01/29/2014, 02/05/2014, 04/03/2014, and 04/09/2014.  I personally reviewed and went over laboratory results with the patient.  The results are noted within this dictation.  His labs from December were excellent.  Labs are being updated today.  He denies any complaints.  He denies any blood in stool and black tarry stool.  He denies any hemoptysis or hematemesis.  He reports that he feels well.  He reports that he smoke 2 ppd and has been smoking that for quite some time.  He qualifies for lung cancer screening and this will be addressed on his next visit.  Today I focused on smoking cessation education since that is the overall goal.    Hematologically, he denies nay complaints and ROS questioning is negative.  Past Medical History  Diagnosis Date  . Diverticulosis 03/04/10  . Peripheral vascular disease, unspecified   . Undiagnosed cardiac murmurs   . Other symptoms involving cardiovascular system   . HLD (hyperlipidemia)   . HTN (hypertension)   . Type II or unspecified type diabetes mellitus without mention of complication, not stated as uncontrolled   . Ulcer   . Carotid artery occlusion   . Umbilical hernia now    has not been repaired  . Blood transfusion   . AVM (arteriovenous malformation) of colon with hemorrhage   . Gastritis   . Internal hemorrhoids   . Right carotid bruit   . Shortness of breath     noted /w low Hgb  . GERD (gastroesophageal reflux disease)   . Chronic kidney  disease     renal failure /w osteomyelitis- 2010  . Arthritis     back  . Anemia     8/3,4,5- blood transfusion- [facility], colonoscopy- done & found 2 areas of bleeding   . Tobacco use disorder   . Anxiety   . CHF (congestive heart failure)     3 yrs ago  . Irregular heart beat   . Iron deficiency anemia due to chronic blood loss 10/07/2010    has Type II or unspecified type diabetes mellitus without mention of complication, not stated as uncontrolled; HYPERLIPIDEMIA; TOBACCO USER; HYPERTENSION; Peripheral vascular disease; SYSTOLIC MURMUR; CAROTID BRUIT, RIGHT; CAD (coronary artery disease); CKD (chronic kidney disease); Amputee, below knee; Diabetic peripheral neuropathy; Iron deficiency anemia due to chronic blood loss; Noncompliance; Occlusion and stenosis of carotid artery without mention of cerebral infarction; GI bleeding; Hyponatremia; Acute renal failure; Aftercare following surgery of the circulatory system, NEC; AVM (arteriovenous malformation) of colon with hemorrhage; GERD (gastroesophageal reflux disease); Anxiety; and CHF (congestive heart failure) on his problem list.     is allergic to zocor; asa; hctz; iodine; and sulfa antibiotics.  [name] had no medications administered during this visit.  Past Surgical History  Procedure Laterality Date  . Colonoscopy  03/04/2010    [name] AMV without mention of ablation, scattered diverticula, internal hemorrhoids  . Bilateral common superficial femoral and profudus artery endarterectomies      2003    [name]  .  Esophagogastroduodenoscopy  03/2000    [name] gastritis, H.Pylori gastritis, ?treated  . Esophagogastroduodenoscopy  05/2006    [name] recurrent GI bleed. antral gastritis, single gastric AVM s/p APC, CLO results?  . Colonoscopy  11/2004    [name]  . 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 Colonoscopy  01/22/2012    [name]: Two cecal  AV malformations without stigmata of bleed with maximal.meter of 8-10 mm. Both of these are ablated with argon plasma coagulator./ Small external hemorrhoids  . Esophagogastroduodenoscopy  01/21/2012    SLF: MILD TO MAODERATE Gastritis/ SLOW GIB LIKEY DUE TO PT BEING ON ASA ND SUBSEQUENT BLOOD/ LOSS FROM AVMS IN STOMACH AND COLON AS WELL AS GASTRITIS  . Enteroscopy  01/21/2012    Procedure: ENTEROSCOPY;  Surgeon: [name] Binder, MD;  [facility];  Service: Endoscopy;;  . No past surgeries    . Vascular surgery      rt bka and rt 1st toe amputation  . Below knee leg amputation      2010, wears prosthesis   . Endarterectomy  02/09/2012    Procedure: ENDARTERECTOMY CAROTID;  Surgeon: [name] Posner, MD;  Location: [facility];  Service: Vascular;  Laterality: Left;  . Carotid endarterectomy Left 02-09-12  . Esophagogastroduodenoscopy N/A 09/03/2013    Mild chronic gastritis. benign gastric polyps  . Givens capsule study N/A 09/03/2013    [name]: small bowel and colonic AVMs. no active bleeding    Denies any headaches, dizziness, double vision, fevers, chills, night sweats, nausea, vomiting, diarrhea, constipation, chest pain, heart palpitations, shortness of breath, blood in stool, black tarry stool, urinary pain, urinary burning, urinary frequency, hematuria.   PHYSICAL EXAMINATION  ECOG PERFORMANCE STATUS: 0 - Asymptomatic  Filed Vitals:   09/03/14 1050  BP: 161/76  Pulse: 81  Temp: 98.4 F (36.9 C)  Resp: 18    GENERAL:alert, no distress, well nourished, well developed, comfortable, cooperative, smiling and accompanied by his wife. SKIN: skin color, texture, turgor are normal, no rashes or significant lesions HEAD: Normocephalic, No masses, lesions, tenderness or abnormalities EYES: normal, PERRLA, EOMI, Conjunctiva are pink and non-injected EARS: External ears normal OROPHARYNX:lips, buccal mucosa, and tongue normal and mucous membranes are moist  NECK: supple, thyroid normal  size, non-tender, without nodularity, no stridor, non-tender, trachea midline LYMPH:  no palpable lymphadenopathy BREAST:not examined LUNGS: clear to auscultation and percussion, decreased breath sounds HEART: regular rate & rhythm, no murmurs, no gallops, S1 normal and S2 normal ABDOMEN:abdomen soft, non-tender, normal bowel sounds and no masses or organomegaly BACK: Back symmetric, no curvature., No CVA tenderness EXTREMITIES:less then 2 second capillary refill, no joint deformities, effusion, or inflammation, no edema, no skin discoloration, positive findings:  Right BLK amputation.  NEURO: alert & oriented x 3 with fluent speech, no focal motor/sensory deficits, gait normal   LABORATORY DATA: CBC    Component Value Date/Time   WBC 6.2 09/03/2014 1051   RBC 4.11* 09/03/2014 1051   RBC 3.51* 09/14/2013 1430   HGB 12.9* 09/03/2014 1051   HGB 8.0* 09/13/2013 0840   HCT 38.7* 09/03/2014 1051   PLT 168 09/03/2014 1051   MCV 94.2 09/03/2014 1051   MCH 31.4 09/03/2014 1051   MCHC 33.3 09/03/2014 1051   RDW 13.3 09/03/2014 1051   LYMPHSABS 1.0 09/03/2014 1051   MONOABS 0.4 09/03/2014 1051   EOSABS 0.5 09/03/2014 1051   BASOSABS 0.0 09/03/2014 1051      Chemistry  Component Value Date/Time   NA 140 03/14/2014 1057   NA 139 09/14/2013 1430   K 4.2 03/14/2014 1057   CL 103 03/14/2014 1057   CO2 23 03/14/2014 1057   BUN 20 03/14/2014 1057   BUN 22 09/14/2013 1430   CREATININE 2.15* 03/14/2014 1057   CREATININE 1.59* 12/06/2012 0852      Component Value Date/Time   CALCIUM 9.0 03/14/2014 1057   ALKPHOS 83 03/14/2014 1057   AST 13 03/14/2014 1057   ALT 13 03/14/2014 1057   BILITOT <0.2 03/14/2014 1057      Lab Results  Component Value Date   IRON 18* 09/14/2013   TIBC 293 09/14/2013   FERRITIN 284 06/03/2014     ASSESSMENT AND PLAN:  Iron deficiency anemia due to chronic blood loss Iron deficiency anemia secondary to intestinal AVMs requiring IV iron to  support counts; complicated by inability of oral iron to keep up with chronic blood loss perhaps due to malabsorption from use of proton pump inhibitors. Labs today as ordered.  Repeat labs in 3 and 6 months: CBC diff, CMET, Iron/TIBC, Ferritin.  Return in 6 months for follow-up.   THERAPY PLAN:  Continue with IV iron as needed to support blood counts.   All questions were answered. The patient knows to call the clinic with any problems, questions or concerns. We can certainly see the patient much sooner if necessary.  Patient and plan discussed with Dr. Ancil Linsey and she is in agreement with the aforementioned.   This note is electronically signed by: Robynn Pane 09/03/2014 11:19 AM

## 2014-09-03 NOTE — Assessment & Plan Note (Signed)
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 Labs today as ordered.  Repeat labs in 3 and 6 months: CBC diff, CMET, Iron/TIBC, Ferritin.  Return in 6 months for follow-up.

## 2014-09-20 ENCOUNTER — Other Ambulatory Visit: Payer: Self-pay | Admitting: Nurse Practitioner

## 2014-09-27 ENCOUNTER — Other Ambulatory Visit: Payer: Self-pay | Admitting: Nurse Practitioner

## 2014-10-09 ENCOUNTER — Other Ambulatory Visit: Payer: Self-pay | Admitting: Family

## 2014-10-09 ENCOUNTER — Telehealth: Payer: Self-pay | Admitting: Family

## 2014-10-11 NOTE — Telephone Encounter (Signed)
Was done under Rx pool on 4/21

## 2014-10-17 ENCOUNTER — Other Ambulatory Visit: Payer: Self-pay | Admitting: Family

## 2014-10-19 ENCOUNTER — Other Ambulatory Visit: Payer: Self-pay | Admitting: Family

## 2014-10-21 ENCOUNTER — Other Ambulatory Visit: Payer: Self-pay | Admitting: Family

## 2014-11-06 ENCOUNTER — Other Ambulatory Visit: Payer: Self-pay | Admitting: Family

## 2014-11-20 ENCOUNTER — Other Ambulatory Visit: Payer: Self-pay | Admitting: Family

## 2014-11-20 ENCOUNTER — Other Ambulatory Visit: Payer: Self-pay | Admitting: Family Medicine

## 2014-12-04 ENCOUNTER — Encounter (HOSPITAL_COMMUNITY): Payer: Medicaid Other | Attending: Hematology & Oncology

## 2014-12-04 DIAGNOSIS — D500 Iron deficiency anemia secondary to blood loss (chronic): Secondary | ICD-10-CM

## 2014-12-04 LAB — IRON AND TIBC
Iron: 51 ug/dL (ref 45–182)
Saturation Ratios: 20 % (ref 17.9–39.5)
TIBC: 259 ug/dL (ref 250–450)
UIBC: 208 ug/dL

## 2014-12-04 LAB — CBC WITH DIFFERENTIAL/PLATELET
BASOS PCT: 1 % (ref 0–1)
Basophils Absolute: 0 10*3/uL (ref 0.0–0.1)
Eosinophils Absolute: 0.5 10*3/uL (ref 0.0–0.7)
Eosinophils Relative: 8 % — ABNORMAL HIGH (ref 0–5)
HEMATOCRIT: 36 % — AB (ref 39.0–52.0)
HEMOGLOBIN: 11.8 g/dL — AB (ref 13.0–17.0)
LYMPHS ABS: 1.1 10*3/uL (ref 0.7–4.0)
LYMPHS PCT: 17 % (ref 12–46)
MCH: 31 pg (ref 26.0–34.0)
MCHC: 32.8 g/dL (ref 30.0–36.0)
MCV: 94.5 fL (ref 78.0–100.0)
MONO ABS: 0.4 10*3/uL (ref 0.1–1.0)
MONOS PCT: 5 % (ref 3–12)
Neutro Abs: 4.4 10*3/uL (ref 1.7–7.7)
Neutrophils Relative %: 69 % (ref 43–77)
Platelets: 183 10*3/uL (ref 150–400)
RBC: 3.81 MIL/uL — ABNORMAL LOW (ref 4.22–5.81)
RDW: 13.8 % (ref 11.5–15.5)
WBC: 6.5 10*3/uL (ref 4.0–10.5)

## 2014-12-04 LAB — COMPREHENSIVE METABOLIC PANEL
ALT: 19 U/L (ref 17–63)
ANION GAP: 7 (ref 5–15)
AST: 20 U/L (ref 15–41)
Albumin: 3.2 g/dL — ABNORMAL LOW (ref 3.5–5.0)
Alkaline Phosphatase: 91 U/L (ref 38–126)
BUN: 41 mg/dL — ABNORMAL HIGH (ref 6–20)
CO2: 26 mmol/L (ref 22–32)
CREATININE: 3.36 mg/dL — AB (ref 0.61–1.24)
Calcium: 8.7 mg/dL — ABNORMAL LOW (ref 8.9–10.3)
Chloride: 103 mmol/L (ref 101–111)
GFR calc Af Amer: 21 mL/min — ABNORMAL LOW (ref >60)
GFR, EST NON AFRICAN AMERICAN: 18 mL/min — AB (ref >60)
GLUCOSE: 167 mg/dL — AB (ref 65–99)
Potassium: 4.8 mmol/L (ref 3.5–5.1)
Sodium: 136 mmol/L (ref 135–145)
Total Bilirubin: 0.5 mg/dL (ref 0.3–1.2)
Total Protein: 6.4 g/dL — ABNORMAL LOW (ref 6.5–8.1)

## 2014-12-04 LAB — FERRITIN: Ferritin: 36 ng/mL (ref 24–336)

## 2014-12-04 NOTE — Progress Notes (Signed)
Labs drawn

## 2014-12-09 ENCOUNTER — Telehealth (HOSPITAL_COMMUNITY): Payer: Self-pay | Admitting: Emergency Medicine

## 2014-12-09 ENCOUNTER — Other Ambulatory Visit (HOSPITAL_COMMUNITY): Payer: Self-pay | Admitting: Oncology

## 2014-12-09 DIAGNOSIS — D500 Iron deficiency anemia secondary to blood loss (chronic): Secondary | ICD-10-CM

## 2014-12-09 NOTE — Telephone Encounter (Signed)
-----   Message from Baird Cancer, PA-C sent at 12/09/2014  9:32 AM EDT ----- Set-up for ferric gluconate 125 mg x 2.  Order signed and held.  Send a copy of labs to primary care provider since his renal function has grossly changed compared to Sept 2015.

## 2014-12-09 NOTE — Telephone Encounter (Signed)
Ferric gluconate x2 scheduled and labs faxed to PCP

## 2014-12-12 ENCOUNTER — Encounter (HOSPITAL_BASED_OUTPATIENT_CLINIC_OR_DEPARTMENT_OTHER): Payer: Medicaid Other

## 2014-12-12 VITALS — BP 173/66 | HR 63 | Temp 98.0°F | Resp 16

## 2014-12-12 DIAGNOSIS — D500 Iron deficiency anemia secondary to blood loss (chronic): Secondary | ICD-10-CM

## 2014-12-12 MED ORDER — SODIUM CHLORIDE 0.9 % IV SOLN
INTRAVENOUS | Status: DC
  Administered 2014-12-12: 14:00:00 via INTRAVENOUS

## 2014-12-12 MED ORDER — SODIUM CHLORIDE 0.9 % IV SOLN
125.0000 mg | Freq: Once | INTRAVENOUS | Status: AC
  Administered 2014-12-12: 125 mg via INTRAVENOUS
  Filled 2014-12-12: qty 10

## 2014-12-12 NOTE — Progress Notes (Signed)
Tolerated infusion well. 

## 2014-12-16 ENCOUNTER — Other Ambulatory Visit: Payer: Self-pay

## 2014-12-19 ENCOUNTER — Ambulatory Visit (HOSPITAL_COMMUNITY): Payer: Medicaid Other

## 2014-12-20 ENCOUNTER — Encounter (HOSPITAL_COMMUNITY): Payer: Self-pay

## 2014-12-20 ENCOUNTER — Encounter (HOSPITAL_COMMUNITY): Payer: Medicaid Other | Attending: Hematology & Oncology

## 2014-12-20 VITALS — BP 167/69 | HR 72 | Temp 98.0°F | Resp 18

## 2014-12-20 DIAGNOSIS — Q2733 Arteriovenous malformation of digestive system vessel: Secondary | ICD-10-CM

## 2014-12-20 DIAGNOSIS — D500 Iron deficiency anemia secondary to blood loss (chronic): Secondary | ICD-10-CM

## 2014-12-20 MED ORDER — SODIUM CHLORIDE 0.9 % IV SOLN
125.0000 mg | Freq: Once | INTRAVENOUS | Status: AC
  Administered 2014-12-20: 125 mg via INTRAVENOUS
  Filled 2014-12-20: qty 10

## 2014-12-20 MED ORDER — SODIUM CHLORIDE 0.9 % IV SOLN
INTRAVENOUS | Status: DC
  Administered 2014-12-20: 14:00:00 via INTRAVENOUS

## 2014-12-20 NOTE — Patient Instructions (Signed)
[facility] at [facility] Discharge Instructions  RECOMMENDATIONS MADE BY THE CONSULTANT AND ANY TEST RESULTS WILL BE SENT TO YOUR REFERRING PHYSICIAN.  Iron infusion today. Return as scheduled for lab work and office visit.    Thank you for choosing [facility] at [facility] to provide your oncology and hematology care.  To afford each patient quality time with our provider, please arrive at least 15 minutes before your scheduled appointment time.    You need to re-schedule your appointment should you arrive 10 or more minutes late.  We strive to give you quality time with our providers, and arriving late affects you and other patients whose appointments are after yours.  Also, if you no show three or more times for appointments you may be dismissed from the clinic at the providers discretion.     Again, thank you for choosing [facility].  Our hope is that these requests will decrease the amount of time that you wait before being seen by our physicians.       _____________________________________________________________  Should you have questions after your visit to [facility], please contact our office at [phone] between the hours of 8:30 a.m. and 4:30 p.m.  Voicemails left after 4:30 p.m. will not be returned until the following business day.  For prescription refill requests, have your pharmacy contact our office.    

## 2014-12-20 NOTE — Progress Notes (Signed)
1515:  Tolerated infusion w/o adverse reaction. A&Ox4; in no distress.  VSS.  Discharged ambulatory in c/o spouse for transport home.

## 2014-12-22 ENCOUNTER — Other Ambulatory Visit: Payer: Self-pay | Admitting: Nurse Practitioner

## 2014-12-24 NOTE — Telephone Encounter (Signed)
Last seen 03/14/14 [facility]

## 2014-12-24 NOTE — Telephone Encounter (Signed)
no more refills without being seen  

## 2014-12-26 ENCOUNTER — Other Ambulatory Visit: Payer: Self-pay | Admitting: Family

## 2014-12-26 NOTE — Telephone Encounter (Signed)
Last seen 03/22/14 [facility]

## 2015-01-11 ENCOUNTER — Other Ambulatory Visit: Payer: Self-pay | Admitting: Family

## 2015-01-19 NOTE — Progress Notes (Signed)
REVIEWED-NO ADDITIONAL RECOMMENDATIONS. 

## 2015-01-25 ENCOUNTER — Other Ambulatory Visit: Payer: Self-pay | Admitting: Nurse Practitioner

## 2015-01-27 NOTE — Telephone Encounter (Signed)
Last seen 03/22/14 [facility]

## 2015-02-03 ENCOUNTER — Encounter: Payer: Self-pay | Admitting: Family

## 2015-02-03 ENCOUNTER — Ambulatory Visit (INDEPENDENT_AMBULATORY_CARE_PROVIDER_SITE_OTHER): Payer: Medicaid Other | Admitting: Family

## 2015-02-03 VITALS — BP 162/82 | HR 77 | Temp 98.6°F | Ht 66.0 in | Wt 180.6 lb

## 2015-02-03 DIAGNOSIS — E1342 Other specified diabetes mellitus with diabetic polyneuropathy: Secondary | ICD-10-CM | POA: Diagnosis not present

## 2015-02-03 DIAGNOSIS — Z89511 Acquired absence of right leg below knee: Secondary | ICD-10-CM | POA: Diagnosis not present

## 2015-02-03 DIAGNOSIS — N189 Chronic kidney disease, unspecified: Secondary | ICD-10-CM | POA: Diagnosis not present

## 2015-02-03 DIAGNOSIS — K219 Gastro-esophageal reflux disease without esophagitis: Secondary | ICD-10-CM | POA: Diagnosis not present

## 2015-02-03 DIAGNOSIS — N400 Enlarged prostate without lower urinary tract symptoms: Secondary | ICD-10-CM | POA: Insufficient documentation

## 2015-02-03 DIAGNOSIS — D500 Iron deficiency anemia secondary to blood loss (chronic): Secondary | ICD-10-CM | POA: Diagnosis not present

## 2015-02-03 DIAGNOSIS — I10 Essential (primary) hypertension: Secondary | ICD-10-CM | POA: Diagnosis not present

## 2015-02-03 DIAGNOSIS — G629 Polyneuropathy, unspecified: Secondary | ICD-10-CM

## 2015-02-03 DIAGNOSIS — E785 Hyperlipidemia, unspecified: Secondary | ICD-10-CM | POA: Diagnosis not present

## 2015-02-03 DIAGNOSIS — F419 Anxiety disorder, unspecified: Secondary | ICD-10-CM | POA: Diagnosis not present

## 2015-02-03 DIAGNOSIS — E1142 Type 2 diabetes mellitus with diabetic polyneuropathy: Secondary | ICD-10-CM

## 2015-02-03 LAB — POCT GLYCOSYLATED HEMOGLOBIN (HGB A1C): HEMOGLOBIN A1C: 5.9

## 2015-02-03 NOTE — Addendum Note (Signed)
Addended by: Evelina Dun A on: 02/03/2015 01:09 PM   Modules accepted: Orders

## 2015-02-03 NOTE — Patient Instructions (Signed)

## 2015-02-03 NOTE — Progress Notes (Signed)
Subjective:    Patient ID: [name], male    DOB: 1951-06-21, 64 y.o.   MRN: [id number]  Pt presents to the office today for chronic follow up. Pt has a history of iron deficiency anemia. Pt is followed by hematologists every 3 months and pt states she gets an "iron infusion every few months".  Diabetes He presents for his follow-up diabetic visit. He has type 2 diabetes mellitus. His disease course has been worsening. There are no hypoglycemic associated symptoms. Pertinent negatives for hypoglycemia include no confusion, headaches or mood changes. Associated symptoms include foot paresthesias. Pertinent negatives for diabetes include no blurred vision, no fatigue, no foot ulcerations and no visual change. There are no hypoglycemic complications. Symptoms are worsening. Diabetic complications include heart disease, nephropathy, peripheral neuropathy and PVD. Pertinent negatives for diabetic complications include no CVA. Risk factors for coronary artery disease include dyslipidemia, hypertension, male sex, obesity, stress, tobacco exposure and family history. Current diabetic treatment includes oral agent (monotherapy). He is compliant with treatment all of the time. His breakfast blood glucose range is generally 110-130 mg/dl. An ACE inhibitor/angiotensin II receptor blocker is being taken. Eye exam is not current.  Hypertension This is a chronic problem. The current episode started more than 1 year ago. The problem has been waxing and waning since onset. The problem is uncontrolled. Pertinent negatives include no anxiety, blurred vision, headaches, palpitations, peripheral edema or shortness of breath. Risk factors for coronary artery disease include diabetes mellitus, dyslipidemia, family history, male gender, obesity, stress and smoking/tobacco exposure. Past treatments include beta blockers, calcium channel blockers, angiotensin blockers and diuretics. The current treatment provides moderate  improvement. Hypertensive end-organ damage includes kidney disease, CAD/MI and PVD. There is no history of CVA, heart failure or a thyroid problem. There is no history of sleep apnea.  Hyperlipidemia This is a chronic problem. The current episode started more than 1 year ago. The problem is uncontrolled. Recent lipid tests were reviewed and are high. Exacerbating diseases include diabetes. Factors aggravating his hyperlipidemia include smoking. Pertinent negatives include no shortness of breath. Current antihyperlipidemic treatment includes diet change. The current treatment provides no improvement of lipids. Risk factors for coronary artery disease include diabetes mellitus, dyslipidemia, family history, hypertension, male sex and a sedentary lifestyle.  Benign Prostatic Hypertrophy This is a chronic problem. The current episode started more than 1 year ago. The problem is unchanged. Irritative symptoms include nocturia and urgency. Obstructive symptoms include an intermittent stream and a slower stream. Pertinent negatives include no dysuria or hematuria. Past treatments include tamsulosin. The treatment provided moderate relief.  Gastrophageal Reflux He reports no choking, no coughing, no heartburn or no sore throat. This is a chronic problem. The current episode started more than 1 year ago. The problem occurs rarely. The problem has been resolved. The symptoms are aggravated by exertion, lying down and smoking. Pertinent negatives include no fatigue. He has tried a PPI for the symptoms. The treatment provided significant relief.      Review of Systems  Constitutional: Negative.  Negative for fatigue.  HENT: Negative.  Negative for sore throat.   Eyes: Negative for blurred vision.  Respiratory: Negative.  Negative for cough, choking and shortness of breath.   Cardiovascular: Negative.  Negative for palpitations.  Gastrointestinal: Negative.  Negative for heartburn.  Endocrine: Negative.     Genitourinary: Positive for urgency and nocturia. Negative for dysuria and hematuria.  Musculoskeletal: Negative.   Neurological: Negative.  Negative for headaches.  Hematological: Negative.  Psychiatric/Behavioral: Negative.  Negative for confusion.  All other systems reviewed and are negative.      Objective:   Physical Exam  Constitutional: He is oriented to person, place, and time. He appears well-developed and well-nourished. No distress.  HENT:  Head: Normocephalic.  Right Ear: External ear normal.  Left Ear: External ear normal.  Mouth/Throat: Oropharynx is clear and moist.  Eyes: Pupils are equal, round, and reactive to light. Right eye exhibits no discharge. Left eye exhibits no discharge.  Neck: Normal range of motion. Neck supple. No thyromegaly present.  Cardiovascular: Normal rate, regular rhythm, normal heart sounds and intact distal pulses.   No murmur heard. Pulmonary/Chest: Effort normal and breath sounds normal. No respiratory distress. He has no wheezes.  Abdominal: Soft. Bowel sounds are normal. He exhibits no distension. There is no tenderness.  Musculoskeletal: Normal range of motion. He exhibits no edema or tenderness.  Right BKA   Neurological: He is alert and oriented to person, place, and time. He has normal reflexes. No cranial nerve deficit.  Skin: Skin is warm and dry. No rash noted. No erythema.  Psychiatric: He has a normal mood and affect. His behavior is normal. Judgment and thought content normal.  Vitals reviewed.   BP 161/84 mmHg  Pulse 77  Temp(Src) 98.6 F (37 C) (Oral)  Ht $R'5\' 6"'nq$  (1.676 m)  Wt 180 lb 9.6 oz (81.92 kg)  BMI 29.16 kg/m2       Assessment & Plan:  1. Essential hypertension -Dash diet information given -Exercise encouraged - Stress Management  -Continue current meds -RTO in 3 months - CMP14+EGFR  2. Gastroesophageal reflux disease without esophagitis - CMP14+EGFR  3. Type 2 diabetes mellitus with diabetic  polyneuropathy - CMP14+EGFR  4. CKD (chronic kidney disease), unspecified stage - CMP14+EGFR -No NSAID's -Good BP control  5. Hyperlipidemia - CMP14+EGFR - Lipid panel  6. Anxiety - CMP14+EGFR  7. Iron deficiency anemia due to chronic blood loss - CMP14+EGFR  8. Status post below knee amputation of right lower extremity - CMP14+EGFR  9. Diabetic peripheral neuropathy  - CMP14+EGFR  10. BPH (benign prostatic hyperplasia) - CMP14+EGFR - PSA, total and free   Continue all meds Labs pending Health Maintenance reviewed Diet and exercise encouraged RTO 3 months   Evelina Dun, FNP

## 2015-02-04 ENCOUNTER — Other Ambulatory Visit: Payer: Self-pay | Admitting: Family

## 2015-02-04 DIAGNOSIS — R7989 Other specified abnormal findings of blood chemistry: Secondary | ICD-10-CM

## 2015-02-04 DIAGNOSIS — N19 Unspecified kidney failure: Secondary | ICD-10-CM

## 2015-02-04 LAB — CMP14+EGFR
ALBUMIN: 3.5 g/dL — AB (ref 3.6–4.8)
ALK PHOS: 95 IU/L (ref 39–117)
ALT: 14 IU/L (ref 0–44)
AST: 13 IU/L (ref 0–40)
Albumin/Globulin Ratio: 1.5 (ref 1.1–2.5)
BUN / CREAT RATIO: 10 (ref 10–22)
BUN: 38 mg/dL — ABNORMAL HIGH (ref 8–27)
CHLORIDE: 101 mmol/L (ref 97–108)
CO2: 22 mmol/L (ref 18–29)
Calcium: 8.5 mg/dL — ABNORMAL LOW (ref 8.6–10.2)
Creatinine, Ser: 3.81 mg/dL (ref 0.76–1.27)
GFR calc Af Amer: 18 mL/min/{1.73_m2} — ABNORMAL LOW (ref >59)
GFR calc non Af Amer: 16 mL/min/{1.73_m2} — ABNORMAL LOW (ref >59)
GLOBULIN, TOTAL: 2.4 g/dL (ref 1.5–4.5)
Glucose: 87 mg/dL (ref 65–99)
POTASSIUM: 4.8 mmol/L (ref 3.5–5.2)
SODIUM: 140 mmol/L (ref 134–144)
Total Protein: 5.9 g/dL — ABNORMAL LOW (ref 6.0–8.5)

## 2015-02-04 LAB — PSA, TOTAL AND FREE
PSA FREE PCT: 66 %
PSA FREE: 0.33 ng/mL
Prostate Specific Ag, Serum: 0.5 ng/mL (ref 0.0–4.0)

## 2015-02-04 LAB — LIPID PANEL
Chol/HDL Ratio: 7.2 ratio units — ABNORMAL HIGH (ref 0.0–5.0)
Cholesterol, Total: 180 mg/dL (ref 100–199)
HDL: 25 mg/dL — ABNORMAL LOW (ref >39)
TRIGLYCERIDES: 478 mg/dL — AB (ref 0–149)

## 2015-02-05 ENCOUNTER — Telehealth: Payer: Self-pay | Admitting: Family

## 2015-02-05 ENCOUNTER — Other Ambulatory Visit: Payer: Self-pay | Admitting: Nurse Practitioner

## 2015-02-13 ENCOUNTER — Telehealth: Payer: Self-pay | Admitting: Nurse Practitioner

## 2015-02-13 NOTE — Telephone Encounter (Signed)
Fax sent for liners

## 2015-02-19 ENCOUNTER — Other Ambulatory Visit (HOSPITAL_COMMUNITY): Payer: Self-pay | Admitting: Nephrology

## 2015-02-19 DIAGNOSIS — N183 Chronic kidney disease, stage 3 unspecified: Secondary | ICD-10-CM

## 2015-03-06 ENCOUNTER — Encounter (HOSPITAL_COMMUNITY): Payer: Medicaid Other

## 2015-03-06 ENCOUNTER — Encounter (HOSPITAL_COMMUNITY): Payer: Self-pay | Admitting: Oncology

## 2015-03-06 ENCOUNTER — Encounter (HOSPITAL_COMMUNITY): Payer: Medicaid Other | Attending: Oncology | Admitting: Oncology

## 2015-03-06 ENCOUNTER — Other Ambulatory Visit (HOSPITAL_COMMUNITY): Payer: Self-pay | Admitting: Oncology

## 2015-03-06 ENCOUNTER — Ambulatory Visit (HOSPITAL_COMMUNITY): Payer: Medicaid Other | Admitting: Oncology

## 2015-03-06 ENCOUNTER — Encounter (HOSPITAL_COMMUNITY): Payer: Medicaid Other | Attending: Oncology

## 2015-03-06 VITALS — BP 183/66 | HR 80 | Temp 98.5°F | Resp 18 | Wt 182.0 lb

## 2015-03-06 DIAGNOSIS — F17200 Nicotine dependence, unspecified, uncomplicated: Secondary | ICD-10-CM

## 2015-03-06 DIAGNOSIS — Z720 Tobacco use: Secondary | ICD-10-CM

## 2015-03-06 DIAGNOSIS — D500 Iron deficiency anemia secondary to blood loss (chronic): Secondary | ICD-10-CM

## 2015-03-06 LAB — COMPREHENSIVE METABOLIC PANEL
ALBUMIN: 3.1 g/dL — AB (ref 3.5–5.0)
ALK PHOS: 91 U/L (ref 38–126)
ALT: 18 U/L (ref 17–63)
ANION GAP: 6 (ref 5–15)
AST: 24 U/L (ref 15–41)
BILIRUBIN TOTAL: 0.3 mg/dL (ref 0.3–1.2)
BUN: 39 mg/dL — AB (ref 6–20)
CO2: 24 mmol/L (ref 22–32)
Calcium: 8.6 mg/dL — ABNORMAL LOW (ref 8.9–10.3)
Chloride: 105 mmol/L (ref 101–111)
Creatinine, Ser: 3.75 mg/dL — ABNORMAL HIGH (ref 0.61–1.24)
GFR calc Af Amer: 18 mL/min — ABNORMAL LOW (ref >60)
GFR calc non Af Amer: 16 mL/min — ABNORMAL LOW (ref >60)
GLUCOSE: 215 mg/dL — AB (ref 65–99)
POTASSIUM: 4.6 mmol/L (ref 3.5–5.1)
SODIUM: 135 mmol/L (ref 135–145)
TOTAL PROTEIN: 6.5 g/dL (ref 6.5–8.1)

## 2015-03-06 LAB — CBC WITH DIFFERENTIAL/PLATELET
BASOS ABS: 0.1 10*3/uL (ref 0.0–0.1)
BASOS PCT: 1 %
EOS ABS: 0.5 10*3/uL (ref 0.0–0.7)
Eosinophils Relative: 9 %
HEMATOCRIT: 34.6 % — AB (ref 39.0–52.0)
HEMOGLOBIN: 11.5 g/dL — AB (ref 13.0–17.0)
Lymphocytes Relative: 16 %
Lymphs Abs: 1 10*3/uL (ref 0.7–4.0)
MCH: 31.3 pg (ref 26.0–34.0)
MCHC: 33.2 g/dL (ref 30.0–36.0)
MCV: 94 fL (ref 78.0–100.0)
Monocytes Absolute: 0.3 10*3/uL (ref 0.1–1.0)
Monocytes Relative: 5 %
NEUTROS ABS: 4.2 10*3/uL (ref 1.7–7.7)
NEUTROS PCT: 70 %
Platelets: 175 10*3/uL (ref 150–400)
RBC: 3.68 MIL/uL — ABNORMAL LOW (ref 4.22–5.81)
RDW: 14.1 % (ref 11.5–15.5)
WBC: 6.1 10*3/uL (ref 4.0–10.5)

## 2015-03-06 LAB — FERRITIN: Ferritin: 52 ng/mL (ref 24–336)

## 2015-03-06 LAB — IRON AND TIBC
Iron: 57 ug/dL (ref 45–182)
SATURATION RATIOS: 23 % (ref 17.9–39.5)
TIBC: 244 ug/dL — AB (ref 250–450)
UIBC: 187 ug/dL

## 2015-03-06 NOTE — Progress Notes (Signed)
[name], [place] [address]  Iron deficiency anemia due to chronic blood loss - Plan: CBC with Differential, Ferritin, Iron and TIBC, CBC with Differential, Iron and TIBC, Ferritin  TOBACCO USER  CURRENT THERAPY: IV Feraheme to support iron loss.  Oncology Flowsheet 12/12/2014 12/20/2014  ferric gluconate (NULECIT) IV 125 mg 125 mg    INTERVAL HISTORY: [name] 64 y.o. 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   I personally reviewed and went over laboratory results with the patient.  The results are noted within this dictation.  Labs are being updated today.  He reports that he smoke 2 ppd and has been smoking that for quite some time.  He qualifies for lung cancer screening.  I have provided him information regarding national lung cancer screening.  He wants to think about this and is not sure he is interested in screening.  He denies any blood in his stool and black tarry stool.  He denies any hematuria and hemoptysis.  He denies any unintentional weight loss..  Hematologically, he denies nay complaints and ROS questioning is negative.  Past Medical History  Diagnosis Date  . Diverticulosis 03/04/10  . Peripheral vascular disease, unspecified   . Undiagnosed cardiac murmurs   . Other symptoms involving cardiovascular system   . HLD (hyperlipidemia)   . HTN (hypertension)   . Type II or unspecified type diabetes mellitus without mention of complication, not stated as uncontrolled   . Ulcer   . Carotid artery occlusion   . Umbilical hernia now    has not been repaired  . Blood transfusion   . AVM (arteriovenous malformation) of colon with hemorrhage   . Gastritis   . Internal hemorrhoids   . Right carotid bruit   . Shortness of breath     noted /w low Hgb  . GERD (gastroesophageal reflux disease)   . Chronic kidney disease     renal failure /w osteomyelitis- 2010  . Arthritis       back  . Anemia     8/3,4,5- blood transfusion- [facility], colonoscopy- done & found 2 areas of bleeding   . Tobacco use disorder   . Anxiety   . CHF (congestive heart failure)     3 yrs ago  . Irregular heart beat   . Iron deficiency anemia due to chronic blood loss 10/07/2010    has Diabetes mellitus, type 2; Hyperlipidemia; TOBACCO USER; Essential hypertension; Peripheral vascular disease; SYSTOLIC MURMUR; CAROTID BRUIT, RIGHT; CAD (coronary artery disease); CKD (chronic kidney disease); Amputee, below knee; Diabetic peripheral neuropathy; Iron deficiency anemia due to chronic blood loss; Noncompliance; Occlusion and stenosis of carotid artery without mention of cerebral infarction; GI bleeding; Hyponatremia; Acute renal failure; Aftercare following surgery of the circulatory system, NEC; AVM (arteriovenous malformation) of colon with hemorrhage; GERD (gastroesophageal reflux disease); Anxiety; CHF (congestive heart failure); and BPH (benign prostatic hyperplasia) on his problem list.     is allergic to zocor; asa; hctz; iodine; and sulfa antibiotics.  [name] does not currently have medications on file.  Past Surgical History  Procedure Laterality Date  . Colonoscopy  03/04/2010    Dr. Princella Pellegrini AMV without mention of ablation, scattered diverticula, internal hemorrhoids  . Bilateral common superficial femoral and profudus artery endarterectomies      2003    Dr. Sherren Mocha Early  . Esophagogastroduodenoscopy  03/2000    Dr. Tharon Aquas gastritis, H.Pylori  gastritis, ?treated  . Esophagogastroduodenoscopy  05/2006    Dr. Marcello Fennel recurrent GI bleed. antral gastritis, single gastric AVM s/p APC, CLO results?  . Colonoscopy  11/2004    Dr. Barkley Bruns  . 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 Both of these are ablated with  argon plasma coagulator./ Small external hemorrhoids  . Esophagogastroduodenoscopy  01/21/2012    SLF: MILD TO MAODERATE Gastritis/ SLOW GIB LIKEY DUE TO PT BEING ON ASA ND SUBSEQUENT BLOOD/ LOSS FROM AVMS IN STOMACH AND COLON AS WELL AS GASTRITIS  . Enteroscopy  01/21/2012    Procedure: ENTEROSCOPY;  Surgeon: Danie Binder, MD;  Location: [facility];  Service: Endoscopy;;  . No past surgeries    . Vascular surgery      rt bka and rt 1st toe amputation  . Below knee leg amputation      2010, wears prosthesis   . Endarterectomy  02/09/2012    Procedure: ENDARTERECTOMY CAROTID;  Surgeon: Rosetta Posner, MD;  Location: [facility];  Service: Vascular;  Laterality: Left;  . Carotid endarterectomy Left 02-09-12  . Esophagogastroduodenoscopy N/A 09/03/2013    Mild chronic gastritis. benign gastric polyps  . Givens capsule study N/A 09/03/2013    Dr. Oneida Alar: small bowel and colonic AVMs. no active bleeding    Denies any headaches, dizziness, double vision, fevers, chills, night sweats, nausea, vomiting, diarrhea, constipation, chest pain, heart palpitations, shortness of breath, blood in stool, black tarry stool, urinary pain, urinary burning, urinary frequency, hematuria.   PHYSICAL EXAMINATION  ECOG PERFORMANCE STATUS: 0 - Asymptomatic  Filed Vitals:   03/06/15 1005  BP: 183/66  Pulse: 80  Temp: 98.5 F (36.9 C)  Resp: 18    GENERAL:alert, no distress, well nourished, well developed, comfortable, cooperative, smiling and accompanied by his wife. SKIN: skin color, texture, turgor are normal, no rashes or significant lesions HEAD: Normocephalic, No masses, lesions, tenderness or abnormalities EYES: normal, PERRLA, EOMI, Conjunctiva are pink and non-injected EARS: External ears normal OROPHARYNX:lips, buccal mucosa, and tongue normal and mucous membranes are moist  NECK: supple, thyroid normal size, non-tender, without nodularity, no stridor, non-tender, trachea midline LYMPH:  no palpable  lymphadenopathy BREAST:not examined LUNGS: clear to auscultation and percussion, decreased breath sounds HEART: regular rate & rhythm, no murmurs, no gallops, S1 normal and S2 normal ABDOMEN:abdomen soft, non-tender, normal bowel sounds and no masses or organomegaly BACK: Back symmetric, no curvature., No CVA tenderness EXTREMITIES:less then 2 second capillary refill, no joint deformities, effusion, or inflammation, no edema, no skin discoloration, positive findings:  Right BLK amputation.  NEURO: alert & oriented x 3 with fluent speech, no focal motor/sensory deficits, gait normal   LABORATORY DATA: CBC    Component Value Date/Time   WBC 6.1 03/06/2015 0953   RBC 3.68* 03/06/2015 0953   RBC 3.51* 09/14/2013 1430   HGB 11.5* 03/06/2015 0953   HGB 8.0* 09/13/2013 0840   HCT 34.6* 03/06/2015 0953   PLT 175 03/06/2015 0953   MCV 94.0 03/06/2015 0953   MCH 31.3 03/06/2015 0953   MCHC 33.2 03/06/2015 0953   RDW 14.1 03/06/2015 0953   LYMPHSABS 1.0 03/06/2015 0953   MONOABS 0.3 03/06/2015 0953   EOSABS 0.5 03/06/2015 0953   BASOSABS 0.1 03/06/2015 0953      Chemistry      Component Value Date/Time  NA 140 02/03/2015 1307   NA 136 12/04/2014 1033   K 4.8 02/03/2015 1307   CL 101 02/03/2015 1307   CO2 22 02/03/2015 1307   BUN 38* 02/03/2015 1307   BUN 41* 12/04/2014 1033   CREATININE 3.81* 02/03/2015 1307   CREATININE 1.59* 12/06/2012 0852      Component Value Date/Time   CALCIUM 8.5* 02/03/2015 1307   ALKPHOS 95 02/03/2015 1307   AST 13 02/03/2015 1307   ALT 14 02/03/2015 1307   BILITOT <0.2 02/03/2015 1307   BILITOT 0.5 12/04/2014 1033      Lab Results  Component Value Date   IRON 51 12/04/2014   TIBC 259 12/04/2014   FERRITIN 36 12/04/2014     ASSESSMENT AND PLAN:  Iron deficiency anemia due to chronic blood loss 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   Labs today as ordered.    Repeat labs in 3 and 6 months: CBC diff, CMET, Iron/TIBC, Ferritin.    Will maintain a ferritin of 100 or greater with IV iron replacement based upon his calculated deficit.  Return in 6 months for follow-up.  TOBACCO USER A lung cancer screening counseling and shared decision making visit. Age: 53 Pack year smoking history:  100 Current smoker No current symptoms of lung cancer Risks and benefits of lung cancer screening discussed:  Negative- over-diagnosis, radiation exposure, false positives, and additional testing  Positive- discover early stage lung cancer resulting in higher incidence of cure Patient educated regarding the importance of adherence to continued lung cancer screening. Currently, there are no co-morbidities to prevent treatment to therapy for lung cancer and the patient is agreeable to pursue treatment if a malignancy is discovered.  [place] Preventative Services Task Force recommend annual screening for lung cancer with low-dose CT in adults aged 13- 73 years who have a 30 pack year smoking history and currently smoke or have quit smoking within the past 15 years.  Screening should be discontinued once a person has not smoked for 15 years or develops a health problem that substantially limits life expectancy or the ability or willingness to have curative lung surgery.  It is a category B recommendation.  Similar stances are provided by CMS, NCCN, and AATS.  Currently, there are no co-morbidities to prevent treatment to therapy for lung cancer and the patient is agreeable to pursue treatment if a malignancy is discovered.  He would like to think about this screening tool and he is not interested at this time.  We will discuss again at follow-up in 6 months.  THERAPY PLAN:  Continue with IV iron as needed to support blood counts.   All questions were answered.  The patient knows to call the clinic with any problems, questions or concerns. We can certainly see the patient much sooner if necessary.  Patient and plan discussed with Dr. Ancil Linsey and she is in agreement with the aforementioned.   This note is electronically signed by: Robynn Pane 03/06/2015 10:53 AM

## 2015-03-06 NOTE — Assessment & Plan Note (Addendum)
A lung cancer screening counseling and shared decision making visit. Age: 64 Pack year smoking history:  100 Current smoker No current symptoms of lung cancer Risks and benefits of lung cancer screening discussed:  Negative- over-diagnosis, radiation exposure, false positives, and additional testing  Positive- discover early stage lung cancer resulting in higher incidence of cure Patient educated regarding the importance of adherence to continued lung cancer screening. Currently, there are no co-morbidities to prevent treatment to therapy for lung cancer and the patient is agreeable to pursue treatment if a malignancy is discovered.  [place] Preventative Services Task Force recommend annual screening for lung cancer with low-dose CT in adults aged 69- 15 years who have a 30 pack year smoking history and currently smoke or have quit smoking within the past 15 years.  Screening should be discontinued once a person has not smoked for 15 years or develops a health problem that substantially limits life expectancy or the ability or willingness to have curative lung surgery.  It is a category B recommendation.  Similar stances are provided by CMS, NCCN, and AATS.  Currently, there are no co-morbidities to prevent treatment to therapy for lung cancer and the patient is agreeable to pursue treatment if a malignancy is discovered.  He would like to think about this screening tool and he is not interested at this time.  We will discuss again at follow-up in 6 months.

## 2015-03-06 NOTE — Patient Instructions (Signed)
..  [place] at [facility] Discharge Instructions  RECOMMENDATIONS MADE BY THE CONSULTANT AND ANY TEST RESULTS WILL BE SENT TO YOUR REFERRING PHYSICIAN.  Labs done today and will repeat in 3 months and 6 months See Dr. Whitney Muse in 6 months  Thank you for choosing [place] at [facility] to provide your oncology and hematology care.  To afford each patient quality time with our provider, please arrive at least 15 minutes before your scheduled appointment time.    You need to re-schedule your appointment should you arrive 10 or more minutes late.  We strive to give you quality time with our providers, and arriving late affects you and other patients whose appointments are after yours.  Also, if you no show three or more times for appointments you may be dismissed from the clinic at the providers discretion.     Again, thank you for choosing [facility].  Our hope is that these requests will decrease the amount of time that you wait before being seen by our physicians.       _____________________________________________________________  Should you have questions after your visit to [facility], please contact our office at [phone] between the hours of 8:30 a.m. and 4:30 p.m.  Voicemails left after 4:30 p.m. will not be returned until the following business day.  For prescription refill requests, have your pharmacy contact our office.

## 2015-03-06 NOTE — Assessment & Plan Note (Signed)
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   Labs today as ordered.    Repeat labs in 3 and 6 months: CBC diff, CMET, Iron/TIBC, Ferritin.    Will maintain a ferritin of 100 or greater with IV iron replacement based upon his calculated deficit.  Return in 6 months for follow-up.

## 2015-03-12 ENCOUNTER — Ambulatory Visit (HOSPITAL_COMMUNITY)
Admission: RE | Admit: 2015-03-12 | Discharge: 2015-03-12 | Disposition: A | Discharge status: Home / Self Care | Payer: Medicaid Other | Source: Ambulatory Visit | Attending: Nephrology | Admitting: Nephrology

## 2015-03-12 DIAGNOSIS — N183 Chronic kidney disease, stage 3 unspecified: Secondary | ICD-10-CM

## 2015-03-13 ENCOUNTER — Encounter (HOSPITAL_BASED_OUTPATIENT_CLINIC_OR_DEPARTMENT_OTHER): Payer: Medicaid Other

## 2015-03-13 VITALS — BP 168/64 | HR 65 | Temp 98.1°F | Resp 20

## 2015-03-13 DIAGNOSIS — D500 Iron deficiency anemia secondary to blood loss (chronic): Secondary | ICD-10-CM | POA: Diagnosis present

## 2015-03-13 DIAGNOSIS — Q2733 Arteriovenous malformation of digestive system vessel: Secondary | ICD-10-CM

## 2015-03-13 MED ORDER — SODIUM CHLORIDE 0.9 % IV SOLN
125.0000 mg | Freq: Once | INTRAVENOUS | Status: AC
  Administered 2015-03-13: 125 mg via INTRAVENOUS
  Filled 2015-03-13: qty 10

## 2015-03-13 MED ORDER — SODIUM CHLORIDE 0.9 % IV SOLN: 125.0000 mg | Freq: Once | INTRAVENOUS | Status: DC

## 2015-03-13 MED ORDER — NA FERRIC GLUC CPLX IN SUCROSE 12.5 MG/ML IV SOLN: 125.0000 mg | Freq: Once | INTRAVENOUS | Status: DC

## 2015-03-13 MED ORDER — SODIUM CHLORIDE 0.9 % IV SOLN
INTRAVENOUS | Status: DC
  Administered 2015-03-13: 13:00:00 via INTRAVENOUS

## 2015-03-13 MED ORDER — SODIUM CHLORIDE 0.9 % IV SOLN: INTRAVENOUS | Status: DC

## 2015-03-13 NOTE — Patient Instructions (Signed)
[place] at [facility] Discharge Instructions  RECOMMENDATIONS MADE BY THE CONSULTANT AND ANY TEST RESULTS WILL BE SENT TO YOUR REFERRING PHYSICIAN.  Ferric Gluconate 125 mg iron infusion (1 of 2) given today as ordered. Return as scheduled.  Thank you for choosing [place] at [facility] to provide your oncology and hematology care.  To afford each patient quality time with our provider, please arrive at least 15 minutes before your scheduled appointment time.    You need to re-schedule your appointment should you arrive 10 or more minutes late.  We strive to give you quality time with our providers, and arriving late affects you and other patients whose appointments are after yours.  Also, if you no show three or more times for appointments you may be dismissed from the clinic at the providers discretion.     Again, thank you for choosing [facility].  Our hope is that these requests will decrease the amount of time that you wait before being seen by our physicians.       _____________________________________________________________  Should you have questions after your visit to [facility], please contact our office at [phone] between the hours of 8:30 a.m. and 4:30 p.m.  Voicemails left after 4:30 p.m. will not be returned until the following business day.  For prescription refill requests, have your pharmacy contact our office.

## 2015-03-17 ENCOUNTER — Telehealth: Payer: Self-pay | Admitting: Family

## 2015-03-17 ENCOUNTER — Encounter (HOSPITAL_BASED_OUTPATIENT_CLINIC_OR_DEPARTMENT_OTHER): Payer: Medicaid Other

## 2015-03-17 VITALS — BP 160/64 | HR 67 | Temp 98.0°F | Resp 16

## 2015-03-17 DIAGNOSIS — D500 Iron deficiency anemia secondary to blood loss (chronic): Secondary | ICD-10-CM | POA: Diagnosis not present

## 2015-03-17 MED ORDER — SODIUM CHLORIDE 0.9 % IV SOLN
INTRAVENOUS | Status: DC
  Administered 2015-03-17: 13:00:00 via INTRAVENOUS

## 2015-03-17 MED ORDER — SODIUM CHLORIDE 0.9 % IV SOLN
125.0000 mg | Freq: Once | INTRAVENOUS | Status: AC
  Administered 2015-03-17: 125 mg via INTRAVENOUS
  Filled 2015-03-17: qty 10

## 2015-03-17 NOTE — Progress Notes (Signed)
Tolerated iron infusion well. 

## 2015-03-17 NOTE — Patient Instructions (Signed)
[name] at [facility] Discharge Instructions  RECOMMENDATIONS MADE BY THE CONSULTANT AND ANY TEST RESULTS WILL BE SENT TO YOUR REFERRING PHYSICIAN.  Sodium Ferric Gluconate Complex injection What is this medicine? SODIUM FERRIC GLUCONATE COMPLEX (SOE dee um FER ik GLOO koe nate KOM pleks) is an iron replacement.   What should I tell my health care provider before I take this medicine? They need to know if you have any of the following conditions: -anemia that is not from iron deficiency -high levels of iron in the body -an unusual or allergic reaction to iron, benzyl alcohol, other medicines, foods, dyes, or preservatives -pregnant or are trying to become pregnant -breast-feeding  How should I use this medicine? This medicine is for infusion into a vein. It is given by a health care professional in a hospital or clinic setting. Talk to your pediatrician regarding the use of this medicine in children. While this drug may be prescribed for children as young as 62 years old for selected conditions, precautions do apply. Overdosage: If you think you have taken too much of this medicine contact a poison control center or emergency room at once. NOTE: This medicine is only for you. Do not share this medicine with others.  What if I miss a dose? It is important not to miss your dose. Call your doctor or health care professional if you are unable to keep an appointment.  What may interact with this medicine? Do not take this medicine with any of the following medications: -deferoxamine -dimercaprol -other iron products This medicine may also interact with the following medications: -chloramphenicol -deferasirox -medicine for blood pressure like enalapril This list may not describe all possible interactions. Give your health care provider a list of all the medicines, herbs, non-prescription drugs, or dietary supplements you use. Also tell them if you smoke, drink  alcohol, or use illegal drugs. Some items may interact with your medicine.  What should I watch for while using this medicine? Your condition will be monitored carefully while you are receiving this medicine. Visit your doctor for check-ups as directed.  What side effects may I notice from receiving this medicine? Side effects that you should report to your doctor or health care professional as soon as possible: -allergic reactions like skin rash, itching or hives, swelling of the face, lips, or tongue -breathing problems -changes in hearing -changes in vision -chills, flushing, or sweating -fast, irregular heartbeat -feeling faint or lightheaded, falls -fever, flu-like symptoms -high or low blood pressure -pain, tingling, numbness in the hands or feet -severe pain in the chest, back, flanks, or groin -swelling of the ankles, feet, hands -trouble passing urine or change in the amount of urine -unusually weak or tired Side effects that usually do not require medical attention (report to your doctor or health care professional if they continue or are bothersome): -cramps -dark colored stools -diarrhea -headache -nausea, vomiting -stomach upset This list may not describe all possible side effects. Call your doctor for medical advice about side effects. You may report side effects to FDA at 1-800-FDA-1088.  Thank you for choosing [place] at [facility] to provide your oncology and hematology care.  To afford each patient quality time with our provider, please arrive at least 15 minutes before your scheduled appointment time.    You need to re-schedule your appointment should you arrive 10 or more minutes late.  We strive to give you quality time with our providers, and  arriving late affects you and other patients whose appointments are after yours.  Also, if you no show three or more times for appointments you may be dismissed from the clinic at the providers  discretion.     Again, thank you for choosing [facility].  Our hope is that these requests will decrease the amount of time that you wait before being seen by our physicians.       _____________________________________________________________  Should you have questions after your visit to [facility], please contact our office at [phone] between the hours of 8:30 a.m. and 4:30 p.m.  Voicemails left after 4:30 p.m. will not be returned until the following business day.  For prescription refill requests, have your pharmacy contact our office.

## 2015-03-19 ENCOUNTER — Other Ambulatory Visit: Payer: Self-pay | Admitting: Nurse Practitioner

## 2015-03-19 NOTE — Telephone Encounter (Signed)
Forms are on MMM desk, to be signed. [place], they will get forms on 03/24/15

## 2015-03-21 ENCOUNTER — Other Ambulatory Visit: Payer: Self-pay | Admitting: Family

## 2015-03-22 ENCOUNTER — Other Ambulatory Visit: Payer: Self-pay | Admitting: Nurse Practitioner

## 2015-03-24 ENCOUNTER — Telehealth: Payer: Self-pay | Admitting: Family

## 2015-03-24 NOTE — Telephone Encounter (Signed)
Home health nurse says information was faxed 2 weeks earlier.  Paperwork has not been scanned in yet,   (waiting for person who does batch scan).   Patient will check with orthotics to see again if they have gotten paperwork.  Call our office if further difficulties.

## 2015-04-07 ENCOUNTER — Encounter: Payer: Self-pay | Admitting: Family

## 2015-04-21 ENCOUNTER — Other Ambulatory Visit: Payer: Self-pay | Admitting: Nurse Practitioner

## 2015-05-04 ENCOUNTER — Other Ambulatory Visit: Payer: Self-pay | Admitting: Family

## 2015-05-06 ENCOUNTER — Encounter: Payer: Self-pay | Admitting: Family

## 2015-05-06 ENCOUNTER — Ambulatory Visit (INDEPENDENT_AMBULATORY_CARE_PROVIDER_SITE_OTHER): Payer: Medicare Other | Admitting: Family

## 2015-05-06 VITALS — BP 182/82 | HR 83 | Temp 98.5°F | Ht 66.0 in | Wt 180.4 lb

## 2015-05-06 DIAGNOSIS — F17200 Nicotine dependence, unspecified, uncomplicated: Secondary | ICD-10-CM | POA: Diagnosis not present

## 2015-05-06 DIAGNOSIS — I739 Peripheral vascular disease, unspecified: Secondary | ICD-10-CM

## 2015-05-06 DIAGNOSIS — Z114 Encounter for screening for human immunodeficiency virus [HIV]: Secondary | ICD-10-CM | POA: Diagnosis not present

## 2015-05-06 DIAGNOSIS — Z1159 Encounter for screening for other viral diseases: Secondary | ICD-10-CM

## 2015-05-06 DIAGNOSIS — F419 Anxiety disorder, unspecified: Secondary | ICD-10-CM | POA: Diagnosis not present

## 2015-05-06 DIAGNOSIS — K219 Gastro-esophageal reflux disease without esophagitis: Secondary | ICD-10-CM | POA: Diagnosis not present

## 2015-05-06 DIAGNOSIS — Z23 Encounter for immunization: Secondary | ICD-10-CM

## 2015-05-06 DIAGNOSIS — E785 Hyperlipidemia, unspecified: Secondary | ICD-10-CM

## 2015-05-06 DIAGNOSIS — I5032 Chronic diastolic (congestive) heart failure: Secondary | ICD-10-CM

## 2015-05-06 DIAGNOSIS — I10 Essential (primary) hypertension: Secondary | ICD-10-CM | POA: Diagnosis not present

## 2015-05-06 DIAGNOSIS — E1142 Type 2 diabetes mellitus with diabetic polyneuropathy: Secondary | ICD-10-CM | POA: Diagnosis not present

## 2015-05-06 DIAGNOSIS — N189 Chronic kidney disease, unspecified: Secondary | ICD-10-CM | POA: Diagnosis not present

## 2015-05-06 DIAGNOSIS — Z89511 Acquired absence of right leg below knee: Secondary | ICD-10-CM | POA: Diagnosis not present

## 2015-05-06 DIAGNOSIS — I499 Cardiac arrhythmia, unspecified: Secondary | ICD-10-CM

## 2015-05-06 MED ORDER — VALSARTAN 320 MG PO TABS: 320.0000 mg | ORAL_TABLET | Freq: Every day | ORAL | Status: DC

## 2015-05-06 MED ORDER — AMLODIPINE BESYLATE 10 MG PO TABS: ORAL_TABLET | ORAL | Status: DC

## 2015-05-06 MED ORDER — HYDROCODONE-ACETAMINOPHEN 5-325 MG PO TABS: 1.0000 | ORAL_TABLET | Freq: Three times a day (TID) | ORAL | Status: DC | PRN

## 2015-05-06 NOTE — Progress Notes (Signed)
 Subjective:    Patient ID: [name], male    DOB: 09/02/1950, 64 y.o.   MRN: [id number]  Pt presents to the office today for chronic follow up. Pt has a history of iron deficiency anemia. Pt is followed by hematologists every 3 months and pt states she gets an "iron infusion every few months".  Hypertension This is a chronic problem. The current episode started more than 1 year ago. The problem has been waxing and waning since onset. The problem is uncontrolled. Pertinent negatives include no anxiety, blurred vision, headaches, palpitations, peripheral edema or shortness of breath. Risk factors for coronary artery disease include diabetes mellitus, dyslipidemia, family history, male gender, obesity, stress and smoking/tobacco exposure. Past treatments include beta blockers, calcium channel blockers, angiotensin blockers and diuretics. The current treatment provides moderate improvement. Hypertensive end-organ damage includes kidney disease, CAD/MI and PVD. There is no history of CVA, heart failure or a thyroid problem. There is no history of sleep apnea.  Diabetes He presents for his follow-up diabetic visit. He has type 2 diabetes mellitus. His disease course has been worsening. There are no hypoglycemic associated symptoms. Pertinent negatives for hypoglycemia include no confusion, headaches or mood changes. Associated symptoms include foot paresthesias. Pertinent negatives for diabetes include no blurred vision, no fatigue, no foot ulcerations and no visual change. There are no hypoglycemic complications. Symptoms are worsening. Diabetic complications include heart disease, nephropathy, peripheral neuropathy and PVD. Pertinent negatives for diabetic complications include no CVA. Risk factors for coronary artery disease include dyslipidemia, hypertension, male sex, obesity, stress, tobacco exposure and family history. Current diabetic treatment includes oral agent (monotherapy). He is compliant  with treatment all of the time. His breakfast blood glucose range is generally 130-140 mg/dl. An ACE inhibitor/angiotensin II receptor blocker is being taken. Eye exam is not current.  Hyperlipidemia This is a chronic problem. The current episode started more than 1 year ago. The problem is controlled. Recent lipid tests were reviewed and are normal. Exacerbating diseases include diabetes. Factors aggravating his hyperlipidemia include smoking. Pertinent negatives include no shortness of breath. Current antihyperlipidemic treatment includes diet change. The current treatment provides no improvement of lipids. Risk factors for coronary artery disease include diabetes mellitus, dyslipidemia, family history, hypertension, male sex and a sedentary lifestyle.  Benign Prostatic Hypertrophy This is a chronic problem. The current episode started more than 1 year ago. The problem is unchanged. Irritative symptoms include nocturia and urgency. Obstructive symptoms include an intermittent stream and a slower stream. Pertinent negatives include no dysuria or hematuria. Past treatments include tamsulosin. The treatment provided moderate relief.  Gastroesophageal Reflux He reports no choking, no coughing, no heartburn or no sore throat. This is a chronic problem. The current episode started more than 1 year ago. The problem occurs rarely. The problem has been resolved. The symptoms are aggravated by exertion, lying down and smoking. Pertinent negatives include no fatigue. He has tried a PPI for the symptoms. The treatment provided significant relief.      Review of Systems  Constitutional: Negative.  Negative for fatigue.  HENT: Negative.  Negative for sore throat.   Eyes: Negative for blurred vision.  Respiratory: Negative.  Negative for cough, choking and shortness of breath.   Cardiovascular: Negative.  Negative for palpitations.  Gastrointestinal: Negative.  Negative for heartburn.  Endocrine: Negative.     Genitourinary: Positive for urgency and nocturia. Negative for dysuria and hematuria.  Musculoskeletal: Negative.   Neurological: Negative.  Negative for headaches.  Hematological: Negative.     Psychiatric/Behavioral: Negative.  Negative for confusion.  All other systems reviewed and are negative.      Objective:   Physical Exam  Constitutional: He is oriented to person, place, and time. He appears well-developed and well-nourished. No distress.  HENT:  Head: Normocephalic.  Right Ear: External ear normal.  Left Ear: External ear normal.  Nose: Nose normal.  Mouth/Throat: Oropharynx is clear and moist.  Eyes: Pupils are equal, round, and reactive to light. Right eye exhibits no discharge. Left eye exhibits no discharge.  Neck: Normal range of motion. Neck supple. No thyromegaly present.  Cardiovascular: Normal rate, normal heart sounds and intact distal pulses.  An irregularly irregular rhythm present.  No murmur heard. Pulmonary/Chest: Effort normal and breath sounds normal. No respiratory distress. He has no wheezes.  Abdominal: Soft. Bowel sounds are normal. He exhibits no distension. There is no tenderness.  Musculoskeletal: Normal range of motion. He exhibits no edema or tenderness.  Right BKA   Neurological: He is alert and oriented to person, place, and time. He has normal reflexes. No cranial nerve deficit.  Skin: Skin is warm and dry. No rash noted. No erythema.  Psychiatric: He has a normal mood and affect. His behavior is normal. Judgment and thought content normal.  Vitals reviewed.   BP 182/82 mmHg  Pulse 83  Temp(Src) 98.5 F (36.9 C) (Oral)  Ht 5' 6" (1.676 m)  Wt 180 lb 6.4 oz (81.829 kg)  BMI 29.13 kg/m2  EKG- Sinus Rhythm, PVC      Assessment & Plan:  1. Chronic diastolic congestive heart failure (HCC) - CMP14+EGFR  2. Essential hypertension - CMP14+EGFR - amLODipine (NORVASC) 10 MG tablet; TAKE 1 TABLET BY MOUTH DAILY, BEFORE BREAKFAST  Dispense:  90 tablet; Refill: 1 - valsartan (DIOVAN) 320 MG tablet; Take 1 tablet (320 mg total) by mouth daily.  Dispense: 90 tablet; Refill: 2 - Ambulatory referral to Cardiology - EKG 12-Lead  3. Peripheral vascular disease (HCC)  - CMP14+EGFR  4. Gastroesophageal reflux disease without esophagitis  - CMP14+EGFR  5. Type 2 diabetes mellitus with diabetic polyneuropathy, without long-term current use of insulin (HCC) - CMP14+EGFR - POCT UA - Microalbumin - Ambulatory referral to Ophthalmology  6. CKD (chronic kidney disease), unspecified stage - CMP14+EGFR  7. TOBACCO USER - CMP14+EGFR  8. Hyperlipidemia - CMP14+EGFR - Lipid panel  9. Anxiety - CMP14+EGFR  10. Status post below knee amputation of right lower extremity (HCC) - CMP14+EGFR  11. Screening for HIV (human immunodeficiency virus) - CMP14+EGFR - HIV antibody  12. Need for hepatitis C screening test - CMP14+EGFR - Hepatitis C antibody  13. Irregular cardiac rhythm - Ambulatory referral to Cardiology - EKG 12-Lead  14. Uncontrolled hypertension - Ambulatory referral to Cardiology - EKG 12-Lead   Continue all meds Labs pending Health Maintenance reviewed-Flu vaccine given today Diet and exercise encouraged RTO 3 months  Christy Hawks, FNP   

## 2015-05-06 NOTE — Patient Instructions (Signed)

## 2015-05-06 NOTE — Addendum Note (Signed)
Addended by: Earlene Plater on: 05/06/2015 03:12 PM   Modules accepted: Orders, SmartSet

## 2015-05-07 LAB — CMP14+EGFR
A/G RATIO: 1.5 (ref 1.1–2.5)
ALT: 10 IU/L (ref 0–44)
AST: 14 IU/L (ref 0–40)
Albumin: 3.8 g/dL (ref 3.6–4.8)
Alkaline Phosphatase: 109 IU/L (ref 39–117)
BUN/Creatinine Ratio: 9 — ABNORMAL LOW (ref 10–22)
BUN: 34 mg/dL — AB (ref 8–27)
Bilirubin Total: 0.2 mg/dL (ref 0.0–1.2)
CALCIUM: 9 mg/dL (ref 8.6–10.2)
CHLORIDE: 102 mmol/L (ref 97–106)
CO2: 21 mmol/L (ref 18–29)
Creatinine, Ser: 3.65 mg/dL (ref 0.76–1.27)
GFR calc Af Amer: 19 mL/min/{1.73_m2} — ABNORMAL LOW (ref >59)
GFR, EST NON AFRICAN AMERICAN: 17 mL/min/{1.73_m2} — AB (ref >59)
GLUCOSE: 104 mg/dL — AB (ref 65–99)
Globulin, Total: 2.5 g/dL (ref 1.5–4.5)
POTASSIUM: 5 mmol/L (ref 3.5–5.2)
Sodium: 140 mmol/L (ref 136–144)
TOTAL PROTEIN: 6.3 g/dL (ref 6.0–8.5)

## 2015-05-07 LAB — LIPID PANEL
Chol/HDL Ratio: 6 ratio units — ABNORMAL HIGH (ref 0.0–5.0)
Cholesterol, Total: 174 mg/dL (ref 100–199)
HDL: 29 mg/dL — AB (ref >39)
LDL Calculated: 86 mg/dL (ref 0–99)
TRIGLYCERIDES: 297 mg/dL — AB (ref 0–149)
VLDL CHOLESTEROL CAL: 59 mg/dL — AB (ref 5–40)

## 2015-05-07 LAB — HEPATITIS C ANTIBODY: Hep C Virus Ab: <0.1 s/co ratio (ref 0.0–0.9)

## 2015-05-07 LAB — HIV ANTIBODY (ROUTINE TESTING W REFLEX): HIV Screen 4th Generation wRfx: NONREACTIVE

## 2015-05-07 LAB — MICROALBUMIN, URINE: Microalbumin, Urine: 1826.5 ug/mL

## 2015-05-07 LAB — COMMENT

## 2015-05-08 NOTE — Progress Notes (Signed)
Patient informed, has appointment with nephrologist on 05/13/15

## 2015-05-17 ENCOUNTER — Other Ambulatory Visit: Payer: Self-pay | Admitting: Family Medicine

## 2015-05-30 ENCOUNTER — Other Ambulatory Visit (HOSPITAL_COMMUNITY): Payer: Self-pay | Admitting: Oncology

## 2015-05-30 DIAGNOSIS — N400 Enlarged prostate without lower urinary tract symptoms: Secondary | ICD-10-CM

## 2015-05-30 MED ORDER — TAMSULOSIN HCL 0.4 MG PO CAPS: ORAL_CAPSULE | ORAL | Status: DC

## 2015-06-02 ENCOUNTER — Ambulatory Visit: Payer: Medicaid Other | Admitting: Internal Medicine

## 2015-06-05 ENCOUNTER — Encounter (HOSPITAL_COMMUNITY): Payer: Medicaid Other | Attending: Hematology & Oncology

## 2015-06-05 DIAGNOSIS — D500 Iron deficiency anemia secondary to blood loss (chronic): Secondary | ICD-10-CM | POA: Insufficient documentation

## 2015-06-05 LAB — CBC WITH DIFFERENTIAL/PLATELET
BASOS PCT: 0 %
Basophils Absolute: 0 10*3/uL (ref 0.0–0.1)
EOS ABS: 0.6 10*3/uL (ref 0.0–0.7)
Eosinophils Relative: 7 %
HEMATOCRIT: 32.4 % — AB (ref 39.0–52.0)
HEMOGLOBIN: 10.5 g/dL — AB (ref 13.0–17.0)
Lymphocytes Relative: 16 %
Lymphs Abs: 1.3 10*3/uL (ref 0.7–4.0)
MCH: 30.5 pg (ref 26.0–34.0)
MCHC: 32.4 g/dL (ref 30.0–36.0)
MCV: 94.2 fL (ref 78.0–100.0)
Monocytes Absolute: 0.6 10*3/uL (ref 0.1–1.0)
Monocytes Relative: 8 %
NEUTROS ABS: 5.4 10*3/uL (ref 1.7–7.7)
NEUTROS PCT: 69 %
Platelets: 248 10*3/uL (ref 150–400)
RBC: 3.44 MIL/uL — AB (ref 4.22–5.81)
RDW: 14 % (ref 11.5–15.5)
WBC: 7.8 10*3/uL (ref 4.0–10.5)

## 2015-06-05 LAB — IRON AND TIBC
IRON: 43 ug/dL — AB (ref 45–182)
SATURATION RATIOS: 16 % — AB (ref 17.9–39.5)
TIBC: 266 ug/dL (ref 250–450)
UIBC: 223 ug/dL

## 2015-06-05 LAB — FERRITIN: FERRITIN: 51 ng/mL (ref 24–336)

## 2015-06-06 ENCOUNTER — Other Ambulatory Visit (HOSPITAL_COMMUNITY): Payer: Self-pay | Admitting: Oncology

## 2015-06-06 DIAGNOSIS — D500 Iron deficiency anemia secondary to blood loss (chronic): Secondary | ICD-10-CM

## 2015-06-06 NOTE — Progress Notes (Signed)
Labs drawn

## 2015-06-13 ENCOUNTER — Encounter (HOSPITAL_BASED_OUTPATIENT_CLINIC_OR_DEPARTMENT_OTHER): Payer: Medicaid Other

## 2015-06-13 ENCOUNTER — Encounter (HOSPITAL_COMMUNITY): Payer: Self-pay

## 2015-06-13 VITALS — BP 166/58 | HR 83 | Temp 97.6°F

## 2015-06-13 DIAGNOSIS — D500 Iron deficiency anemia secondary to blood loss (chronic): Secondary | ICD-10-CM | POA: Diagnosis not present

## 2015-06-13 DIAGNOSIS — N185 Chronic kidney disease, stage 5: Secondary | ICD-10-CM | POA: Diagnosis not present

## 2015-06-13 MED ORDER — SODIUM CHLORIDE 0.9 % IV SOLN
INTRAVENOUS | Status: DC
  Administered 2015-06-13: 09:00:00 via INTRAVENOUS

## 2015-06-13 MED ORDER — SODIUM CHLORIDE 0.9 % IV SOLN
125.0000 mg | Freq: Once | INTRAVENOUS | Status: AC
  Administered 2015-06-13: 125 mg via INTRAVENOUS
  Filled 2015-06-13: qty 10

## 2015-06-13 NOTE — Patient Instructions (Signed)
[place] at [facility] Discharge Instructions  RECOMMENDATIONS MADE BY THE CONSULTANT AND ANY TEST RESULTS WILL BE SENT TO YOUR REFERRING PHYSICIAN.  IV iron given today per orders Will see you at your next appointment.  Thank you for choosing [place] at [facility] to provide your oncology and hematology care.  To afford each patient quality time with our provider, please arrive at least 15 minutes before your scheduled appointment time.    You need to re-schedule your appointment should you arrive 10 or more minutes late.  We strive to give you quality time with our providers, and arriving late affects you and other patients whose appointments are after yours.  Also, if you no show three or more times for appointments you may be dismissed from the clinic at the providers discretion.     Again, thank you for choosing [facility].  Our hope is that these requests will decrease the amount of time that you wait before being seen by our physicians.       _____________________________________________________________  Should you have questions after your visit to [facility], please contact our office at [phone] between the hours of 8:30 a.m. and 4:30 p.m.  Voicemails left after 4:30 p.m. will not be returned until the following business day.  For prescription refill requests, have your pharmacy contact our office.

## 2015-06-13 NOTE — Progress Notes (Signed)
Patient tolerated iv iron well.

## 2015-06-19 ENCOUNTER — Other Ambulatory Visit: Payer: Self-pay

## 2015-06-19 MED ORDER — PANTOPRAZOLE SODIUM 40 MG PO TBEC: DELAYED_RELEASE_TABLET | ORAL | Status: DC

## 2015-06-19 MED ORDER — GLIPIZIDE 10 MG PO TABS: 15.0000 mg | ORAL_TABLET | Freq: Every day | ORAL | Status: DC

## 2015-06-20 ENCOUNTER — Encounter (HOSPITAL_COMMUNITY): Payer: Self-pay

## 2015-06-20 ENCOUNTER — Encounter (HOSPITAL_BASED_OUTPATIENT_CLINIC_OR_DEPARTMENT_OTHER): Payer: Medicaid Other

## 2015-06-20 VITALS — BP 165/66 | HR 79 | Temp 97.7°F | Resp 20

## 2015-06-20 DIAGNOSIS — N185 Chronic kidney disease, stage 5: Secondary | ICD-10-CM

## 2015-06-20 DIAGNOSIS — D500 Iron deficiency anemia secondary to blood loss (chronic): Secondary | ICD-10-CM | POA: Diagnosis not present

## 2015-06-20 MED ORDER — SODIUM CHLORIDE 0.9 % IV SOLN
125.0000 mg | Freq: Once | INTRAVENOUS | Status: AC
  Administered 2015-06-20: 125 mg via INTRAVENOUS
  Filled 2015-06-20: qty 10

## 2015-06-20 MED ORDER — SODIUM CHLORIDE 0.9 % IJ SOLN
10.0000 mL | Freq: Once | INTRAMUSCULAR | Status: AC
  Administered 2015-06-20: 10 mL via INTRAVENOUS

## 2015-06-20 MED ORDER — SODIUM CHLORIDE 0.9 % IV SOLN
INTRAVENOUS | Status: DC
  Administered 2015-06-20: 13:00:00 via INTRAVENOUS

## 2015-06-20 NOTE — Progress Notes (Signed)
Patient tolerated infusion well.  VSS post infusion.

## 2015-06-20 NOTE — Progress Notes (Signed)
..  [name] arrives today for iron infusion. Tolerated well

## 2015-06-20 NOTE — Patient Instructions (Signed)
..  [place] at [facility] Discharge Instructions  RECOMMENDATIONS MADE BY THE CONSULTANT AND ANY TEST RESULTS WILL BE SENT TO YOUR REFERRING PHYSICIAN.  Iron infusion today  Thank you for choosing [place] at [facility] to provide your oncology and hematology care.  To afford each patient quality time with our provider, please arrive at least 15 minutes before your scheduled appointment time.    You need to re-schedule your appointment should you arrive 10 or more minutes late.  We strive to give you quality time with our providers, and arriving late affects you and other patients whose appointments are after yours.  Also, if you no show three or more times for appointments you may be dismissed from the clinic at the providers discretion.     Again, thank you for choosing [facility].  Our hope is that these requests will decrease the amount of time that you wait before being seen by our physicians.       _____________________________________________________________  Should you have questions after your visit to [facility], please contact our office at [phone] between the hours of 8:30 a.m. and 4:30 p.m.  Voicemails left after 4:30 p.m. will not be returned until the following business day.  For prescription refill requests, have your pharmacy contact our office.

## 2015-07-02 ENCOUNTER — Encounter: Payer: Self-pay | Admitting: Gastroenterology

## 2015-07-02 ENCOUNTER — Other Ambulatory Visit: Payer: Self-pay

## 2015-07-02 ENCOUNTER — Ambulatory Visit (INDEPENDENT_AMBULATORY_CARE_PROVIDER_SITE_OTHER): Payer: Medicare Other | Admitting: Gastroenterology

## 2015-07-02 VITALS — BP 142/61 | HR 79 | Temp 97.3°F | Ht 64.0 in | Wt 176.0 lb

## 2015-07-02 DIAGNOSIS — R140 Abdominal distension (gaseous): Secondary | ICD-10-CM

## 2015-07-02 DIAGNOSIS — R110 Nausea: Secondary | ICD-10-CM | POA: Insufficient documentation

## 2015-07-02 DIAGNOSIS — R112 Nausea with vomiting, unspecified: Secondary | ICD-10-CM

## 2015-07-02 DIAGNOSIS — K3184 Gastroparesis: Secondary | ICD-10-CM | POA: Diagnosis not present

## 2015-07-02 DIAGNOSIS — K219 Gastro-esophageal reflux disease without esophagitis: Secondary | ICD-10-CM | POA: Diagnosis not present

## 2015-07-02 NOTE — Progress Notes (Signed)
Referring Provider: Sharion Balloon, FNP Primary Care Physician:  Evelina Dun, FNP  Primary GI: Dr. Oneida Alar   Chief Complaint  Patient presents with  . Constipation    HPI:   [name] is a 65 y.o. 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 EGD on file from March 2015 with mild chronic gastritis, benign gastric polyps, with capsule study noting small bowel and colonic AVMs without active bleeding. Followed by Hematology for IDA. Last colonoscopy Aug 2013 with 2 cecal AVMs without stigmata of bleeding. Weight 174 in July 2015, 176 today.   No overt GI bleeding. Feels like some food is hard to digest. Gets nauseated eating. Wakes up nauseated. No vomiting. Intermittent. No constipation. Although the chief complaint states "constipation", he denies this. His main problem is eating chicken, [place], has a sensation of feeling like it just stays in his stomach for a very long time. Has abdominal bloating. Uncomfortable. Takes Miralax with good results. Taking Protonix BID. States he feels like his abdomen swells up at times.   Past Medical History  Diagnosis Date  . Diverticulosis 03/04/10  . Peripheral vascular disease, unspecified ([place])   . Undiagnosed cardiac murmurs   . Other symptoms involving cardiovascular system   . HLD (hyperlipidemia)   . HTN (hypertension)   . Type II or unspecified type diabetes mellitus without mention of complication, not stated as uncontrolled   . Ulcer   . Carotid artery occlusion   . Umbilical hernia now    has not been repaired  . Blood transfusion   . AVM (arteriovenous malformation) of colon with hemorrhage   . Gastritis   . Internal hemorrhoids   . Right carotid bruit   . Shortness of breath     noted /w low Hgb  . GERD (gastroesophageal reflux disease)   . Chronic kidney disease     renal failure /w osteomyelitis- 2010  . Arthritis     back  . Anemia     8/3,4,5- blood transfusion- [facility], colonoscopy-  done & found 2 areas of bleeding   . Tobacco use disorder   . Anxiety   . CHF (congestive heart failure) ([place])     3 yrs ago  . Irregular heart beat   . Iron deficiency anemia due to chronic blood loss 10/07/2010    Past Surgical History  Procedure Laterality Date  . Colonoscopy  03/04/2010    Dr. Princella Pellegrini AMV without mention of ablation, scattered diverticula, internal hemorrhoids  . Bilateral common superficial femoral and profudus artery endarterectomies      2003    Dr. Sherren Mocha Early  . Esophagogastroduodenoscopy  03/2000    Dr. Tharon Aquas gastritis, H.Pylori gastritis, ?treated  . Esophagogastroduodenoscopy  05/2006    Dr. Marcello Fennel recurrent GI bleed. antral gastritis, single gastric AVM s/p APC, CLO results?  . Colonoscopy  11/2004    Dr. Barkley Bruns  . 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 Both of these are ablated with argon plasma coagulator./ Small external hemorrhoids  . Esophagogastroduodenoscopy  01/21/2012    SLF: MILD TO MAODERATE Gastritis/ SLOW GIB LIKEY DUE TO PT BEING ON ASA ND SUBSEQUENT BLOOD/ LOSS FROM AVMS IN STOMACH AND COLON AS WELL AS GASTRITIS  . Enteroscopy  01/21/2012    Procedure: ENTEROSCOPY;  Surgeon: Danie Binder, MD;  Location: [facility];  Service: Endoscopy;;  . No past surgeries    . Vascular surgery      rt bka and rt 1st toe amputation  . Below knee leg amputation      2010, wears prosthesis   . Endarterectomy  02/09/2012    Procedure: ENDARTERECTOMY CAROTID;  Surgeon: Rosetta Posner, MD;  Location: [place];  Service: Vascular;  Laterality: Left;  . Carotid endarterectomy Left 02-09-12  . Esophagogastroduodenoscopy N/A 09/03/2013    Mild chronic gastritis. benign gastric polyps  . Givens capsule study N/A 09/03/2013    Dr. Oneida Alar: small bowel and colonic AVMs. no active bleeding      Current Outpatient Prescriptions  Medication Sig Dispense Refill  . amLODipine (NORVASC) 10 MG tablet TAKE 1 TABLET BY MOUTH DAILY, BEFORE BREAKFAST 90 tablet 1  . atenolol (TENORMIN) 100 MG tablet TAKE 1 TABLET (100 MG TOTAL) BY MOUTH DAILY. 90 tablet 1  . Calcium Carbonate Antacid (TUMS ULTRA 1000 PO) Take 2 tablets by mouth daily.    . cloNIDine (CATAPRES) 0.1 MG tablet TAKE 1 TABLET BY MOUTH TWICE A DAY 60 tablet 1  . furosemide (LASIX) 40 MG tablet TAKE 1 TABLET (40 MG TOTAL) BY MOUTH AS NEEDED. 30 tablet 5  . gabapentin (NEURONTIN) 300 MG capsule Take 1 capsule (300 mg total) by mouth 3 (three) times daily. 270 capsule 3  . glipiZIDE (GLUCOTROL) 10 MG tablet TAKE 1 AND 1/2 TABLETS BY MOUTH DAILY 45 tablet 2  . glipiZIDE (GLUCOTROL) 10 MG tablet Take 1.5 tablets (15 mg total) by mouth daily. Takes one tablet in the Am and 1/2 tablet in the pm 135 tablet 0  . HYDROcodone-acetaminophen (NORCO/VICODIN) 5-325 MG tablet Take 1 tablet by mouth every 8 (eight) hours as needed. 30 tablet 0  . pantoprazole (PROTONIX) 40 MG tablet TAKE 1 TABLET BY MOUTH TWICE A DAY BEFORE MEAL 180 tablet 1  . polyethylene glycol powder (GLYCOLAX/MIRALAX) powder Take 1 Container by mouth once. Only as needed    . silver sulfADIAZINE (SILVADENE) 1 % cream APPLY TO AFFECTED AREA TWICE A DAY 240 g 1  . tamsulosin (FLOMAX) 0.4 MG CAPS capsule Take 1 capsule at bedtime nightly for urinary symptoms. 30 capsule 6  . valsartan (DIOVAN) 320 MG tablet Take 1 tablet (320 mg total) by mouth daily. 90 tablet 2   No current facility-administered medications for this visit.    Allergies as of 07/02/2015 - Review Complete 07/02/2015  Allergen Reaction Noted  . Zocor [simvastatin] Palpitations 03/07/2013  . Asa [aspirin] Other (See Comments) 03/22/2014  . Hctz [hydrochlorothiazide]  12/06/2012  . Iodine Hives 10/07/2010  . Sulfa antibiotics Other (See Comments) 10/07/2010    Family History  Problem Relation Age of Onset   . Colon cancer Brother 22    deceased  . Hyperlipidemia Brother   . Hypertension Brother   . Lung cancer Sister     deceased, three primary cancers: lung, esophageal, lymphoma.   . Diabetes Sister   . Hypertension Sister   . Cancer Father     gallbladder  . Hyperlipidemia Father   . Hypertension Father   . Cancer Brother     unknown primary  . Cancer Sister     unknown primary  . Hyperlipidemia Sister   . Diabetes Mother   . Hypertension Mother     Social History   Social History  . Marital Status: Married    Spouse Name: N/A  . Number of Children: N/A  .  Years of Education: N/A   Social History Main Topics  . Smoking status: Current Every Day Smoker -- 2.00 packs/day for 50 years    Types: Cigarettes  . Smokeless tobacco: Former Systems developer    Quit date: 02/08/2012     Comment: Smokes about 2 packs of tiny skinny cigarettes  . Alcohol Use: No  . Drug Use: No  . Sexual Activity: Not Asked   Other Topics Concern  . None   Social History Narrative    Review of Systems: As mentioned in HPI.   Physical Exam: BP 142/61 mmHg  Pulse 79  Temp(Src) 97.3 F (36.3 C) (Oral)  Ht '5\' 4"'$  (1.626 m)  Wt 176 lb (79.833 kg)  BMI 30.20 kg/m2 General:   Alert and oriented. No distress noted. Pleasant and cooperative. Somewhat sallow appearing Head:  Normocephalic and atraumatic. Heart:  S1, S2 present without murmurs, rubs, or gallops. Regular rate and rhythm. Abdomen:  +BS, soft, +FLUID WAVE, somewhat protuberant but NON-TENSE. Mild discomfort to palpation epigastric. Umbilical hernia, easily reducible.  Msk:  Right lower extremity prosthesis  Extremities:  Without edema. Neurologic:  Alert and  oriented x4;  grossly normal neurologically. Psych:  Alert and cooperative. Normal mood and affect.  Lab Results  Component Value Date   ALT 10 05/06/2015   AST 14 05/06/2015   ALKPHOS 109 05/06/2015   BILITOT 0.2 05/06/2015   Lab Results  Component Value Date   WBC 7.8  06/05/2015   HGB 10.5* 06/05/2015   HCT 32.4* 06/05/2015   MCV 94.2 06/05/2015   PLT 248 06/05/2015   Lab Results  Component Value Date   IRON 43* 06/05/2015   TIBC 266 06/05/2015   FERRITIN 51 06/05/2015   Lab Results  Component Value Date   CREATININE 3.65* 05/06/2015   BUN 34* 05/06/2015   NA 140 05/06/2015   K 5.0 05/06/2015   CL 102 05/06/2015   CO2 21 05/06/2015

## 2015-07-02 NOTE — Assessment & Plan Note (Signed)
Vague bloating symptoms but constipation managed. On physical exam, query fluid wave. Due to AP diameter, difficult to appreciate HSM. I am ordering an ultrasound to assess for occult liver disease, any ascites noted. Further management after ultrasound completed.

## 2015-07-02 NOTE — Progress Notes (Signed)
CC'ED TO PCP 

## 2015-07-02 NOTE — Assessment & Plan Note (Signed)
Persistent nausea but no vomiting. Early satiety noted. When seen in last year, I had suggested a gastric emptying study. No pain with eating or dysphagia. Question delayed stomach emptying. Continue PPI BID. Would be hesitant to give Reglan in this patient if he does have gastroparesis. Hold on Zofran until after GES. Weight is stable. Last EGD in March 2015. If persistent symptoms and negative GES, may want to consider repeat EGD.

## 2015-07-02 NOTE — Patient Instructions (Signed)
I have ordered a special stomach emptying study and an ultrasound of your liver.   Further recommendations to follow!

## 2015-07-03 DIAGNOSIS — Z89512 Acquired absence of left leg below knee: Secondary | ICD-10-CM | POA: Diagnosis not present

## 2015-07-09 ENCOUNTER — Ambulatory Visit (HOSPITAL_COMMUNITY)
Admission: RE | Admit: 2015-07-09 | Discharge: 2015-07-09 | Disposition: A | Discharge status: Home / Self Care | Payer: Medicare Other | Source: Ambulatory Visit | Attending: Gastroenterology | Admitting: Gastroenterology

## 2015-07-09 ENCOUNTER — Encounter (HOSPITAL_COMMUNITY): Payer: Medicare Other

## 2015-07-09 DIAGNOSIS — R112 Nausea with vomiting, unspecified: Secondary | ICD-10-CM | POA: Insufficient documentation

## 2015-07-09 DIAGNOSIS — J90 Pleural effusion, not elsewhere classified: Secondary | ICD-10-CM | POA: Diagnosis not present

## 2015-07-09 DIAGNOSIS — R140 Abdominal distension (gaseous): Secondary | ICD-10-CM | POA: Diagnosis not present

## 2015-07-09 DIAGNOSIS — R110 Nausea: Secondary | ICD-10-CM | POA: Diagnosis not present

## 2015-07-09 DIAGNOSIS — R109 Unspecified abdominal pain: Secondary | ICD-10-CM | POA: Diagnosis not present

## 2015-07-11 ENCOUNTER — Ambulatory Visit (INDEPENDENT_AMBULATORY_CARE_PROVIDER_SITE_OTHER): Payer: Medicare Other | Admitting: Nurse Practitioner

## 2015-07-11 ENCOUNTER — Telehealth: Payer: Self-pay

## 2015-07-11 ENCOUNTER — Encounter: Payer: Self-pay | Admitting: Nurse Practitioner

## 2015-07-11 VITALS — BP 162/94 | HR 80 | Temp 97.6°F | Ht 64.0 in | Wt 172.0 lb

## 2015-07-11 DIAGNOSIS — I10 Essential (primary) hypertension: Secondary | ICD-10-CM

## 2015-07-11 MED ORDER — CLONIDINE HCL 0.2 MG PO TABS: 0.2000 mg | ORAL_TABLET | Freq: Two times a day (BID) | ORAL | Status: DC

## 2015-07-11 NOTE — Progress Notes (Signed)
Quick Note:  No evidence of liver or gallbladder abnormality. No ascites. Await GES. ______

## 2015-07-11 NOTE — Patient Instructions (Signed)
Hypertension Hypertension, commonly called high blood pressure, is when the force of blood pumping through your arteries is too strong. Your arteries are the blood vessels that carry blood from your heart throughout your body. A blood pressure reading consists of a higher number over a lower number, such as 110/72. The higher number (systolic) is the pressure inside your arteries when your heart pumps. The lower number (diastolic) is the pressure inside your arteries when your heart relaxes. Ideally you want your blood pressure below 120/80. Hypertension forces your heart to work harder to pump blood. Your arteries may become narrow or stiff. Having untreated or uncontrolled hypertension can cause heart attack, stroke, kidney disease, and other problems. RISK FACTORS Some risk factors for high blood pressure are controllable. Others are not.  Risk factors you cannot control include:   Race. You may be at higher risk if you are African American.  Age. Risk increases with age.  Gender. Men are at higher risk than women before age 45 years. After age 65, women are at higher risk than men. Risk factors you can control include:  Not getting enough exercise or physical activity.  Being overweight.  Getting too much fat, sugar, calories, or salt in your diet.  Drinking too much alcohol. SIGNS AND SYMPTOMS Hypertension does not usually cause signs or symptoms. Extremely high blood pressure (hypertensive crisis) may cause headache, anxiety, shortness of breath, and nosebleed. DIAGNOSIS To check if you have hypertension, your health care provider will measure your blood pressure while you are seated, with your arm held at the level of your heart. It should be measured at least twice using the same arm. Certain conditions can cause a difference in blood pressure between your right and left arms. A blood pressure reading that is higher than normal on one occasion does not mean that you need treatment. If  it is not clear whether you have high blood pressure, you may be asked to return on a different day to have your blood pressure checked again. Or, you may be asked to monitor your blood pressure at home for 1 or more weeks. TREATMENT Treating high blood pressure includes making lifestyle changes and possibly taking medicine. Living a healthy lifestyle can help lower high blood pressure. You may need to change some of your habits. Lifestyle changes may include:  Following the DASH diet. This diet is high in fruits, vegetables, and whole grains. It is low in salt, red meat, and added sugars.  Keep your sodium intake below 2,300 mg per day.  Getting at least 30-45 minutes of aerobic exercise at least 4 times per week.  Losing weight if necessary.  Not smoking.  Limiting alcoholic beverages.  Learning ways to reduce stress. Your health care provider may prescribe medicine if lifestyle changes are not enough to get your blood pressure under control, and if one of the following is true:  You are 18-59 years of age and your systolic blood pressure is above 140.  You are 60 years of age or older, and your systolic blood pressure is above 150.  Your diastolic blood pressure is above 90.  You have diabetes, and your systolic blood pressure is over 140 or your diastolic blood pressure is over 90.  You have kidney disease and your blood pressure is above 140/90.  You have heart disease and your blood pressure is above 140/90. Your personal target blood pressure may vary depending on your medical conditions, your age, and other factors. HOME CARE INSTRUCTIONS    Have your blood pressure rechecked as directed by your health care provider.   Take medicines only as directed by your health care provider. Follow the directions carefully. Blood pressure medicines must be taken as prescribed. The medicine does not work as well when you skip doses. Skipping doses also puts you at risk for  problems.  Do not smoke.   Monitor your blood pressure at home as directed by your health care provider. SEEK MEDICAL CARE IF:   You think you are having a reaction to medicines taken.  You have recurrent headaches or feel dizzy.  You have swelling in your ankles.  You have trouble with your vision. SEEK IMMEDIATE MEDICAL CARE IF:  You develop a severe headache or confusion.  You have unusual weakness, numbness, or feel faint.  You have severe chest or abdominal pain.  You vomit repeatedly.  You have trouble breathing. MAKE SURE YOU:   Understand these instructions.  Will watch your condition.  Will get help right away if you are not doing well or get worse.   This information is not intended to replace advice given to you by your health care provider. Make sure you discuss any questions you have with your health care provider.   Document Released: 06/07/2005 Document Revised: 10/22/2014 Document Reviewed: 03/30/2013 Elsevier Interactive Patient Education 2016 Elsevier Inc.  

## 2015-07-11 NOTE — Telephone Encounter (Signed)
Wife of pt called wanting results of [place]

## 2015-07-11 NOTE — Progress Notes (Signed)
   Subjective:    Patient ID: [name], male    DOB: 1950-11-09, 65 y.o.   MRN: [id number]  HPI Patient woke up at 3am to go to restroom and when he came back to bed he did not feel good so wife took his blood pressure and it was 189/110. Not uncommon for his blood pressure to fluctuate. He is currently on amlodipine '10mg'$ , clonidine0.'1mg'$  and atenolol '50mg'$  at nighttime and diovan '320mg'$  atenolol '50mg'$  and clonidine 0.'1mg'$  in morning. When his blood pressure is high it is usually at nighttime. Never has in low readings. Wife says that it is never below 140 when they check it at home. Will get slight headache when blood pressure is up. Denies Chest pain and SOB.      Review of Systems  Constitutional: Negative.   HENT: Negative.   Respiratory: Negative for chest tightness and shortness of breath.   Cardiovascular: Negative for chest pain and leg swelling.  Gastrointestinal: Negative.   Genitourinary: Negative.   Musculoskeletal: Negative.   Psychiatric/Behavioral: Negative.   All other systems reviewed and are negative.      Objective:   Physical Exam  Constitutional: He is oriented to person, place, and time. He appears well-developed and well-nourished. No distress.  Neurological: He is alert and oriented to person, place, and time.  Skin: Skin is warm.  Psychiatric: He has a normal mood and affect. His behavior is normal. Thought content normal.    BP 162/94 mmHg  Pulse 80  Temp(Src) 97.6 F (36.4 C) (Oral)  Ht '5\' 4"'$  (1.626 m)  Wt 172 lb (78.019 kg)  BMI 29.51 kg/m2       Assessment & Plan:  1. Essential hypertension Changed clonidine from 0.1 BID to 0.'2mg'$  BID Told to keep check of blood pressure at home and call if drooping below 258 systolic - cloNIDine (CATAPRES) 0.2 MG tablet; Take 1 tablet (0.2 mg total) by mouth 2 (two) times daily.  Dispense: 60 tablet; Refill: [place], FNP

## 2015-07-11 NOTE — Telephone Encounter (Signed)
See result note.  

## 2015-07-14 NOTE — Telephone Encounter (Signed)
Routing to DS. This is an [facility] pt.

## 2015-07-14 NOTE — Progress Notes (Signed)
Quick Note:  Pt's wife called and was informed. She said he was scheduled to take the GES but was hard headed and did not do it. ______

## 2015-07-14 NOTE — Progress Notes (Signed)
Quick Note:  LMOM to call. ______ 

## 2015-07-14 NOTE — Telephone Encounter (Signed)
See result note.  

## 2015-07-15 ENCOUNTER — Telehealth (HOSPITAL_COMMUNITY): Payer: Self-pay | Admitting: *Deleted

## 2015-07-16 ENCOUNTER — Other Ambulatory Visit (HOSPITAL_COMMUNITY): Payer: Self-pay | Admitting: Oncology

## 2015-07-16 ENCOUNTER — Other Ambulatory Visit (HOSPITAL_COMMUNITY): Payer: Medicare Other

## 2015-07-16 NOTE — Telephone Encounter (Signed)
He will need to come in for lab work first.  Robynn Pane, Hershal Coria 07/16/2015 2:19 PM

## 2015-07-17 ENCOUNTER — Other Ambulatory Visit (HOSPITAL_COMMUNITY): Payer: Self-pay | Admitting: Oncology

## 2015-07-17 ENCOUNTER — Encounter (HOSPITAL_COMMUNITY): Payer: Medicare Other | Attending: Hematology & Oncology

## 2015-07-17 ENCOUNTER — Encounter (HOSPITAL_BASED_OUTPATIENT_CLINIC_OR_DEPARTMENT_OTHER): Payer: Medicare Other

## 2015-07-17 ENCOUNTER — Telehealth (HOSPITAL_COMMUNITY): Payer: Self-pay | Admitting: *Deleted

## 2015-07-17 DIAGNOSIS — D500 Iron deficiency anemia secondary to blood loss (chronic): Secondary | ICD-10-CM

## 2015-07-17 LAB — CBC WITH DIFFERENTIAL/PLATELET
Basophils Absolute: 0 10*3/uL (ref 0.0–0.1)
Basophils Relative: 0 %
Eosinophils Absolute: 0.4 10*3/uL (ref 0.0–0.7)
Eosinophils Relative: 6 %
HEMATOCRIT: 19.8 % — AB (ref 39.0–52.0)
HEMOGLOBIN: 6.5 g/dL — AB (ref 13.0–17.0)
LYMPHS ABS: 0.8 10*3/uL (ref 0.7–4.0)
Lymphocytes Relative: 13 %
MCH: 30.7 pg (ref 26.0–34.0)
MCHC: 32.8 g/dL (ref 30.0–36.0)
MCV: 93.4 fL (ref 78.0–100.0)
MONO ABS: 0.5 10*3/uL (ref 0.1–1.0)
MONOS PCT: 9 %
NEUTROS ABS: 4.6 10*3/uL (ref 1.7–7.7)
NEUTROS PCT: 73 %
Platelets: 234 10*3/uL (ref 150–400)
RBC: 2.12 MIL/uL — ABNORMAL LOW (ref 4.22–5.81)
RDW: 15.2 % (ref 11.5–15.5)
WBC: 6.3 10*3/uL (ref 4.0–10.5)

## 2015-07-17 LAB — FERRITIN: Ferritin: 29 ng/mL (ref 24–336)

## 2015-07-17 LAB — PREPARE RBC (CROSSMATCH)

## 2015-07-17 NOTE — Progress Notes (Signed)
..  Hall Busing R Abplanalp's reason for visit today is for labs as scheduled per MD orders.  Venipuncture performed with a 23 gauge butterfly needle to R Antecubital.  Hall Busing R Makin tolerated procedure well and without incident; questions were answered and patient was discharged.

## 2015-07-17 NOTE — Telephone Encounter (Signed)
..  CRITICAL VALUE ALERT Critical value received:  hgb 6.5 Date of notification:  07/17/2015 Time of notification: 4401 Critical value read back:  Yes.   Nurse who received alert:Tar MD notified (1st page):  T.Kefalas PA-C

## 2015-07-18 ENCOUNTER — Ambulatory Visit (HOSPITAL_COMMUNITY): Payer: Medicare Other

## 2015-07-18 ENCOUNTER — Encounter (HOSPITAL_BASED_OUTPATIENT_CLINIC_OR_DEPARTMENT_OTHER): Payer: Medicare Other

## 2015-07-18 ENCOUNTER — Encounter (HOSPITAL_COMMUNITY): Payer: Self-pay

## 2015-07-18 VITALS — BP 126/52 | HR 70 | Temp 98.2°F | Resp 16

## 2015-07-18 DIAGNOSIS — D500 Iron deficiency anemia secondary to blood loss (chronic): Secondary | ICD-10-CM | POA: Diagnosis not present

## 2015-07-18 MED ORDER — SODIUM CHLORIDE 0.9 % IV SOLN
510.0000 mg | Freq: Once | INTRAVENOUS | Status: AC
  Administered 2015-07-18: 510 mg via INTRAVENOUS
  Filled 2015-07-18: qty 17

## 2015-07-18 MED ORDER — SODIUM CHLORIDE 0.9% FLUSH
10.0000 mL | INTRAVENOUS | Status: AC | PRN
  Administered 2015-07-18: 10 mL

## 2015-07-18 MED ORDER — ACETAMINOPHEN 325 MG PO TABS
650.0000 mg | ORAL_TABLET | Freq: Once | ORAL | Status: AC
  Administered 2015-07-18: 650 mg via ORAL

## 2015-07-18 MED ORDER — DIPHENHYDRAMINE HCL 25 MG PO CAPS
25.0000 mg | ORAL_CAPSULE | Freq: Once | ORAL | Status: AC
  Administered 2015-07-18: 25 mg via ORAL

## 2015-07-18 MED ORDER — ACETAMINOPHEN 325 MG PO TABS
ORAL_TABLET | ORAL | Status: AC
  Filled 2015-07-18: qty 2

## 2015-07-18 MED ORDER — DIPHENHYDRAMINE HCL 25 MG PO CAPS
ORAL_CAPSULE | ORAL | Status: AC
  Filled 2015-07-18: qty 1

## 2015-07-18 MED ORDER — SODIUM CHLORIDE 0.9 % IV SOLN
250.0000 mL | Freq: Once | INTRAVENOUS | Status: AC
  Administered 2015-07-18: 250 mL via INTRAVENOUS

## 2015-07-18 NOTE — Progress Notes (Signed)
..  [name] tolerated blood transfusion and feraheme today without problems. Stool cards x 3 given.

## 2015-07-18 NOTE — Patient Instructions (Signed)
..  [place] at [facility] Discharge Instructions  RECOMMENDATIONS MADE BY THE CONSULTANT AND ANY TEST RESULTS WILL BE SENT TO YOUR REFERRING PHYSICIAN.  Blood transfusion today Stool cards x 3 Return in 3 weeks for labs   Thank you for choosing [place] at [facility] to provide your oncology and hematology care.  To afford each patient quality time with our provider, please arrive at least 15 minutes before your scheduled appointment time.   Beginning January 23rd 2017 lab work for the [facility] will be done in the  Main lab at [facility] on 1st floor. If you have a lab appointment with the [place] please come in thru the  Main Entrance and check in at the main information desk  You need to re-schedule your appointment should you arrive 10 or more minutes late.  We strive to give you quality time with our providers, and arriving late affects you and other patients whose appointments are after yours.  Also, if you no show three or more times for appointments you may be dismissed from the clinic at the providers discretion.     Again, thank you for choosing [facility].  Our hope is that these requests will decrease the amount of time that you wait before being seen by our physicians.       _____________________________________________________________  Should you have questions after your visit to [facility], please contact our office at [phone] between the hours of 8:30 a.m. and 4:30 p.m.  Voicemails left after 4:30 p.m. will not be returned until the following business day.  For prescription refill requests, have your pharmacy contact our office.

## 2015-07-19 LAB — TYPE AND SCREEN
ABO/RH(D): O POS
ANTIBODY SCREEN: NEGATIVE
UNIT DIVISION: 0
Unit division: 0

## 2015-07-21 ENCOUNTER — Other Ambulatory Visit (HOSPITAL_COMMUNITY): Payer: Self-pay | Admitting: Emergency Medicine

## 2015-07-21 DIAGNOSIS — D500 Iron deficiency anemia secondary to blood loss (chronic): Secondary | ICD-10-CM | POA: Diagnosis not present

## 2015-07-21 LAB — OCCULT BLOOD X 1 CARD TO LAB, STOOL
FECAL OCCULT BLD: NEGATIVE
FECAL OCCULT BLD: NEGATIVE
Fecal Occult Bld: NEGATIVE

## 2015-07-22 ENCOUNTER — Ambulatory Visit: Payer: Medicare Other | Admitting: Internal Medicine

## 2015-07-24 ENCOUNTER — Encounter: Payer: Self-pay | Admitting: Internal Medicine

## 2015-07-24 NOTE — Progress Notes (Signed)
APPT MADE AND LETTER SENT  °

## 2015-07-24 NOTE — Progress Notes (Signed)
Quick Note:  I spoke to pt's wife and she asked pt and he said he did not want to do it. She is aware he needs to return in 3 months.  Routing to [place] to nic/schedule. ______

## 2015-07-24 NOTE — Progress Notes (Signed)
Quick Note:  Please arrange GES if patient is willing. Otherwise, return in 3 months. ______

## 2015-07-25 ENCOUNTER — Encounter (HOSPITAL_COMMUNITY): Payer: Medicaid Other | Attending: Hematology & Oncology

## 2015-07-25 VITALS — BP 167/62 | HR 78 | Temp 97.7°F | Resp 16

## 2015-07-25 DIAGNOSIS — D500 Iron deficiency anemia secondary to blood loss (chronic): Secondary | ICD-10-CM | POA: Diagnosis not present

## 2015-07-25 MED ORDER — SODIUM CHLORIDE 0.9 % IV SOLN
510.0000 mg | Freq: Once | INTRAVENOUS | Status: AC
  Administered 2015-07-25: 510 mg via INTRAVENOUS
  Filled 2015-07-25: qty 17

## 2015-07-25 MED ORDER — SODIUM CHLORIDE 0.9% FLUSH
10.0000 mL | Freq: Once | INTRAVENOUS | Status: AC
  Administered 2015-07-25: 10 mL via INTRAVENOUS

## 2015-07-25 MED ORDER — SODIUM CHLORIDE 0.9 % IV SOLN
INTRAVENOUS | Status: DC
  Administered 2015-07-25: 14:00:00 via INTRAVENOUS

## 2015-07-25 NOTE — Progress Notes (Signed)
Tolerated iron infusion well. Ambulatory on discharge home to self. 

## 2015-07-25 NOTE — Patient Instructions (Signed)
[place] at [facility] Discharge Instructions  RECOMMENDATIONS MADE BY THE CONSULTANT AND ANY TEST RESULTS WILL BE SENT TO YOUR REFERRING PHYSICIAN.  Feraheme 510 mg iron infusion given as ordered. Return as scheduled.  Thank you for choosing [place] at [facility] to provide your oncology and hematology care.  To afford each patient quality time with our provider, please arrive at least 15 minutes before your scheduled appointment time.   Beginning January 23rd 2017 lab work for the [facility] will be done in the  Main lab at [facility] on 1st floor. If you have a lab appointment with the [facility] please come in thru the  Main Entrance and check in at the main information desk  You need to re-schedule your appointment should you arrive 10 or more minutes late.  We strive to give you quality time with our providers, and arriving late affects you and other patients whose appointments are after yours.  Also, if you no show three or more times for appointments you may be dismissed from the clinic at the providers discretion.     Again, thank you for choosing [facility].  Our hope is that these requests will decrease the amount of time that you wait before being seen by our physicians.       _____________________________________________________________  Should you have questions after your visit to [facility], please contact our office at [phone] between the hours of 8:30 a.m. and 4:30 p.m.  Voicemails left after 4:30 p.m. will not be returned until the following business day.  For prescription refill requests, have your pharmacy contact our office.

## 2015-07-30 DIAGNOSIS — Z89511 Acquired absence of right leg below knee: Secondary | ICD-10-CM | POA: Diagnosis not present

## 2015-08-01 ENCOUNTER — Ambulatory Visit (HOSPITAL_COMMUNITY): Payer: Medicare Other

## 2015-08-06 ENCOUNTER — Ambulatory Visit: Payer: Medicare Other | Admitting: Family

## 2015-08-08 ENCOUNTER — Ambulatory Visit: Payer: Medicaid Other | Admitting: Internal Medicine

## 2015-08-11 ENCOUNTER — Encounter (HOSPITAL_COMMUNITY): Payer: Medicaid Other

## 2015-08-11 DIAGNOSIS — D500 Iron deficiency anemia secondary to blood loss (chronic): Secondary | ICD-10-CM | POA: Diagnosis present

## 2015-08-11 LAB — CBC
HCT: 31.6 % — ABNORMAL LOW (ref 39.0–52.0)
Hemoglobin: 10.2 g/dL — ABNORMAL LOW (ref 13.0–17.0)
MCH: 30 pg (ref 26.0–34.0)
MCHC: 32.3 g/dL (ref 30.0–36.0)
MCV: 92.9 fL (ref 78.0–100.0)
Platelets: 207 K/uL (ref 150–400)
RBC: 3.4 MIL/uL — ABNORMAL LOW (ref 4.22–5.81)
RDW: 15.3 % (ref 11.5–15.5)
WBC: 6 K/uL (ref 4.0–10.5)

## 2015-08-11 LAB — FERRITIN: Ferritin: 250 ng/mL (ref 24–336)

## 2015-08-13 ENCOUNTER — Telehealth: Payer: Self-pay | Admitting: Gastroenterology

## 2015-08-13 NOTE — Telephone Encounter (Signed)
I spoke to the pt's wife and she said he is having a very difficult time digesting food. She is aware of the pt's appt on 08/18/2015. Routing to Dr. Oneida Alar in Anna's absence today, to see if there are any recommendations before the appt.

## 2015-08-13 NOTE — Telephone Encounter (Signed)
Pt's wife called asking if there was anything we could call into pharmacy that would help his food digest. Pt did not want to wait until May appointment and is aware of OV on 2/27 at 230 with AS. Please advise

## 2015-08-14 NOTE — Telephone Encounter (Signed)
CALLED PT'S WIFE TO DISCUSS SYMPTOMS. I have attempted without success to contact this patient by phone. LEFT VOICEMAIL TO CALL TO DISCUSS.

## 2015-08-15 MED ORDER — ONDANSETRON HCL 4 MG PO TABS: 4.0000 mg | ORAL_TABLET | Freq: Three times a day (TID) | ORAL | Status: DC

## 2015-08-15 NOTE — Addendum Note (Signed)
Addended by: Everardo All on: 08/15/2015 10:24 AM   Modules accepted: Medications

## 2015-08-15 NOTE — Telephone Encounter (Signed)
Ferritin was dropping, Hgb 6.5, 4 weeks ago and got 2 units PRBCs. This is concerning. He will need an EGD for sure due to upper GI symptoms. Last colonoscopy in 2013 I believe, and he would benefit from this but he may not be able to tolerate the prep. For now, drink plenty of liquids, use boost/ensure for supplemental nutrition, eat soft foods. If unable to tolerate food, needs to seek medical attention. If he can make it to the 27th, I will be setting up procedures ASAP at that time.   Adding Zofran scheduled with meals for now. Continue Protonix daily.

## 2015-08-15 NOTE — Addendum Note (Signed)
Addended by: Orvil Feil on: 08/15/2015 09:47 AM   Modules accepted: Orders

## 2015-08-15 NOTE — Telephone Encounter (Signed)
Pt's wife called. She said that the pt is having more and more problems swallowing his food. Then after he swallows he cannot digest the food.  Has difficulty with his BM's also.  [name] he is just miserable most of the time.  Sending to Neil Crouch, PA to advise in Dr. Artis Flock absence.

## 2015-08-15 NOTE — Telephone Encounter (Signed)
Sent to Vicente Males who saw patient last month.

## 2015-08-15 NOTE — Telephone Encounter (Signed)
Pt's wife is aware. He will keep appt on Monday and seek medical attention if he worsens.

## 2015-08-18 ENCOUNTER — Encounter: Payer: Self-pay | Admitting: Gastroenterology

## 2015-08-18 ENCOUNTER — Other Ambulatory Visit: Payer: Self-pay

## 2015-08-18 ENCOUNTER — Ambulatory Visit (INDEPENDENT_AMBULATORY_CARE_PROVIDER_SITE_OTHER): Payer: Medicare Other | Admitting: Gastroenterology

## 2015-08-18 VITALS — BP 158/75 | HR 76 | Temp 96.9°F | Ht 64.0 in | Wt 156.8 lb

## 2015-08-18 DIAGNOSIS — R1013 Epigastric pain: Secondary | ICD-10-CM

## 2015-08-18 DIAGNOSIS — R1310 Dysphagia, unspecified: Secondary | ICD-10-CM | POA: Insufficient documentation

## 2015-08-18 DIAGNOSIS — K5900 Constipation, unspecified: Secondary | ICD-10-CM | POA: Diagnosis not present

## 2015-08-18 DIAGNOSIS — D649 Anemia, unspecified: Secondary | ICD-10-CM

## 2015-08-18 NOTE — Progress Notes (Signed)
Referring Provider: Sharion Balloon, FNP Primary Care Physician:  Evelina Dun, FNP  Primary GI: Dr. Oneida Alar   Chief Complaint  Patient presents with  . set up EGD    HPI:   [name] is a 65 y.o. 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 EGD on file from March 2015 with mild chronic gastritis, benign gastric polyps, with capsule study noting small bowel and colonic AVMs without active bleeding. Followed by Hematology for IDA. Last colonoscopy Aug 2013 with 2 cecal AVMs without stigmata of bleeding. Weight 174 in July 2015, 176 in Jan 2017 at last visit, and today he is 156. In interim from last visit, his Hgb dropped from the baseline of 10/11 to 6.5 without overt GI bleeding. His ferritin had dropped as well. He received 2 units PRBCs and 2 doses of feraheme per oncology. Hemoccult negative X 3. Gastric emptying study had been recommended on several occasions, and it was scheduled in Jan 2017. He did not show for this. US abdomen Jan 2017 without acute findings. Presents today to discuss EGD.   Had a small amount of milk and 4 donut holes today. No appetite. States he can't digest food. Has to take something for constipation. Feels full with small amounts of food. Will get choked with solid foods. Associated nausea. Zofran has helped with nausea. Has lost close to 20 lbs since Jan 2017. Seeing Dr. Lowanda Foster for renal disease. May be going on dialysis in the future. Miralax not helping. Had to try Milk of Magnesia. No melena or hematochezia.   Past Medical History  Diagnosis Date  . Diverticulosis 03/04/10  . Peripheral vascular disease, unspecified ([place])   . Undiagnosed cardiac murmurs   . Other symptoms involving cardiovascular system   . HLD (hyperlipidemia)   . HTN (hypertension)   . Type II or unspecified type diabetes mellitus without mention of complication, not stated as uncontrolled   . Ulcer   . Carotid artery occlusion   . Umbilical  hernia now    has not been repaired  . Blood transfusion   . AVM (arteriovenous malformation) of colon with hemorrhage   . Gastritis   . Internal hemorrhoids   . Right carotid bruit   . Shortness of breath     noted /w low Hgb  . GERD (gastroesophageal reflux disease)   . Chronic kidney disease     renal failure /w osteomyelitis- 2010  . Arthritis     back  . Anemia     8/3,4,5- blood transfusion- [facility], colonoscopy- done & found 2 areas of bleeding   . Tobacco use disorder   . Anxiety   . CHF (congestive heart failure) ([place])     3 yrs ago  . Irregular heart beat   . Iron deficiency anemia due to chronic blood loss 10/07/2010    Past Surgical History  Procedure Laterality Date  . Colonoscopy  03/04/2010    Dr. Princella Pellegrini AMV without mention of ablation, scattered diverticula, internal hemorrhoids  . Bilateral common superficial femoral and profudus artery endarterectomies      2003    Dr. Sherren Mocha Early  . Esophagogastroduodenoscopy  03/2000    Dr. Tharon Aquas gastritis, H.Pylori gastritis, ?treated  . Esophagogastroduodenoscopy  05/2006    Dr. Marcello Fennel recurrent GI bleed. antral gastritis, single gastric AVM s/p APC, CLO results?  . Colonoscopy  11/2004    Dr. Barkley Bruns  . 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 Both of these are ablated with argon plasma coagulator./ Small external hemorrhoids  . Esophagogastroduodenoscopy  01/21/2012    SLF: MILD TO MAODERATE Gastritis/ SLOW GIB LIKEY DUE TO PT BEING ON ASA ND SUBSEQUENT BLOOD/ LOSS FROM AVMS IN STOMACH AND COLON AS WELL AS GASTRITIS  . Enteroscopy  01/21/2012    Procedure: ENTEROSCOPY;  Surgeon: Danie Binder, MD;  Location: [facility];  Service: Endoscopy;;  . No past surgeries    . Vascular surgery      rt bka and rt 1st toe amputation  . Below knee leg  amputation      2010, wears prosthesis   . Endarterectomy  02/09/2012    Procedure: ENDARTERECTOMY CAROTID;  Surgeon: Rosetta Posner, MD;  Location: [place];  Service: Vascular;  Laterality: Left;  . Carotid endarterectomy Left 02-09-12  . Esophagogastroduodenoscopy N/A 09/03/2013    Mild chronic gastritis. benign gastric polyps  . Givens capsule study N/A 09/03/2013    Dr. Oneida Alar: small bowel and colonic AVMs. no active bleeding    Current Outpatient Prescriptions  Medication Sig Dispense Refill  . amLODipine (NORVASC) 10 MG tablet TAKE 1 TABLET BY MOUTH DAILY, BEFORE BREAKFAST 90 tablet 1  . atenolol (TENORMIN) 100 MG tablet TAKE 1 TABLET (100 MG TOTAL) BY MOUTH DAILY. 90 tablet 1  . Calcium Carbonate Antacid (TUMS ULTRA 1000 PO) Take 2 tablets by mouth daily.    . cloNIDine (CATAPRES) 0.2 MG tablet Take 1 tablet (0.2 mg total) by mouth 2 (two) times daily. 60 tablet 3  . furosemide (LASIX) 40 MG tablet TAKE 1 TABLET (40 MG TOTAL) BY MOUTH AS NEEDED. 30 tablet 5  . gabapentin (NEURONTIN) 300 MG capsule Take 1 capsule (300 mg total) by mouth 3 (three) times daily. 270 capsule 3  . glipiZIDE (GLUCOTROL) 10 MG tablet Take 1.5 tablets (15 mg total) by mouth daily. Takes one tablet in the Am and 1/2 tablet in the pm 135 tablet 0  . HYDROcodone-acetaminophen (NORCO/VICODIN) 5-325 MG tablet Take 1 tablet by mouth every 8 (eight) hours as needed. 30 tablet 0  . ondansetron (ZOFRAN) 4 MG tablet Take 1 tablet (4 mg total) by mouth 4 (four) times daily -  before meals and at bedtime. 120 tablet 1  . pantoprazole (PROTONIX) 40 MG tablet TAKE 1 TABLET BY MOUTH TWICE A DAY BEFORE MEAL 180 tablet 1  . polyethylene glycol powder (GLYCOLAX/MIRALAX) powder Take 1 Container by mouth once. Only as needed    . silver sulfADIAZINE (SILVADENE) 1 % cream APPLY TO AFFECTED AREA TWICE A DAY 240 g 1  . tamsulosin (FLOMAX) 0.4 MG CAPS capsule Take 1 capsule at bedtime nightly for urinary symptoms. 30 capsule 6  . valsartan  (DIOVAN) 320 MG tablet Take 1 tablet (320 mg total) by mouth daily. 90 tablet 2   No current facility-administered medications for this visit.    Allergies as of 08/18/2015 - Review Complete 08/18/2015  Allergen Reaction Noted  . Zocor [simvastatin] Palpitations 03/07/2013  . Asa [aspirin] Other (See Comments) 03/22/2014  . Hctz [hydrochlorothiazide]  12/06/2012  . Iodine Hives 10/07/2010  . Sulfa antibiotics Other (See Comments) 10/07/2010    Family History  Problem Relation Age of Onset  . Colon cancer Brother 18    deceased  . Hyperlipidemia Brother   . Hypertension Brother   . Lung  cancer Sister     deceased, three primary cancers: lung, esophageal, lymphoma.   . Diabetes Sister   . Hypertension Sister   . Cancer Father     gallbladder  . Hyperlipidemia Father   . Hypertension Father   . Cancer Brother     unknown primary  . Cancer Sister     unknown primary  . Hyperlipidemia Sister   . Diabetes Mother   . Hypertension Mother     Social History   Social History  . Marital Status: Married    Spouse Name: N/A  . Number of Children: N/A  . Years of Education: N/A   Social History Main Topics  . Smoking status: Current Every Day Smoker -- 2.00 packs/day for 50 years    Types: Cigarettes  . Smokeless tobacco: Former Systems developer    Quit date: 02/08/2012     Comment: Smokes about 2 packs of tiny skinny cigarettes  . Alcohol Use: No  . Drug Use: No  . Sexual Activity: Not Asked   Other Topics Concern  . None   Social History Narrative    Review of Systems: Gen: Denies fever, chills, anorexia. Denies fatigue, weakness, weight loss.  CV: Denies chest pain, palpitations, syncope, peripheral edema, and claudication. Resp: Denies dyspnea at rest, cough, wheezing, coughing up blood, and pleurisy. GI: see HPI  Derm: Denies rash, itching, dry skin Psych: Denies depression, anxiety, memory loss, confusion. No homicidal or suicidal ideation.  Heme: Denies bruising,  bleeding, and enlarged lymph nodes.  Physical Exam: BP 158/75 mmHg  Pulse 76  Temp(Src) 96.9 F (36.1 C)  Ht '5\' 4"'$  (1.626 m)  Wt 156 lb 12.8 oz (71.124 kg)  BMI 26.90 kg/m2 General:   Alert and oriented. No distress noted. Pleasant and cooperative.  Head:  Normocephalic and atraumatic. Eyes:  Conjuctiva clear without scleral icterus. Mouth:  Oral mucosa pink and moist. Good dentition. No lesions. Heart:  S1, S2 present without murmurs, rubs, or gallops. Regular rate and rhythm. Abdomen:  +BS, soft, non-tender and non-distended. No rebound or guarding. No HSM or masses noted. Msk:  Right AKA with prosthesis  Extremities:  Without edema. Neurologic:  Alert and  oriented x4;  grossly normal neurologically. Psych:  Alert and cooperative. Normal mood and affect.  Lab Results  Component Value Date   WBC 6.0 08/11/2015   HGB 10.2* 08/11/2015   HCT 31.6* 08/11/2015   MCV 92.9 08/11/2015   PLT 207 08/11/2015

## 2015-08-18 NOTE — Assessment & Plan Note (Signed)
Worsening anemia requiring transfusion. Hgb now returned to baseline. Remains hemoccult negative and without overt GI bleeding. EGD recommended as first step. Would have low threshold for updated colonoscopy if persistent IDA and/or no obvious etiology on upper endoscopy (having significant upper GI symptoms currently). He DOES have a family history of colon cancer in brother who succumbed to the disease.

## 2015-08-18 NOTE — Patient Instructions (Signed)
We have scheduled you for an upper endoscopy with possible dilation with Dr. Oneida Alar.  Continue the Zofran as needed for nausea.  I have given you samples for constipation to take INSTEAD OF Miralax. Take Linzess 1 capsule each morning, 30 minutes before breakfast. You may have some loose stool for a few days but it should get better. If not, call me.

## 2015-08-18 NOTE — Assessment & Plan Note (Signed)
65 year old male with poor appetite, nausea, early satiety, and weight loss of 20 lbs since Jan 2017. Last EGD in March 2015 with mild chronic gastritis, benign gastric polyps. 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 He has been followed by hematology. Most recently, had acute drop in Hgb to 6.5 range and dropping ferritin. He received 2 units PRBCs and feraheme IV X 2 by hematology. His hemoccults have remained negative and he is without overt GI bleeding. As he has had acute on chronic anemia and worsening upper GI symptoms, I feel it warrants an upper endoscopy to start. A GES has been recommended on several prior occasions, but he has declined this. Nausea and early satiety could be secondary to delayed stomach emptying; however, his worsening IDA is concerning. Need to rule out occult malignancy of upper GI tract. Also notable solid food dysphagia.   Proceed with upper endoscopy +/- dilation in the near future with Dr. Oneida Alar. The risks, benefits, and alternatives have been discussed in detail with patient. They have stated understanding and desire to proceed.  Phenergan 12.5 mg IV on call

## 2015-08-18 NOTE — Assessment & Plan Note (Signed)
Start Linzess 145 mcg daily. Samples provided. Patient to call for prescription.

## 2015-08-19 ENCOUNTER — Encounter (HOSPITAL_COMMUNITY)
Admission: RE | Disposition: A | Discharge status: Home / Self Care | Payer: Self-pay | Source: Ambulatory Visit | Attending: Gastroenterology

## 2015-08-19 ENCOUNTER — Ambulatory Visit (HOSPITAL_COMMUNITY)
Admission: RE | Admit: 2015-08-19 | Discharge: 2015-08-19 | Disposition: A | Discharge status: Home / Self Care | Payer: Medicare Other | Source: Ambulatory Visit | Attending: Gastroenterology | Admitting: Gastroenterology

## 2015-08-19 ENCOUNTER — Encounter (HOSPITAL_COMMUNITY): Payer: Self-pay | Admitting: *Deleted

## 2015-08-19 DIAGNOSIS — E1122 Type 2 diabetes mellitus with diabetic chronic kidney disease: Secondary | ICD-10-CM | POA: Insufficient documentation

## 2015-08-19 DIAGNOSIS — K922 Gastrointestinal hemorrhage, unspecified: Secondary | ICD-10-CM | POA: Diagnosis not present

## 2015-08-19 DIAGNOSIS — Z7984 Long term (current) use of oral hypoglycemic drugs: Secondary | ICD-10-CM | POA: Diagnosis not present

## 2015-08-19 DIAGNOSIS — Q2733 Arteriovenous malformation of digestive system vessel: Secondary | ICD-10-CM | POA: Insufficient documentation

## 2015-08-19 DIAGNOSIS — E785 Hyperlipidemia, unspecified: Secondary | ICD-10-CM | POA: Diagnosis not present

## 2015-08-19 DIAGNOSIS — N189 Chronic kidney disease, unspecified: Secondary | ICD-10-CM | POA: Insufficient documentation

## 2015-08-19 DIAGNOSIS — D509 Iron deficiency anemia, unspecified: Secondary | ICD-10-CM | POA: Insufficient documentation

## 2015-08-19 DIAGNOSIS — Z79899 Other long term (current) drug therapy: Secondary | ICD-10-CM | POA: Diagnosis not present

## 2015-08-19 DIAGNOSIS — K219 Gastro-esophageal reflux disease without esophagitis: Secondary | ICD-10-CM | POA: Insufficient documentation

## 2015-08-19 DIAGNOSIS — I129 Hypertensive chronic kidney disease with stage 1 through stage 4 chronic kidney disease, or unspecified chronic kidney disease: Secondary | ICD-10-CM | POA: Insufficient documentation

## 2015-08-19 DIAGNOSIS — R1310 Dysphagia, unspecified: Secondary | ICD-10-CM | POA: Diagnosis not present

## 2015-08-19 DIAGNOSIS — K2970 Gastritis, unspecified, without bleeding: Secondary | ICD-10-CM | POA: Diagnosis not present

## 2015-08-19 DIAGNOSIS — M1991 Primary osteoarthritis, unspecified site: Secondary | ICD-10-CM | POA: Diagnosis not present

## 2015-08-19 DIAGNOSIS — R1013 Epigastric pain: Secondary | ICD-10-CM

## 2015-08-19 HISTORY — PX: SAVORY DILATION: SHX5439

## 2015-08-19 HISTORY — PX: ESOPHAGOGASTRODUODENOSCOPY: SHX5428

## 2015-08-19 SURGERY — EGD (ESOPHAGOGASTRODUODENOSCOPY)
Anesthesia: Moderate Sedation

## 2015-08-19 MED ORDER — PROMETHAZINE HCL 25 MG/ML IJ SOLN
12.5000 mg | Freq: Once | INTRAMUSCULAR | Status: AC
  Administered 2015-08-19: 12.5 mg via INTRAVENOUS

## 2015-08-19 MED ORDER — MIDAZOLAM HCL 5 MG/5ML IJ SOLN
INTRAMUSCULAR | Status: AC
  Filled 2015-08-19: qty 5

## 2015-08-19 MED ORDER — SODIUM CHLORIDE 0.9% FLUSH
INTRAVENOUS | Status: AC
  Filled 2015-08-19: qty 10

## 2015-08-19 MED ORDER — FENTANYL CITRATE (PF) 100 MCG/2ML IJ SOLN
INTRAMUSCULAR | Status: DC | PRN
  Administered 2015-08-19: 50 ug via INTRAVENOUS

## 2015-08-19 MED ORDER — FENTANYL CITRATE (PF) 100 MCG/2ML IJ SOLN
INTRAMUSCULAR | Status: AC
  Filled 2015-08-19: qty 2

## 2015-08-19 MED ORDER — LIDOCAINE VISCOUS 2 % MT SOLN
OROMUCOSAL | Status: DC | PRN
Start: 2015-08-19 — End: 2015-08-19
  Administered 2015-08-19: 1 via OROMUCOSAL

## 2015-08-19 MED ORDER — MIDAZOLAM HCL 5 MG/5ML IJ SOLN
INTRAMUSCULAR | Status: DC | PRN
  Administered 2015-08-19: 1 mg via INTRAVENOUS
  Administered 2015-08-19: 2 mg via INTRAVENOUS

## 2015-08-19 MED ORDER — MEPERIDINE HCL 100 MG/ML IJ SOLN
INTRAMUSCULAR | Status: AC
  Filled 2015-08-19: qty 1

## 2015-08-19 MED ORDER — PROMETHAZINE HCL 25 MG/ML IJ SOLN
INTRAMUSCULAR | Status: AC
  Filled 2015-08-19: qty 1

## 2015-08-19 MED ORDER — LIDOCAINE VISCOUS 2 % MT SOLN
OROMUCOSAL | Status: AC
  Filled 2015-08-19: qty 15

## 2015-08-19 MED ORDER — SODIUM CHLORIDE 0.9 % IV SOLN
INTRAVENOUS | Status: DC
  Administered 2015-08-19: 13:00:00 via INTRAVENOUS

## 2015-08-19 MED ORDER — MINERAL OIL PO OIL
TOPICAL_OIL | ORAL | Status: AC
  Filled 2015-08-19: qty 30

## 2015-08-19 NOTE — H&P (Signed)
Primary Care Physician:  Evelina Dun, FNP Primary Gastroenterologist:  Dr. Oneida Alar  Pre-Procedure History & Physical: HPI:  [name] is a 65 y.o. male here for [facility].  Past Medical History  Diagnosis Date  . Diverticulosis 03/04/10  . Peripheral vascular disease, unspecified ([place])   . Undiagnosed cardiac murmurs   . Other symptoms involving cardiovascular system   . HLD (hyperlipidemia)   . HTN (hypertension)   . Type II or unspecified type diabetes mellitus without mention of complication, not stated as uncontrolled   . Ulcer   . Carotid artery occlusion   . Umbilical hernia now    has not been repaired  . Blood transfusion   . AVM (arteriovenous malformation) of colon with hemorrhage   . Gastritis   . Internal hemorrhoids   . Right carotid bruit   . Shortness of breath     noted /w low Hgb  . GERD (gastroesophageal reflux disease)   . Chronic kidney disease     renal failure /w osteomyelitis- 2010  . Arthritis     back  . Anemia     8/3,4,5- blood transfusion- [facility], colonoscopy- done & found 2 areas of bleeding   . Tobacco use disorder   . Anxiety   . CHF (congestive heart failure) ([place])     3 yrs ago  . Irregular heart beat   . Iron deficiency anemia due to chronic blood loss 10/07/2010    Past Surgical History  Procedure Laterality Date  . Colonoscopy  03/04/2010    Dr. Princella Pellegrini AMV without mention of ablation, scattered diverticula, internal hemorrhoids  . Bilateral common superficial femoral and profudus artery endarterectomies      2003    Dr. Sherren Mocha Early  . Esophagogastroduodenoscopy  03/2000    Dr. Tharon Aquas gastritis, H.Pylori gastritis, ?treated  . Esophagogastroduodenoscopy  05/2006    Dr. Marcello Fennel recurrent GI bleed. antral gastritis, single gastric AVM s/p APC, CLO results?  . Colonoscopy  11/2004    Dr. Barkley Bruns  . 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 Colonoscopy   01/22/2012    NUR: Two cecal AV malformations without stigmata of bleed with maximal.meter of 8-10 mm. Both of these are ablated with argon plasma coagulator./ Small external hemorrhoids  . Esophagogastroduodenoscopy  01/21/2012    SLF: MILD TO MAODERATE Gastritis/ SLOW GIB LIKEY DUE TO PT BEING ON ASA ND SUBSEQUENT BLOOD/ LOSS FROM AVMS IN STOMACH AND COLON AS WELL AS GASTRITIS  . Enteroscopy  01/21/2012    Procedure: ENTEROSCOPY;  Surgeon: Danie Binder, MD;  [facility];  Service: Endoscopy;;  . No past surgeries    . Vascular surgery      rt bka and rt 1st toe amputation  . Below knee leg amputation      2010, wears prosthesis   . Endarterectomy  02/09/2012    Procedure: ENDARTERECTOMY CAROTID;  Surgeon: Rosetta Posner, MD;  Location: [place];  Service: Vascular;  Laterality: Left;  . Carotid endarterectomy Left 02-09-12  . Esophagogastroduodenoscopy N/A 09/03/2013    Mild chronic gastritis. benign gastric polyps  . Givens capsule study N/A 09/03/2013    Dr. Oneida Alar: small bowel and colonic AVMs. no active bleeding    Prior to Admission medications   Medication Sig Start Date End Date Taking? Authorizing Provider  amLODipine (NORVASC) 10 MG tablet TAKE 1 TABLET BY MOUTH DAILY, BEFORE BREAKFAST 05/06/15  Yes Sharion Balloon, FNP  atenolol (TENORMIN) 100  MG tablet TAKE 1 TABLET (100 MG TOTAL) BY MOUTH DAILY. 03/23/15  Yes Sharion Balloon, FNP  Calcium Carbonate Antacid (TUMS ULTRA 1000 PO) Take 2 tablets by mouth daily.   Yes Historical Provider, MD  cloNIDine (CATAPRES) 0.2 MG tablet Take 1 tablet (0.2 mg total) by mouth 2 (two) times daily. 07/11/15  Yes [name] Done, FNP  furosemide (LASIX) 40 MG tablet TAKE 1 TABLET (40 MG TOTAL) BY MOUTH AS NEEDED. 03/20/14  Yes [name] Done, FNP  gabapentin (NEURONTIN) 300 MG capsule Take 1 capsule (300 mg total) by mouth 3 (three) times daily. 03/22/14  Yes Sharion Balloon, FNP  glipiZIDE (GLUCOTROL) 10 MG tablet Take 1.5 tablets (15 mg  total) by mouth daily. Takes one tablet in the Am and 1/2 tablet in the pm 06/19/15  Yes Sharion Balloon, FNP  HYDROcodone-acetaminophen (NORCO/VICODIN) 5-325 MG tablet Take 1 tablet by mouth every 8 (eight) hours as needed. 05/06/15  Yes Sharion Balloon, FNP  ondansetron (ZOFRAN) 4 MG tablet Take 1 tablet (4 mg total) by mouth 4 (four) times daily -  before meals and at bedtime. 08/15/15  Yes Orvil Feil, NP  pantoprazole (PROTONIX) 40 MG tablet TAKE 1 TABLET BY MOUTH TWICE A DAY BEFORE MEAL 06/19/15  Yes Sharion Balloon, FNP  silver sulfADIAZINE (SILVADENE) 1 % cream APPLY TO AFFECTED AREA TWICE A DAY 03/14/14  Yes [name] Done, FNP  tamsulosin (FLOMAX) 0.4 MG CAPS capsule Take 1 capsule at bedtime nightly for urinary symptoms. 05/30/15  Yes Manon Hilding Kefalas, PA-C  valsartan (DIOVAN) 320 MG tablet Take 1 tablet (320 mg total) by mouth daily. 05/06/15  Yes Sharion Balloon, FNP  polyethylene glycol powder (GLYCOLAX/MIRALAX) powder Take 1 Container by mouth once. Only as needed    Historical Provider, MD    Allergies as of 08/18/2015 - Review Complete 08/18/2015  Allergen Reaction Noted  . Zocor [simvastatin] Palpitations 03/07/2013  . Asa [aspirin] Other (See Comments) 03/22/2014  . Hctz [hydrochlorothiazide]  12/06/2012  . Iodine Hives 10/07/2010  . Sulfa antibiotics Other (See Comments) 10/07/2010    Family History  Problem Relation Age of Onset  . Colon cancer Brother 66    deceased  . Hyperlipidemia Brother   . Hypertension Brother   . Lung cancer Sister     deceased, three primary cancers: lung, esophageal, lymphoma.   . Diabetes Sister   . Hypertension Sister   . Cancer Father     gallbladder  . Hyperlipidemia Father   . Hypertension Father   . Cancer Brother     unknown primary  . Cancer Sister     unknown primary  . Hyperlipidemia Sister   . Diabetes Mother   . Hypertension Mother     Social History   Social History  . Marital Status: Married    Spouse Name:  N/A  . Number of Children: N/A  . Years of Education: N/A   Occupational History  . Not on file.   Social History Main Topics  . Smoking status: Current Every Day Smoker -- 2.00 packs/day for 50 years    Types: Cigarettes  . Smokeless tobacco: Former Systems developer    Quit date: 02/08/2012     Comment: Smokes about 2 packs of tiny skinny cigarettes  . Alcohol Use: No  . Drug Use: No  . Sexual Activity: Not on file   Other Topics Concern  . Not on file   Social History Narrative    Review of Systems: See  HPI, otherwise negative ROS   Physical Exam: BP 174/82 mmHg  Pulse 80  Temp(Src) 98.3 F (36.8 C)  Resp 12  SpO2 96% General:   Alert,  pleasant and cooperative in NAD Head:  Normocephalic and atraumatic. Neck:  Supple; Lungs:  Clear throughout to auscultation.    Heart:  Regular rate and rhythm. Abdomen:  Soft, nontender and nondistended. Normal bowel sounds, without guarding, and without rebound.   Neurologic:  Alert and  oriented x4;  grossly normal neurologically.  Impression/Plan:     DYSPHAGIA/ANEMIA  Plan: 1. EGD/?DIL TODAY.

## 2015-08-19 NOTE — Progress Notes (Signed)
REVIEWED. FEDA DUE TO AVMs. PT HAS HAD TCS 2009, 2011, & 2013. NO INDICATION FOR TCS AT THIS TIME. HE SHOULD FOLLOW WITH HEMATOLOGY TO MANAGE CHRONIC ANEMIA.

## 2015-08-19 NOTE — Op Note (Signed)
[facility] [address] [place], [address]   ENDOSCOPY PROCEDURE REPORT  PATIENT: [name], [name]  MR#: [id number] BIRTHDATE: [birth year] , 68  yrs. old GENDER: male  ENDOSCOPIST: Danie Binder, MD REFFERED [id number] Hawks, [place]:  Aug 25, 2015 PROCEDURE:   EGD for control of bleeding and EGD with dilatation over guidewire  INDICATIONS:1.  dysphagia.   2.  iron deficiency anemia. MEDICATIONS: Promethazine (Phenergan) 12.5 mg IV, Fentanyl 50 mcg IV, and Versed 3 mg IV  MD STARTED SEDATION: 1439. PROCEDURE COMPLETE: 1518  TOPICAL ANESTHETIC: Viscous Xylocaine  DESCRIPTION OF PROCEDURE:   After the risks benefits and alternatives of the procedure were thoroughly explained, informed consent was obtained.  The EG-[id number] ([id number])  endoscope was introduced through the mouth and advanced to the second portion of the duodenum. The instrument was slowly withdrawn as the mucosa was carefully examined.  Prior to withdrawal of the scope, the guidwire was placed.  The esophagus was dilated successfully.  The patient was recovered in endoscopy and discharged home in satisfactory condition. Estimated blood loss is zero unless otherwise noted in this procedure report.   ESOPHAGUS: The mucosa of the esophagus appeared normal.   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  ONE ACTIVELY BLEEDING.  HEMOSTASIS ACHIEVED WITH APC(0.8-1.5L/min, 30-35 W) AND 7 Fr BICAP PROBE(25 W).  ONE HEMOCLIP PLACED.   Moderate non-erosive gastritis (inflammation) was found in the gastric antrum.   DUODENUM: The duodenal mucosa showed no abnormalities in the bulb and second portion of the duodenum.  NO OLD OR FRESH BLOOD SEEN IN DISTAL STOMACH OR DUODENUM.  Dilation was then performed at the proximal esophagus Dilator: Savary over guidewire Size(s): 15-17 MM Resistance: minimal Heme: yes TRACE  COMPLICATIONS: There were no immediate complications.  ENDOSCOPIC  IMPRESSION: 1.   TRANSFUSION DEPENDENT ANEMIA DUE TO GASTRIC AVMs 2.   MODERATE Non-erosive gastritis  RECOMMENDATIONS: FOLLOW A SOFT MECHANICAL DIET. PROTONIX 30 MINS PRIOR TO MEALS TWICE DAILY. EAT 4 TO 6 MEALS A DAY. DRINK 3-4 CANS OF ENSURE, Boost, or CARNATION INSTANT BREAKFAST A DAY TO PREVENT WEIGHT LOSS. CALL IN ONE MONTH IF SYMPTOMS ARE NOT IMPROVED.  PT WILL NEED A BPE. FOLLOW UP IN 3 MOS.   eSigned:  Danie Binder, MD Aug 25, 2015 3:38 PM  CPT CODES: ICD CODES:  The ICD and CPT codes recommended by this software are interpretations from the data that the clinical staff has captured with the software.  The verification of the translation of this report to the ICD and CPT codes and modifiers is the sole responsibility of the health care institution and practicing physician where this report was generated.  [employer] will not be held responsible for the validity of the ICD and CPT codes included on this report.  AMA assumes no liability for data contained or not contained herein. CPT is a Designer, television/film set of the [employer].

## 2015-08-19 NOTE — Progress Notes (Signed)
CC'D TO PCP °

## 2015-08-19 NOTE — Discharge Instructions (Signed)
I dilated your esophagus. I DID NOT SEE A DEFINITE NARROWING IN YOUR esophagus. You have gastritis. I cauterized two gastric AVMs and put a clip on one.   NO MRI FOR 30 DAYS DUE TO METAL CLIP PLACEMENT IN THE STOMACH.  FOLLOW A SOFT MECHANICAL DIET. SEE INFO BELOW .MEATS SHOULD BE CHOPPED OR GROUND ONLY.  CONTINUE PROTONIX. TAKE 30 MINUTES PRIOR TO MEALS TWICE DAILY.  EAT 4 TO 6 MEALS A DAY.  DRINK 3-4 CANS OF ENSURE, boost, or CARNATION INSTANT BREAKFAST A DAY IF YOU ARE LOSING WEIGHT.  PLEASE CALL IN ONE MONTH IF SYMPTOMS ARE NOT IMPROVED.   FOLLOW UP IN 3 MOS. UPPER ENDOSCOPY AFTER CARE Read the instructions outlined below and refer to this sheet in the next week. These discharge instructions provide you with general information on caring for yourself after you leave the hospital. While your treatment has been planned according to the most current medical practices available, unavoidable complications occasionally occur. If you have any problems or questions after discharge, call DR. Abdishakur Gottschall, [phone].  ACTIVITY  You may resume your regular activity, but move at a slower pace for the next 24 hours.   Take frequent rest periods for the next 24 hours.   Walking will help get rid of the air and reduce the bloated feeling in your belly (abdomen).   No driving for 24 hours (because of the medicine (anesthesia) used during the test).   You may shower.   Do not sign any important legal documents or operate any machinery for 24 hours (because of the anesthesia used during the test).    NUTRITION  Drink plenty of fluids.   You may resume your normal diet as instructed by your doctor.   Begin with a light meal and progress to your normal diet. Heavy or fried foods are harder to digest and may make you feel sick to your stomach (nauseated).   Avoid alcoholic beverages for 24 hours or as instructed.    MEDICATIONS  You may resume your normal medications.   WHAT YOU CAN  EXPECT TODAY  Some feelings of bloating in the abdomen.   Passage of more gas than usual.    IF YOU HAD A BIOPSY TAKEN DURING THE UPPER ENDOSCOPY:  Eat a soft diet IF YOU HAVE NAUSEA, BLOATING, ABDOMINAL PAIN, OR VOMITING.    FINDING OUT THE RESULTS OF YOUR TEST Not all test results are available during your visit. DR. Oneida Alar WILL CALL YOU WITHIN 14 DAYS OF YOUR PROCEDUE WITH YOUR RESULTS. Do not assume everything is normal if you have not heard from DR. Jaxon Mynhier, CALL HER OFFICE AT [phone].  SEEK IMMEDIATE MEDICAL ATTENTION AND CALL THE OFFICE: [phone] IF:  You have more than a spotting of blood in your stool.   Your belly is swollen (abdominal distention).   You are nauseated or vomiting.   You have a temperature over 101F.   You have abdominal pain or discomfort that is severe or gets worse throughout the day.  Gastritis  Gastritis is an inflammation (the body's way of reacting to injury and/or infection) of the stomach. It is often caused by viral or bacterial (germ) infections. It can also be caused BY ASPIRIN, BC/GOODY POWDER'S, (IBUPROFEN) MOTRIN, OR ALEVE (NAPROXEN), chemicals (including alcohol), SPICY FOODS, and medications. This illness may be associated with generalized malaise (feeling tired, not well), UPPER ABDOMINAL STOMACH cramps, and fever. One common bacterial cause of gastritis is an organism known as H. Pylori. This can be  treated with antibiotics.    DYSPHAGIA DYSPHAGIA can be caused by stomach acid backing up into the tube that carries food from the mouth down to the stomach (lower esophagus).  TREATMENT There are a number of non-prescription medicines used to treat DYSPHAGIA including: Antacids.  Proton-pump inhibitors: PROTONIX  HOME CARE INSTRUCTIONS Eat 2-3 hours before going to bed.  Try to reach and maintain a healthy weight.  Do not eat just a few very large meals. Instead, eat 4 TO 6 smaller meals throughout the day.  Try to identify  foods and beverages that make your symptoms worse, and avoid these.  Avoid tight clothing.  Do not exercise right after eating.  SOFT MECHANICAL DIET This SOFT MECHANICAL DIET is restricted to:  Foods that are moist, soft-textured, and easy to chew and swallow. MEATS SHOULD BE CHOPPED OR GROUND.  Meats that are ground or are minced no larger than one-quarter inch pieces. Meats are moist with gravy or sauce added.   Foods that do not include bread or bread-like textures except soft pancakes, well-moistened with syrup or sauce.   Textures with some chewing ability required.   Casseroles without rice.   Cooked vegetables that are less than half an inch in size and easily mashed with a fork. No cooked corn, peas, broccoli, cauliflower, cabbage, Brussels sprouts, asparagus, or other fibrous, non-tender or rubbery cooked vegetables.   Canned fruit except for pineapple. Fruit must be cut into pieces no larger than half an inch in size.   Foods that do not include nuts, seeds, coconut, or sticky textures.

## 2015-08-19 NOTE — Telephone Encounter (Signed)
REVIEWED-NO ADDITIONAL RECOMMENDATIONS. 

## 2015-08-20 ENCOUNTER — Telehealth: Payer: Self-pay | Admitting: Gastroenterology

## 2015-08-20 NOTE — Telephone Encounter (Signed)
I called and spoke to the pt's wife. She said she thought Vicente Males was gong to call in antibiotics for the pt. I asked her for what and she said she did not know. I told her that I do not see anything in Anna's note that he was supposed to have antibiotics. I asked her had he tried the [place] and she said he took the first one this AM. She is aware that he is to call if he wants a prescription for that.

## 2015-08-20 NOTE — Telephone Encounter (Signed)
[phone]  PATIENT WIFE CALLED INQUIRING ABOUT ANTI BIOTICS THAT WERE SUPPOSED TO BE CALLED IN FOR THE PATIENT

## 2015-08-20 NOTE — Telephone Encounter (Signed)
No idea what antibiotics he is referring to. Sorry!

## 2015-08-22 ENCOUNTER — Encounter (HOSPITAL_COMMUNITY): Payer: Self-pay | Admitting: Gastroenterology

## 2015-08-27 ENCOUNTER — Ambulatory Visit (INDEPENDENT_AMBULATORY_CARE_PROVIDER_SITE_OTHER): Payer: Medicare Other | Admitting: Cardiovascular Disease

## 2015-08-27 ENCOUNTER — Encounter: Payer: Self-pay | Admitting: Cardiovascular Disease

## 2015-08-27 VITALS — BP 122/64 | HR 74 | Ht 60.0 in | Wt 157.0 lb

## 2015-08-27 DIAGNOSIS — I739 Peripheral vascular disease, unspecified: Secondary | ICD-10-CM | POA: Diagnosis not present

## 2015-08-27 DIAGNOSIS — I119 Hypertensive heart disease without heart failure: Secondary | ICD-10-CM | POA: Diagnosis not present

## 2015-08-27 DIAGNOSIS — E785 Hyperlipidemia, unspecified: Secondary | ICD-10-CM

## 2015-08-27 DIAGNOSIS — I10 Essential (primary) hypertension: Secondary | ICD-10-CM | POA: Diagnosis not present

## 2015-08-27 NOTE — Progress Notes (Signed)
Cardiology Office Note   Date:  08/27/2015   ID:  ONOFRIO [name], DOB 07-20-1950, MRN [id number]  PCP:  Evelina Dun, FNP  Cardiologist:   Thayer Headings, MD   Chief Complaint  Patient presents with  . Establish Care    some trouble with BP     Problem list 1. Essential hypertension 2. Diabetes mellitus - compensated by peripheral vascular disease 3. s/p right BKA  4.  Hyperlipidemia 5. Peripheral vascular disease - s/p left carotid endarterectomy 6. Cigarette smoking  7. Chronic kedney disease - stage 4  History of Present Illness: [name] is a 65 y.o. male who presents for evaluation of his HTN  BP is variable . Is well controlled today   Is eating poorly for the past month or so. Has lost 20 lbs or so  Because of the nausea Has  Been advised to avoid high potassium foods    Past Medical History  Diagnosis Date  . Diverticulosis 03/04/10  . Peripheral vascular disease, unspecified ([place])   . Undiagnosed cardiac murmurs   . Other symptoms involving cardiovascular system   . HLD (hyperlipidemia)   . HTN (hypertension)   . Type II or unspecified type diabetes mellitus without mention of complication, not stated as uncontrolled   . Ulcer   . Carotid artery occlusion   . Umbilical hernia now    has not been repaired  . Blood transfusion   . AVM (arteriovenous malformation) of colon with hemorrhage   . Gastritis   . Internal hemorrhoids   . Right carotid bruit   . Shortness of breath     noted /w low Hgb  . GERD (gastroesophageal reflux disease)   . Chronic kidney disease     renal failure /w osteomyelitis- 2010  . Arthritis     back  . Anemia     8/3,4,5- blood transfusion- [facility], colonoscopy- done & found 2 areas of bleeding   . Tobacco use disorder   . Anxiety   . CHF (congestive heart failure) ([place])     3 yrs ago  . Irregular heart beat   . Iron deficiency anemia due to chronic blood loss 10/07/2010    Past Surgical History  Procedure  Laterality Date  . Colonoscopy  03/04/2010    Dr. Princella Pellegrini AMV without mention of ablation, scattered diverticula, internal hemorrhoids  . Bilateral common superficial femoral and profudus artery endarterectomies      2003    Dr. Sherren Mocha Early  . Esophagogastroduodenoscopy  03/2000    Dr. Tharon Aquas gastritis, H.Pylori gastritis, ?treated  . Esophagogastroduodenoscopy  05/2006    Dr. Marcello Fennel recurrent GI bleed. antral gastritis, single gastric AVM s/p APC, CLO results?  . Colonoscopy  11/2004    Dr. Barkley Bruns  . 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 Both of these are ablated with argon plasma coagulator./ Small external hemorrhoids  . Esophagogastroduodenoscopy  01/21/2012    SLF: MILD TO MAODERATE Gastritis/ SLOW GIB LIKEY DUE TO PT BEING ON ASA ND SUBSEQUENT BLOOD/ LOSS FROM AVMS IN STOMACH AND COLON AS WELL AS GASTRITIS  . Enteroscopy  01/21/2012    Procedure: ENTEROSCOPY;  Surgeon: Danie Binder, MD;  [facility];  Service: Endoscopy;;  . No past surgeries    . Vascular surgery      rt  bka and rt 1st toe amputation  . Below knee leg amputation      2010, wears prosthesis   . Endarterectomy  02/09/2012    Procedure: ENDARTERECTOMY CAROTID;  Surgeon: Rosetta Posner, MD;  Location: [place];  Service: Vascular;  Laterality: Left;  . Carotid endarterectomy Left 02-09-12  . Esophagogastroduodenoscopy N/A 09/03/2013    Mild chronic gastritis. benign gastric polyps  . Givens capsule study N/A 09/03/2013    Dr. Oneida Alar: small bowel and colonic AVMs. no active bleeding  . Esophagogastroduodenoscopy N/A 08/19/2015    Procedure: ESOPHAGOGASTRODUODENOSCOPY (EGD);  Surgeon: Danie Binder, MD;  [facility];  Service: Endoscopy;  Laterality: N/A;  200  . Savory dilation N/A 08/19/2015    Procedure: SAVORY DILATION;   Surgeon: Danie Binder, MD;  [facility];  Service: Endoscopy;  Laterality: N/A;     Current Outpatient Prescriptions  Medication Sig Dispense Refill  . amLODipine (NORVASC) 10 MG tablet TAKE 1 TABLET BY MOUTH DAILY, BEFORE BREAKFAST 90 tablet 1  . atenolol (TENORMIN) 100 MG tablet TAKE 1 TABLET (100 MG TOTAL) BY MOUTH DAILY. 90 tablet 1  . Calcium Carbonate Antacid (TUMS ULTRA 1000 PO) Take 2 tablets by mouth daily.    . cloNIDine (CATAPRES) 0.2 MG tablet Take 1 tablet (0.2 mg total) by mouth 2 (two) times daily. 60 tablet 3  . furosemide (LASIX) 40 MG tablet TAKE 1 TABLET (40 MG TOTAL) BY MOUTH AS NEEDED. 30 tablet 5  . gabapentin (NEURONTIN) 300 MG capsule Take 1 capsule (300 mg total) by mouth 3 (three) times daily. 270 capsule 3  . glipiZIDE (GLUCOTROL) 10 MG tablet Take 1.5 tablets (15 mg total) by mouth daily. Takes one tablet in the Am and 1/2 tablet in the pm 135 tablet 0  . HYDROcodone-acetaminophen (NORCO/VICODIN) 5-325 MG tablet Take 1 tablet by mouth every 8 (eight) hours as needed. 30 tablet 0  . ondansetron (ZOFRAN) 4 MG tablet Take 1 tablet (4 mg total) by mouth 4 (four) times daily -  before meals and at bedtime. 120 tablet 1  . pantoprazole (PROTONIX) 40 MG tablet TAKE 1 TABLET BY MOUTH TWICE A DAY BEFORE MEAL 180 tablet 1  . polyethylene glycol powder (GLYCOLAX/MIRALAX) powder Take 1 Container by mouth once. Only as needed    . silver sulfADIAZINE (SILVADENE) 1 % cream APPLY TO AFFECTED AREA TWICE A DAY 240 g 1  . tamsulosin (FLOMAX) 0.4 MG CAPS capsule Take 1 capsule at bedtime nightly for urinary symptoms. 30 capsule 6  . valsartan (DIOVAN) 320 MG tablet Take 1 tablet (320 mg total) by mouth daily. 90 tablet 2  . Vitamin D, Ergocalciferol, (DRISDOL) 50000 units CAPS capsule Take 50,000 Units by mouth once a week.  0   No current facility-administered medications for this visit.    Allergies:   Zocor; Asa; Hctz; Iodine; and Sulfa antibiotics    Social  History:  The patient  reports that he has been smoking Cigarettes.  He has a 100 pack-year smoking history. He quit smokeless tobacco use about 3 years ago. He reports that he does not drink alcohol or use illicit drugs.   Family History:  The patient's family history includes Cancer in his brother, father, and sister; Colon cancer (age of onset: 46) in his brother; Diabetes in his mother and sister; Hyperlipidemia in his brother, father, and sister; Hypertension in his brother, father, mother, and sister; Lung cancer in his sister.    ROS:  Please  see the history of present illness.    Review of Systems: Constitutional:  denies fever, chills, diaphoresis, appetite change and fatigue.  HEENT: denies photophobia, eye pain, redness, hearing loss, ear pain, congestion, sore throat, rhinorrhea, sneezing, neck pain, neck stiffness and tinnitus.  Respiratory: admits to  Some SOB, DOE, cough, chest tightness, and wheezing.  Cardiovascular: denies chest pain, palpitations and leg swelling.  Gastrointestinal: admits to nausea frequently ,  Has significant abdominal pain   Genitourinary: denies dysuria, urgency, frequency, hematuria, flank pain and difficulty urinating.  Musculoskeletal: denies  myalgias, back pain, joint swelling, arthralgias and gait problem.   Skin: denies pallor, rash and wound.  Neurological: denies dizziness, seizures, syncope, weakness, light-headedness, numbness and headaches.   Hematological: denies adenopathy, easy bruising, personal or family bleeding history.  Psychiatric/ Behavioral: denies suicidal ideation, mood changes, confusion, nervousness, sleep disturbance and agitation.       All other systems are reviewed and negative.    PHYSICAL EXAM: VS:  BP 122/64 mmHg  Pulse 74  Ht 5' (1.524 m)  Wt 157 lb (71.215 kg)  BMI 30.66 kg/m2  SpO2 97% , BMI Body mass index is 30.66 kg/(m^2). GEN: Well nourished, well developed, in no acute distress HEENT: normal Neck:  no JVD, moderate right carotid bruit,   Left CEA scar, or masses Cardiac: RRR; 2/6 systolic murmur ,  no rubs, or gallops,no edema  Respiratory:  clear to auscultation bilaterally, normal work of breathing GI: soft, nontender, nondistended, + BS MS: s/p right BKA  Skin: warm and dry, no rash Neuro:  Strength and sensation are intact Psych: normal   EKG:  EKG is ordered today. The ekg ordered today demonstrates NSR at 73.   Moderate voltage for LVH.      Recent Labs: 05/06/2015: ALT 10; BUN 34*; Creatinine, Ser 3.65*; Potassium 5.0; Sodium 140 08/11/2015: Hemoglobin 10.2*; Platelets 207    Lipid Panel    Component Value Date/Time   CHOL 174 05/06/2015 1512   CHOL 218* 03/14/2014 1057   CHOL 344* 12/06/2012 0852   TRIG 297* 05/06/2015 1512   TRIG 286* 03/14/2014 1057   TRIG 744* 12/06/2012 0852   HDL 29* 05/06/2015 1512   HDL 33* 03/14/2014 1057   HDL 30* 08/01/2013 1600   HDL 31* 12/06/2012 0852   CHOLHDL 6.0* 05/06/2015 1512   CHOLHDL 4.2 08/01/2013 1600   VLDL 59* 08/01/2013 1600   LDLCALC 86 05/06/2015 1512   LDLCALC 128* 03/14/2014 1057   LDLCALC 36 08/01/2013 1600   LDLCALC 164* 12/06/2012 0852      Wt Readings from Last 3 Encounters:  08/27/15 157 lb (71.215 kg)  08/18/15 156 lb 12.8 oz (71.124 kg)  07/11/15 172 lb (78.019 kg)      Other studies Reviewed: Additional studies/ records that were reviewed today include: . Review of the above records demonstrates:    ASSESSMENT AND PLAN:  1. Essential hypertension-   He's on very high doses of multiple medications. He has chronic daily disease stage IV. He still smokes. He eats salty diet. At this point of told him that there is not much we can do for his blood pressure. He needs to stop smoking. He needs to eat a low-salt diet.  I suspect that he will go on dialysis in the near future. Once he is on dialysis I will have the nephrologist manage his antihypertensives. He has voltage for left ventricular  hypertrophy on his EKG. We'll check an echo card gram.  I've discussed the  fact that he has severe vascular disease throughout his whole body.   I would suspect that he has CAD as well but up to this point he denies any angina.   He will likely need a stress myoview prior to any major surgical procedures   2. Diabetes mellitus - compensated by peripheral vascular disease - managed by his primary MD   3. s/p right BKA  4.  Hyperlipidemia-  5. Peripheral vascular disease - s/p left carotid endarterectomy-  Has moderate - severe disease in his Right carotid artery.    Advised him to stop smoking .    Followed by [facility]. He may have some degree ov mesenteric ischemia causing his GI issues.     6. Cigarette smoking  7. Chronic kedney disease - stage 4   Current medicines are reviewed at length with the patient today.  The patient does not have concerns regarding medicines.  The following changes have been made:  no change  Labs/ tests ordered today include:  No orders of the defined types were placed in this encounter.     Disposition:   FU with me in 6 months       Nahser, Wonda Cheng, MD  08/27/2015 12:37 PM    [address], [place], [place]  [address] Phone: [phone]; Fax: [phone]   [facility]  [address] [place], [place]  [address] [phone]   Fax [phone]

## 2015-08-27 NOTE — Patient Instructions (Signed)
Medication Instructions:  Your physician recommends that you continue on your current medications as directed. Please refer to the Current Medication list given to you today.   Labwork: None Ordered   Testing/Procedures: Your physician has requested that you have an echocardiogram. Echocardiography is a painless test that uses sound waves to create images of your heart. It provides your doctor with information about the size and shape of your heart and how well your heart's chambers and valves are working. This procedure takes approximately one hour. There are no restrictions for this procedure.   Follow-Up Your physician wants you to follow-up in: 6 months with Dr. Acie Fredrickson.  You will receive a reminder letter in the mail two months in advance. If you don't receive a letter, please call our office to schedule the follow-up appointment.   If you need a refill on your cardiac medications before your next appointment, please call your pharmacy.   Thank you for choosing [facility]! Christen Bame, RN [phone]

## 2015-08-28 ENCOUNTER — Emergency Department (HOSPITAL_COMMUNITY)
Admission: EM | Admit: 2015-08-28 | Discharge: 2015-08-28 | Disposition: A | Discharge status: Home / Self Care | Payer: Medicare Other | Attending: Dermatology | Admitting: Dermatology

## 2015-08-28 ENCOUNTER — Encounter: Payer: Self-pay | Admitting: Family Medicine

## 2015-08-28 ENCOUNTER — Encounter (HOSPITAL_COMMUNITY): Payer: Self-pay | Admitting: Emergency Medicine

## 2015-08-28 ENCOUNTER — Ambulatory Visit (INDEPENDENT_AMBULATORY_CARE_PROVIDER_SITE_OTHER): Payer: Medicare Other | Admitting: Family Medicine

## 2015-08-28 VITALS — BP 144/73 | HR 81 | Temp 98.4°F | Ht 60.0 in | Wt 158.0 lb

## 2015-08-28 DIAGNOSIS — I130 Hypertensive heart and chronic kidney disease with heart failure and stage 1 through stage 4 chronic kidney disease, or unspecified chronic kidney disease: Secondary | ICD-10-CM | POA: Insufficient documentation

## 2015-08-28 DIAGNOSIS — Z5321 Procedure and treatment not carried out due to patient leaving prior to being seen by health care provider: Secondary | ICD-10-CM | POA: Insufficient documentation

## 2015-08-28 DIAGNOSIS — N189 Chronic kidney disease, unspecified: Secondary | ICD-10-CM | POA: Diagnosis not present

## 2015-08-28 DIAGNOSIS — E1151 Type 2 diabetes mellitus with diabetic peripheral angiopathy without gangrene: Secondary | ICD-10-CM | POA: Insufficient documentation

## 2015-08-28 DIAGNOSIS — D649 Anemia, unspecified: Secondary | ICD-10-CM | POA: Insufficient documentation

## 2015-08-28 DIAGNOSIS — R195 Other fecal abnormalities: Secondary | ICD-10-CM

## 2015-08-28 DIAGNOSIS — I509 Heart failure, unspecified: Secondary | ICD-10-CM | POA: Diagnosis not present

## 2015-08-28 DIAGNOSIS — Z79891 Long term (current) use of opiate analgesic: Secondary | ICD-10-CM | POA: Insufficient documentation

## 2015-08-28 DIAGNOSIS — D62 Acute posthemorrhagic anemia: Secondary | ICD-10-CM | POA: Diagnosis not present

## 2015-08-28 DIAGNOSIS — Z79899 Other long term (current) drug therapy: Secondary | ICD-10-CM | POA: Diagnosis not present

## 2015-08-28 DIAGNOSIS — Z7984 Long term (current) use of oral hypoglycemic drugs: Secondary | ICD-10-CM | POA: Insufficient documentation

## 2015-08-28 DIAGNOSIS — E785 Hyperlipidemia, unspecified: Secondary | ICD-10-CM | POA: Diagnosis not present

## 2015-08-28 LAB — FINGERSTICK HEMOGLOBIN: Hemoglobin: 8 g/dL — ABNORMAL LOW (ref 12.6–17.7)

## 2015-08-28 NOTE — Progress Notes (Signed)
BP 144/73 mmHg  Pulse 81  Temp(Src) 98.4 F (36.9 C) (Oral)  Ht 5' (1.524 m)  Wt 158 lb (71.668 kg)  BMI 30.86 kg/m2   Subjective:    Patient ID: [name], male    DOB: 02-May-1951, 65 y.o.   MRN: [id number]  HPI: [name] is a 65 y.o. male presenting on 08/28/2015 for Has had black stool, noticed yesterday   HPI Dark tarry stools and lightheadedness and dizziness and weakness Patient has been having lightheadedness and dizziness and weakness and dark tarry stools this been going on for the past couple weeks. He was over any pattern recently had an EGD with clips to cauterize some polyps. He also received 2 units of blood. When he left the hospital his hemoglobin was 10.0. Today we did a rapid hemoglobin and it came back as 8.0. With his symptoms of dark tarry stools continued and weakness and fatigue and lack of energy on top of his recent history we will go ahead and send him back to the emergency department. He denies any bright red blood or abdominal pain.  Relevant past medical, surgical, family and social history reviewed and updated as indicated. Interim medical history since our last visit reviewed. Allergies and medications reviewed and updated.  Review of Systems  Constitutional: Positive for fatigue. Negative for fever.  HENT: Negative for ear discharge and ear pain.   Eyes: Negative for discharge and visual disturbance.  Respiratory: Negative for shortness of breath and wheezing.   Cardiovascular: Negative for chest pain and leg swelling.  Gastrointestinal: Positive for diarrhea and blood in stool (Dark tarry stools). Negative for abdominal pain, constipation and abdominal distention.  Genitourinary: Negative for hematuria and difficulty urinating.  Musculoskeletal: Negative for back pain and gait problem.  Skin: Negative for color change and rash.  Neurological: Positive for dizziness, weakness and light-headedness. Negative for syncope and headaches.    All other systems reviewed and are negative.   Per HPI unless specifically indicated above     Medication List       This list is accurate as of: 08/28/15  3:43 PM.  Always use your most recent med list.               amLODipine 10 MG tablet  Commonly known as:  NORVASC  TAKE 1 TABLET BY MOUTH DAILY, BEFORE BREAKFAST     atenolol 100 MG tablet  Commonly known as:  TENORMIN  TAKE 1 TABLET (100 MG TOTAL) BY MOUTH DAILY.     cloNIDine 0.2 MG tablet  Commonly known as:  CATAPRES  Take 1 tablet (0.2 mg total) by mouth 2 (two) times daily.     furosemide 40 MG tablet  Commonly known as:  LASIX  TAKE 1 TABLET (40 MG TOTAL) BY MOUTH AS NEEDED.     gabapentin 300 MG capsule  Commonly known as:  NEURONTIN  Take 1 capsule (300 mg total) by mouth 3 (three) times daily.     glipiZIDE 10 MG tablet  Commonly known as:  GLUCOTROL  Take 1.5 tablets (15 mg total) by mouth daily. Takes one tablet in the Am and 1/2 tablet in the pm     HYDROcodone-acetaminophen 5-325 MG tablet  Commonly known as:  NORCO/VICODIN  Take 1 tablet by mouth every 8 (eight) hours as needed.     ondansetron 4 MG tablet  Commonly known as:  ZOFRAN  Take 1 tablet (4 mg total) by mouth 4 (four) times daily -  before meals and at bedtime.     pantoprazole 40 MG tablet  Commonly known as:  PROTONIX  TAKE 1 TABLET BY MOUTH TWICE A DAY BEFORE MEAL     polyethylene glycol powder powder  Commonly known as:  GLYCOLAX/MIRALAX  Take 1 Container by mouth once. Only as needed     silver sulfADIAZINE 1 % cream  Commonly known as:  SILVADENE  APPLY TO AFFECTED AREA TWICE A DAY     tamsulosin 0.4 MG Caps capsule  Commonly known as:  FLOMAX  Take 1 capsule at bedtime nightly for urinary symptoms.     TUMS ULTRA 1000 PO  Take 2 tablets by mouth daily.     valsartan 320 MG tablet  Commonly known as:  DIOVAN  Take 1 tablet (320 mg total) by mouth daily.     Vitamin D (Ergocalciferol) 50000 units Caps capsule   Commonly known as:  DRISDOL  Take 50,000 Units by mouth once a week.           Objective:    BP 144/73 mmHg  Pulse 81  Temp(Src) 98.4 F (36.9 C) (Oral)  Ht 5' (1.524 m)  Wt 158 lb (71.668 kg)  BMI 30.86 kg/m2  Wt Readings from Last 3 Encounters:  08/28/15 158 lb (71.668 kg)  08/27/15 157 lb (71.215 kg)  08/18/15 156 lb 12.8 oz (71.124 kg)    Physical Exam  Constitutional: He is oriented to person, place, and time. He appears well-developed and well-nourished. No distress.  Patient is pale especially around lips and face.  Eyes: Conjunctivae and EOM are normal. Pupils are equal, round, and reactive to light. Right eye exhibits no discharge. No scleral icterus.  Neck: Neck supple. No thyromegaly present.  Cardiovascular: Normal rate, regular rhythm, normal heart sounds and intact distal pulses.   No murmur heard. Pulmonary/Chest: Effort normal and breath sounds normal. No respiratory distress. He has no wheezes.  Abdominal: Soft. Bowel sounds are normal. He exhibits no distension. There is no tenderness. There is no rebound.  Musculoskeletal: Normal range of motion. He exhibits no edema.  Lymphadenopathy:    He has no cervical adenopathy.  Neurological: He is alert and oriented to person, place, and time. No cranial nerve deficit. Coordination normal.  Skin: Skin is warm and dry. No rash noted. He is not diaphoretic.  Psychiatric: He has a normal mood and affect. His behavior is normal.  Vitals reviewed.   Results for orders placed or performed in visit on 08/28/15  Fingerstick Hemoglobin  Result Value Ref Range   Hemoglobin 8.0 (L) 12.6 - 17.7 g/dL      Assessment & Plan:   Problem List Items Addressed This Visit    None    Visit Diagnoses    Dark stools    -  Primary    Relevant Orders    Fingerstick Hemoglobin (Completed)    Acute blood loss anemia        Hemoglobin went from on 08/11/2015 to 8.0 today, will send to ER        Follow up plan: Return in  about 1 week (around 09/04/2015), or if symptoms worsen or fail to improve, for Follow-up anemia.  Counseling provided for all of the vaccine components Orders Placed This Encounter  Procedures  . Fingerstick Hemoglobin    Caryl Pina, MD [facility] 08/28/2015, 3:43 PM

## 2015-08-28 NOTE — ED Notes (Signed)
Seen at [facility] and sent here for low hemoglobin. Endo on 08/18/15 and hemoglobin has from dropped 10 to 8.  Had 2 units blood about 3 weeks about and iron med IV.  Has been getting blood and iron for last 2 years.  Having black tarry stools, last one being yesterday.  Not coughing up any blood.  Denies any pain.

## 2015-08-29 ENCOUNTER — Other Ambulatory Visit: Payer: Self-pay

## 2015-08-29 ENCOUNTER — Telehealth (HOSPITAL_COMMUNITY): Payer: Self-pay | Admitting: Oncology

## 2015-08-29 ENCOUNTER — Encounter (HOSPITAL_COMMUNITY)
Admission: RE | Admit: 2015-08-29 | Discharge: 2015-08-29 | Disposition: A | Discharge status: Home / Self Care | Payer: Medicare Other | Source: Ambulatory Visit | Attending: Hematology & Oncology | Admitting: Hematology & Oncology

## 2015-08-29 ENCOUNTER — Other Ambulatory Visit (HOSPITAL_COMMUNITY): Payer: Self-pay | Admitting: Oncology

## 2015-08-29 ENCOUNTER — Ambulatory Visit (HOSPITAL_COMMUNITY): Payer: Medicare Other | Attending: Cardiology

## 2015-08-29 VITALS — BP 117/51 | HR 68 | Temp 97.6°F | Resp 16

## 2015-08-29 DIAGNOSIS — E785 Hyperlipidemia, unspecified: Secondary | ICD-10-CM | POA: Diagnosis not present

## 2015-08-29 DIAGNOSIS — D500 Iron deficiency anemia secondary to blood loss (chronic): Secondary | ICD-10-CM | POA: Diagnosis not present

## 2015-08-29 DIAGNOSIS — I340 Nonrheumatic mitral (valve) insufficiency: Secondary | ICD-10-CM | POA: Insufficient documentation

## 2015-08-29 DIAGNOSIS — I130 Hypertensive heart and chronic kidney disease with heart failure and stage 1 through stage 4 chronic kidney disease, or unspecified chronic kidney disease: Secondary | ICD-10-CM | POA: Insufficient documentation

## 2015-08-29 DIAGNOSIS — Z720 Tobacco use: Secondary | ICD-10-CM | POA: Diagnosis not present

## 2015-08-29 DIAGNOSIS — E1122 Type 2 diabetes mellitus with diabetic chronic kidney disease: Secondary | ICD-10-CM | POA: Insufficient documentation

## 2015-08-29 DIAGNOSIS — I509 Heart failure, unspecified: Secondary | ICD-10-CM | POA: Insufficient documentation

## 2015-08-29 DIAGNOSIS — I313 Pericardial effusion (noninflammatory): Secondary | ICD-10-CM | POA: Diagnosis not present

## 2015-08-29 DIAGNOSIS — I119 Hypertensive heart disease without heart failure: Secondary | ICD-10-CM

## 2015-08-29 DIAGNOSIS — N189 Chronic kidney disease, unspecified: Secondary | ICD-10-CM | POA: Diagnosis not present

## 2015-08-29 LAB — PREPARE RBC (CROSSMATCH)

## 2015-08-29 NOTE — Telephone Encounter (Signed)
Message on her answering machine is reviewed. Patient's wife reports that stony reported to the emergency room for a low hemoglobin. They note that they waited in the waiting room for a significant amount time and decide to leave the emergency room. They like to know what we can do for him today. The only thing I have to offer is that we can get him a type and screen today and see if short stay can give him 2 units of red blood cells today. His ferritin in February was 250. I place orders for a CBC, ferritin, and a type and screen today. We'll see if we get blood in a timely fashion and see if short stay has availability for him to get blood. If not, may we give him 1 unit of blood today and repeat a type and screen and a CBC on Monday and provide him another unit of blood if necessary.  Robynn Pane, PA-C 08/29/2015 8:35 AM

## 2015-08-31 ENCOUNTER — Encounter (HOSPITAL_COMMUNITY): Payer: Self-pay

## 2015-08-31 ENCOUNTER — Observation Stay (HOSPITAL_COMMUNITY)
Admission: EM | Admit: 2015-08-31 | Discharge: 2015-09-02 | Disposition: A | Discharge status: Home / Self Care | Payer: Medicare Other | Attending: Internal Medicine | Admitting: Internal Medicine

## 2015-08-31 DIAGNOSIS — I509 Heart failure, unspecified: Secondary | ICD-10-CM | POA: Insufficient documentation

## 2015-08-31 DIAGNOSIS — E1122 Type 2 diabetes mellitus with diabetic chronic kidney disease: Secondary | ICD-10-CM | POA: Insufficient documentation

## 2015-08-31 DIAGNOSIS — F17210 Nicotine dependence, cigarettes, uncomplicated: Secondary | ICD-10-CM | POA: Insufficient documentation

## 2015-08-31 DIAGNOSIS — R220 Localized swelling, mass and lump, head: Secondary | ICD-10-CM | POA: Diagnosis not present

## 2015-08-31 DIAGNOSIS — R531 Weakness: Secondary | ICD-10-CM | POA: Diagnosis not present

## 2015-08-31 DIAGNOSIS — D500 Iron deficiency anemia secondary to blood loss (chronic): Secondary | ICD-10-CM

## 2015-08-31 DIAGNOSIS — K922 Gastrointestinal hemorrhage, unspecified: Secondary | ICD-10-CM | POA: Diagnosis not present

## 2015-08-31 DIAGNOSIS — Z23 Encounter for immunization: Secondary | ICD-10-CM | POA: Diagnosis not present

## 2015-08-31 DIAGNOSIS — D649 Anemia, unspecified: Secondary | ICD-10-CM | POA: Diagnosis not present

## 2015-08-31 DIAGNOSIS — N184 Chronic kidney disease, stage 4 (severe): Secondary | ICD-10-CM | POA: Diagnosis not present

## 2015-08-31 DIAGNOSIS — R5383 Other fatigue: Secondary | ICD-10-CM | POA: Diagnosis present

## 2015-08-31 DIAGNOSIS — E785 Hyperlipidemia, unspecified: Secondary | ICD-10-CM | POA: Diagnosis not present

## 2015-08-31 DIAGNOSIS — E875 Hyperkalemia: Secondary | ICD-10-CM | POA: Diagnosis present

## 2015-08-31 DIAGNOSIS — I130 Hypertensive heart and chronic kidney disease with heart failure and stage 1 through stage 4 chronic kidney disease, or unspecified chronic kidney disease: Secondary | ICD-10-CM | POA: Insufficient documentation

## 2015-08-31 DIAGNOSIS — E1151 Type 2 diabetes mellitus with diabetic peripheral angiopathy without gangrene: Secondary | ICD-10-CM | POA: Insufficient documentation

## 2015-08-31 DIAGNOSIS — N179 Acute kidney failure, unspecified: Secondary | ICD-10-CM | POA: Diagnosis present

## 2015-08-31 DIAGNOSIS — E1142 Type 2 diabetes mellitus with diabetic polyneuropathy: Secondary | ICD-10-CM | POA: Diagnosis not present

## 2015-08-31 DIAGNOSIS — E119 Type 2 diabetes mellitus without complications: Secondary | ICD-10-CM

## 2015-08-31 DIAGNOSIS — K1379 Other lesions of oral mucosa: Secondary | ICD-10-CM | POA: Diagnosis present

## 2015-08-31 DIAGNOSIS — Z79899 Other long term (current) drug therapy: Secondary | ICD-10-CM | POA: Insufficient documentation

## 2015-08-31 DIAGNOSIS — M7989 Other specified soft tissue disorders: Secondary | ICD-10-CM | POA: Diagnosis present

## 2015-08-31 LAB — CBC WITH DIFFERENTIAL/PLATELET
BASOS PCT: 0 %
Basophils Absolute: 0 10*3/uL (ref 0.0–0.1)
EOS ABS: 0.1 10*3/uL (ref 0.0–0.7)
EOS PCT: 1 %
HCT: 26.5 % — ABNORMAL LOW (ref 39.0–52.0)
Hemoglobin: 8.7 g/dL — ABNORMAL LOW (ref 13.0–17.0)
LYMPHS ABS: 0.6 10*3/uL — AB (ref 0.7–4.0)
Lymphocytes Relative: 9 %
MCH: 30.3 pg (ref 26.0–34.0)
MCHC: 32.8 g/dL (ref 30.0–36.0)
MCV: 92.3 fL (ref 78.0–100.0)
Monocytes Absolute: 0.2 10*3/uL (ref 0.1–1.0)
Monocytes Relative: 3 %
Neutro Abs: 5.7 10*3/uL (ref 1.7–7.7)
Neutrophils Relative %: 87 %
PLATELETS: 264 10*3/uL (ref 150–400)
RBC: 2.87 MIL/uL — AB (ref 4.22–5.81)
RDW: 15.6 % — ABNORMAL HIGH (ref 11.5–15.5)
WBC: 6.6 10*3/uL (ref 4.0–10.5)

## 2015-08-31 LAB — URINALYSIS, ROUTINE W REFLEX MICROSCOPIC
Bilirubin Urine: NEGATIVE
Glucose, UA: NEGATIVE mg/dL
HGB URINE DIPSTICK: NEGATIVE
KETONES UR: NEGATIVE mg/dL
Leukocytes, UA: NEGATIVE
Nitrite: NEGATIVE
PROTEIN: 100 mg/dL — AB
Specific Gravity, Urine: 1.02 (ref 1.005–1.030)
pH: 5.5 (ref 5.0–8.0)

## 2015-08-31 LAB — URINE MICROSCOPIC-ADD ON
BACTERIA UA: NONE SEEN
WBC, UA: NONE SEEN WBC/hpf (ref 0–5)

## 2015-08-31 LAB — BASIC METABOLIC PANEL
Anion gap: 6 (ref 5–15)
BUN: 66 mg/dL — AB (ref 6–20)
CO2: 24 mmol/L (ref 22–32)
CREATININE: 4.6 mg/dL — AB (ref 0.61–1.24)
Calcium: 8.2 mg/dL — ABNORMAL LOW (ref 8.9–10.3)
Chloride: 101 mmol/L (ref 101–111)
GFR, EST AFRICAN AMERICAN: 14 mL/min — AB (ref >60)
GFR, EST NON AFRICAN AMERICAN: 12 mL/min — AB (ref >60)
Glucose, Bld: 168 mg/dL — ABNORMAL HIGH (ref 65–99)
POTASSIUM: 5.2 mmol/L — AB (ref 3.5–5.1)
SODIUM: 131 mmol/L — AB (ref 135–145)

## 2015-08-31 LAB — GLUCOSE, CAPILLARY: Glucose-Capillary: 150 mg/dL — ABNORMAL HIGH (ref 65–99)

## 2015-08-31 LAB — POC OCCULT BLOOD, ED: FECAL OCCULT BLD: POSITIVE — AB

## 2015-08-31 LAB — TROPONIN I

## 2015-08-31 MED ORDER — PNEUMOCOCCAL VAC POLYVALENT 25 MCG/0.5ML IJ INJ
0.5000 mL | INJECTION | INTRAMUSCULAR | Status: AC
  Administered 2015-09-01: 0.5 mL via INTRAMUSCULAR
  Filled 2015-08-31: qty 0.5

## 2015-08-31 MED ORDER — ATENOLOL 25 MG PO TABS
100.0000 mg | ORAL_TABLET | Freq: Every day | ORAL | Status: DC
  Administered 2015-09-01 – 2015-09-02 (×2): 100 mg via ORAL
  Filled 2015-08-31 (×2): qty 4

## 2015-08-31 MED ORDER — POLYETHYLENE GLYCOL 3350 17 G PO PACK: 17.0000 g | PACK | Freq: Every day | ORAL | Status: DC | PRN

## 2015-08-31 MED ORDER — GLIPIZIDE 5 MG PO TABS: 15.0000 mg | ORAL_TABLET | Freq: Every day | ORAL | Status: DC

## 2015-08-31 MED ORDER — CLONIDINE HCL 0.2 MG PO TABS
0.2000 mg | ORAL_TABLET | Freq: Two times a day (BID) | ORAL | Status: DC
Start: 2015-08-31 — End: 2015-09-02
  Administered 2015-08-31 – 2015-09-02 (×4): 0.2 mg via ORAL
  Filled 2015-08-31 (×4): qty 1

## 2015-08-31 MED ORDER — ENSURE ENLIVE PO LIQD
237.0000 mL | Freq: Two times a day (BID) | ORAL | Status: DC
Start: 2015-09-01 — End: 2015-09-02
  Administered 2015-09-01: 237 mL via ORAL

## 2015-08-31 MED ORDER — ACETAMINOPHEN 650 MG RE SUPP: 650.0000 mg | Freq: Four times a day (QID) | RECTAL | Status: DC | PRN

## 2015-08-31 MED ORDER — ONDANSETRON HCL 4 MG PO TABS
4.0000 mg | ORAL_TABLET | Freq: Three times a day (TID) | ORAL | Status: DC
  Filled 2015-08-31 (×5): qty 1

## 2015-08-31 MED ORDER — SODIUM CHLORIDE 0.9 % IV SOLN
INTRAVENOUS | Status: DC
  Administered 2015-08-31: 125 mL/h via INTRAVENOUS
  Administered 2015-09-01 – 2015-09-02 (×3): via INTRAVENOUS

## 2015-08-31 MED ORDER — GLIPIZIDE 5 MG PO TABS
5.0000 mg | ORAL_TABLET | Freq: Every day | ORAL | Status: DC
  Administered 2015-08-31: 5 mg via ORAL
  Filled 2015-08-31: qty 1

## 2015-08-31 MED ORDER — BISACODYL 10 MG RE SUPP: 10.0000 mg | Freq: Every day | RECTAL | Status: DC | PRN

## 2015-08-31 MED ORDER — ALUM & MAG HYDROXIDE-SIMETH 200-200-20 MG/5ML PO SUSP: 30.0000 mL | Freq: Four times a day (QID) | ORAL | Status: DC | PRN

## 2015-08-31 MED ORDER — SODIUM CHLORIDE 0.9 % IV SOLN
250.0000 mL | Freq: Once | INTRAVENOUS | Status: AC
  Administered 2015-08-31: 250 mL via INTRAVENOUS

## 2015-08-31 MED ORDER — AMLODIPINE BESYLATE 5 MG PO TABS
10.0000 mg | ORAL_TABLET | Freq: Every day | ORAL | Status: DC
  Administered 2015-09-01 – 2015-09-02 (×2): 10 mg via ORAL
  Filled 2015-08-31 (×2): qty 2

## 2015-08-31 MED ORDER — ACETAMINOPHEN 325 MG PO TABS
650.0000 mg | ORAL_TABLET | Freq: Once | ORAL | Status: AC
  Administered 2015-08-31: 650 mg via ORAL
  Filled 2015-08-31: qty 2

## 2015-08-31 MED ORDER — HYDROCODONE-ACETAMINOPHEN 5-325 MG PO TABS
1.0000 | ORAL_TABLET | Freq: Three times a day (TID) | ORAL | Status: DC | PRN
  Administered 2015-09-02: 1 via ORAL
  Filled 2015-08-31: qty 1

## 2015-08-31 MED ORDER — DIPHENHYDRAMINE HCL 25 MG PO CAPS
25.0000 mg | ORAL_CAPSULE | Freq: Once | ORAL | Status: AC
  Administered 2015-08-31: 25 mg via ORAL
  Filled 2015-08-31: qty 1

## 2015-08-31 MED ORDER — GABAPENTIN 300 MG PO CAPS: 300.0000 mg | ORAL_CAPSULE | Freq: Every day | ORAL | Status: DC | PRN

## 2015-08-31 MED ORDER — ACETAMINOPHEN 325 MG PO TABS: 650.0000 mg | ORAL_TABLET | Freq: Four times a day (QID) | ORAL | Status: DC | PRN

## 2015-08-31 MED ORDER — PANTOPRAZOLE SODIUM 40 MG PO TBEC
40.0000 mg | DELAYED_RELEASE_TABLET | Freq: Two times a day (BID) | ORAL | Status: DC
Start: 2015-08-31 — End: 2015-09-02
  Administered 2015-08-31 – 2015-09-02 (×4): 40 mg via ORAL
  Filled 2015-08-31 (×4): qty 1

## 2015-08-31 MED ORDER — INSULIN ASPART 100 UNIT/ML ~~LOC~~ SOLN: 0.0000 [IU] | Freq: Three times a day (TID) | SUBCUTANEOUS | Status: DC

## 2015-08-31 MED ORDER — SODIUM CHLORIDE 0.9 % IV SOLN: INTRAVENOUS | Status: DC

## 2015-08-31 MED ORDER — GLIPIZIDE 5 MG PO TABS
10.0000 mg | ORAL_TABLET | Freq: Every day | ORAL | Status: DC
  Administered 2015-09-01: 10 mg via ORAL
  Filled 2015-08-31: qty 2

## 2015-08-31 NOTE — H&P (Addendum)
History and Physical  [name]:[id number] DOB: [birth year] DOA: 08/31/2015  Referring physician: Dr Eulis Foster, ED physician PCP: Chevis Pretty, FNP   Chief Complaint: Fatigue  HPI: [name] is a 65 y.o. male  With a history of hypertension, type 2 diabetes, AVM of colon, coronary artery disease, systolic heart failure with EF of 45% on 08/29/15 with grade 1 diastolic dysfunction, recent upper endoscopy with dilation on 08/19/15, GERD, tobacco abuse. Patient has been having fatigue over the past 2-3 weeks, which has been increasing especially over the past several days.  Activity makes his fatigue worse and rest makes his fatigue better. The patient is being followed by hematology/oncology for his iron deficiency anemia due to chronic blood loss. A hemoglobin was checked on Friday, which was found to be 8. Arrangements were made for the patient to receive 2 units of blood tomorrow. In the middle of night, the patient started having severe nausea, which improved over the course of the day. However, the patient presented to the emergency department for the fatigue.   Review of Systems:   Pt complains of melena on Thursday. Constipation - last stool was on Thursday..  Pt denies any fevers, chills, vomiting, diarrhea, constipation, abdominal pain, shortness of breath, dyspnea on exertion, orthopnea, cough, wheezing, palpitations, headache, vision changes, lightheadedness, dizziness, diarrhea, constipation, rectal bleeding.  Review of systems are otherwise negative  Past Medical History  Diagnosis Date  . Diverticulosis 03/04/10  . Peripheral vascular disease, unspecified ([place])   . Undiagnosed cardiac murmurs   . Other symptoms involving cardiovascular system   . HLD (hyperlipidemia)   . HTN (hypertension)   . Type II or unspecified type diabetes mellitus without mention of complication, not stated as uncontrolled   . Ulcer   . Carotid artery occlusion   . Umbilical hernia  now    has not been repaired  . Blood transfusion   . AVM (arteriovenous malformation) of colon with hemorrhage   . Gastritis   . Internal hemorrhoids   . Right carotid bruit   . Shortness of breath     noted /w low Hgb  . GERD (gastroesophageal reflux disease)   . Chronic kidney disease     renal failure /w osteomyelitis- 2010  . Arthritis     back  . Anemia     8/3,4,5- blood transfusion- [facility], colonoscopy- done & found 2 areas of bleeding   . Tobacco use disorder   . Anxiety   . CHF (congestive heart failure) ([place])     3 yrs ago  . Irregular heart beat   . Iron deficiency anemia due to chronic blood loss 10/07/2010   Past Surgical History  Procedure Laterality Date  . Colonoscopy  03/04/2010    Dr. Princella Pellegrini AMV without mention of ablation, scattered diverticula, internal hemorrhoids  . Bilateral common superficial femoral and profudus artery endarterectomies      2003    Dr. Sherren Mocha Early  . Esophagogastroduodenoscopy  03/2000    Dr. Tharon Aquas gastritis, H.Pylori gastritis, ?treated  . Esophagogastroduodenoscopy  05/2006    Dr. Marcello Fennel recurrent GI bleed. antral gastritis, single gastric AVM s/p APC, CLO results?  . Colonoscopy  11/2004    Dr. Barkley Bruns  . 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 Both of these are ablated with argon plasma coagulator./ Small external hemorrhoids  .  Esophagogastroduodenoscopy  01/21/2012    SLF: MILD TO MAODERATE Gastritis/ SLOW GIB LIKEY DUE TO PT BEING ON ASA ND SUBSEQUENT BLOOD/ LOSS FROM AVMS IN STOMACH AND COLON AS WELL AS GASTRITIS  . Enteroscopy  01/21/2012    Procedure: ENTEROSCOPY;  Surgeon: Danie Binder, MD;  [facility];  Service: Endoscopy;;  . No past surgeries    . Vascular surgery      rt bka and rt 1st toe amputation  . Below knee leg amputation       2010, wears prosthesis   . Endarterectomy  02/09/2012    Procedure: ENDARTERECTOMY CAROTID;  Surgeon: Rosetta Posner, MD;  Location: [facility];  Service: Vascular;  Laterality: Left;  . Carotid endarterectomy Left 02-09-12  . Esophagogastroduodenoscopy N/A 09/03/2013    Mild chronic gastritis. benign gastric polyps  . Givens capsule study N/A 09/03/2013    Dr. Oneida Alar: small bowel and colonic AVMs. no active bleeding  . Esophagogastroduodenoscopy N/A 08/19/2015    Procedure: ESOPHAGOGASTRODUODENOSCOPY (EGD);  Surgeon: Danie Binder, MD;  [facility];  Service: Endoscopy;  Laterality: N/A;  200  . Savory dilation N/A 08/19/2015    Procedure: SAVORY DILATION;  Surgeon: Danie Binder, MD;  [facility];  Service: Endoscopy;  Laterality: N/A;   Social History:  reports that he has been smoking Cigarettes.  He has a 100 pack-year smoking history. He quit smokeless tobacco use about 3 years ago. He reports that he does not drink alcohol or use illicit drugs. Patient lives at home & is able to participate in activities of daily living  Allergies  Allergen Reactions  . Zocor [Simvastatin] Palpitations    Chest soreness  . Asa [Aspirin] Other (See Comments)    GI Bleed  . Hctz [Hydrochlorothiazide]     Leg cramps  . Iodine Hives    IVP dye  . Sulfa Antibiotics Other (See Comments)    Unknown     Family History  Problem Relation Age of Onset  . Colon cancer Brother 74    deceased  . Hyperlipidemia Brother   . Hypertension Brother   . Lung cancer Sister     deceased, three primary cancers: lung, esophageal, lymphoma.   . Diabetes Sister   . Hypertension Sister   . Cancer Father     gallbladder  . Hyperlipidemia Father   . Hypertension Father   . Cancer Brother     unknown primary  . Cancer Sister     unknown primary  . Hyperlipidemia Sister   . Diabetes Mother   . Hypertension Mother      Prior to Admission medications   Medication Sig Start Date End Date  Taking? Authorizing Provider  amLODipine (NORVASC) 10 MG tablet TAKE 1 TABLET BY MOUTH DAILY, BEFORE BREAKFAST 05/06/15  Yes Sharion Balloon, FNP  atenolol (TENORMIN) 100 MG tablet TAKE 1 TABLET (100 MG TOTAL) BY MOUTH DAILY. 03/23/15  Yes Sharion Balloon, FNP  Calcium Carbonate Antacid (TUMS ULTRA 1000 PO) Take 2 tablets by mouth daily.   Yes Historical Provider, MD  cloNIDine (CATAPRES) 0.2 MG tablet Take 1 tablet (0.2 mg total) by mouth 2 (two) times daily. 07/11/15  Yes [name] Done, FNP  furosemide (LASIX) 40 MG tablet TAKE 1 TABLET (40 MG TOTAL) BY MOUTH AS NEEDED. Patient taking differently: TAKE 1 TABLET (40 MG TOTAL) BY MOUTH AS NEEDED FLUID 03/20/14  Yes [name] Done, FNP  gabapentin (NEURONTIN) 300 MG capsule Take 1 capsule (  300 mg total) by mouth 3 (three) times daily. Patient taking differently: Take 300 mg by mouth daily as needed (nerve pain).  03/22/14  Yes Sharion Balloon, FNP  glipiZIDE (GLUCOTROL) 10 MG tablet Take 1.5 tablets (15 mg total) by mouth daily. Takes one tablet in the Am and 1/2 tablet in the pm 06/19/15  Yes Sharion Balloon, FNP  HYDROcodone-acetaminophen (NORCO/VICODIN) 5-325 MG tablet Take 1 tablet by mouth every 8 (eight) hours as needed. Patient taking differently: Take 1 tablet by mouth every 8 (eight) hours as needed for moderate pain.  05/06/15  Yes Sharion Balloon, FNP  ondansetron (ZOFRAN) 4 MG tablet Take 1 tablet (4 mg total) by mouth 4 (four) times daily -  before meals and at bedtime. 08/15/15  Yes Orvil Feil, NP  pantoprazole (PROTONIX) 40 MG tablet TAKE 1 TABLET BY MOUTH TWICE A DAY BEFORE MEAL 06/19/15  Yes Sharion Balloon, FNP  polyethylene glycol powder (GLYCOLAX/MIRALAX) powder Take 1 Container by mouth once. Only as needed   Yes Historical Provider, MD  silver sulfADIAZINE (SILVADENE) 1 % cream APPLY TO AFFECTED AREA TWICE A DAY 03/14/14  Yes [name] Done, FNP  valsartan (DIOVAN) 320 MG tablet Take 1 tablet (320 mg total) by mouth  daily. 05/06/15  Yes Sharion Balloon, FNP  Vitamin D, Ergocalciferol, (DRISDOL) 50000 units CAPS capsule Take 50,000 Units by mouth once a week. Takes on Saturdays. 08/10/15  Yes Historical Provider, MD    Physical Exam: BP 103/54 mmHg  Pulse 79  Temp(Src) 97.8 F (36.6 C) (Oral)  Resp 20  Ht '5\' 4"'$  (1.626 m)  Wt 68.8 kg (151 lb 10.8 oz)  BMI 26.02 kg/m2  SpO2 97%  General: Elderly Caucasian male who appears her stated age. Awake and alert and oriented x3. No acute cardiopulmonary distress.  Eyes: Pupils equal, round, reactive to light. Extraocular muscles are intact. Sclerae anicteric and noninjected.   ENT:  Dry mucosal membranes. No mucosal lesions. There is an 63m round lesion on the right soft palate  Neck: Neck supple without lymphadenopathy. No carotid bruits. No masses palpated.  Cardiovascular: Regular rate with normal S1-S2 sounds. No murmurs, rubs, gallops auscultated. No JVD.  Respiratory: Good respiratory effort with no wheezes, rales, rhonchi. Lungs clear to auscultation bilaterally.  Abdomen: Soft, nontender, nondistended. Active bowel sounds. No masses or hepatosplenomegaly  Skin: Dry, warm to touch. 2+ dorsalis pedis and radial pulses. Musculoskeletal: No calf or leg pain. All major joints not erythematous nontender.  Psychiatric: Intact judgment and insight.  Neurologic: No focal neurological deficits. Cranial nerves II through XII are grossly intact.           Labs on Admission:  Basic Metabolic Panel:  Recent Labs Lab 08/31/15 1344  NA 131*  K 5.2*  CL 101  CO2 24  GLUCOSE 168*  BUN 66*  CREATININE 4.60*  CALCIUM 8.2*   Liver Function Tests: No results for input(s): AST, ALT, ALKPHOS, BILITOT, PROT, ALBUMIN in the last 168 hours. No results for input(s): LIPASE, AMYLASE in the last 168 hours. No results for input(s): AMMONIA in the last 168 hours. CBC:  Recent Labs Lab 08/31/15 1344  WBC 6.6  NEUTROABS 5.7  HGB 8.7*  HCT 26.5*  MCV 92.3    PLT 264   Cardiac Enzymes:  Recent Labs Lab 08/31/15 1344  TROPONINI <0.03    BNP (last 3 results) No results for input(s): BNP in the last 8760 hours.  ProBNP (last 3 results) No results  for input(s): PROBNP in the last 8760 hours.  CBG: No results for input(s): GLUCAP in the last 168 hours.  Radiological Exams on Admission: No results found.   Assessment/Plan Present on Admission:  . Acute renal failure superimposed on stage 4 chronic kidney disease ([place]) . Mass of oral cavity . Left arm swelling . Hyperkalemia . GI bleeding  This patient was discussed with the ED physician, including pertinent vitals, physical exam findings, labs, and imaging.  We also discussed care given by the ED provider.  #1 acute renal failure superimposed on stage IV chronic kidney disease  Admit  IV fluid hydration - we'll be cautious given the patient has a history of combined systolic and diastolic heart failure  Recheck creatinine in the morning  Hold angiotensin receptor blocker #2 Weakness  Possibly secondary to anemia  Transfuse 2 units of blood that are being held for tomorrow  Medicate with Tylenol and Benadryl to decrease transfusion reaction  Recheck CBC in the morning #3 hyperkalemia  Slightly elevated  We'll recheck in the morning after fluid hydration  Lately resolve on its own #4 hypertension  We'll hold ARB - hold parameters on other antihypertensives #5 diabetes  CBGs before meals and daily at bedtime  Continue glipizide  Slight scale insulin #6 GI bleeding  Due to melena and heme positive stool, will consult Dr. Oneida Alar  Tinea protonic #7 mass oral cavity  Will need outpatient biopsy #8 left arm swelling  Doubtful this represents DVT, but will order duplex.   DVT prophylaxis: SCDs given patient's heme positive stool and recent melena  Consultants: GI  Code Status: Full code  Family Communication: Wife in the room  Disposition Plan:   admission to [place], DO [facility] Pager [phone]

## 2015-08-31 NOTE — ED Provider Notes (Signed)
CSN: [id number]     Arrival date & time 08/31/15  1310 History   First MD Initiated Contact with Patient 08/31/15 1325     Chief Complaint  Patient presents with  . Fatigue     (Consider location/radiation/quality/duration/timing/severity/associated sxs/prior Treatment) HPI   [name] is a 65 y.o. male with complicated medical history requiring recent evaluation by GI and blood transfusion presenting for evaluation of nausea, left arm swelling, fatigue, appetite, 2 episodes of black bowel movements, and suspected anemia. He saw his PCP, 5 days ago. At that time his hemoglobin was tested and he was told it was "8". He presented for evaluation to the ED on 08/28/15, but left, without treatment. He contacted his hematologist, who tried to arrange for transfusion on 08/29/2015, but that was not done. He denies vomiting, diarrhea, fever, chills, focal weakness or paresthesia. He denies trauma to the left arm. There are no other known modifying factors.   Past Medical History  Diagnosis Date  . Diverticulosis 03/04/10  . Peripheral vascular disease, unspecified ([place])   . Undiagnosed cardiac murmurs   . Other symptoms involving cardiovascular system   . HLD (hyperlipidemia)   . HTN (hypertension)   . Type II or unspecified type diabetes mellitus without mention of complication, not stated as uncontrolled   . Ulcer   . Carotid artery occlusion   . Umbilical hernia now    has not been repaired  . Blood transfusion   . AVM (arteriovenous malformation) of colon with hemorrhage   . Gastritis   . Internal hemorrhoids   . Right carotid bruit   . Shortness of breath     noted /w low Hgb  . GERD (gastroesophageal reflux disease)   . Chronic kidney disease     renal failure /w osteomyelitis- 2010  . Arthritis     back  . Anemia     8/3,4,5- blood transfusion- [facility], colonoscopy- done & found 2 areas of bleeding   . Tobacco use disorder   . Anxiety   . CHF (congestive heart failure)  ([place])     3 yrs ago  . Irregular heart beat   . Iron deficiency anemia due to chronic blood loss 10/07/2010   Past Surgical History  Procedure Laterality Date  . Colonoscopy  03/04/2010    Dr. Princella Pellegrini AMV without mention of ablation, scattered diverticula, internal hemorrhoids  . Bilateral common superficial femoral and profudus artery endarterectomies      2003    Dr. Sherren Mocha Early  . Esophagogastroduodenoscopy  03/2000    Dr. Tharon Aquas gastritis, H.Pylori gastritis, ?treated  . Esophagogastroduodenoscopy  05/2006    Dr. Marcello Fennel recurrent GI bleed. antral gastritis, single gastric AVM s/p APC, CLO results?  . Colonoscopy  11/2004    Dr. Barkley Bruns  . 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 Both of these are ablated with argon plasma coagulator./ Small external hemorrhoids  . Esophagogastroduodenoscopy  01/21/2012    SLF: MILD TO MAODERATE Gastritis/ SLOW GIB LIKEY DUE TO PT BEING ON ASA ND SUBSEQUENT BLOOD/ LOSS FROM AVMS IN STOMACH AND COLON AS WELL AS GASTRITIS  . Enteroscopy  01/21/2012    Procedure: ENTEROSCOPY;  Surgeon: Danie Binder, MD;  [facility];  Service: Endoscopy;;  . No past surgeries    . Vascular surgery  rt bka and rt 1st toe amputation  . Below knee leg amputation      2010, wears prosthesis   . Endarterectomy  02/09/2012    Procedure: ENDARTERECTOMY CAROTID;  Surgeon: Rosetta Posner, MD;  Location: [place];  Service: Vascular;  Laterality: Left;  . Carotid endarterectomy Left 02-09-12  . Esophagogastroduodenoscopy N/A 09/03/2013    Mild chronic gastritis. benign gastric polyps  . Givens capsule study N/A 09/03/2013    Dr. Oneida Alar: small bowel and colonic AVMs. no active bleeding  . Esophagogastroduodenoscopy N/A 08/19/2015    Procedure: ESOPHAGOGASTRODUODENOSCOPY (EGD);  Surgeon: Danie Binder, MD;  [facility];  Service: Endoscopy;  Laterality: N/A;  200  . Savory dilation N/A 08/19/2015    Procedure: SAVORY DILATION;  Surgeon: Danie Binder, MD;  [facility];  Service: Endoscopy;  Laterality: N/A;   Family History  Problem Relation Age of Onset  . Colon cancer Brother 36    deceased  . Hyperlipidemia Brother   . Hypertension Brother   . Lung cancer Sister     deceased, three primary cancers: lung, esophageal, lymphoma.   . Diabetes Sister   . Hypertension Sister   . Cancer Father     gallbladder  . Hyperlipidemia Father   . Hypertension Father   . Cancer Brother     unknown primary  . Cancer Sister     unknown primary  . Hyperlipidemia Sister   . Diabetes Mother   . Hypertension Mother    Social History  Substance Use Topics  . Smoking status: Current Every Day Smoker -- 2.00 packs/day for 50 years    Types: Cigarettes  . Smokeless tobacco: Former Systems developer    Quit date: 02/08/2012     Comment: Smokes about 2 packs of tiny skinny cigarettes  . Alcohol Use: No    Review of Systems  All other systems reviewed and are negative.     Allergies  Zocor; Asa; Hctz; Iodine; and Sulfa antibiotics  Home Medications   Prior to Admission medications   Medication Sig Start Date End Date Taking? Authorizing Provider  amLODipine (NORVASC) 10 MG tablet TAKE 1 TABLET BY MOUTH DAILY, BEFORE BREAKFAST 05/06/15  Yes Sharion Balloon, FNP  atenolol (TENORMIN) 100 MG tablet TAKE 1 TABLET (100 MG TOTAL) BY MOUTH DAILY. 03/23/15  Yes Sharion Balloon, FNP  Calcium Carbonate Antacid (TUMS ULTRA 1000 PO) Take 2 tablets by mouth daily.   Yes Historical Provider, MD  cloNIDine (CATAPRES) 0.2 MG tablet Take 1 tablet (0.2 mg total) by mouth 2 (two) times daily. 07/11/15  Yes [name] Done, FNP  furosemide (LASIX) 40 MG tablet TAKE 1 TABLET (40 MG TOTAL) BY MOUTH AS NEEDED. Patient taking differently: TAKE 1 TABLET (40 MG TOTAL) BY MOUTH AS NEEDED FLUID  03/20/14  Yes [name] Done, FNP  gabapentin (NEURONTIN) 300 MG capsule Take 1 capsule (300 mg total) by mouth 3 (three) times daily. Patient taking differently: Take 300 mg by mouth daily as needed (nerve pain).  03/22/14  Yes Sharion Balloon, FNP  glipiZIDE (GLUCOTROL) 10 MG tablet Take 1.5 tablets (15 mg total) by mouth daily. Takes one tablet in the Am and 1/2 tablet in the pm 06/19/15  Yes Sharion Balloon, FNP  HYDROcodone-acetaminophen (NORCO/VICODIN) 5-325 MG tablet Take 1 tablet by mouth every 8 (eight) hours as needed. Patient taking differently: Take 1 tablet by mouth every 8 (eight) hours as needed for moderate pain.  05/06/15  Yes Christy  A Hawks, FNP  ondansetron (ZOFRAN) 4 MG tablet Take 1 tablet (4 mg total) by mouth 4 (four) times daily -  before meals and at bedtime. 08/15/15  Yes Orvil Feil, NP  pantoprazole (PROTONIX) 40 MG tablet TAKE 1 TABLET BY MOUTH TWICE A DAY BEFORE MEAL 06/19/15  Yes Sharion Balloon, FNP  polyethylene glycol powder (GLYCOLAX/MIRALAX) powder Take 1 Container by mouth once. Only as needed   Yes Historical Provider, MD  silver sulfADIAZINE (SILVADENE) 1 % cream APPLY TO AFFECTED AREA TWICE A DAY 03/14/14  Yes [name] Done, FNP  valsartan (DIOVAN) 320 MG tablet Take 1 tablet (320 mg total) by mouth daily. 05/06/15  Yes Sharion Balloon, FNP  Vitamin D, Ergocalciferol, (DRISDOL) 50000 units CAPS capsule Take 50,000 Units by mouth once a week. Takes on Saturdays. 08/10/15  Yes Historical Provider, MD  tamsulosin (FLOMAX) 0.4 MG CAPS capsule Take 1 capsule at bedtime nightly for urinary symptoms. Patient not taking: Reported on 08/31/2015 05/30/15   Manon Hilding Kefalas, PA-C   BP 110/44 mmHg  Pulse 75  Temp(Src) 97.9 F (36.6 C) (Oral)  Resp 8  Ht '5\' 4"'$  (1.626 m)  Wt 157 lb (71.215 kg)  BMI 26.94 kg/m2  SpO2 91% Physical Exam  Constitutional: He is oriented to person, place, and time. He appears well-developed. He appears distressed (He appears  uncomfortable).  Elderly, appearing older than stated age.  HENT:  Head: Normocephalic and atraumatic.  Right Ear: External ear normal.  Left Ear: External ear normal.  Posterior pharynx, right-sided, with mild erythema and a 0.5 cm raised, whitish appearing lesion, nonspecific appearance but possible papilloma. No other abnormality of the tongue or mucosa was visualized.  Eyes: Conjunctivae and EOM are normal. Pupils are equal, round, and reactive to light.  Neck: Normal range of motion and phonation normal. Neck supple.  Cardiovascular: Normal rate, regular rhythm and normal heart sounds.   Pulmonary/Chest: Effort normal and breath sounds normal. No respiratory distress. He has no wheezes. He exhibits no bony tenderness.  Abdominal: Soft. He exhibits no distension. There is no tenderness. There is no guarding.  Musculoskeletal: Normal range of motion.  Mild swelling left upper and lower arms, without erythema or tenderness.  Neurological: He is alert and oriented to person, place, and time. No cranial nerve deficit or sensory deficit. He exhibits normal muscle tone. Coordination normal.  Skin: Skin is warm, dry and intact.  Psychiatric: He has a normal mood and affect. His behavior is normal. Judgment and thought content normal.  Nursing note and vitals reviewed.   ED Course  Procedures (including critical care time)  Medications  0.9 %  sodium chloride infusion (125 mL/hr Intravenous New Bag/Given 08/31/15 1418)    Patient Vitals for the past 24 hrs:  BP Temp Temp src Pulse Resp SpO2 Height Weight  08/31/15 1530 (!) 110/44 mmHg - - 75 - 91 % - -  08/31/15 1500 (!) 113/43 mmHg - - 77 - 92 % - -  08/31/15 1430 (!) 105/44 mmHg - - 75 - 92 % - -  08/31/15 1400 106/56 mmHg - - - (!) 8 - - -  08/31/15 1330 (!) 115/52 mmHg - - 77 16 96 % - -  08/31/15 1315 (!) 112/53 mmHg 97.9 F (36.6 C) Oral 82 20 98 % '5\' 4"'$  (1.626 m) 157 lb (71.215 kg)    4:10 PM Reevaluation with update and  discussion. After initial assessment and treatment, an updated evaluation reveals no change in  clinical status. Findings discussed with patient and wife, all questions answered. Eyanna Mcgonagle L   4:25 PM-Consult complete with Hospitalist. Patient case explained and discussed. He agrees to admit patient for further evaluation and treatment. Call ended at 16:30   CRITICAL CARE Performed by: Richarda Blade Total critical care time: 40 minutes Critical care time was exclusive of separately billable procedures and treating other patients. Critical care was necessary to treat or prevent imminent or life-threatening deterioration. Critical care was time spent personally by me on the following activities: development of treatment plan with patient and/or surrogate as well as nursing, discussions with consultants, evaluation of patient's response to treatment, examination of patient, obtaining history from patient or surrogate, ordering and performing treatments and interventions, ordering and review of laboratory studies, ordering and review of radiographic studies, pulse oximetry and re-evaluation of patient's condition.   Labs Review Labs Reviewed  CBC WITH DIFFERENTIAL/PLATELET - Abnormal; Notable for the following:    RBC 2.87 (*)    Hemoglobin 8.7 (*)    HCT 26.5 (*)    RDW 15.6 (*)    Lymphs Abs 0.6 (*)    All other components within normal limits  BASIC METABOLIC PANEL - Abnormal; Notable for the following:    Sodium 131 (*)    Potassium 5.2 (*)    Glucose, Bld 168 (*)    BUN 66 (*)    Creatinine, Ser 4.60 (*)    Calcium 8.2 (*)    GFR calc non Af Amer 12 (*)    GFR calc Af Amer 14 (*)    All other components within normal limits  POC OCCULT BLOOD, ED - Abnormal; Notable for the following:    Fecal Occult Bld POSITIVE (*)    All other components within normal limits  URINE CULTURE  TROPONIN I  URINALYSIS, ROUTINE W REFLEX MICROSCOPIC (NOT AT [facility])   BUN  Date Value Ref Range  Status  08/31/2015 66* 6 - 20 mg/dL Final  05/06/2015 34* 8 - 27 mg/dL Final  03/06/2015 39* 6 - 20 mg/dL Final  02/03/2015 38* 8 - 27 mg/dL Final  12/04/2014 41* 6 - 20 mg/dL Final  03/14/2014 20 8 - 27 mg/dL Final  09/14/2013 22 6 - 23 mg/dL Final  08/09/2013 22 8 - 27 mg/dL Final   CREAT  Date Value Ref Range Status  12/06/2012 1.59* 0.50 - 1.35 mg/dL Final   CREATININE, SER  Date Value Ref Range Status  08/31/2015 4.60* 0.61 - 1.24 mg/dL Final  05/06/2015 3.65* 0.76 - 1.27 mg/dL Final    Comment:                      Client Requested Flag  03/06/2015 3.75* 0.61 - 1.24 mg/dL Final  02/03/2015 3.81* 0.76 - 1.27 mg/dL Final    Comment:                      Client Requested Flag      Imaging Review No results found. I have personally reviewed and evaluated these images and lab results as part of my medical decision-making.   EKG Interpretation None      MDM   Final diagnoses:  Weakness  AKI (acute kidney injury) (HCC)  Anemia, unspecified anemia type  Mass of oral cavity  Left arm swelling    Weakness and malaise related to volume depletion and significant loss of renal function, about 25% based on lab results. Suspect component of poor nutritional  nutritional compliance. Hemoglobin level. Does not require transfusion at this time. Stool guaiac is positive. Patient has a history of both upper and lower sources for gastrointestinal bleeding. Stool color is, brown, indicating a nonactive bleeding source.  Incidental note is made of a oral lesion, raising concern for cancer. This will likely need biopsy, and further treatment, but could be contributing to decreased oral intake.  Nursing Notes Reviewed/ Care Coordinated, and agree without changes. Applicable Imaging Reviewed.  Interpretation of Laboratory Data incorporated into ED treatment  Plan: Admit  Daleen Bo, MD 08/31/15 1649

## 2015-08-31 NOTE — ED Notes (Signed)
Pt complain of generalized weakness and nausea. States he is to have a blood transfusion tomorrow

## 2015-08-31 NOTE — ED Notes (Signed)
Gave pt a cup of water

## 2015-08-31 NOTE — ED Notes (Signed)
Pt states he is unable to provide a urine at this time and does not wish to be cathed.  States he has only been urinating about twice daily.

## 2015-09-01 ENCOUNTER — Encounter (HOSPITAL_COMMUNITY): Payer: Self-pay | Admitting: Gastroenterology

## 2015-09-01 ENCOUNTER — Observation Stay (HOSPITAL_COMMUNITY): Payer: Medicare Other

## 2015-09-01 ENCOUNTER — Encounter (HOSPITAL_COMMUNITY): Admission: RE | Admit: 2015-09-01 | Payer: Medicare Other | Source: Ambulatory Visit

## 2015-09-01 DIAGNOSIS — E1142 Type 2 diabetes mellitus with diabetic polyneuropathy: Secondary | ICD-10-CM | POA: Diagnosis not present

## 2015-09-01 DIAGNOSIS — N179 Acute kidney failure, unspecified: Secondary | ICD-10-CM | POA: Diagnosis not present

## 2015-09-01 DIAGNOSIS — E875 Hyperkalemia: Secondary | ICD-10-CM | POA: Diagnosis not present

## 2015-09-01 DIAGNOSIS — K921 Melena: Secondary | ICD-10-CM

## 2015-09-01 DIAGNOSIS — D649 Anemia, unspecified: Secondary | ICD-10-CM | POA: Diagnosis not present

## 2015-09-01 DIAGNOSIS — M7989 Other specified soft tissue disorders: Secondary | ICD-10-CM | POA: Diagnosis not present

## 2015-09-01 DIAGNOSIS — N184 Chronic kidney disease, stage 4 (severe): Secondary | ICD-10-CM

## 2015-09-01 DIAGNOSIS — R220 Localized swelling, mass and lump, head: Secondary | ICD-10-CM

## 2015-09-01 DIAGNOSIS — R531 Weakness: Secondary | ICD-10-CM

## 2015-09-01 LAB — TYPE AND SCREEN
ABO/RH(D): O POS
Antibody Screen: NEGATIVE
UNIT DIVISION: 0
UNIT DIVISION: 0

## 2015-09-01 LAB — GLUCOSE, CAPILLARY
GLUCOSE-CAPILLARY: 121 mg/dL — AB (ref 65–99)
GLUCOSE-CAPILLARY: 67 mg/dL (ref 65–99)
Glucose-Capillary: 58 mg/dL — ABNORMAL LOW (ref 65–99)
Glucose-Capillary: 70 mg/dL (ref 65–99)
Glucose-Capillary: 53 mg/dL — ABNORMAL LOW (ref 65–99)
Glucose-Capillary: 58 mg/dL — ABNORMAL LOW (ref 65–99)
Glucose-Capillary: 60 mg/dL — ABNORMAL LOW (ref 65–99)
Glucose-Capillary: 98 mg/dL (ref 65–99)

## 2015-09-01 LAB — BASIC METABOLIC PANEL
Anion gap: 7 (ref 5–15)
BUN: 63 mg/dL — AB (ref 6–20)
CALCIUM: 8.5 mg/dL — AB (ref 8.9–10.3)
CHLORIDE: 103 mmol/L (ref 101–111)
CO2: 25 mmol/L (ref 22–32)
CREATININE: 4.46 mg/dL — AB (ref 0.61–1.24)
GFR calc non Af Amer: 13 mL/min — ABNORMAL LOW (ref >60)
GFR, EST AFRICAN AMERICAN: 15 mL/min — AB (ref >60)
Glucose, Bld: 68 mg/dL (ref 65–99)
Potassium: 4.8 mmol/L (ref 3.5–5.1)
SODIUM: 135 mmol/L (ref 135–145)

## 2015-09-01 LAB — CBC
HEMATOCRIT: 32.2 % — AB (ref 39.0–52.0)
HEMOGLOBIN: 10.7 g/dL — AB (ref 13.0–17.0)
MCH: 30.2 pg (ref 26.0–34.0)
MCHC: 33.2 g/dL (ref 30.0–36.0)
MCV: 91 fL (ref 78.0–100.0)
Platelets: 254 10*3/uL (ref 150–400)
RBC: 3.54 MIL/uL — ABNORMAL LOW (ref 4.22–5.81)
RDW: 16.4 % — AB (ref 11.5–15.5)
WBC: 7.2 10*3/uL (ref 4.0–10.5)

## 2015-09-01 MED ORDER — POLYETHYLENE GLYCOL 3350 17 G PO PACK
17.0000 g | PACK | Freq: Every day | ORAL | Status: DC
  Administered 2015-09-01: 17 g via ORAL
  Filled 2015-09-01: qty 1

## 2015-09-01 NOTE — Progress Notes (Signed)
Hypoglycemic Event  CBG: 58 at 0738  Treatment: 15 GM carbohydrate snack  Symptoms: None  Follow-up CBG: Time:0830 CBG Result:121  Possible Reasons for Event: Unknown  Comments/MD notified:Memon  Pt was asymptomatic, ate breakfast and repeated CBG. Will continue to monitor.    Lynnda Shields

## 2015-09-01 NOTE — Progress Notes (Signed)
Hypoglycemic Event  CBG: 53 at 1636  Treatment: 15 GM carbohydrate snack  Symptoms: None  Follow-up CBG: Time:1740 CBG Result:67  Possible Reasons for Event: Unknown  Comments/MD notified:Memon, D/C glipizide at this time, will continue to monitor    Arthur Holms, Gypsy Balsam

## 2015-09-01 NOTE — Progress Notes (Signed)
[facility] PROGRESS NOTE  [name] ZHG:[id number] DOB: [birth year] DOA: 08/31/2015 PCP: Chevis Pretty, FNP  Assessment/Plan: 1. Acute renal failure superimposed on stage IV chronic kidney disease. Continue gentle hydration yet monitor in setting of history of combined systolic and diastolic heart failure. Creatinine improving. Continue current treatments and repeat BMET in the AM. Continue to hold ARB.  2. GI bleeding. Noted to have melena and heme positive stool. Appreciate GI recommendations. No evidence of gross bleeding. Continue to monitor for now.  3. Anemia due to chronic blood loss, followed by outpatient hematology. Hgb improved s/p 2 units PRBC.   4. Weakness, possibly secondary to anemia. Improved after transfusion.  5. Hyperkalemia, likely to resolve with with hydration. ARB discontinued. 6. Hypertension, ARB will be held. 7. Combined systolic and diastolic CHF. Appears compensated. Will monitor in the setting of hydration.  8. DM type 2. Continue glipizide and SSI.  9. Mass oral capacity. Will need outpatient biopsy.  10. Left arm swelling. Follow up imaging.  Code Status: Full DVT prophylaxis: SCDs Family Communication: Family bedside  Disposition Plan: Anticipate discharge in 24 hours.    Consultants:  GI   Procedures:  Transfused 2u PRBC 3/12  Antibiotics:  None   HPI/Subjective: Feels a lot better s/p blood transfusion. Eating without difficulty. Has an outpatient nephrology appt in a few days . Swelling in left arm has been chronic for the last few weeks.   Objective: Filed Vitals:   09/01/15 0025 09/01/15 0613  BP: 112/56 126/65  Pulse: 76 76  Temp: 97.8 F (36.6 C) 98.1 F (36.7 C)  Resp: 16 18    Intake/Output Summary (Last 24 hours) at 09/01/15 0755 Last data filed at 09/01/15 0500  Gross per 24 hour  Intake   1182 ml  Output    850 ml  Net    332 ml   Filed Weights   08/31/15 1315 08/31/15 1742  Weight: 71.215 kg  (157 lb) 68.8 kg (151 lb 10.8 oz)    Exam:  General: NAD, looks comfortable  Cardiovascular: RRR, S1, S2   Respiratory: clear bilaterally, No wheezing, rales or rhonchi  Abdomen: soft, non tender, no distention , bowel sounds normal  Musculoskeletal: left arm edema, right BKA, no LE edema  Data Reviewed: Basic Metabolic Panel:  Recent Labs Lab 08/31/15 1344  NA 131*  K 5.2*  CL 101  CO2 24  GLUCOSE 168*  BUN 66*  CREATININE 4.60*  CALCIUM 8.2*     Recent Labs Lab 08/31/15 1344 09/01/15 0701  WBC 6.6 7.2  NEUTROABS 5.7  --   HGB 8.7* 10.7*  HCT 26.5* 32.2*  MCV 92.3 91.0  PLT 264 254   Cardiac Enzymes:  Recent Labs Lab 08/31/15 1344  TROPONINI <0.03   CBG:  Recent Labs Lab 08/31/15 2004 09/01/15 0738  GLUCAP 150* 58*     Studies: No results found.  Scheduled Meds: . amLODipine  10 mg Oral Daily  . atenolol  100 mg Oral Daily  . cloNIDine  0.2 mg Oral BID  . feeding supplement (ENSURE ENLIVE)  237 mL Oral BID BM  . glipiZIDE  10 mg Oral QAC breakfast   And  . glipiZIDE  5 mg Oral QAC supper  . insulin aspart  0-15 Units Subcutaneous TID WC  . ondansetron  4 mg Oral TID AC & HS  . pantoprazole  40 mg Oral BID AC  . pneumococcal 23 valent vaccine  0.5 mL Intramuscular Tomorrow-1000   Continuous  Infusions: . sodium chloride 75 mL/hr at 09/01/15 0033    Active Problems:   Diabetes mellitus, type 2 (HCC)   GI bleeding   Acute renal failure superimposed on stage 4 chronic kidney disease (HCC)   Weakness   Mass of oral cavity   Left arm swelling   Hyperkalemia    Time spent: 25 minutes     Kathie Dike, MD  [facility] Pager [phone]. If 7PM-7AM, please contact night-coverage at [email or web address], password [facility] 09/01/2015, 7:55 AM      By signing my name below, I, Rennis Harding, attest that this documentation has been prepared under the direction and in the presence of Kathie Dike, MD. Electronically signed:  Rennis Harding, Scribe. 09/01/2015 12:00pm   I, Dr. Kathie Dike, personally performed the services described in this documentaiton. All medical record entries made by the scribe were at my direction and in my presence. I have reviewed the chart and agree that the record reflects my personal performance and is accurate and complete  Kathie Dike, MD, 09/01/2015 12:16 PM

## 2015-09-01 NOTE — Consult Note (Signed)
Referring Provider: Dr. Loma Boston  Primary Care Physician:  [name] Pretty, FNP Primary Gastroenterologist:  Dr. Oneida Alar   Date of Admission: 08/31/15 Date of Consultation: 09/01/15  Reason for Consultation:  Heme positive stool, anemia   HPI:  [name] is a 65 y.o. year old male well known to our practice, patient of Dr. Oneida Alar. 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 Bleeding control therapy performed with APC and clip placement. He has had a capsule study in the past with small bowel and colonic AVMs. He is followed closely by Hematology at [facility]. He was last seen as an outpatient on 08/18/15 by myself, with poor appetite, nausea, early satiety, and weight loss of 20 lbs since Jan 2017. For this reason, he underwent the upper endoscopy on Aug 19, 2015, with bleeding control therapy as described. He had transfusion dependent anemia late Jan with Hgb 6.5 and required 2 units PRBCs and Feraheme IV X 2 doses.  His last colonoscopy was in 2013. Presented this admission with fatigue and Hgb 8, which was down from 10 several weeks ago. He received 2 units of PRBCs with good response.   He feels fatigued, notes decreased urine output. Still notes dysphagia despite empiric dilation recently. Appetite is better after transfusion. States he took 81 mg aspirin several times a few days after the EGD then noticed stool was getting "black". No further overt GI bleeding since admission. Notes left arm is "swollen". Ultrasound of upper extremity has been ordered for today. He has had notable weight loss since Jan this year.    Past Medical History  Diagnosis Date  . Diverticulosis 03/04/10  . Peripheral vascular disease, unspecified ([place])   . Undiagnosed cardiac murmurs   . Other symptoms involving cardiovascular system   . HLD (hyperlipidemia)   . HTN (hypertension)   . Type II or  unspecified type diabetes mellitus without mention of complication, not stated as uncontrolled   . Ulcer   . Carotid artery occlusion   . Umbilical hernia now    has not been repaired  . Blood transfusion   . AVM (arteriovenous malformation) of colon with hemorrhage   . Gastritis   . Internal hemorrhoids   . Right carotid bruit   . Shortness of breath     noted /w low Hgb  . GERD (gastroesophageal reflux disease)   . Chronic kidney disease     renal failure /w osteomyelitis- 2010  . Arthritis     back  . Anemia     8/3,4,5- blood transfusion- [facility], colonoscopy- done & found 2 areas of bleeding   . Tobacco use disorder   . Anxiety   . CHF (congestive heart failure) ([place])     3 yrs ago  . Irregular heart beat   . Iron deficiency anemia due to chronic blood loss 10/07/2010    Past Surgical History  Procedure Laterality Date  . Colonoscopy  03/04/2010    Dr. Princella Pellegrini AMV without mention of ablation, scattered diverticula, internal hemorrhoids  . Bilateral common superficial femoral and profudus artery endarterectomies      2003    Dr. Sherren Mocha Early  . Esophagogastroduodenoscopy  03/2000    Dr. Tharon Aquas gastritis, H.Pylori gastritis, ?treated  . Esophagogastroduodenoscopy  05/2006    Dr. Marcello Fennel recurrent GI bleed. antral gastritis, single gastric AVM s/p APC, CLO results?  . Colonoscopy  11/2004  Dr. Barkley Bruns  . 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 Both of these are ablated with argon plasma coagulator./ Small external hemorrhoids  . Esophagogastroduodenoscopy  01/21/2012    SLF: MILD TO MAODERATE Gastritis/ SLOW GIB LIKEY DUE TO PT BEING ON ASA ND SUBSEQUENT BLOOD/ LOSS FROM AVMS IN STOMACH AND COLON AS WELL AS GASTRITIS  . Enteroscopy  01/21/2012    Procedure: ENTEROSCOPY;  Surgeon: Danie Binder, MD;   [facility];  Service: Endoscopy;;  . No past surgeries    . Vascular surgery      rt bka and rt 1st toe amputation  . Below knee leg amputation      2010, wears prosthesis   . Endarterectomy  02/09/2012    Procedure: ENDARTERECTOMY CAROTID;  Surgeon: Rosetta Posner, MD;  Location: [facility];  Service: Vascular;  Laterality: Left;  . Carotid endarterectomy Left 02-09-12  . Esophagogastroduodenoscopy N/A 09/03/2013    Mild chronic gastritis. benign gastric polyps  . [place]ivens capsule study N/A 09/03/2013    Dr. Oneida Alar: small bowel and colonic AVMs. no active bleeding  . Esophagogastroduodenoscopy N/A 08/19/2015    Dr. Oneida Alar: 2 large gastric AVMs in fundus, 1 actively bleeding s/p APC and clip placement, empiric Savary dilation  . Savory dilation N/A 08/19/2015    Procedure: SAVORY DILATION;  Surgeon: Danie Binder, MD;  [facility];  Service: Endoscopy;  Laterality: N/A;    Prior to Admission medications   Medication Sig Start Date End Date Taking? Authorizing Provider  amLODipine (NORVASC) 10 MG tablet TAKE 1 TABLET BY MOUTH DAILY, BEFORE BREAKFAST 05/06/15  Yes Sharion Balloon, FNP  atenolol (TENORMIN) 100 MG tablet TAKE 1 TABLET (100 MG TOTAL) BY MOUTH DAILY. 03/23/15  Yes Sharion Balloon, FNP  Calcium Carbonate Antacid (TUMS ULTRA 1000 PO) Take 2 tablets by mouth daily.   Yes Historical Provider, MD  cloNIDine (CATAPRES) 0.2 MG tablet Take 1 tablet (0.2 mg total) by mouth 2 (two) times daily. 07/11/15  Yes [name] Done, FNP  furosemide (LASIX) 40 MG tablet TAKE 1 TABLET (40 MG TOTAL) BY MOUTH AS NEEDED. Patient taking differently: TAKE 1 TABLET (40 MG TOTAL) BY MOUTH AS NEEDED FLUID 03/20/14  Yes [name] Done, FNP  gabapentin (NEURONTIN) 300 MG capsule Take 1 capsule (300 mg total) by mouth 3 (three) times daily. Patient taking differently: Take 300 mg by mouth daily as needed (nerve pain).  03/22/14  Yes Sharion Balloon, FNP  glipiZIDE (GLUCOTROL) 10 MG tablet  Take 1.5 tablets (15 mg total) by mouth daily. Takes one tablet in the Am and 1/2 tablet in the pm 06/19/15  Yes Sharion Balloon, FNP  HYDROcodone-acetaminophen (NORCO/VICODIN) 5-325 MG tablet Take 1 tablet by mouth every 8 (eight) hours as needed. Patient taking differently: Take 1 tablet by mouth every 8 (eight) hours as needed for moderate pain.  05/06/15  Yes Sharion Balloon, FNP  ondansetron (ZOFRAN) 4 MG tablet Take 1 tablet (4 mg total) by mouth 4 (four) times daily -  before meals and at bedtime. 08/15/15  Yes Orvil Feil, NP  pantoprazole (PROTONIX) 40 MG tablet TAKE 1 TABLET BY MOUTH TWICE A DAY BEFORE MEAL 06/19/15  Yes Sharion Balloon, FNP  polyethylene glycol powder (GLYCOLAX/MIRALAX) powder Take 1 Container by mouth once. Only as needed  Yes Historical Provider, MD  silver sulfADIAZINE (SILVADENE) 1 % cream APPLY TO AFFECTED AREA TWICE A DAY 03/14/14  Yes [name] Done, FNP  valsartan (DIOVAN) 320 MG tablet Take 1 tablet (320 mg total) by mouth daily. 05/06/15  Yes Sharion Balloon, FNP  Vitamin D, Ergocalciferol, (DRISDOL) 50000 units CAPS capsule Take 50,000 Units by mouth once a week. Takes on Saturdays. 08/10/15  Yes Historical Provider, MD    Current Facility-Administered Medications  Medication Dose Route Frequency Provider Last Rate Last Dose  . 0.9 %  sodium chloride infusion   Intravenous Continuous Truett Mainland, DO 75 mL/hr at 09/01/15 9562    . acetaminophen (TYLENOL) tablet 650 mg  650 mg Oral Q6H PRN Truett Mainland, DO       Or  . acetaminophen (TYLENOL) suppository 650 mg  650 mg Rectal Q6H PRN Tanna Savoy Stinson, DO      . alum & mag hydroxide-simeth (MAALOX/MYLANTA) 200-200-20 MG/5ML suspension 30 mL  30 mL Oral Q6H PRN Tanna Savoy Stinson, DO      . amLODipine (NORVASC) tablet 10 mg  10 mg Oral Daily Tanna Savoy Stinson, DO      . atenolol (TENORMIN) tablet 100 mg  100 mg Oral Daily Tanna Savoy Stinson, DO      . bisacodyl (DULCOLAX) suppository 10 mg  10 mg Rectal Daily  PRN Truett Mainland, DO      . cloNIDine (CATAPRES) tablet 0.2 mg  0.2 mg Oral BID Tanna Savoy Stinson, DO   0.2 mg at 08/31/15 2248  . feeding supplement (ENSURE ENLIVE) (ENSURE ENLIVE) liquid 237 mL  237 mL Oral BID BM Tanna Savoy Stinson, DO      . gabapentin (NEURONTIN) capsule 300 mg  300 mg Oral Daily PRN Tanna Savoy Stinson, DO      . glipiZIDE (GLUCOTROL) tablet 10 mg  10 mg Oral QAC breakfast Tanna Savoy Stinson, DO   10 mg at 09/01/15 0831   And  . glipiZIDE (GLUCOTROL) tablet 5 mg  5 mg Oral QAC supper Truett Mainland, DO   5 mg at 08/31/15 1857  . HYDROcodone-acetaminophen (NORCO/VICODIN) 5-325 MG per tablet 1 tablet  1 tablet Oral Q8H PRN Tanna Savoy Stinson, DO      . insulin aspart (novoLOG) injection 0-15 Units  0-15 Units Subcutaneous TID WC Tanna Savoy Stinson, DO   0 Units at 09/01/15 0757  . ondansetron (ZOFRAN) tablet 4 mg  4 mg Oral TID AC & HS Tanna Savoy Stinson, DO   4 mg at 08/31/15 2248  . pantoprazole (PROTONIX) EC tablet 40 mg  40 mg Oral BID AC Tanna Savoy Stinson, DO   40 mg at 09/01/15 0831  . pneumococcal 23 valent vaccine (PNU-IMMUNE) injection 0.5 mL  0.5 mL Intramuscular Tomorrow-1000 Tanna Savoy Stinson, DO      . polyethylene glycol (MIRALAX / GLYCOLAX) packet 17 g  17 g Oral Daily PRN Tanna Savoy Stinson, DO      . polyethylene glycol (MIRALAX / GLYCOLAX) packet 17 g  17 g Oral Daily Kathie Dike, MD        Allergies as of 08/31/2015 - Review Complete 08/31/2015  Allergen Reaction Noted  . Zocor [simvastatin] Palpitations 03/07/2013  . Asa [aspirin] Other (See Comments) 03/22/2014  . Hctz [hydrochlorothiazide]  12/06/2012  . Iodine Hives 10/07/2010  . Sulfa antibiotics Other (See Comments) 10/07/2010    Family History  Problem Relation Age of Onset  . Colon cancer Brother 18  deceased  . Hyperlipidemia Brother   . Hypertension Brother   . Lung cancer Sister     deceased, three primary cancers: lung, esophageal, lymphoma.   . Diabetes Sister   . Hypertension Sister   . Cancer  Father     gallbladder  . Hyperlipidemia Father   . Hypertension Father   . Cancer Brother     unknown primary  . Cancer Sister     unknown primary  . Hyperlipidemia Sister   . Diabetes Mother   . Hypertension Mother     Social History   Social History  . Marital Status: Married    Spouse Name: N/A  . Number of Children: N/A  . Years of Education: N/A   Occupational History  . Not on file.   Social History Main Topics  . Smoking status: Current Every Day Smoker -- 2.00 packs/day for 50 years    Types: Cigarettes  . Smokeless tobacco: Former Systems developer    Quit date: 02/08/2012     Comment: Smokes about 2 packs of tiny skinny cigarettes  . Alcohol Use: No  . Drug Use: No  . Sexual Activity: Not on file   Other Topics Concern  . Not on file   Social History Narrative    Review of Systems: Gen: see HPI  CV: Denies chest pain, heart palpitations, syncope, edema  Resp: Denies shortness of breath with rest, cough, wheezing GI: see HPI  GU : decreased urine output  MS: Denies joint pain,swelling, cramping Derm: Denies rash, itching, dry skin Psych: Denies depression, anxiety,confusion, or memory loss Heme: see HPI   Physical Exam: Vital signs in last 24 hours: Temp:  [97.6 F (36.4 C)-98.4 F (36.9 C)] 98.1 F (36.7 C) (03/13 0867) Pulse Rate:  [74-82] 76 (03/13 0613) Resp:  [8-20] 18 (03/13 0613) BP: (103-140)/(43-65) 126/65 mmHg (03/13 0613) SpO2:  [90 %-99 %] 93 % (03/13 0613) Weight:  [151 lb 10.8 oz (68.8 kg)-157 lb (71.215 kg)] 151 lb 10.8 oz (68.8 kg) (03/12 1742) Last BM Date: 08/29/15 (per pt and family) General:   Alert, appears older than stated age Head:  Normocephalic and atraumatic. Eyes:  Sclera clear, no icterus.   Conjunctiva pink. Ears:  Normal auditory acuity. Lungs:  Clear throughout to auscultation.   Heart:  2/6 systolic murmur  Abdomen:  Soft, nontender and nondistended. Umbilical hernia noted Rectal:  Deferred until time of colonoscopy.    Msk:  Very mild lower left arm edema, slightly greater than right  Extremities:  Right BKA with prosthesis  Neurologic:  Alert and  oriented x4 Psych:  Alert and cooperative. Normal mood and affect.  Intake/Output from previous day: 03/12 0701 - 03/13 0700 In: 1182 [I.V.:500; Blood:682] Out: 850 [Urine:850] Intake/Output this shift:    Lab Results:  Recent Labs  08/31/15 1344 09/01/15 0701  WBC 6.6 7.2  HGB 8.7* 10.7*  HCT 26.5* 32.2*  PLT 264 254   BMET  Recent Labs  08/31/15 1344 09/01/15 0701  NA 131* 135  K 5.2* 4.8  CL 101 103  CO2 24 25  GLUCOSE 168* 68  BUN 66* 63*  CREATININE 4.60* 4.46*  CALCIUM 8.2* 8.5*   Impression: 65 year old male with history of IDA secondary to known AVMs, with recent EGD August 19, 2015 with gastric AVMs s/p APC therapy and clip placement. Endorses several doses of 81 mg aspirin after endoscopy, which could have precipitated occult bleeding. He has had no further overt GI bleeding, and his Hgb improved  appropriately after 2 units PRBCs. Would hold on procedures currently unless further evidence of overt bleeding and/or worsening anemia. Colonoscopy last performed in 2013; may need to revisit this as outpatient if persistent issues. For now, supportive measures recommended due to known history of AVMs and strict avoidance of aspirin, NSAIDs, etc.    As of note, he also has a constellation of worrisome signs/symptoms to include weight loss, lack of appetite, nausea. This is likely multifactorial in the setting of worsening renal disease, possible underlying gastroparesis, and possibility of chronic mesenteric ischemia in the setting of significant known vascular disease. He is not a candidate for a CT angiogram or MR angiogram to assess mesenteric vasculature due to renal disease. As of today, he states his appetite is better since receiving blood. Dysphagia persists, which may be addressed with BPE in a more elective setting.   Left arm  edema: ultrasound ordered for today   Plan: Follow H/H, monitor for overt GI bleeding Avoid aspirin indefinitely Zofran scheduled with meals and bedtime Protonix BID Ultrasound of upper extremity ordered per admitting physician Hold on endoscopic evaluation unless further evidence of overt GI bleeding Consider outpatient colonoscopy  Consider renal consultation  Orvil Feil, ANP-BC [facility] Gastroenterology        09/01/2015, 9:20 AM   GI attending note: Patient interviewed and examined. Records reviewed including recent EGD of 08/19/2015. Recurrent GI bleed suspected to be due to GI angiodysplasia in the setting of aspirin use. Patient has received 2 units of PRBCs and his hemoglobin is up from 8.7-10.7 g. He just passed formed brown stool. Therefore it appears she has stopped bleeding. Will check CBC and metabolic 7 in a.m. Unless he has has evidence of recurrent GI bleed he may be able to go home tomorrow.

## 2015-09-01 NOTE — Care Management Obs Status (Signed)
[facility] NOTIFICATION   Patient Details  Name: [name] MRN: [id number] Date of Birth: [birth year]   Medicare Observation Status Notification Given:       Alvie Heidelberg, RN 09/01/2015, 10:01 AM

## 2015-09-01 NOTE — Progress Notes (Signed)
Inpatient Diabetes Program Recommendations  AACE/ADA: New Consensus Statement on Inpatient Glycemic Control (2015)  Target Ranges:  Prepandial:   less than 140 mg/dL      Peak postprandial:   less than 180 mg/dL (1-2 hours)      Critically ill patients:  140 - 180 mg/dL   Results for ZALMEN, WRIGHTSMAN (MRN [id number]) as of 09/01/2015 10:58  Ref. Range 09/01/2015 07:38 09/01/2015 08:30  Glucose-Capillary Latest Ref Range: 65-99 mg/dL 58 (L) 121 (H)    Admit with: Acute Renal Failure/ Anemia  History: DM, CHF  Home DM Meds: Glipizide 10 mg AM/ 5 mg PM  Current Insulin Orders: Glipizide 10 mg AM/ 5 mg PM      Novolog Moderate Correction Scale/ SSI (0-15 units) TID AC     MD- Patient with Hypoglycemia this AM.  Please discontinue Glipizide for now.    Would continue Novolog Moderate Correction Scale/ SSI (0-15 units) TID AC     --Will follow patient during hospitalization--  Wyn Quaker RN, MSN, CDE Diabetes Coordinator Inpatient Glycemic Control Team Team Pager: [phone] (8a-5p)

## 2015-09-01 NOTE — Care Management Obs Status (Signed)
[facility] NOTIFICATION   Patient Details  Name: [name] MRN: [id number] Date of Birth: 1950/11/27   Medicare Observation Status Notification Given:  Yes    Alvie Heidelberg, RN 09/01/2015, 10:02 AM

## 2015-09-01 NOTE — Progress Notes (Signed)
Initial Nutrition Assessment  DOCUMENTATION CODES:  MALNUTRITION (severe) in the context of chronic illness   INTERVENTION:  Ensure Enlive po BID, each supplement provides 350 kcal and 20 grams of protein   Recommend consider check vitamin D status   Supplement water-soluble vitaimins  NUTRITION DIAGNOSIS:    MALNUTRITION (severe) related to poor appetite (enrgy intake </=75% fir >/= 1 month) as evidenced by pt/family report and severe unplanned weight loss of 9 kg (11%) < 90 days.  GOAL:  Pt to meet >/= 90% of their estimated nutrition needs      MONITOR: Supplement acceptance, meal intake, labs and wt trends     REASON FOR ASSESSMENT:   Malnutrition Screening Tool    ASSESSMENT:  Pt has history of hypertension, type 2 diabetes, AVM of colon, coronary artery disease, systolic heart failure with EF of 45% on 08/29/15 with grade 1 diastolic dysfunction, recent upper endoscopy with dilation on 08/19/15, GERD, tobacco abuse. Patient has been having fatigue over the past 2-3 weeks, which has been increasing especially over the past several days. Activity makes his fatigue worse and rest makes his fatigue better. The patient is being followed by hematology/oncology for his iron deficiency anemia due to chronic blood loss. A hemoglobin was checked on Friday, which was found to be 8. Pt also has hx of constipation. No BM since 3/10. He also smokes 100 packs per year.  Talked with pt and spouse. He is up in chair and lunch has just arrived. He is c/o of not wanting anything to eat. He feeds himself. Pt has an Ensure here which he says he is waiting until it gets warmer (more room temperature). Pt says he just has no appetite.   He has severe unplanned weight loss since January 2017.   Labs: BUN 63, Cr 4.46, H/H-10.7/32.2.  CBG's today-58, 121, 70.    Diet Order:  Diet regular Room service appropriate?: Yes; Fluid consistency:: Thin  Skin:   dry, flaky with cracking to left heel.  Blister to right residual limb. Edema to left arm.   Last BM:   08/29/15   Height:   Ht Readings from Last 1 Encounters:  08/31/15 '5\' 4"'$  (1.626 m)    Weight:   Wt Readings from Last 1 Encounters:  08/31/15 151 lb 10.8 oz (68.8 kg)    Ideal Body Weight:  55.4 kg (adjusted for left BKA)  BMI:  Body mass index is 26.02 kg/(m^2).  Estimated Nutritional Needs:   Kcal:  3810-1751   Protein:  55 gr  Fluid:  1.8-2.0 liters daily  EDUCATION NEEDS:     Colman Cater MS,RD,CSG,LDN Office: [phone] Pager: [phone]

## 2015-09-02 DIAGNOSIS — K921 Melena: Secondary | ICD-10-CM | POA: Diagnosis not present

## 2015-09-02 DIAGNOSIS — D649 Anemia, unspecified: Secondary | ICD-10-CM

## 2015-09-02 DIAGNOSIS — R531 Weakness: Secondary | ICD-10-CM | POA: Diagnosis not present

## 2015-09-02 DIAGNOSIS — D500 Iron deficiency anemia secondary to blood loss (chronic): Secondary | ICD-10-CM

## 2015-09-02 DIAGNOSIS — E1142 Type 2 diabetes mellitus with diabetic polyneuropathy: Secondary | ICD-10-CM | POA: Diagnosis not present

## 2015-09-02 DIAGNOSIS — E875 Hyperkalemia: Secondary | ICD-10-CM | POA: Diagnosis not present

## 2015-09-02 DIAGNOSIS — N179 Acute kidney failure, unspecified: Secondary | ICD-10-CM | POA: Diagnosis not present

## 2015-09-02 LAB — CBC
HEMATOCRIT: 32.3 % — AB (ref 39.0–52.0)
Hemoglobin: 10.6 g/dL — ABNORMAL LOW (ref 13.0–17.0)
MCH: 30.2 pg (ref 26.0–34.0)
MCHC: 32.8 g/dL (ref 30.0–36.0)
MCV: 92 fL (ref 78.0–100.0)
PLATELETS: 255 10*3/uL (ref 150–400)
RBC: 3.51 MIL/uL — ABNORMAL LOW (ref 4.22–5.81)
RDW: 16.5 % — AB (ref 11.5–15.5)
WBC: 6.5 10*3/uL (ref 4.0–10.5)

## 2015-09-02 LAB — GLUCOSE, CAPILLARY
GLUCOSE-CAPILLARY: 92 mg/dL (ref 65–99)
Glucose-Capillary: 121 mg/dL — ABNORMAL HIGH (ref 65–99)

## 2015-09-02 LAB — BASIC METABOLIC PANEL
Anion gap: 8 (ref 5–15)
BUN: 61 mg/dL — ABNORMAL HIGH (ref 6–20)
CALCIUM: 8.2 mg/dL — AB (ref 8.9–10.3)
CO2: 24 mmol/L (ref 22–32)
CREATININE: 4.38 mg/dL — AB (ref 0.61–1.24)
Chloride: 106 mmol/L (ref 101–111)
GFR, EST AFRICAN AMERICAN: 15 mL/min — AB (ref >60)
GFR, EST NON AFRICAN AMERICAN: 13 mL/min — AB (ref >60)
Glucose, Bld: 74 mg/dL (ref 65–99)
Potassium: 5 mmol/L (ref 3.5–5.1)
SODIUM: 138 mmol/L (ref 135–145)

## 2015-09-02 MED ORDER — GLIPIZIDE 10 MG PO TABS: 5.0000 mg | ORAL_TABLET | Freq: Every day | ORAL | Status: DC

## 2015-09-02 NOTE — Progress Notes (Signed)
IV accidentally came out of pt arm.  Stopped IV fluids at this time.  Dr Roderic Palau made aware, will leave out at this time.

## 2015-09-02 NOTE — Progress Notes (Signed)
REVIEWED. AVOID ASA. OPV in May 2017.   Subjective: No overt GI bleeding. BM brown. No abdominal pain. Tolerating diet.   Objective: Vital signs in last 24 hours: Temp:  [98.2 F (36.8 C)] 98.2 F (36.8 C) (03/14 0531) Pulse Rate:  [68-76] 76 (03/14 0531) Resp:  [16-18] 16 (03/14 0531) BP: (122-133)/(56-61) 133/60 mmHg (03/14 0531) SpO2:  [93 %-96 %] 96 % (03/14 0531) Last BM Date: 09/01/15 General:   Alert and oriented, pleasant Head:  Normocephalic and atraumatic. Abdomen:  Sitting up in chair eating breakfast, limited exam. Bowel sounds present, soft, non-tender, non-distended. Umbilical hernia present.  Extremities:  Right bka with prosthesis  Neurologic:  Alert and  oriented x4   Intake/Output from previous day: 03/13 0701 - 03/14 0700 In: 780 [P.O.:780] Out: 900 [Urine:900] Intake/Output this shift:    Lab Results:  Recent Labs  08/31/15 1344 09/01/15 0701 09/02/15 0423  WBC 6.6 7.2 6.5  HGB 8.7* 10.7* 10.6*  HCT 26.5* 32.2* 32.3*  PLT 264 254 255   BMET  Recent Labs  08/31/15 1344 09/01/15 0701 09/02/15 0423  NA 131* 135 138  K 5.2* 4.8 5.0  CL 101 103 106  CO2 '24 25 24  '$ GLUCOSE 168* 68 74  BUN 66* 63* 61*  CREATININE 4.60* 4.46* 4.38*  CALCIUM 8.2* 8.5* 8.2*     Studies/Results: US Venous Img Upper Uni Left  09/01/2015  CLINICAL DATA:  Left upper extremity swelling for 4-6 weeks. EXAM: LEFT UPPER EXTREMITY VENOUS DOPPLER ULTRASOUND TECHNIQUE: Gray-scale sonography with graded compression, as well as color Doppler and duplex ultrasound were performed to evaluate the upper extremity deep venous system from the level of the subclavian vein and including the jugular, axillary, basilic and upper cephalic vein. Spectral Doppler was utilized to evaluate flow at rest and with distal augmentation maneuvers. COMPARISON:  None. FINDINGS: Thrombus within deep veins:  None visualized. Normal compressibility, phasicity and color Doppler flow in the left  internal jugular vein. Normal compressibility, Doppler flow and augmentation in the left subclavian vein. Normal compressibility, color Doppler flow and augmentation in left axillary vein. Normal compressibility, color Doppler flow and augmentation in the left brachial veins. The left radial and ulnar veins are patent. Normal compressibility, color Doppler flow and augmentation in left cephalic vein. Normal compressibility, color Doppler flow and augmentation in the left basilic vein. Other findings: Normal compressibility and color Doppler flow in the right subclavian vein. IMPRESSION: Negative for deep venous thrombosis in the left upper extremity. Electronically Signed   By: Markus Daft M.D.   On: 09/01/2015 14:52    Assessment: 65 year old male with history of IDA secondary to known AVMs, with recent EGD August 19, 2015 with gastric AVMs s/p APC therapy and clip placement. Endorses several doses of 81 mg aspirin after endoscopy, which likely precipitated occult bleeding. He has had no further overt GI bleeding, and his Hgb improved appropriately after 2 units PRBCs. Stable from a GI standpoint without need for intervention.   As of note, he also has a constellation of worrisome signs/symptoms to include weight loss, lack of appetite, nausea. This is likely multifactorial in the setting of worsening renal disease, possible underlying gastroparesis, and unable to exclude the  possibility of chronic mesenteric ischemia in the setting of significant known vascular disease. He is not a candidate for a CT angiogram or MR angiogram to assess mesenteric vasculature due to renal disease. He is tolerating his diet. Vague dysphagia noted on admission; BPE could be pursued as  outpatient in a more elective setting.   Left arm edema: negative for DVT. Per attending.   Plan: Avoid aspirin indefinitely Continue PPI BID Zofran scheduled with meals Will follow as outpatient to address other non-urgent GI  symptoms   Orvil Feil, ANP-BC [facility] Gastroenterology       09/02/2015, 8:28 AM

## 2015-09-02 NOTE — Care Management Note (Signed)
Case Management Note  Patient Details  Name: [name] MRN: [id number] Date of Birth: 22-Aug-1950  Subjective/Objective:   Spoke with patient and spouse. Patient was up ambulating in room with RT.  O2 Saturations were 95% with exertion. Patient has DME at home but does not use. Patient still drives and is independent.     No CM needs anticipated.         Action/Plan:  Home with self care.   Expected Discharge Date:  09/02/15               Expected Discharge Plan:  Home/Self Care  In-House Referral:     Discharge planning Services  CM Consult  Post Acute Care Choice:    Choice offered to:     DME Arranged:    DME Agency:     HH Arranged:    HH Agency:     Status of Service:  Completed, signed off  Medicare Important Message Given:    Date Medicare IM Given:    Medicare IM give by:    Date Additional Medicare IM Given:    Additional Medicare Important Message give by:     If discussed at [place] of Stay Meetings, dates discussed:    Additional Comments:  Alvie Heidelberg, RN 09/02/2015, 11:55 AM

## 2015-09-02 NOTE — Progress Notes (Signed)
Reviewed discharge intsructions with pt and wife, answered questions at this time.  Pt transported to lobby via wheelchair.

## 2015-09-02 NOTE — Discharge Summary (Signed)
Physician Discharge Summary  [name] IEP:[id number] DOB: 17-Oct-1950 DOA: 08/31/2015  PCP: Chevis Pretty, FNP  Admit date: 08/31/2015 Discharge date: 09/02/2015  Time spent: 35 minutes  Recommendations for Outpatient Follow-up:  1. Follow up with PCP in 1-2 weeks. 2. Follow up with hematology/oncology for anemia and evaluation of oral cavity mass 3. Follow up with GI to consider colonoscopy   Discharge Diagnoses:  Active Problems:   Diabetes mellitus, type 2 (HCC)   GI bleeding   Acute renal failure superimposed on stage 4 chronic kidney disease (HCC)   Weakness   Mass of oral cavity   Left arm swelling   Hyperkalemia   Absolute anemia   Discharge Condition: improved  Diet recommendation: carb modified, heart healthy  Filed Weights   08/31/15 1315 08/31/15 1742  Weight: 71.215 kg (157 lb) 68.8 kg (151 lb 10.8 oz)    History of present illness:  35 yom with a hx of DM type 2, CAD, systolic heart failure, GERD, tobacco abuse, and HTN, presented with complaints of fatigue onset 2-3 weeks prior to admission with increasing severity. Pt reported that fatigue worsened after activity, and was improved by rest. Pt is followed by hematology/oncology for his iron deficiency anemia due to chronic blood loss. Hgb was recently checked and found to be 8. He was scheduled to receive 2U pRBC 3/13. ED workup revealed creatinine 4.6 and hgb 8.7. He was admitted for further management.  Hospital Course:  Pt was found to have acute renal failure superimposed on stage IV chronic kidney disease. ARB was held and he was started on gentle hydration and monitored in the setting of a history of combined systolic and diastolic heart failure. His creatinine had modest improvement with IV fluids. Case discussed with Dr. Lowanda Foster who is familiar with patient and patient will be followed up in the office in the next 2 weeks.   1. GI bleeding. Noted to have melena and heme positive stool.  Followed by GI in the hospital. No evidence of gross bleeding. Continue to monitor for now. Continue on PPI and avoid aspirin 2. Anemia due to chronic blood loss, followed by outpatient hematology. Hgb improved s/p 2 units PRBC and has remained stable..  3. Weakness, possibly secondary to anemia. Improved after transfusion.  4. Hyperkalemia, improved with hydration and discontinuation of ARB 5. Hypertension, ARB was held, but other antihypertensives were continued. Blood pressures have remained stable 6. Chronic Combined systolic and diastolic CHF. Appears compensated. Will monitor in the setting of hydration.  7. DM type 2. Continue glipizide and SSI.  8. Mass oral capacity. Will need outpatient biopsy.  9. Left arm swelling. No DVT on venous dopplers  Procedures:  2U pRBC transfusion 3/12  Consultations:  GI  Discharge Exam: Filed Vitals:   09/02/15 1017 09/02/15 1306  BP: 148/64   Pulse: 80 75  Temp:    Resp:  16    General: NAD, looks comfortable  Cardiovascular: RRR, S1, S2   Respiratory: clear bilaterally, No wheezing, rales or rhonchi  Abdomen: soft, non tender, no distention , bowel sounds normal  Musculoskeletal: No edema b/l  Discharge Instructions   Discharge Instructions    Diet - low sodium heart healthy    Complete by:  As directed      Increase activity slowly    Complete by:  As directed           Current Discharge Medication List    CONTINUE these medications which have CHANGED  Details  glipiZIDE (GLUCOTROL) 10 MG tablet Take 0.5 tablets (5 mg total) by mouth daily before breakfast. Takes one tablet in the Am and 1/2 tablet in the pm Qty: 135 tablet, Refills: 0      CONTINUE these medications which have NOT CHANGED   Details  amLODipine (NORVASC) 10 MG tablet TAKE 1 TABLET BY MOUTH DAILY, BEFORE BREAKFAST Qty: 90 tablet, Refills: 1   Associated Diagnoses: Essential hypertension    atenolol (TENORMIN) 100 MG tablet TAKE 1 TABLET  (100 MG TOTAL) BY MOUTH DAILY. Qty: 90 tablet, Refills: 1    Calcium Carbonate Antacid (TUMS ULTRA 1000 PO) Take 2 tablets by mouth daily.    cloNIDine (CATAPRES) 0.2 MG tablet Take 1 tablet (0.2 mg total) by mouth 2 (two) times daily. Qty: 60 tablet, Refills: 3   Associated Diagnoses: Essential hypertension    furosemide (LASIX) 40 MG tablet TAKE 1 TABLET (40 MG TOTAL) BY MOUTH AS NEEDED. Qty: 30 tablet, Refills: 5    gabapentin (NEURONTIN) 300 MG capsule Take 1 capsule (300 mg total) by mouth 3 (three) times daily. Qty: 270 capsule, Refills: 3   Associated Diagnoses: Peripheral vascular disease ([place]); Diabetic peripheral neuropathy (HCC)    HYDROcodone-acetaminophen (NORCO/VICODIN) 5-325 MG tablet Take 1 tablet by mouth every 8 (eight) hours as needed. Qty: 30 tablet, Refills: 0    ondansetron (ZOFRAN) 4 MG tablet Take 1 tablet (4 mg total) by mouth 4 (four) times daily -  before meals and at bedtime. Qty: 120 tablet, Refills: 1    pantoprazole (PROTONIX) 40 MG tablet TAKE 1 TABLET BY MOUTH TWICE A DAY BEFORE MEAL Qty: 180 tablet, Refills: 1    polyethylene glycol powder (GLYCOLAX/MIRALAX) powder Take 1 Container by mouth once. Only as needed    silver sulfADIAZINE (SILVADENE) 1 % cream APPLY TO AFFECTED AREA TWICE A DAY Qty: 240 g, Refills: 1    Vitamin D, Ergocalciferol, (DRISDOL) 50000 units CAPS capsule Take 50,000 Units by mouth once a week. Takes on Saturdays. Refills: 0      STOP taking these medications     valsartan (DIOVAN) 320 MG tablet        Allergies  Allergen Reactions  . Zocor [Simvastatin] Palpitations    Chest soreness  . Asa [Aspirin] Other (See Comments)    GI Bleed  . Hctz [Hydrochlorothiazide]     Leg cramps  . Iodine Hives    IVP dye  . Sulfa Antibiotics Other (See Comments)    Unknown       The results of significant diagnostics from this hospitalization (including imaging, microbiology, ancillary and laboratory) are listed below for  reference.    Significant Diagnostic Studies: US Venous Img Upper Uni Left  09/01/2015  CLINICAL DATA:  Left upper extremity swelling for 4-6 weeks. EXAM: LEFT UPPER EXTREMITY VENOUS DOPPLER ULTRASOUND TECHNIQUE: Gray-scale sonography with graded compression, as well as color Doppler and duplex ultrasound were performed to evaluate the upper extremity deep venous system from the level of the subclavian vein and including the jugular, axillary, basilic and upper cephalic vein. Spectral Doppler was utilized to evaluate flow at rest and with distal augmentation maneuvers. COMPARISON:  None. FINDINGS: Thrombus within deep veins:  None visualized. Normal compressibility, phasicity and color Doppler flow in the left internal jugular vein. Normal compressibility, Doppler flow and augmentation in the left subclavian vein. Normal compressibility, color Doppler flow and augmentation in left axillary vein. Normal compressibility, color Doppler flow and augmentation in the left brachial veins. The left  radial and ulnar veins are patent. Normal compressibility, color Doppler flow and augmentation in left cephalic vein. Normal compressibility, color Doppler flow and augmentation in the left basilic vein. Other findings: Normal compressibility and color Doppler flow in the right subclavian vein. IMPRESSION: Negative for deep venous thrombosis in the left upper extremity. Electronically Signed   By: Markus Daft M.D.   On: 09/01/2015 14:52    Microbiology: No results found for this or any previous visit (from the past 240 hour(s)).   Labs: Basic Metabolic Panel:  Recent Labs Lab 08/31/15 1344 09/01/15 0701 09/02/15 0423  NA 131* 135 138  K 5.2* 4.8 5.0  CL 101 103 106  CO2 '24 25 24  '$ GLUCOSE 168* 68 74  BUN 66* 63* 61*  CREATININE 4.60* 4.46* 4.38*  CALCIUM 8.2* 8.5* 8.2*   Liver Function Tests: No results for input(s): AST, ALT, ALKPHOS, BILITOT, PROT, ALBUMIN in the last 168 hours. No results for  input(s): LIPASE, AMYLASE in the last 168 hours. No results for input(s): AMMONIA in the last 168 hours. CBC:  Recent Labs Lab 08/31/15 1344 09/01/15 0701 09/02/15 0423  WBC 6.6 7.2 6.5  NEUTROABS 5.7  --   --   HGB 8.7* 10.7* 10.6*  HCT 26.5* 32.2* 32.3*  MCV 92.3 91.0 92.0  PLT 264 254 255   Cardiac Enzymes:  Recent Labs Lab 08/31/15 1344  TROPONINI <0.03   BNP: BNP (last 3 results) No results for input(s): BNP in the last 8760 hours.  ProBNP (last 3 results) No results for input(s): PROBNP in the last 8760 hours.  CBG:  Recent Labs Lab 09/01/15 2102 09/01/15 2129 09/01/15 2209 09/02/15 0708 09/02/15 1123  GLUCAP 60* 58* 98 92 121*       Signed:  Kathie Dike, MD. [facility] 09/02/2015, 3:23 PM  By signing my name below, I, Delene Ruffini, attest that this documentation has been prepared under the direction and in the presence of Kathie Dike, MD. Electronically Signed: Delene Ruffini 09/02/2015   I, Dr. Kathie Dike, personally performed the services described in this documentaiton. All medical record entries made by the scribe were at my direction and in my presence. I have reviewed the chart and agree that the record reflects my personal performance and is accurate and complete  Kathie Dike, MD, 09/02/2015 3:23 PM

## 2015-09-03 ENCOUNTER — Encounter (HOSPITAL_COMMUNITY): Payer: Medicare Other

## 2015-09-03 ENCOUNTER — Encounter (HOSPITAL_COMMUNITY): Payer: Medicare Other | Attending: Hematology & Oncology | Admitting: Hematology & Oncology

## 2015-09-03 ENCOUNTER — Encounter (HOSPITAL_COMMUNITY): Payer: Self-pay | Admitting: Hematology & Oncology

## 2015-09-03 ENCOUNTER — Other Ambulatory Visit (HOSPITAL_COMMUNITY)
Admission: RE | Admit: 2015-09-03 | Discharge: 2015-09-03 | Disposition: A | Discharge status: Home / Self Care | Payer: Medicare Other | Source: Other Acute Inpatient Hospital | Attending: Nephrology | Admitting: Nephrology

## 2015-09-03 VITALS — BP 128/62 | HR 77 | Temp 97.6°F | Resp 18 | Wt 161.0 lb

## 2015-09-03 DIAGNOSIS — I739 Peripheral vascular disease, unspecified: Secondary | ICD-10-CM | POA: Diagnosis not present

## 2015-09-03 DIAGNOSIS — E1129 Type 2 diabetes mellitus with other diabetic kidney complication: Secondary | ICD-10-CM | POA: Insufficient documentation

## 2015-09-03 DIAGNOSIS — D500 Iron deficiency anemia secondary to blood loss (chronic): Secondary | ICD-10-CM | POA: Diagnosis not present

## 2015-09-03 DIAGNOSIS — K219 Gastro-esophageal reflux disease without esophagitis: Secondary | ICD-10-CM | POA: Insufficient documentation

## 2015-09-03 DIAGNOSIS — F419 Anxiety disorder, unspecified: Secondary | ICD-10-CM | POA: Diagnosis not present

## 2015-09-03 DIAGNOSIS — E1122 Type 2 diabetes mellitus with diabetic chronic kidney disease: Secondary | ICD-10-CM | POA: Insufficient documentation

## 2015-09-03 DIAGNOSIS — K2970 Gastritis, unspecified, without bleeding: Secondary | ICD-10-CM | POA: Insufficient documentation

## 2015-09-03 DIAGNOSIS — E875 Hyperkalemia: Secondary | ICD-10-CM | POA: Diagnosis not present

## 2015-09-03 DIAGNOSIS — M1990 Unspecified osteoarthritis, unspecified site: Secondary | ICD-10-CM | POA: Diagnosis not present

## 2015-09-03 DIAGNOSIS — E559 Vitamin D deficiency, unspecified: Secondary | ICD-10-CM | POA: Insufficient documentation

## 2015-09-03 DIAGNOSIS — D509 Iron deficiency anemia, unspecified: Secondary | ICD-10-CM | POA: Insufficient documentation

## 2015-09-03 DIAGNOSIS — I509 Heart failure, unspecified: Secondary | ICD-10-CM | POA: Diagnosis not present

## 2015-09-03 DIAGNOSIS — F17200 Nicotine dependence, unspecified, uncomplicated: Secondary | ICD-10-CM | POA: Insufficient documentation

## 2015-09-03 DIAGNOSIS — Z89511 Acquired absence of right leg below knee: Secondary | ICD-10-CM | POA: Diagnosis not present

## 2015-09-03 DIAGNOSIS — Z79899 Other long term (current) drug therapy: Secondary | ICD-10-CM | POA: Insufficient documentation

## 2015-09-03 DIAGNOSIS — I129 Hypertensive chronic kidney disease with stage 1 through stage 4 chronic kidney disease, or unspecified chronic kidney disease: Secondary | ICD-10-CM | POA: Insufficient documentation

## 2015-09-03 DIAGNOSIS — Z882 Allergy status to sulfonamides status: Secondary | ICD-10-CM | POA: Insufficient documentation

## 2015-09-03 DIAGNOSIS — N185 Chronic kidney disease, stage 5: Secondary | ICD-10-CM

## 2015-09-03 DIAGNOSIS — Z9889 Other specified postprocedural states: Secondary | ICD-10-CM | POA: Diagnosis not present

## 2015-09-03 DIAGNOSIS — J392 Other diseases of pharynx: Secondary | ICD-10-CM

## 2015-09-03 DIAGNOSIS — E785 Hyperlipidemia, unspecified: Secondary | ICD-10-CM | POA: Diagnosis not present

## 2015-09-03 DIAGNOSIS — Q2733 Arteriovenous malformation of digestive system vessel: Secondary | ICD-10-CM

## 2015-09-03 DIAGNOSIS — I2510 Atherosclerotic heart disease of native coronary artery without angina pectoris: Secondary | ICD-10-CM | POA: Diagnosis not present

## 2015-09-03 DIAGNOSIS — K5521 Angiodysplasia of colon with hemorrhage: Secondary | ICD-10-CM

## 2015-09-03 LAB — CBC WITH DIFFERENTIAL/PLATELET
BASOS ABS: 0 10*3/uL (ref 0.0–0.1)
Basophils Relative: 0 %
EOS ABS: 0.4 10*3/uL (ref 0.0–0.7)
EOS PCT: 6 %
HCT: 30.5 % — ABNORMAL LOW (ref 39.0–52.0)
Hemoglobin: 10.2 g/dL — ABNORMAL LOW (ref 13.0–17.0)
Lymphocytes Relative: 11 %
Lymphs Abs: 0.7 10*3/uL (ref 0.7–4.0)
MCH: 30.6 pg (ref 26.0–34.0)
MCHC: 33.4 g/dL (ref 30.0–36.0)
MCV: 91.6 fL (ref 78.0–100.0)
MONO ABS: 0.4 10*3/uL (ref 0.1–1.0)
Monocytes Relative: 7 %
Neutro Abs: 4.6 10*3/uL (ref 1.7–7.7)
Neutrophils Relative %: 76 %
PLATELETS: 237 10*3/uL (ref 150–400)
RBC: 3.33 MIL/uL — AB (ref 4.22–5.81)
RDW: 16.1 % — AB (ref 11.5–15.5)
WBC: 6.1 10*3/uL (ref 4.0–10.5)

## 2015-09-03 LAB — RENAL FUNCTION PANEL
ALBUMIN: 3.1 g/dL — AB (ref 3.5–5.0)
ANION GAP: 7 (ref 5–15)
BUN: 59 mg/dL — AB (ref 6–20)
CO2: 23 mmol/L (ref 22–32)
Calcium: 8.5 mg/dL — ABNORMAL LOW (ref 8.9–10.3)
Chloride: 105 mmol/L (ref 101–111)
Creatinine, Ser: 4.03 mg/dL — ABNORMAL HIGH (ref 0.61–1.24)
GFR calc Af Amer: 17 mL/min — ABNORMAL LOW (ref >60)
GFR, EST NON AFRICAN AMERICAN: 14 mL/min — AB (ref >60)
Glucose, Bld: 127 mg/dL — ABNORMAL HIGH (ref 65–99)
PHOSPHORUS: 4.4 mg/dL (ref 2.5–4.6)
POTASSIUM: 5 mmol/L (ref 3.5–5.1)
Sodium: 135 mmol/L (ref 135–145)

## 2015-09-03 LAB — PROTEIN / CREATININE RATIO, URINE
CREATININE, URINE: 118.66 mg/dL
PROTEIN CREATININE RATIO: 1.74 mg/mg{creat} — AB (ref 0.00–0.15)
Total Protein, Urine: 207 mg/dL

## 2015-09-03 LAB — IRON AND TIBC
IRON: 35 ug/dL — AB (ref 45–182)
SATURATION RATIOS: 16 % — AB (ref 17.9–39.5)
TIBC: 214 ug/dL — ABNORMAL LOW (ref 250–450)
UIBC: 179 ug/dL

## 2015-09-03 LAB — FERRITIN: Ferritin: 136 ng/mL (ref 24–336)

## 2015-09-03 NOTE — Patient Instructions (Addendum)
[place] at [facility] Discharge Instructions  RECOMMENDATIONS MADE BY THE CONSULTANT AND ANY TEST RESULTS WILL BE SENT TO YOUR REFERRING PHYSICIAN.   Exam and discussion by Dr Whitney Muse today Blood counts are stable, hemoglobin 10.2 Other labs still pending, we will call you with those results Referral to Dr Benjamine Mola, they will call you with this appointment  Labs every 2 weeks  Return to see the doctor in 1 month Please call the clinic if you have any questions or concerns    Thank you for choosing [place] at [facility] to provide your oncology and hematology care.  To afford each patient quality time with our provider, please arrive at least 15 minutes before your scheduled appointment time.   Beginning January 23rd 2017 lab work for the [facility] will be done in the  Main lab at [facility] on 1st floor. If you have a lab appointment with the [facility] please come in thru the  Main Entrance and check in at the main information desk  You need to re-schedule your appointment should you arrive 10 or more minutes late.  We strive to give you quality time with our providers, and arriving late affects you and other patients whose appointments are after yours.  Also, if you no show three or more times for appointments you may be dismissed from the clinic at the providers discretion.     Again, thank you for choosing [facility].  Our hope is that these requests will decrease the amount of time that you wait before being seen by our physicians.       _____________________________________________________________  Should you have questions after your visit to [facility], please contact our office at [phone] between the hours of 8:30 a.m. and 4:30 p.m.  Voicemails left after 4:30 p.m. will not be returned until the following business day.  For prescription refill requests, have your pharmacy contact our  office.         Resources For Cancer Patients and their Caregivers ? American [employer] Society: Can assist with transportation, wigs, general needs, runs Look Good Feel Better.        662-483-2532 ? Cancer Care: Provides financial assistance, online support groups, medication/co-pay assistance.  1-800-813-HOPE 351-443-8570) ? [facility] Assists [place] cancer patients and their families through emotional , educational and financial support.  (305) 380-3117 ? [place] DSS Where to apply for food stamps, Medicaid and utility assistance. 2535289953 ? [facility]: Transportation to medical appointments. 330-676-7769 ? Social Security Administration: May apply for disability if have a Stage IV cancer. 912-296-5392 7656284279 ? [employer], Disability and Transit Services: Assists with nutrition, care and transit needs. 808-767-1864

## 2015-09-03 NOTE — Progress Notes (Signed)
[name], [place] [id number]  Iron deficiency anemia due to chronic blood loss - Plan: Practitioner attestation of consent, Complete patient signature process for consent form, Care order/instruction  CURRENT THERAPY: IV Feraheme to support iron loss.  Oncology Flowsheet 02/05/2014 04/03/2014 04/09/2014  diphenhydrAMINE (BENADRYL) PO     ferric gluconate (NULECIT) IV     ferumoxytol (FERAHEME) IV 510 mg 510 mg 510 mg  ondansetron (ZOFRAN) IJ      Oncology Flowsheet 12/12/2014 12/20/2014 03/13/2015 03/17/2015  diphenhydrAMINE (BENADRYL) PO      ferric gluconate (NULECIT) IV 125 mg 125 mg 125 mg 125 mg  ferumoxytol (FERAHEME) IV      ondansetron (ZOFRAN) IJ       Oncology Flowsheet 06/13/2015 06/20/2015 07/18/2015  diphenhydrAMINE (BENADRYL) PO   25 mg  ferric gluconate (NULECIT) IV 125 mg 125 mg   ferumoxytol (FERAHEME) IV   510 mg  ondansetron (ZOFRAN) IJ      Oncology Flowsheet 07/25/2015  diphenhydrAMINE (BENADRYL) PO   ferric gluconate (NULECIT) IV   ferumoxytol (FERAHEME) IV 510 mg  ondansetron (ZOFRAN) IJ    INTERVAL HISTORY: [name] 65 y.o. 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   [name] is accompanied by his wife. He was admitted to the hospital on Sunday, 3/12. He was anemic, with acute on chronic renal failure.   He feels better after receiving the blood transfusion, however he does not feel as good as he believes he should.  His left arm has started swelling up over a week ago, before having an IV in place. He did not hurt his arm, that he remembers. His wife states that he does not pay att2/28/ention. An ultrasound of his left arm was performed yesterday, 3/14. They both note there is no DVT.  His stool is currently brown. He did notice when it previously turned to black. He denies any "obvious" GI bleeding. He underwent an EGD on 2/28 with Dr. Oneida Alar. He has gastric  AVM's  His appetite is poor due to taste loss. He only eats a few bites of each meal. The only thing he is able to get down consistently is milk.   The patient continues to smoke, though he is trying to quit. He has never tried medicines to quit smoking.   He will follow up with Dr. Lowanda Foster on the 27th. He does not want to go to [facility] and would rather wait for Dr. Lowanda Foster in his [place] office.   His fingers feel fine. He has not fallen. He experienced dry heaving from not having anything on his stomach. Denies vomiting. Denies chest pain, even when his blood counts were low. However, he did experience leg weakness when his blood counts were low.  Past Medical History  Diagnosis Date  . Diverticulosis 03/04/10  . Peripheral vascular disease, unspecified ([place])   . Undiagnosed cardiac murmurs   . Other symptoms involving cardiovascular system   . HLD (hyperlipidemia)   . HTN (hypertension)   . Type II or unspecified type diabetes mellitus without mention of complication, not stated as uncontrolled   . Ulcer   . Carotid artery occlusion   . Umbilical hernia now    has not been repaired  . Blood transfusion   . AVM (arteriovenous malformation) of colon with hemorrhage   . Gastritis   . Internal hemorrhoids   . Right carotid bruit   . Shortness  of breath     noted /w low Hgb  . GERD (gastroesophageal reflux disease)   . Chronic kidney disease     renal failure /w osteomyelitis- 2010  . Arthritis     back  . Anemia     8/3,4,5- blood transfusion- [facility], colonoscopy- done & found 2 areas of bleeding   . Tobacco use disorder   . Anxiety   . CHF (congestive heart failure) ([place])     3 yrs ago  . Irregular heart beat   . 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[place]); Diabetic peripheral neuropathy ([place]); Iron deficiency anemia due to chronic blood loss; Noncompliance; Occlusion and stenosis of carotid artery without mention of cerebral infarction; GI bleeding; Hyponatremia; Acute renal failure ([place]); Aftercare following surgery of the circulatory system, NEC; AVM (arteriovenous malformation) of colon with hemorrhage; GERD (gastroesophageal reflux disease); Anxiety; CHF (congestive heart failure) ([facility]); BPH (benign prostatic hyperplasia); Nausea without vomiting; Abdominal bloating; Dyspepsia; Dysphagia; Anemia; Constipation; Acute renal failure superimposed on stage 4 chronic kidney disease ([place]); Weakness; Mass of oral cavity; Left arm swelling; Hyperkalemia; and Absolute anemia on his problem list.     is allergic to zocor; asa; hctz; iodine; and sulfa antibiotics.  [name] does not currently have medications on file.  Past Surgical History  Procedure Laterality Date  . Colonoscopy  03/04/2010    Dr. Princella Pellegrini AMV without mention of ablation, scattered diverticula, internal hemorrhoids  . Bilateral common superficial femoral and profudus artery endarterectomies      2003    Dr. Sherren Mocha Early  . Esophagogastroduodenoscopy  03/2000    Dr. Tharon Aquas gastritis, H.Pylori gastritis, ?treated  . Esophagogastroduodenoscopy  05/2006    Dr. Marcello Fennel recurrent GI bleed. antral gastritis, single gastric AVM s/p APC, CLO results?  . Colonoscopy  11/2004    Dr. Barkley Bruns  . 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 Both of these are ablated with argon plasma coagulator./ Small external hemorrhoids  . Esophagogastroduodenoscopy  01/21/2012    SLF: MILD TO MAODERATE Gastritis/ SLOW GIB LIKEY DUE TO PT BEING ON ASA ND SUBSEQUENT BLOOD/ LOSS FROM AVMS IN  STOMACH AND COLON AS WELL AS GASTRITIS  . Enteroscopy  01/21/2012    Procedure: ENTEROSCOPY;  Surgeon: Danie Binder, MD;  [facility];  Service: Endoscopy;;  . No past surgeries    . Vascular surgery      rt bka and rt 1st toe amputation  . Below knee leg amputation      2010, wears prosthesis   . Endarterectomy  02/09/2012    Procedure: ENDARTERECTOMY CAROTID;  Surgeon: Rosetta Posner, MD;  Location: [facility];  Service: Vascular;  Laterality: Left;  . Carotid endarterectomy Left 02-09-12  . Esophagogastroduodenoscopy N/A 09/03/2013    Mild chronic gastritis. benign gastric polyps  . Givens capsule study N/A 09/03/2013    Dr. Oneida Alar: small bowel and colonic AVMs. no active bleeding  . Esophagogastroduodenoscopy N/A 08/19/2015    Dr. Oneida Alar: 2 large gastric AVMs in fundus, 1 actively bleeding s/p APC and clip placement, empiric Savary dilation  . Savory dilation N/A 08/19/2015  Procedure: SAVORY DILATION;  Surgeon: Danie Binder, MD;  [facility];  Service: Endoscopy;  Laterality: N/A;   REVIEW OF SYSTEMS: Denies any headaches, dizziness, double vision, fevers, chills, night sweats, nausea, vomiting, diarrhea, constipation, chest pain, heart palpitations, shortness of breath, blood in stool, black tarry stool, urinary pain, urinary burning, urinary frequency, hematuria. Positive for arm swelling.     Left arm swelling.  Positive for appetite loss.     Taste loss 14 point review of systems was performed and is negative except as detailed under history of present illness and above  PHYSICAL EXAMINATION  ECOG PERFORMANCE STATUS: 0 - Asymptomatic  Filed Vitals:   09/03/15 1252  BP: 128/62  Pulse: 77  Temp: 97.6 F (36.4 C)  Resp: 18    GENERAL:alert, no distress, well nourished, well developed, comfortable, cooperative, smiling and accompanied by his wife. SKIN: skin color, texture, turgor are normal, no rashes or significant lesions HEAD: Normocephalic, No  masses, lesions, tenderness or abnormalities MOUTH: 1/2 cm growth noted in the posterior aspect of the right tonsillar pillar EYES: normal, PERRLA, EOMI, Conjunctiva are pink and non-injected EARS: External ears normal OROPHARYNX:lips, buccal mucosa, and tongue normal and mucous membranes are moist  NECK: supple, thyroid normal size, non-tender, without nodularity, no stridor, non-tender, trachea midline LYMPH:  no palpable lymphadenopathy BREAST:not examined LUNGS: clear to auscultation and percussion, decreased breath sounds HEART: regular rate & rhythm, no murmurs, no gallops, S1 normal and S2 normal ABDOMEN:abdomen soft, non-tender, normal bowel sounds and no masses or organomegaly BACK: Back symmetric, no curvature., No CVA tenderness EXTREMITIES:less then 2 second capillary refill, no joint deformities, effusion, or inflammation, no edema, no skin discoloration, Positive findings:  Right BLK amputation.  Left arm edematous 1.5 times the size of the right. No erythema NEURO: alert & oriented x 3 with fluent speech, no focal motor/sensory deficits, gait normal   LABORATORY DATA: I have reviewed the data as listed. CBC    Component Value Date/Time   WBC 6.1 09/03/2015 1225   RBC 3.33* 09/03/2015 1225   RBC 3.51* 09/14/2013 1430   HGB 10.2* 09/03/2015 1225   HGB 8.0* 09/13/2013 0840   HCT 30.5* 09/03/2015 1225   PLT 237 09/03/2015 1225   MCV 91.6 09/03/2015 1225   MCH 30.6 09/03/2015 1225   MCHC 33.4 09/03/2015 1225   RDW 16.1* 09/03/2015 1225   LYMPHSABS 0.7 09/03/2015 1225   MONOABS 0.4 09/03/2015 1225   EOSABS 0.4 09/03/2015 1225   BASOSABS 0.0 09/03/2015 1225      Chemistry      Component Value Date/Time   NA 135 09/03/2015 1230   NA 140 05/06/2015 1512   K 5.0 09/03/2015 1230   CL 105 09/03/2015 1230   CO2 23 09/03/2015 1230   BUN 59* 09/03/2015 1230   BUN 34* 05/06/2015 1512   CREATININE 4.03* 09/03/2015 1230   CREATININE 1.59* 12/06/2012 0852      Component  Value Date/Time   CALCIUM 8.5* 09/03/2015 1230   ALKPHOS 109 05/06/2015 1512   AST 14 05/06/2015 1512   ALT 10 05/06/2015 1512   BILITOT 0.2 05/06/2015 1512   BILITOT 0.3 03/06/2015 0953     Results for JOVANNY, STEPHANIE (MRN [id number])   Ref. Range 06/05/2015 12:29 07/17/2015 09:45 08/11/2015 10:45 08/28/2015 15:25 08/31/2015 13:44 09/01/2015 07:01 09/02/2015 04:23 09/03/2015 12:25  Hemoglobin Latest Ref Range: 13.0-17.0 g/dL 10.5 (L) 6.5 (LL) 10.2 (L) 8.0 (L) 8.7 (L) 10.7 (L) 10.6 (L) 10.2 (L)  Results for JAKHAI, FANT (MRN [id number])   Ref. Range 06/03/2014 09:18 09/03/2014 10:51 12/04/2014 10:33 03/06/2015 09:53 06/05/2015 12:30 07/17/2015 09:46 08/11/2015 10:46 09/03/2015 12:25  Ferritin Latest Ref Range: 24-336 ng/mL 284 174 36 52 51 29 250 136   RADIOLOGY RESULTS:  CLINICAL DATA: Left upper extremity swelling for 4-6 weeks.  EXAM: LEFT UPPER EXTREMITY VENOUS DOPPLER ULTRASOUND  TECHNIQUE: Gray-scale sonography with graded compression, as well as color Doppler and duplex ultrasound were performed to evaluate the upper extremity deep venous system from the level of the subclavian vein and including the jugular, axillary, basilic and upper cephalic vein. Spectral Doppler was utilized to evaluate flow at rest and with distal augmentation maneuvers.  COMPARISON: None.  FINDINGS: Thrombus within deep veins: None visualized.  Normal compressibility, phasicity and color Doppler flow in the left internal jugular vein. Normal compressibility, Doppler flow and augmentation in the left subclavian vein. Normal compressibility, color Doppler flow and augmentation in left axillary vein. Normal compressibility, color Doppler flow and augmentation in the left brachial veins. The left radial and ulnar veins are patent. Normal compressibility, color Doppler flow and augmentation in left cephalic vein. Normal compressibility, color Doppler flow and augmentation in the left basilic  vein.  Other findings: Normal compressibility and color Doppler flow in the right subclavian vein.  IMPRESSION: Negative for deep venous thrombosis in the left upper extremity.   Electronically Signed  By: Markus Daft M.D.  On: 09/01/2015 14:52       ASSESSMENT AND PLAN:  Intestinal AVM's  Gastric AVM's Iron deficiency anemia secondary to chronic GI related blood loss CKD, Stage V EGD August 19, 2015 with gastric AVMs s/p APC therapy and clip placement LUE swelling, negative Ultrasound on 09/01/2015 Oropharyngeal lesion  His arm was ultrasounded on 3/13 and found to be negative for any DVT. He was instructed to notify us if his symptoms worsen.   He has an appointment with GI to set up a screening colonoscopy appointment.  I will refer the patient to Dr. Benjamine Mola of ENT for the lesion noted in the back of his throat.He is agreeable to the referral.  I addressed the importance of smoking cessation with the patient in detail.  We discussed the health benefits of cessation.  We discussed the health detriments of ongoing tobacco use including but not limited to COPD, heart disease and malignancy. We reviewed the multiple options for cessation. We will continue to address this moving forward.  I have set him up for standing lab every 2 weeks.  He will return in one month.   Orders Placed This Encounter  Procedures  . PTH, intact and calcium  . CBC with Differential    Standing Status: Standing     Number of Occurrences: 6     Standing Expiration Date: 09/02/2016    All questions were answered. The patient knows to call the clinic with any problems, questions or concerns. We can certainly see the patient much sooner if necessary.  This document serves as a record of services personally performed by Ancil Linsey, MD. It was created on her behalf by Arlyce Harman, a trained medical scribe. The creation of this record is based on the scribe's personal observations and  the provider's statements to them. This document has been checked and approved by the attending provider.  I have reviewed the above documentation for accuracy and completeness, and I agree with the above.  This note is electronically signed by: Molli Hazard, MD  09/03/2015 1:30 PM

## 2015-09-04 ENCOUNTER — Encounter (HOSPITAL_COMMUNITY): Payer: Self-pay | Admitting: Lab

## 2015-09-04 LAB — VITAMIN D 25 HYDROXY (VIT D DEFICIENCY, FRACTURES): Vit D, 25-Hydroxy: 38.4 ng/mL (ref 30.0–100.0)

## 2015-09-04 NOTE — Progress Notes (Signed)
Referral sent to Dr Benjamine Mola.  Records faxec on 3/16

## 2015-09-05 LAB — PTH, INTACT AND CALCIUM
CALCIUM TOTAL (PTH): 8.4 mg/dL — AB (ref 8.6–10.2)
PTH: 34 pg/mL (ref 15–65)

## 2015-09-12 ENCOUNTER — Encounter (HOSPITAL_COMMUNITY): Payer: Self-pay | Admitting: Oncology

## 2015-09-12 ENCOUNTER — Ambulatory Visit: Payer: Medicare Other | Admitting: Nurse Practitioner

## 2015-09-15 ENCOUNTER — Telehealth: Payer: Self-pay | Admitting: Nurse Practitioner

## 2015-09-15 ENCOUNTER — Encounter: Payer: Self-pay | Admitting: Nurse Practitioner

## 2015-09-15 DIAGNOSIS — K1370 Unspecified lesions of oral mucosa: Secondary | ICD-10-CM

## 2015-09-16 NOTE — Telephone Encounter (Signed)
Patient aware of results.

## 2015-09-16 NOTE — Telephone Encounter (Signed)
Dr Vevelyn Royals is ENT in [place] - wants this for a sore that is in his mouth - in the very back - may need biopsy per cancer center.

## 2015-09-16 NOTE — Telephone Encounter (Signed)
Referral made to DR. Vevelyn Royals

## 2015-09-16 NOTE — Telephone Encounter (Signed)
i need to know who Dr. Vevelyn Royals is

## 2015-09-17 ENCOUNTER — Encounter (HOSPITAL_COMMUNITY): Payer: Medicare Other

## 2015-09-17 ENCOUNTER — Other Ambulatory Visit (HOSPITAL_COMMUNITY)
Admission: RE | Admit: 2015-09-17 | Discharge: 2015-09-17 | Disposition: A | Discharge status: Home / Self Care | Payer: Medicare Other | Source: Other Acute Inpatient Hospital | Attending: Nephrology | Admitting: Nephrology

## 2015-09-17 DIAGNOSIS — I739 Peripheral vascular disease, unspecified: Secondary | ICD-10-CM | POA: Diagnosis not present

## 2015-09-17 DIAGNOSIS — K219 Gastro-esophageal reflux disease without esophagitis: Secondary | ICD-10-CM | POA: Diagnosis not present

## 2015-09-17 DIAGNOSIS — Z89511 Acquired absence of right leg below knee: Secondary | ICD-10-CM | POA: Diagnosis not present

## 2015-09-17 DIAGNOSIS — Z9889 Other specified postprocedural states: Secondary | ICD-10-CM | POA: Diagnosis not present

## 2015-09-17 DIAGNOSIS — D500 Iron deficiency anemia secondary to blood loss (chronic): Secondary | ICD-10-CM

## 2015-09-17 DIAGNOSIS — Z882 Allergy status to sulfonamides status: Secondary | ICD-10-CM | POA: Diagnosis not present

## 2015-09-17 DIAGNOSIS — N185 Chronic kidney disease, stage 5: Secondary | ICD-10-CM | POA: Diagnosis not present

## 2015-09-17 DIAGNOSIS — K2970 Gastritis, unspecified, without bleeding: Secondary | ICD-10-CM | POA: Diagnosis not present

## 2015-09-17 DIAGNOSIS — E785 Hyperlipidemia, unspecified: Secondary | ICD-10-CM | POA: Diagnosis not present

## 2015-09-17 DIAGNOSIS — I509 Heart failure, unspecified: Secondary | ICD-10-CM | POA: Diagnosis not present

## 2015-09-17 DIAGNOSIS — M1990 Unspecified osteoarthritis, unspecified site: Secondary | ICD-10-CM | POA: Diagnosis not present

## 2015-09-17 DIAGNOSIS — I2510 Atherosclerotic heart disease of native coronary artery without angina pectoris: Secondary | ICD-10-CM | POA: Diagnosis not present

## 2015-09-17 DIAGNOSIS — E1122 Type 2 diabetes mellitus with diabetic chronic kidney disease: Secondary | ICD-10-CM | POA: Diagnosis not present

## 2015-09-17 DIAGNOSIS — I129 Hypertensive chronic kidney disease with stage 1 through stage 4 chronic kidney disease, or unspecified chronic kidney disease: Secondary | ICD-10-CM | POA: Diagnosis not present

## 2015-09-17 DIAGNOSIS — D509 Iron deficiency anemia, unspecified: Secondary | ICD-10-CM | POA: Diagnosis not present

## 2015-09-17 LAB — CBC WITH DIFFERENTIAL/PLATELET
BASOS ABS: 0 10*3/uL (ref 0.0–0.1)
BASOS PCT: 0 %
EOS ABS: 0.3 10*3/uL (ref 0.0–0.7)
EOS PCT: 5 %
HCT: 32.9 % — ABNORMAL LOW (ref 39.0–52.0)
Hemoglobin: 10.8 g/dL — ABNORMAL LOW (ref 13.0–17.0)
Lymphocytes Relative: 11 %
Lymphs Abs: 0.8 10*3/uL (ref 0.7–4.0)
MCH: 29.9 pg (ref 26.0–34.0)
MCHC: 32.8 g/dL (ref 30.0–36.0)
MCV: 91.1 fL (ref 78.0–100.0)
MONO ABS: 0.6 10*3/uL (ref 0.1–1.0)
Monocytes Relative: 8 %
Neutro Abs: 5.6 10*3/uL (ref 1.7–7.7)
Neutrophils Relative %: 76 %
PLATELETS: 221 10*3/uL (ref 150–400)
RBC: 3.61 MIL/uL — AB (ref 4.22–5.81)
RDW: 15.4 % (ref 11.5–15.5)
WBC: 7.4 10*3/uL (ref 4.0–10.5)

## 2015-09-18 ENCOUNTER — Ambulatory Visit (INDEPENDENT_AMBULATORY_CARE_PROVIDER_SITE_OTHER): Payer: Medicare Other | Admitting: Nurse Practitioner

## 2015-09-18 ENCOUNTER — Encounter: Payer: Self-pay | Admitting: Nurse Practitioner

## 2015-09-18 VITALS — BP 129/70 | HR 82 | Temp 97.0°F | Ht 64.0 in | Wt 152.0 lb

## 2015-09-18 DIAGNOSIS — L03114 Cellulitis of left upper limb: Secondary | ICD-10-CM

## 2015-09-18 DIAGNOSIS — E875 Hyperkalemia: Secondary | ICD-10-CM | POA: Diagnosis not present

## 2015-09-18 DIAGNOSIS — I10 Essential (primary) hypertension: Secondary | ICD-10-CM

## 2015-09-18 DIAGNOSIS — Z111 Encounter for screening for respiratory tuberculosis: Secondary | ICD-10-CM

## 2015-09-18 DIAGNOSIS — K1370 Unspecified lesions of oral mucosa: Secondary | ICD-10-CM | POA: Diagnosis not present

## 2015-09-18 LAB — HEPATITIS B SURFACE ANTIBODY,QUALITATIVE: Hep B S Ab: NONREACTIVE

## 2015-09-18 LAB — HEPATITIS C ANTIBODY (REFLEX)

## 2015-09-18 LAB — HCV COMMENT:

## 2015-09-18 LAB — HEPATITIS B SURFACE ANTIGEN: HEP B S AG: NEGATIVE

## 2015-09-18 MED ORDER — VALSARTAN 160 MG PO TABS: 160.0000 mg | ORAL_TABLET | Freq: Every day | ORAL | Status: DC

## 2015-09-18 MED ORDER — DOXYCYCLINE HYCLATE 100 MG PO TABS: 100.0000 mg | ORAL_TABLET | Freq: Two times a day (BID) | ORAL | Status: DC

## 2015-09-18 NOTE — Progress Notes (Signed)
   Subjective:    Patient ID: [name], male    DOB: 12-04-1950, 65 y.o.   MRN: [id number]  HPI Patient in c/o left arm swelling and left facial swelling- having trouble swallowing and can only eat real soft foods. He had a gastroscopy 1 month ago and that is when he started having problems. Left arm is so swollen tat fluid is leaking from it.  * patient was on valsartan '360mg'$  daily- hospital stopped it because his K+ was elevated- blood pressure has been running 694'H systolic at home.  Review of Systems  Constitutional: Positive for appetite change (decrease).  HENT: Negative.   Respiratory: Negative.   Cardiovascular: Negative.   Gastrointestinal: Positive for abdominal pain (slight).  Genitourinary: Negative.   Musculoskeletal: Negative.   Neurological: Negative.   Psychiatric/Behavioral: Negative.   All other systems reviewed and are negative.      Objective:   Physical Exam  Constitutional: He is oriented to person, place, and time. He appears well-developed and well-nourished. No distress.  HENT:  2cm smooth lesion right posterior pharynx No facial edema noted today  Cardiovascular: Normal rate, regular rhythm and normal heart sounds.   Pulmonary/Chest: Effort normal and breath sounds normal.  Abdominal: Soft. Bowel sounds are normal. There is no tenderness.  Neurological: He is alert and oriented to person, place, and time.  Skin: Skin is warm.  Left forearm edema with clear fluid drainage- erythematous and slightly warm to touch  Psychiatric: He has a normal mood and affect. His behavior is normal. Judgment and thought content normal.   BP 129/70 mmHg  Pulse 82  Temp(Src) 97 F (36.1 C) (Oral)  Ht '5\' 4"'$  (1.626 m)  Wt 152 lb (68.947 kg)  BMI 26.08 kg/m2        Assessment & Plan:   1. Cellulitis of left forearm    Ice Meds ordered this encounter  Medications  . doxycycline (VIBRA-TABS) 100 MG tablet    Sig: Take 1 tablet (100 mg total) by mouth 2  (two) times daily. 1 po bid    Dispense:  20 tablet    Refill:  0    Order Specific Question:  Supervising Provider    Answer:  Chipper Herb [[id number]]   RTO prn  2. Oral lesion Have made referral to ENT for BX   3. Hypertension Added valsartan '160mg'$  back at lower dose-  RTO in 2 weeks to recheck K+   [name] Done, FNP

## 2015-09-18 NOTE — Addendum Note (Signed)
Addended by: Rolena Infante on: 09/18/2015 12:05 PM   Modules accepted: Orders

## 2015-09-19 ENCOUNTER — Ambulatory Visit: Payer: Medicaid Other | Admitting: Internal Medicine

## 2015-09-20 ENCOUNTER — Encounter (HOSPITAL_COMMUNITY): Payer: Self-pay | Admitting: Emergency Medicine

## 2015-09-20 ENCOUNTER — Emergency Department (HOSPITAL_COMMUNITY)
Admission: EM | Admit: 2015-09-20 | Discharge: 2015-09-20 | Disposition: A | Discharge status: Home / Self Care | Payer: Medicare Other | Attending: Emergency Medicine | Admitting: Emergency Medicine

## 2015-09-20 DIAGNOSIS — R2232 Localized swelling, mass and lump, left upper limb: Secondary | ICD-10-CM | POA: Diagnosis not present

## 2015-09-20 DIAGNOSIS — I120 Hypertensive chronic kidney disease with stage 5 chronic kidney disease or end stage renal disease: Secondary | ICD-10-CM | POA: Insufficient documentation

## 2015-09-20 DIAGNOSIS — N185 Chronic kidney disease, stage 5: Secondary | ICD-10-CM | POA: Insufficient documentation

## 2015-09-20 DIAGNOSIS — R112 Nausea with vomiting, unspecified: Secondary | ICD-10-CM | POA: Insufficient documentation

## 2015-09-20 DIAGNOSIS — J189 Pneumonia, unspecified organism: Secondary | ICD-10-CM

## 2015-09-20 DIAGNOSIS — R011 Cardiac murmur, unspecified: Secondary | ICD-10-CM | POA: Diagnosis not present

## 2015-09-20 DIAGNOSIS — I509 Heart failure, unspecified: Secondary | ICD-10-CM | POA: Insufficient documentation

## 2015-09-20 DIAGNOSIS — E119 Type 2 diabetes mellitus without complications: Secondary | ICD-10-CM | POA: Insufficient documentation

## 2015-09-20 DIAGNOSIS — R220 Localized swelling, mass and lump, head: Secondary | ICD-10-CM | POA: Diagnosis not present

## 2015-09-20 DIAGNOSIS — F17210 Nicotine dependence, cigarettes, uncomplicated: Secondary | ICD-10-CM | POA: Insufficient documentation

## 2015-09-20 HISTORY — DX: Pneumonia, unspecified organism: J18.9

## 2015-09-20 LAB — TB SKIN TEST
INDURATION: 0 mm
TB SKIN TEST: NEGATIVE

## 2015-09-20 NOTE — ED Notes (Addendum)
Pt states he had an Endo last month and after began having left arm and facial swelling, patient states since this he's also felt nauseous. Patient has been this way over the last month, but said he wanted to come here because [facility] hadn't been helping him. Denies pain, vomiting, diarrhea

## 2015-09-23 ENCOUNTER — Other Ambulatory Visit: Payer: Self-pay | Admitting: Family

## 2015-09-26 ENCOUNTER — Encounter (HOSPITAL_COMMUNITY): Payer: Self-pay | Admitting: Emergency Medicine

## 2015-09-26 ENCOUNTER — Emergency Department (HOSPITAL_COMMUNITY): Payer: Medicare Other

## 2015-09-26 ENCOUNTER — Observation Stay (HOSPITAL_COMMUNITY)
Admission: EM | Admit: 2015-09-26 | Discharge: 2015-09-30 | Disposition: A | Discharge status: Home / Self Care | Payer: Medicare Other | Attending: Internal Medicine | Admitting: Internal Medicine

## 2015-09-26 DIAGNOSIS — Z888 Allergy status to other drugs, medicaments and biological substances status: Secondary | ICD-10-CM | POA: Insufficient documentation

## 2015-09-26 DIAGNOSIS — M1990 Unspecified osteoarthritis, unspecified site: Secondary | ICD-10-CM | POA: Insufficient documentation

## 2015-09-26 DIAGNOSIS — Z881 Allergy status to other antibiotic agents status: Secondary | ICD-10-CM | POA: Diagnosis not present

## 2015-09-26 DIAGNOSIS — Z886 Allergy status to analgesic agent status: Secondary | ICD-10-CM | POA: Diagnosis not present

## 2015-09-26 DIAGNOSIS — E785 Hyperlipidemia, unspecified: Secondary | ICD-10-CM | POA: Diagnosis not present

## 2015-09-26 DIAGNOSIS — R0602 Shortness of breath: Secondary | ICD-10-CM

## 2015-09-26 DIAGNOSIS — F419 Anxiety disorder, unspecified: Secondary | ICD-10-CM | POA: Insufficient documentation

## 2015-09-26 DIAGNOSIS — I10 Essential (primary) hypertension: Secondary | ICD-10-CM | POA: Diagnosis not present

## 2015-09-26 DIAGNOSIS — F17200 Nicotine dependence, unspecified, uncomplicated: Secondary | ICD-10-CM | POA: Insufficient documentation

## 2015-09-26 DIAGNOSIS — I509 Heart failure, unspecified: Secondary | ICD-10-CM | POA: Diagnosis not present

## 2015-09-26 DIAGNOSIS — K219 Gastro-esophageal reflux disease without esophagitis: Secondary | ICD-10-CM | POA: Diagnosis not present

## 2015-09-26 DIAGNOSIS — Z89519 Acquired absence of unspecified leg below knee: Secondary | ICD-10-CM | POA: Diagnosis not present

## 2015-09-26 DIAGNOSIS — N185 Chronic kidney disease, stage 5: Secondary | ICD-10-CM | POA: Insufficient documentation

## 2015-09-26 DIAGNOSIS — Z7984 Long term (current) use of oral hypoglycemic drugs: Secondary | ICD-10-CM | POA: Insufficient documentation

## 2015-09-26 DIAGNOSIS — S88119A Complete traumatic amputation at level between knee and ankle, unspecified lower leg, initial encounter: Secondary | ICD-10-CM

## 2015-09-26 DIAGNOSIS — Z882 Allergy status to sulfonamides status: Secondary | ICD-10-CM | POA: Diagnosis not present

## 2015-09-26 DIAGNOSIS — E1122 Type 2 diabetes mellitus with diabetic chronic kidney disease: Secondary | ICD-10-CM | POA: Diagnosis not present

## 2015-09-26 DIAGNOSIS — Z79899 Other long term (current) drug therapy: Secondary | ICD-10-CM | POA: Insufficient documentation

## 2015-09-26 DIAGNOSIS — I2510 Atherosclerotic heart disease of native coronary artery without angina pectoris: Secondary | ICD-10-CM | POA: Diagnosis not present

## 2015-09-26 DIAGNOSIS — R918 Other nonspecific abnormal finding of lung field: Secondary | ICD-10-CM

## 2015-09-26 DIAGNOSIS — R1310 Dysphagia, unspecified: Secondary | ICD-10-CM

## 2015-09-26 DIAGNOSIS — I120 Hypertensive chronic kidney disease with stage 5 chronic kidney disease or end stage renal disease: Secondary | ICD-10-CM | POA: Diagnosis not present

## 2015-09-26 DIAGNOSIS — N184 Chronic kidney disease, stage 4 (severe): Secondary | ICD-10-CM | POA: Diagnosis present

## 2015-09-26 DIAGNOSIS — Z91041 Radiographic dye allergy status: Secondary | ICD-10-CM | POA: Diagnosis not present

## 2015-09-26 DIAGNOSIS — J189 Pneumonia, unspecified organism: Secondary | ICD-10-CM | POA: Diagnosis present

## 2015-09-26 DIAGNOSIS — E871 Hypo-osmolality and hyponatremia: Secondary | ICD-10-CM | POA: Diagnosis present

## 2015-09-26 DIAGNOSIS — R109 Unspecified abdominal pain: Secondary | ICD-10-CM | POA: Diagnosis not present

## 2015-09-26 HISTORY — DX: Other nonspecific abnormal finding of lung field: R91.8

## 2015-09-26 LAB — CBC
HEMATOCRIT: 32.8 % — AB (ref 39.0–52.0)
Hemoglobin: 11 g/dL — ABNORMAL LOW (ref 13.0–17.0)
MCH: 30.6 pg (ref 26.0–34.0)
MCHC: 33.5 g/dL (ref 30.0–36.0)
MCV: 91.1 fL (ref 78.0–100.0)
PLATELETS: 262 10*3/uL (ref 150–400)
RBC: 3.6 MIL/uL — ABNORMAL LOW (ref 4.22–5.81)
RDW: 15.1 % (ref 11.5–15.5)
WBC: 9.5 10*3/uL (ref 4.0–10.5)

## 2015-09-26 LAB — COMPREHENSIVE METABOLIC PANEL
ALBUMIN: 3.4 g/dL — AB (ref 3.5–5.0)
ALT: 18 U/L (ref 17–63)
AST: 23 U/L (ref 15–41)
Alkaline Phosphatase: 97 U/L (ref 38–126)
Anion gap: 9 (ref 5–15)
BILIRUBIN TOTAL: 0.4 mg/dL (ref 0.3–1.2)
BUN: 77 mg/dL — AB (ref 6–20)
CHLORIDE: 94 mmol/L — AB (ref 101–111)
CO2: 27 mmol/L (ref 22–32)
CREATININE: 4.05 mg/dL — AB (ref 0.61–1.24)
Calcium: 8.4 mg/dL — ABNORMAL LOW (ref 8.9–10.3)
GFR calc Af Amer: 16 mL/min — ABNORMAL LOW (ref >60)
GFR, EST NON AFRICAN AMERICAN: 14 mL/min — AB (ref >60)
GLUCOSE: 158 mg/dL — AB (ref 65–99)
POTASSIUM: 5.1 mmol/L (ref 3.5–5.1)
Sodium: 130 mmol/L — ABNORMAL LOW (ref 135–145)
TOTAL PROTEIN: 6.9 g/dL (ref 6.5–8.1)

## 2015-09-26 LAB — URINALYSIS, ROUTINE W REFLEX MICROSCOPIC
Bilirubin Urine: NEGATIVE
GLUCOSE, UA: 100 mg/dL — AB
Hgb urine dipstick: NEGATIVE
KETONES UR: NEGATIVE mg/dL
Leukocytes, UA: NEGATIVE
Nitrite: NEGATIVE
PH: 6.5 (ref 5.0–8.0)
PROTEIN: 100 mg/dL — AB
Specific Gravity, Urine: 1.01 (ref 1.005–1.030)

## 2015-09-26 LAB — URINE MICROSCOPIC-ADD ON
Bacteria, UA: NONE SEEN
Squamous Epithelial / LPF: NONE SEEN

## 2015-09-26 LAB — LIPASE, BLOOD: Lipase: 91 U/L — ABNORMAL HIGH (ref 11–51)

## 2015-09-26 MED ORDER — AMLODIPINE BESYLATE 5 MG PO TABS
10.0000 mg | ORAL_TABLET | Freq: Every day | ORAL | Status: DC
  Administered 2015-09-27 – 2015-09-30 (×4): 10 mg via ORAL
  Filled 2015-09-26 (×4): qty 2

## 2015-09-26 MED ORDER — SODIUM CHLORIDE 0.9 % IV SOLN
INTRAVENOUS | Status: DC
  Administered 2015-09-27: via INTRAVENOUS

## 2015-09-26 MED ORDER — ALBUTEROL SULFATE (2.5 MG/3ML) 0.083% IN NEBU: 2.5000 mg | INHALATION_SOLUTION | RESPIRATORY_TRACT | Status: DC | PRN

## 2015-09-26 MED ORDER — ONDANSETRON HCL 4 MG/2ML IJ SOLN
4.0000 mg | Freq: Four times a day (QID) | INTRAMUSCULAR | Status: DC | PRN
  Administered 2015-09-27: 4 mg via INTRAVENOUS
  Filled 2015-09-26: qty 2

## 2015-09-26 MED ORDER — ONDANSETRON HCL 4 MG PO TABS
4.0000 mg | ORAL_TABLET | Freq: Four times a day (QID) | ORAL | Status: DC | PRN
  Administered 2015-09-28: 4 mg via ORAL
  Filled 2015-09-26: qty 1

## 2015-09-26 MED ORDER — CLONIDINE HCL 0.2 MG PO TABS
0.2000 mg | ORAL_TABLET | Freq: Two times a day (BID) | ORAL | Status: DC
  Administered 2015-09-27 – 2015-09-30 (×8): 0.2 mg via ORAL
  Filled 2015-09-26 (×8): qty 1

## 2015-09-26 MED ORDER — INSULIN ASPART 100 UNIT/ML ~~LOC~~ SOLN
0.0000 [IU] | Freq: Three times a day (TID) | SUBCUTANEOUS | Status: DC
Start: 2015-09-27 — End: 2015-09-30
  Administered 2015-09-27: 1 [IU] via SUBCUTANEOUS

## 2015-09-26 MED ORDER — POLYETHYLENE GLYCOL 3350 17 G PO PACK
17.0000 g | PACK | Freq: Every day | ORAL | Status: DC | PRN
  Administered 2015-09-27: 17 g via ORAL
  Filled 2015-09-26: qty 1

## 2015-09-26 MED ORDER — VANCOMYCIN HCL IN DEXTROSE 1-5 GM/200ML-% IV SOLN
1000.0000 mg | Freq: Once | INTRAVENOUS | Status: AC
  Administered 2015-09-26: 1000 mg via INTRAVENOUS
  Filled 2015-09-26: qty 200

## 2015-09-26 MED ORDER — MORPHINE SULFATE (PF) 2 MG/ML IV SOLN: 2.0000 mg | INTRAVENOUS | Status: DC | PRN

## 2015-09-26 MED ORDER — ACETAMINOPHEN 500 MG PO TABS
1000.0000 mg | ORAL_TABLET | Freq: Every day | ORAL | Status: DC | PRN
  Administered 2015-09-28 (×2): 1000 mg via ORAL
  Filled 2015-09-26 (×2): qty 2

## 2015-09-26 MED ORDER — IRBESARTAN 150 MG PO TABS
150.0000 mg | ORAL_TABLET | Freq: Every day | ORAL | Status: DC
  Administered 2015-09-27 – 2015-09-30 (×4): 150 mg via ORAL
  Filled 2015-09-26 (×4): qty 1

## 2015-09-26 MED ORDER — PANTOPRAZOLE SODIUM 40 MG PO TBEC
40.0000 mg | DELAYED_RELEASE_TABLET | Freq: Every day | ORAL | Status: DC
  Administered 2015-09-27 – 2015-09-30 (×4): 40 mg via ORAL
  Filled 2015-09-26 (×4): qty 1

## 2015-09-26 MED ORDER — ENOXAPARIN SODIUM 40 MG/0.4ML ~~LOC~~ SOLN
40.0000 mg | SUBCUTANEOUS | Status: DC
  Administered 2015-09-27: 40 mg via SUBCUTANEOUS
  Filled 2015-09-26: qty 0.4

## 2015-09-26 MED ORDER — ATENOLOL 25 MG PO TABS
100.0000 mg | ORAL_TABLET | Freq: Every day | ORAL | Status: DC
  Administered 2015-09-27 – 2015-09-30 (×4): 100 mg via ORAL
  Filled 2015-09-26 (×4): qty 4

## 2015-09-26 MED ORDER — PIPERACILLIN-TAZOBACTAM 3.375 G IVPB 30 MIN
3.3750 g | Freq: Once | INTRAVENOUS | Status: AC
  Administered 2015-09-26: 3.375 g via INTRAVENOUS
  Filled 2015-09-26: qty 50

## 2015-09-26 MED ORDER — GABAPENTIN 300 MG PO CAPS: 300.0000 mg | ORAL_CAPSULE | Freq: Every day | ORAL | Status: DC | PRN

## 2015-09-26 MED ORDER — ONDANSETRON HCL 4 MG/2ML IJ SOLN
4.0000 mg | Freq: Once | INTRAMUSCULAR | Status: AC
  Administered 2015-09-26: 4 mg via INTRAVENOUS
  Filled 2015-09-26: qty 2

## 2015-09-26 MED ORDER — SODIUM CHLORIDE 0.9 % IV BOLUS (SEPSIS)
1000.0000 mL | Freq: Once | INTRAVENOUS | Status: AC
  Administered 2015-09-26: 1000 mL via INTRAVENOUS

## 2015-09-26 NOTE — ED Notes (Signed)
PT states he was abdominal pain, nausea and decreased appetite and can only tolerate liquids since his endo procedure over a month ago.

## 2015-09-26 NOTE — H&P (Addendum)
PCP:   Chevis Pretty, FNP   Chief Complaint:  Abdominal pain  HPI:  65 year old male who  has a past medical history of Diverticulosis (03/04/10); Peripheral vascular disease, unspecified ([place]); Undiagnosed cardiac murmurs; Other symptoms involving cardiovascular system; HLD (hyperlipidemia); HTN (hypertension); Type II or unspecified type diabetes mellitus without mention of complication, not stated as uncontrolled; Ulcer; Carotid artery occlusion; Umbilical hernia (now); Blood transfusion; AVM (arteriovenous malformation) of colon with hemorrhage; Gastritis; Internal hemorrhoids; Right carotid bruit; Shortness of breath; GERD (gastroesophageal reflux disease); Chronic kidney disease; Arthritis; Anemia; Tobacco use disorder; Anxiety; CHF (congestive heart failure) ([place]); Irregular heart beat; Iron deficiency anemia due to chronic blood loss (10/07/2010); and Chronic renal disease, stage 5, glomerular filtration rate less than or equal to 15 mL/min/1.73 square meter ([place]) (10/07/2010). Today presents to the hospital with epigastric pain. Patient also has been complaining of difficulty swallowing, for past 2 months. Patient underwent EGD in February this year which showed nonerosive gastritis and duodenal AVMs. He has also been following cancer Center for getting IV iron and blood transfusion for anemia from AVMs. CT abdomen pelvis done in the ED showed possibility of right lower lobe pneumonia as well as changes consistent with lymphadenopathy in the region of right hilum and adjacent to the right pulmonary artery. CT of the chest was recommended. Patient underwent CT chest which showed extensive malignancy in the thorax. Large right hilar/mediastinal masslike soft tissue density causing encasement and narrowing of the right mainstem bronchus and bronchus intermedius. Also question of pneumonia so patient started on vancomycin and Zosyn in the ED for possible pneumonia. Patient has been coughing  up phlegm, denies fever or chills. No dysuria. No vomiting or diarrhea. Does complain of retching along with abdominal pain. Patient has been complaining of shortness of breath. In the ED was found to be hypoxic requiring oxygen. O2 sats above 92% on 2 L oxygen via nasal cannula. Allergies:   Allergies  Allergen Reactions  . Zocor [Simvastatin] Palpitations    Chest soreness  . Asa [Aspirin] Other (See Comments)    GI Bleed  . Doxycycline Diarrhea  . Hctz [Hydrochlorothiazide]     Leg cramps  . Iodine Hives    IVP dye  . Sulfa Antibiotics Other (See Comments)    Unknown       Past Medical History  Diagnosis Date  . Diverticulosis 03/04/10  . Peripheral vascular disease, unspecified ([place])   . Undiagnosed cardiac murmurs   . Other symptoms involving cardiovascular system   . HLD (hyperlipidemia)   . HTN (hypertension)   . Type II or unspecified type diabetes mellitus without mention of complication, not stated as uncontrolled   . Ulcer   . Carotid artery occlusion   . Umbilical hernia now    has not been repaired  . Blood transfusion   . AVM (arteriovenous malformation) of colon with hemorrhage   . Gastritis   . Internal hemorrhoids   . Right carotid bruit   . Shortness of breath     noted /w low Hgb  . GERD (gastroesophageal reflux disease)   . Chronic kidney disease     renal failure /w osteomyelitis- 2010  . Arthritis     back  . Anemia     8/3,4,5- blood transfusion- [facility], colonoscopy- done & found 2 areas of bleeding   . Tobacco use disorder   . Anxiety   . CHF (congestive heart failure) ([place])     3 yrs ago  . Irregular heart  beat   . Iron deficiency anemia due to chronic blood loss 10/07/2010  . Chronic renal disease, stage 5, glomerular filtration rate less than or equal to 15 mL/min/1.73 square meter ([place]) 10/07/2010    Past Surgical History  Procedure Laterality Date  . Colonoscopy  03/04/2010    Dr. Princella Pellegrini AMV without mention of ablation,  scattered diverticula, internal hemorrhoids  . Bilateral common superficial femoral and profudus artery endarterectomies      2003    Dr. Sherren Mocha Early  . Esophagogastroduodenoscopy  03/2000    Dr. Tharon Aquas gastritis, H.Pylori gastritis, ?treated  . Esophagogastroduodenoscopy  05/2006    Dr. Marcello Fennel recurrent GI bleed. antral gastritis, single gastric AVM s/p APC, CLO results?  . Colonoscopy  11/2004    Dr. Barkley Bruns  . 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 Both of these are ablated with argon plasma coagulator./ Small external hemorrhoids  . Esophagogastroduodenoscopy  01/21/2012    SLF: MILD TO MAODERATE Gastritis/ SLOW GIB LIKEY DUE TO PT BEING ON ASA ND SUBSEQUENT BLOOD/ LOSS FROM AVMS IN STOMACH AND COLON AS WELL AS GASTRITIS  . Enteroscopy  01/21/2012    Procedure: ENTEROSCOPY;  Surgeon: Danie Binder, MD;  [facility];  Service: Endoscopy;;  . No past surgeries    . Vascular surgery      rt bka and rt 1st toe amputation  . Below knee leg amputation      2010, wears prosthesis   . Endarterectomy  02/09/2012    Procedure: ENDARTERECTOMY CAROTID;  Surgeon: Rosetta Posner, MD;  Location: [facility];  Service: Vascular;  Laterality: Left;  . Carotid endarterectomy Left 02-09-12  . Esophagogastroduodenoscopy N/A 09/03/2013    Mild chronic gastritis. benign gastric polyps  . Givens capsule study N/A 09/03/2013    Dr. Oneida Alar: small bowel and colonic AVMs. no active bleeding  . Esophagogastroduodenoscopy N/A 08/19/2015    Dr. Oneida Alar: 2 large gastric AVMs in fundus, 1 actively bleeding s/p APC and clip placement, empiric Savary dilation  . Savory dilation N/A 08/19/2015    Procedure: SAVORY DILATION;  Surgeon: Danie Binder, MD;  Location: [facility];  Service: Endoscopy;  Laterality: N/A;    Prior to Admission medications    Medication Sig Start Date End Date Taking? Authorizing Provider  acetaminophen (TYLENOL) 500 MG tablet Take 1,000 mg by mouth daily as needed for mild pain or headache.   Yes Historical Provider, MD  amLODipine (NORVASC) 10 MG tablet TAKE 1 TABLET BY MOUTH DAILY, BEFORE BREAKFAST 05/06/15  Yes Sharion Balloon, FNP  atenolol (TENORMIN) 100 MG tablet TAKE 1 TABLET (100 MG TOTAL) BY MOUTH DAILY. Patient taking differently: TAKE 1/2 TABLET (50 MG TOTAL) BY MOUTH TWICE  DAILY. 09/24/15  Yes [name] Done, FNP  Calcium Carbonate Antacid (TUMS ULTRA 1000 PO) Take 2 tablets by mouth daily as needed (indigestion).    Yes Historical Provider, MD  cloNIDine (CATAPRES) 0.2 MG tablet Take 1 tablet (0.2 mg total) by mouth 2 (two) times daily. 07/11/15  Yes [name] Done, FNP  furosemide (LASIX) 40 MG tablet TAKE 1 TABLET (40 MG TOTAL) BY MOUTH AS NEEDED. Patient taking differently: take '40mg'$ s 3 times daily as needed for swelling 03/20/14  Yes [name] Done, FNP  gabapentin (NEURONTIN) 300 MG capsule Take 1 capsule (300 mg total) by mouth 3 (three) times  daily. Patient taking differently: Take 300 mg by mouth daily as needed (nerve pain).  03/22/14  Yes Sharion Balloon, FNP  glipiZIDE (GLUCOTROL) 10 MG tablet Take 0.5 tablets (5 mg total) by mouth daily before breakfast. Takes one tablet in the Am and 1/2 tablet in the pm Patient taking differently: Take 5 mg by mouth daily as needed (high blood sugar).  09/02/15  Yes Kathie Dike, MD  ondansetron (ZOFRAN) 4 MG tablet Take 1 tablet (4 mg total) by mouth 4 (four) times daily -  before meals and at bedtime. 08/15/15  Yes Orvil Feil, NP  pantoprazole (PROTONIX) 40 MG tablet TAKE 1 TABLET BY MOUTH TWICE A DAY BEFORE MEAL 06/19/15  Yes Sharion Balloon, FNP  polyethylene glycol (MIRALAX / GLYCOLAX) packet Take 17 g by mouth daily as needed for mild constipation (*tAKES [place]).    Yes Historical Provider, MD  silver sulfADIAZINE  (SILVADENE) 1 % cream APPLY TO AFFECTED AREA TWICE A DAY Patient taking differently: Apply 1 application topically 2 (two) times daily as needed (sore skin).  03/14/14  Yes [name] Done, FNP  valsartan (DIOVAN) 160 MG tablet Take 1 tablet (160 mg total) by mouth daily. 09/18/15  Yes [name] Done, FNP  Vitamin D, Ergocalciferol, (DRISDOL) 50000 units CAPS capsule Take 50,000 Units by mouth once a week. Takes on Saturdays. 08/10/15  Yes Historical Provider, MD  doxycycline (VIBRA-TABS) 100 MG tablet Take 1 tablet (100 mg total) by mouth 2 (two) times daily. 1 po bid Patient not taking: Reported on 09/26/2015 09/18/15   [name] Done, FNP  HYDROcodone-acetaminophen (NORCO/VICODIN) 5-325 MG tablet Take 1 tablet by mouth every 8 (eight) hours as needed. Patient not taking: Reported on 09/26/2015 05/06/15   Sharion Balloon, FNP    Social History:  reports that he has been smoking Cigarettes.  He has a 100 pack-year smoking history. He quit smokeless tobacco use about 3 years ago. He reports that he does not drink alcohol or use illicit drugs.  Family History  Problem Relation Age of Onset  . Colon cancer Brother 65    deceased  . Hyperlipidemia Brother   . Hypertension Brother   . Lung cancer Sister     deceased, three primary cancers: lung, esophageal, lymphoma.   . Diabetes Sister   . Hypertension Sister   . Cancer Father     gallbladder  . Hyperlipidemia Father   . Hypertension Father   . Cancer Brother     unknown primary  . Cancer Sister     unknown primary  . Hyperlipidemia Sister   . Diabetes Mother   . Hypertension Mother     Danley Danker Weights   09/26/15 1727  Weight: 68.947 kg (152 lb)    All the positives are listed in BOLD  Review of Systems:  HEENT: Headache, blurred vision, runny nose, sore throat Neck: Hypothyroidism, hyperthyroidism,,lymphadenopathy Chest : Shortness of breath, history of COPD, Asthma Heart : Chest pain, history of coronary arterey  disease GI:  Nausea, vomiting, diarrhea, constipation, GERD GU: Dysuria, urgency, frequency of urination, hematuria Neuro: Stroke, seizures, syncope Psych: Depression, anxiety, hallucinations   Physical Exam: Blood pressure 153/62, pulse 93, temperature 98.8 F (37.1 C), temperature source Oral, resp. rate 17, height '5\' 4"'$  (1.626 m), weight 68.947 kg (152 lb), SpO2 96 %. Constitutional:   Patient is a well-developed and well-nourished male in no acute distress and cooperative with exam. Head: Normocephalic and atraumatic Mouth: Mucus membranes moist Eyes: PERRL, EOMI, conjunctivae  normal Neck: Supple, No Thyromegaly Cardiovascular: RRR, S1 normal, S2 normal Pulmonary/Chest: CTAB, no wheezes, rales, or rhonchi Abdominal: Soft. Non-tender, non-distended, bowel sounds are normal, no masses, organomegaly, or guarding present.  Neurological: A&O x3, Strength is normal and symmetric bilaterally, cranial nerve II-XII are grossly intact, no focal motor deficit, sensory intact to light touch bilaterally.  Extremities : status post right below knee amputation  Labs on Admission:  Basic Metabolic Panel:  Recent Labs Lab 09/26/15 1731  NA 130*  K 5.1  CL 94*  CO2 27  GLUCOSE 158*  BUN 77*  CREATININE 4.05*  CALCIUM 8.4*   Liver Function Tests:  Recent Labs Lab 09/26/15 1731  AST 23  ALT 18  ALKPHOS 97  BILITOT 0.4  PROT 6.9  ALBUMIN 3.4*    Recent Labs Lab 09/26/15 1731  LIPASE 91*  CBC:  Recent Labs Lab 09/26/15 1731  WBC 9.5  HGB 11.0*  HCT 32.8*  MCV 91.1  PLT 262    CBG: No results for input(s): GLUCAP in the last 168 hours.  Radiological Exams on Admission: Ct Abdomen Pelvis Wo Contrast  09/26/2015  CLINICAL DATA:  Abdominal pain with decreased appetite following esophageal dilatation procedure EXAM: CT ABDOMEN AND PELVIS WITHOUT CONTRAST TECHNIQUE: Multidetector CT imaging of the abdomen and pelvis was performed following the standard protocol without IV  contrast. COMPARISON:  None. FINDINGS: Lung bases demonstrate evidence of right lower lobe pneumonia. Some fullness is also noted anterior to the right main pulmonary artery which likely represents lymphadenopathy. Dedicated CT of the chest is recommended. The liver, gallbladder, spleen, adrenal glands and pancreas are all normal in their CT appearance. The kidneys are well visualized bilaterally and demonstrate renal vascular calcifications. No renal stones or obstructive changes are seen. The bladder is partially distended. Significant calcifications of the seminal vesicles are noted bilaterally. The vas deferens are also calcified. Aortoiliac calcifications are seen. A small umbilical hernia is noted containing fat. The appendix is within normal limits. No significant diverticular change is noted. Stomach demonstrates clips consistent with the prior endoscopy procedure. IMPRESSION: Right lower lobe pneumonia as well as changes consistent with lymphadenopathy in the region of the right hilum and adjacent to the right pulmonary artery. CT of the chest is recommended for further evaluation. Chronic changes in the abdomen and pelvis. No acute abnormality is seen. Electronically Signed   By: Inez Catalina M.D.   On: 09/26/2015 20:29   Ct Chest Wo Contrast  09/26/2015  CLINICAL DATA:  Shortness of breath. CT abdomen today demonstrating right lower lobe pneumonia and right hilar adenopathy. EXAM: CT CHEST WITHOUT CONTRAST TECHNIQUE: Multidetector CT imaging of the chest was performed following the standard protocol without IV contrast. COMPARISON:  Included lung bases from CT abdomen earlier today. FINDINGS: Mediastinum/Lymph Nodes: Extensive mediastinal adenopathy and masslike opacity in the right hilar and right mediastinum, with marked paratracheal thickening and circumferential soft tissue density posterior lateral to the mid distal trachea, encasing the trachea at the carina, and causing luminal narrowing of the  right mainstem bronchus. Exact measurements are difficult due to the large nature, however measures at least 9.5 cm in greatest dimension. Individual lymph nodes versus pulmonary mass cannot be discretely identified given lack contrast in the conglomerate soft tissue nature. Separate enlarged AP window lymph node measures 1.5 cm short axis with additional superior mediastinal, prevascular, and AP window nodes. There is abnormal left axillary adenopathy. Multiple small right axillary lymph nodes also seen. Mild cardiomegaly. Moderate coronary artery calcifications.  Atherosclerosis of the thoracic aorta which is normal in caliber. Physiologic pericardial fluid. Lungs/Pleura: Abnormal right hilar soft tissue density with spiculation, a discrete right lung nodule or mass is not seen due to the conglomerate nature and lack of intravenous contrast. There luminal narrowing and near complete occlusion of the right mainstem bronchus secondary to bronchial encasement. Mass encases the bronchus intermedius with marked narrowing of the middle and lower lobe bronchi. Volume loss in the right lung with small right pleural effusion. Ill-defined and nodular right lower lobe opaciteis. 2 spiculated nodules in the left upper lobe. More superior nodule measures 2.4 x 1.8 x 2.2 cm with peripheral cavitation. The more inferior nodule measures 2.1 x 1.6 x 2.0 cm. Both nodules have spiculated borders. Upper abdomen: Evaluated earlier this day at with CT abdomen/pelvis. Musculoskeletal: No destructive lytic or discrete blastic osseous lesion. 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 IMPRESSION: 1. 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 Large right hilar/mediastinal masslike soft tissue density causing encasement and narrowing of the right mainstem bronchus and bronchus intermedius. Discrete mass difficult separate from the extensive multifocal mediastinal adenopathy. The right lower lobe opacities may  be postobstructive atelectasis, spread of malignancy or pneumonia. Suspect bronchogenic malignancy, however primary lesion difficult to discern. 2. Two spiculated left upper lobe nodules, both approximately 2 cm, primary versus metastatic disease. These results were called by telephone at the time of interpretation on 09/26/2015 at 10:19 pm to Dr. Milton Ferguson , who verbally acknowledged these results. Electronically Signed   By: Jeb Levering M.D.   On: 09/26/2015 22:20      Assessment/Plan Active Problems:   TOBACCO USER   Chronic renal disease, stage 5, glomerular filtration rate less than or equal to 15 mL/min/1.73 square meter ([facility])   Amputee, below knee ([facility])   Hyponatremia   Dysphagia   Lung mass   Pneumonia   ? Postobstructive pneumonia Patient presenting with possible post obstructive pneumonia. Received vancomycin and Zosyn in the ED Will start Levaquin per pharmacy consultation Follow blood cultures.  Lung mass CT chest shows extensive malignancy in the thorax. Will need biopsy of the mass. Consider discussing with IR in a.m. versus bronchoscopy  Diabetes mellitus Hold Glucotrol, start sliding scale insulin with NovoLog.  C KD stage V Patient's baseline creatinine around 4. Today creatinine is 4.05  Hyponatremia Sodium is 130. Will check serum osmolality. Follow BMP in a.m.  Dysphagia Likely from thoracic mass, will start full liquid diet.  DVT prophylaxis Lovenox    Code status: Full code  Family discussion: Admission, patients condition and plan of care including tests being ordered have been discussed with the patient and  his wife at bedside who indicate understanding and agree with the plan and Code Status.   Time Spent on Admission: 60 min  [facility] Pager: [phone] 09/26/2015, 11:26 PM  If 7PM-7AM, please contact night-coverage  [email or web address]  Password [email or web address] Addendum: reexamined today. No change in exam. meds reviewed.  Questions answered

## 2015-09-26 NOTE — ED Provider Notes (Signed)
CSN: [id number]     Arrival date & time 09/26/15  1707 History   First MD Initiated Contact with Patient 09/26/15 1841     Chief Complaint  Patient presents with  . Abdominal Pain     (Consider location/radiation/quality/duration/timing/severity/associated sxs/prior Treatment) Patient is a 65 y.o. male presenting with abdominal pain. The history is provided by the patient (Patient complains of upper epigastric abdominal pain and some difficulty swallowing).  Abdominal Pain Pain location:  Generalized Pain quality: aching   Pain radiates to:  Does not radiate Pain severity:  Moderate Onset quality:  Sudden Timing:  Constant Associated symptoms: no chest pain, no cough, no diarrhea, no fatigue and no hematuria     Past Medical History  Diagnosis Date  . Diverticulosis 03/04/10  . Peripheral vascular disease, unspecified ([place])   . Undiagnosed cardiac murmurs   . Other symptoms involving cardiovascular system   . HLD (hyperlipidemia)   . HTN (hypertension)   . Type II or unspecified type diabetes mellitus without mention of complication, not stated as uncontrolled   . Ulcer   . Carotid artery occlusion   . Umbilical hernia now    has not been repaired  . Blood transfusion   . AVM (arteriovenous malformation) of colon with hemorrhage   . Gastritis   . Internal hemorrhoids   . Right carotid bruit   . Shortness of breath     noted /w low Hgb  . GERD (gastroesophageal reflux disease)   . Chronic kidney disease     renal failure /w osteomyelitis- 2010  . Arthritis     back  . Anemia     8/3,4,5- blood transfusion- [facility], colonoscopy- done & found 2 areas of bleeding   . Tobacco use disorder   . Anxiety   . CHF (congestive heart failure) ([place])     3 yrs ago  . Irregular heart beat   . Iron deficiency anemia due to chronic blood loss 10/07/2010  . Chronic renal disease, stage 5, glomerular filtration rate less than or equal to 15 mL/min/1.73 square meter ([place]) 10/07/2010   Past  Surgical History  Procedure Laterality Date  . Colonoscopy  03/04/2010    Dr. Princella Pellegrini AMV without mention of ablation, scattered diverticula, internal hemorrhoids  . Bilateral common superficial femoral and profudus artery endarterectomies      2003    Dr. Sherren Mocha Early  . Esophagogastroduodenoscopy  03/2000    Dr. Tharon Aquas gastritis, H.Pylori gastritis, ?treated  . Esophagogastroduodenoscopy  05/2006    Dr. Marcello Fennel recurrent GI bleed. antral gastritis, single gastric AVM s/p APC, CLO results?  . Colonoscopy  11/2004    Dr. Barkley Bruns  . 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 Both of these are ablated with argon plasma coagulator./ Small external hemorrhoids  . Esophagogastroduodenoscopy  01/21/2012    SLF: MILD TO MAODERATE Gastritis/ SLOW GIB LIKEY DUE TO PT BEING ON ASA ND SUBSEQUENT BLOOD/ LOSS FROM AVMS IN STOMACH AND COLON AS WELL AS GASTRITIS  . Enteroscopy  01/21/2012    Procedure: ENTEROSCOPY;  Surgeon: Danie Binder, MD;  [facility];  Service: Endoscopy;;  . No past surgeries    . Vascular surgery      rt bka and rt 1st toe amputation  . Below knee leg amputation      2010, wears prosthesis   .  Endarterectomy  02/09/2012    Procedure: ENDARTERECTOMY CAROTID;  Surgeon: Rosetta Posner, MD;  Location: [place];  Service: Vascular;  Laterality: Left;  . Carotid endarterectomy Left 02-09-12  . Esophagogastroduodenoscopy N/A 09/03/2013    Mild chronic gastritis. benign gastric polyps  . Givens capsule study N/A 09/03/2013    Dr. Oneida Alar: small bowel and colonic AVMs. no active bleeding  . Esophagogastroduodenoscopy N/A 08/19/2015    Dr. Oneida Alar: 2 large gastric AVMs in fundus, 1 actively bleeding s/p APC and clip placement, empiric Savary dilation  . Savory dilation N/A 08/19/2015    Procedure: SAVORY DILATION;   Surgeon: Danie Binder, MD;  Location: [facility];  Service: Endoscopy;  Laterality: N/A;   Family History  Problem Relation Age of Onset  . Colon cancer Brother 28    deceased  . Hyperlipidemia Brother   . Hypertension Brother   . Lung cancer Sister     deceased, three primary cancers: lung, esophageal, lymphoma.   . Diabetes Sister   . Hypertension Sister   . Cancer Father     gallbladder  . Hyperlipidemia Father   . Hypertension Father   . Cancer Brother     unknown primary  . Cancer Sister     unknown primary  . Hyperlipidemia Sister   . Diabetes Mother   . Hypertension Mother    Social History  Substance Use Topics  . Smoking status: Current Every Day Smoker -- 2.00 packs/day for 50 years    Types: Cigarettes  . Smokeless tobacco: Former Systems developer    Quit date: 02/08/2012     Comment: Smokes about 2 packs of tiny skinny cigarettes  . Alcohol Use: No    Review of Systems  Constitutional: Negative for appetite change and fatigue.  HENT: Negative for congestion, ear discharge and sinus pressure.   Eyes: Negative for discharge.  Respiratory: Negative for cough.   Cardiovascular: Negative for chest pain.  Gastrointestinal: Positive for abdominal pain. Negative for diarrhea.  Genitourinary: Negative for frequency and hematuria.  Musculoskeletal: Negative for back pain.  Skin: Negative for rash.  Neurological: Negative for seizures and headaches.  Psychiatric/Behavioral: Negative for hallucinations.      Allergies  Zocor; Asa; Doxycycline; Hctz; Iodine; and Sulfa antibiotics  Home Medications   Prior to Admission medications   Medication Sig Start Date End Date Taking? Authorizing Provider  acetaminophen (TYLENOL) 500 MG tablet Take 1,000 mg by mouth daily as needed for mild pain or headache.   Yes Historical Provider, MD  amLODipine (NORVASC) 10 MG tablet TAKE 1 TABLET BY MOUTH DAILY, BEFORE BREAKFAST 05/06/15  Yes Sharion Balloon, FNP  atenolol (TENORMIN)  100 MG tablet TAKE 1 TABLET (100 MG TOTAL) BY MOUTH DAILY. Patient taking differently: TAKE 1/2 TABLET (50 MG TOTAL) BY MOUTH TWICE  DAILY. 09/24/15  Yes [name] Done, FNP  Calcium Carbonate Antacid (TUMS ULTRA 1000 PO) Take 2 tablets by mouth daily as needed (indigestion).    Yes Historical Provider, MD  cloNIDine (CATAPRES) 0.2 MG tablet Take 1 tablet (0.2 mg total) by mouth 2 (two) times daily. 07/11/15  Yes [name] Done, FNP  furosemide (LASIX) 40 MG tablet TAKE 1 TABLET (40 MG TOTAL) BY MOUTH AS NEEDED. Patient taking differently: take '40mg'$ s 3 times daily as needed for swelling 03/20/14  Yes [name] Done, FNP  gabapentin (NEURONTIN) 300 MG capsule Take 1 capsule (300 mg total) by mouth 3 (three) times daily. Patient taking differently: Take 300 mg by mouth daily as needed (nerve  pain).  03/22/14  Yes Sharion Balloon, FNP  glipiZIDE (GLUCOTROL) 10 MG tablet Take 0.5 tablets (5 mg total) by mouth daily before breakfast. Takes one tablet in the Am and 1/2 tablet in the pm Patient taking differently: Take 5 mg by mouth daily as needed (high blood sugar).  09/02/15  Yes Kathie Dike, MD  ondansetron (ZOFRAN) 4 MG tablet Take 1 tablet (4 mg total) by mouth 4 (four) times daily -  before meals and at bedtime. 08/15/15  Yes Orvil Feil, NP  pantoprazole (PROTONIX) 40 MG tablet TAKE 1 TABLET BY MOUTH TWICE A DAY BEFORE MEAL 06/19/15  Yes Sharion Balloon, FNP  polyethylene glycol (MIRALAX / GLYCOLAX) packet Take 17 g by mouth daily as needed for mild constipation (*tAKES [place]).    Yes Historical Provider, MD  silver sulfADIAZINE (SILVADENE) 1 % cream APPLY TO AFFECTED AREA TWICE A DAY Patient taking differently: Apply 1 application topically 2 (two) times daily as needed (sore skin).  03/14/14  Yes [name] Done, FNP  valsartan (DIOVAN) 160 MG tablet Take 1 tablet (160 mg total) by mouth daily. 09/18/15  Yes [name] Done, FNP  Vitamin D, Ergocalciferol,  (DRISDOL) 50000 units CAPS capsule Take 50,000 Units by mouth once a week. Takes on Saturdays. 08/10/15  Yes Historical Provider, MD  doxycycline (VIBRA-TABS) 100 MG tablet Take 1 tablet (100 mg total) by mouth 2 (two) times daily. 1 po bid Patient not taking: Reported on 09/26/2015 09/18/15   [name] Done, FNP  HYDROcodone-acetaminophen (NORCO/VICODIN) 5-325 MG tablet Take 1 tablet by mouth every 8 (eight) hours as needed. Patient not taking: Reported on 09/26/2015 05/06/15   Christy A Hawks, FNP   BP 139/62 mmHg  Pulse 91  Temp(Src) 98.8 F (37.1 C) (Oral)  Resp 17  Ht '5\' 4"'$  (1.626 m)  Wt 152 lb (68.947 kg)  BMI 26.08 kg/m2  SpO2 95% Physical Exam  Constitutional: He is oriented to person, place, and time. He appears well-developed.  HENT:  Head: Normocephalic.  Eyes: Conjunctivae and EOM are normal. No scleral icterus.  Neck: Neck supple. No thyromegaly present.  Cardiovascular: Normal rate and regular rhythm.  Exam reveals no gallop and no friction rub.   No murmur heard. Pulmonary/Chest: No stridor. He has no wheezes. He has no rales. He exhibits no tenderness.  Abdominal: He exhibits no distension. There is tenderness. There is no rebound.  Mild epigastric tenderness  Musculoskeletal: Normal range of motion. He exhibits no edema.  Lymphadenopathy:    He has no cervical adenopathy.  Neurological: He is oriented to person, place, and time. He exhibits normal muscle tone. Coordination normal.  Skin: No rash noted. No erythema.  Psychiatric: He has a normal mood and affect. His behavior is normal.    ED Course  Procedures (including critical care time) Labs Review Labs Reviewed  LIPASE, BLOOD - Abnormal; Notable for the following:    Lipase 91 (*)    All other components within normal limits  COMPREHENSIVE METABOLIC PANEL - Abnormal; Notable for the following:    Sodium 130 (*)    Chloride 94 (*)    Glucose, Bld 158 (*)    BUN 77 (*)    Creatinine, Ser 4.05 (*)     Calcium 8.4 (*)    Albumin 3.4 (*)    GFR calc non Af Amer 14 (*)    GFR calc Af Amer 16 (*)    All other components within normal limits  CBC - Abnormal;  Notable for the following:    RBC 3.60 (*)    Hemoglobin 11.0 (*)    HCT 32.8 (*)    All other components within normal limits  URINALYSIS, ROUTINE W REFLEX MICROSCOPIC (NOT AT [facility]) - Abnormal; Notable for the following:    Glucose, UA 100 (*)    Protein, ur 100 (*)    All other components within normal limits  URINE MICROSCOPIC-ADD ON    Imaging Review Ct Abdomen Pelvis Wo Contrast  09/26/2015  CLINICAL DATA:  Abdominal pain with decreased appetite following esophageal dilatation procedure EXAM: CT ABDOMEN AND PELVIS WITHOUT CONTRAST TECHNIQUE: Multidetector CT imaging of the abdomen and pelvis was performed following the standard protocol without IV contrast. COMPARISON:  None. FINDINGS: Lung bases demonstrate evidence of right lower lobe pneumonia. Some fullness is also noted anterior to the right main pulmonary artery which likely represents lymphadenopathy. Dedicated CT of the chest is recommended. The liver, gallbladder, spleen, adrenal glands and pancreas are all normal in their CT appearance. The kidneys are well visualized bilaterally and demonstrate renal vascular calcifications. No renal stones or obstructive changes are seen. The bladder is partially distended. Significant calcifications of the seminal vesicles are noted bilaterally. The vas deferens are also calcified. Aortoiliac calcifications are seen. A small umbilical hernia is noted containing fat. The appendix is within normal limits. No significant diverticular change is noted. Stomach demonstrates clips consistent with the prior endoscopy procedure. IMPRESSION: Right lower lobe pneumonia as well as changes consistent with lymphadenopathy in the region of the right hilum and adjacent to the right pulmonary artery. CT of the chest is recommended for further evaluation. Chronic  changes in the abdomen and pelvis. No acute abnormality is seen. Electronically Signed   By: Inez Catalina M.D.   On: 09/26/2015 20:29   Ct Chest Wo Contrast  09/26/2015  CLINICAL DATA:  Shortness of breath. CT abdomen today demonstrating right lower lobe pneumonia and right hilar adenopathy. EXAM: CT CHEST WITHOUT CONTRAST TECHNIQUE: Multidetector CT imaging of the chest was performed following the standard protocol without IV contrast. COMPARISON:  Included lung bases from CT abdomen earlier today. FINDINGS: Mediastinum/Lymph Nodes: Extensive mediastinal adenopathy and masslike opacity in the right hilar and right mediastinum, with marked paratracheal thickening and circumferential soft tissue density posterior lateral to the mid distal trachea, encasing the trachea at the carina, and causing luminal narrowing of the right mainstem bronchus. Exact measurements are difficult due to the large nature, however measures at least 9.5 cm in greatest dimension. Individual lymph nodes versus pulmonary mass cannot be discretely identified given lack contrast in the conglomerate soft tissue nature. Separate enlarged AP window lymph node measures 1.5 cm short axis with additional superior mediastinal, prevascular, and AP window nodes. There is abnormal left axillary adenopathy. Multiple small right axillary lymph nodes also seen. Mild cardiomegaly. Moderate coronary artery calcifications. Atherosclerosis of the thoracic aorta which is normal in caliber. Physiologic pericardial fluid. Lungs/Pleura: Abnormal right hilar soft tissue density with spiculation, a discrete right lung nodule or mass is not seen due to the conglomerate nature and lack of intravenous contrast. There luminal narrowing and near complete occlusion of the right mainstem bronchus secondary to bronchial encasement. Mass encases the bronchus intermedius with marked narrowing of the middle and lower lobe bronchi. Volume loss in the right lung with small right  pleural effusion. Ill-defined and nodular right lower lobe opaciteis. 2 spiculated nodules in the left upper lobe. More superior nodule measures 2.4 x 1.8 x 2.2 cm  with peripheral cavitation. The more inferior nodule measures 2.1 x 1.6 x 2.0 cm. Both nodules have spiculated borders. Upper abdomen: Evaluated earlier this day at with CT abdomen/pelvis. Musculoskeletal: No destructive lytic or discrete blastic osseous lesion. 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 IMPRESSION: 1. 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 Large right hilar/mediastinal masslike soft tissue density causing encasement and narrowing of the right mainstem bronchus and bronchus intermedius. Discrete mass difficult separate from the extensive multifocal mediastinal adenopathy. The right lower lobe opacities may be postobstructive atelectasis, spread of malignancy or pneumonia. Suspect bronchogenic malignancy, however primary lesion difficult to discern. 2. Two spiculated left upper lobe nodules, both approximately 2 cm, primary versus metastatic disease. These results were called by telephone at the time of interpretation on 09/26/2015 at 10:19 pm to Dr. Milton Ferguson , who verbally acknowledged these results. Electronically Signed   By: Jeb Levering M.D.   On: 09/26/2015 22:20   I have personally reviewed and evaluated these images and lab results as part of my medical decision-making.   EKG Interpretation None      MDM   Final diagnoses:  Lung mass    CT of the chest lymphadenopathy masses in chest. Patient will be admitted for cancer workup and also hypoxia    Milton Ferguson, MD 09/26/15 2250

## 2015-09-27 DIAGNOSIS — N185 Chronic kidney disease, stage 5: Secondary | ICD-10-CM | POA: Diagnosis not present

## 2015-09-27 DIAGNOSIS — Z89511 Acquired absence of right leg below knee: Secondary | ICD-10-CM

## 2015-09-27 DIAGNOSIS — R918 Other nonspecific abnormal finding of lung field: Secondary | ICD-10-CM | POA: Diagnosis not present

## 2015-09-27 DIAGNOSIS — J449 Chronic obstructive pulmonary disease, unspecified: Secondary | ICD-10-CM | POA: Diagnosis not present

## 2015-09-27 DIAGNOSIS — J189 Pneumonia, unspecified organism: Secondary | ICD-10-CM

## 2015-09-27 DIAGNOSIS — R1310 Dysphagia, unspecified: Secondary | ICD-10-CM | POA: Diagnosis not present

## 2015-09-27 DIAGNOSIS — E871 Hypo-osmolality and hyponatremia: Secondary | ICD-10-CM | POA: Diagnosis not present

## 2015-09-27 LAB — CBC
HCT: 30.3 % — ABNORMAL LOW (ref 39.0–52.0)
HEMOGLOBIN: 10.2 g/dL — AB (ref 13.0–17.0)
MCH: 30.8 pg (ref 26.0–34.0)
MCHC: 33.7 g/dL (ref 30.0–36.0)
MCV: 91.5 fL (ref 78.0–100.0)
Platelets: 237 10*3/uL (ref 150–400)
RBC: 3.31 MIL/uL — ABNORMAL LOW (ref 4.22–5.81)
RDW: 15.1 % (ref 11.5–15.5)
WBC: 11.4 10*3/uL — ABNORMAL HIGH (ref 4.0–10.5)

## 2015-09-27 LAB — COMPREHENSIVE METABOLIC PANEL
ALK PHOS: 81 U/L (ref 38–126)
ALT: 17 U/L (ref 17–63)
ANION GAP: 8 (ref 5–15)
AST: 20 U/L (ref 15–41)
Albumin: 3.3 g/dL — ABNORMAL LOW (ref 3.5–5.0)
BILIRUBIN TOTAL: 0.6 mg/dL (ref 0.3–1.2)
BUN: 73 mg/dL — ABNORMAL HIGH (ref 6–20)
CALCIUM: 8.4 mg/dL — AB (ref 8.9–10.3)
CO2: 26 mmol/L (ref 22–32)
Chloride: 98 mmol/L — ABNORMAL LOW (ref 101–111)
Creatinine, Ser: 4.09 mg/dL — ABNORMAL HIGH (ref 0.61–1.24)
GFR, EST AFRICAN AMERICAN: 16 mL/min — AB (ref >60)
GFR, EST NON AFRICAN AMERICAN: 14 mL/min — AB (ref >60)
Glucose, Bld: 148 mg/dL — ABNORMAL HIGH (ref 65–99)
Potassium: 4.8 mmol/L (ref 3.5–5.1)
SODIUM: 132 mmol/L — AB (ref 135–145)
TOTAL PROTEIN: 6.9 g/dL (ref 6.5–8.1)

## 2015-09-27 LAB — GLUCOSE, CAPILLARY
GLUCOSE-CAPILLARY: 142 mg/dL — AB (ref 65–99)
GLUCOSE-CAPILLARY: 157 mg/dL — AB (ref 65–99)
Glucose-Capillary: 162 mg/dL — ABNORMAL HIGH (ref 65–99)
Glucose-Capillary: 164 mg/dL — ABNORMAL HIGH (ref 65–99)

## 2015-09-27 LAB — OSMOLALITY: Osmolality: 307 mOsm/kg — ABNORMAL HIGH (ref 275–295)

## 2015-09-27 MED ORDER — POLYETHYLENE GLYCOL 3350 17 G PO PACK
17.0000 g | PACK | Freq: Two times a day (BID) | ORAL | Status: DC
  Administered 2015-09-27 – 2015-09-28 (×4): 17 g via ORAL
  Filled 2015-09-27 (×5): qty 1

## 2015-09-27 MED ORDER — TRAZODONE HCL 50 MG PO TABS
50.0000 mg | ORAL_TABLET | Freq: Every day | ORAL | Status: DC
  Administered 2015-09-27 – 2015-09-29 (×3): 50 mg via ORAL
  Filled 2015-09-27 (×3): qty 1

## 2015-09-27 MED ORDER — NICOTINE 21 MG/24HR TD PT24
21.0000 mg | MEDICATED_PATCH | Freq: Every day | TRANSDERMAL | Status: DC
  Administered 2015-09-27 – 2015-09-29 (×3): 21 mg via TRANSDERMAL
  Filled 2015-09-27 (×4): qty 1

## 2015-09-27 MED ORDER — LEVOFLOXACIN IN D5W 750 MG/150ML IV SOLN
750.0000 mg | Freq: Once | INTRAVENOUS | Status: AC
  Administered 2015-09-27: 750 mg via INTRAVENOUS
  Filled 2015-09-27: qty 150

## 2015-09-27 MED ORDER — LEVOFLOXACIN IN D5W 500 MG/100ML IV SOLN: 500.0000 mg | INTRAVENOUS | Status: DC

## 2015-09-27 MED ORDER — BISACODYL 10 MG RE SUPP
10.0000 mg | Freq: Once | RECTAL | Status: AC
  Administered 2015-09-27: 10 mg via RECTAL
  Filled 2015-09-27: qty 1

## 2015-09-27 MED ORDER — ENOXAPARIN SODIUM 30 MG/0.3ML ~~LOC~~ SOLN
30.0000 mg | SUBCUTANEOUS | Status: DC
  Administered 2015-09-28 – 2015-09-29 (×2): 30 mg via SUBCUTANEOUS
  Filled 2015-09-27 (×3): qty 0.3

## 2015-09-27 NOTE — Progress Notes (Addendum)
[facility] PROGRESS NOTE  [name] XBL:[id number] DOB: [birth year] DOA: 09/26/2015 PCP: Chevis Pretty, FNP  Assessment/Plan: Large lung mass -Likely represents lung cancer. -Has been seen by Dr. Luan Pulling was planning for bronchoscopy early next week to hopefully achieve diagnosis. -Will need follow-up with oncology once tissue diagnosis is obtained.  Postobstructive pneumonia -Agree with continuation of Levaquin. -We'll treat for 8 days. Next an-culture data pending.  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  Dysphagia -Likely from thoracic mass, continue diet as tolerated (can downgrade if patient unable to tolerate solid diet) and request speech therapy evaluation.  Hyponatremia -Sodium remains low at 130 to 132. Suspect this is related to lung cancer.  Code Status: Full code Family Communication: Wife at bedside updated on plan of care and all questions answered  Disposition Plan: To be determined   Consultants:  Pulmonology, Dr. Luan Pulling   Antibiotics:  Levaquin   Subjective: Tired, fatigued, appears depressed with flat affect  Objective: Filed Vitals:   09/26/15 2111 09/26/15 2230 09/27/15 0015 09/27/15 0926  BP: 139/62 153/62 169/84 134/72  Pulse: 91 93 92 89  Temp:   97.7 F (36.5 C)   TempSrc:   Oral   Resp: 17     Height:   '5\' 4"'$  (1.626 m)   Weight:   68.947 kg (152 lb)   SpO2: 95% 96% 97%    No intake or output data in the 24 hours ending 09/27/15 1103 Filed Weights   09/26/15 1727 09/27/15 0015  Weight: 68.947 kg (152 lb) 68.947 kg (152 lb)    Exam:   General:  Alert, awake, oriented 3  Cardiovascular: Regular rate and rhythm  Respiratory: Clear to auscultation bilaterally  Abdomen: Soft, nontender, nondistended, positive bowel sounds  Extremities: No clubbing, cyanosis or edema, positive pulses ; 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  Neurologic:   Grossly intact and nonfocal  Data Reviewed: Basic Metabolic Panel:  Recent Labs Lab 09/26/15 1731 09/27/15 0731  NA 130* 132*  K 5.1 4.8  CL 94* 98*  CO2 27 26  GLUCOSE 158* 148*  BUN 77* 73*  CREATININE 4.05* 4.09*  CALCIUM 8.4* 8.4*   Liver Function Tests:  Recent Labs Lab 09/26/15 1731 09/27/15 0731  AST 23 20  ALT 18 17  ALKPHOS 97 81  BILITOT 0.4 0.6  PROT 6.9 6.9  ALBUMIN 3.4* 3.3*    Recent Labs Lab 09/26/15 1731  LIPASE 91*   No results for input(s): AMMONIA in the last 168 hours. CBC:  Recent Labs Lab 09/26/15 1731 09/27/15 0731  WBC 9.5 11.4*  HGB 11.0* 10.2*  HCT 32.8* 30.3*  MCV 91.1 91.5  PLT 262 237   Cardiac Enzymes: No results for input(s): CKTOTAL, CKMB, CKMBINDEX, TROPONINI in the last 168 hours. BNP (last 3 results) No results for input(s): BNP in the last 8760 hours.  ProBNP (last 3 results) No results for input(s): PROBNP in the last 8760 hours.  CBG:  Recent Labs Lab 09/27/15 0743  GLUCAP 142*    Recent Results (from the past 240 hour(s))  Culture, blood (Routine X 2) w Reflex to ID Panel     Status: None (Preliminary result)   Collection Time: 09/27/15  1:09 AM  Result Value Ref Range Status   Specimen Description RIGHT ANTECUBITAL  Final   Special Requests BOTTLES DRAWN AEROBIC ONLY [facility]  Final   Culture PENDING  Incomplete  Report Status PENDING  Incomplete  Culture, blood (Routine X 2) w Reflex to ID Panel     Status: None (Preliminary result)   Collection Time: 09/27/15  1:27 AM  Result Value Ref Range Status   Specimen Description BLOOD RIGHT ARM  Final   Special Requests BOTTLES DRAWN AEROBIC ONLY 6CC  Final   Culture PENDING  Incomplete   Report Status PENDING  Incomplete     Studies: Ct Abdomen Pelvis Wo Contrast  09/26/2015  CLINICAL DATA:  Abdominal pain with decreased appetite following esophageal dilatation procedure EXAM: CT ABDOMEN AND PELVIS WITHOUT CONTRAST TECHNIQUE: Multidetector CT imaging of  the abdomen and pelvis was performed following the standard protocol without IV contrast. COMPARISON:  None. FINDINGS: Lung bases demonstrate evidence of right lower lobe pneumonia. Some fullness is also noted anterior to the right main pulmonary artery which likely represents lymphadenopathy. Dedicated CT of the chest is recommended. The liver, gallbladder, spleen, adrenal glands and pancreas are all normal in their CT appearance. The kidneys are well visualized bilaterally and demonstrate renal vascular calcifications. No renal stones or obstructive changes are seen. The bladder is partially distended. Significant calcifications of the seminal vesicles are noted bilaterally. The vas deferens are also calcified. Aortoiliac calcifications are seen. A small umbilical hernia is noted containing fat. The appendix is within normal limits. No significant diverticular change is noted. Stomach demonstrates clips consistent with the prior endoscopy procedure. IMPRESSION: Right lower lobe pneumonia as well as changes consistent with lymphadenopathy in the region of the right hilum and adjacent to the right pulmonary artery. CT of the chest is recommended for further evaluation. Chronic changes in the abdomen and pelvis. No acute abnormality is seen. Electronically Signed   By: Inez Catalina M.D.   On: 09/26/2015 20:29   Ct Chest Wo Contrast  09/26/2015  CLINICAL DATA:  Shortness of breath. CT abdomen today demonstrating right lower lobe pneumonia and right hilar adenopathy. EXAM: CT CHEST WITHOUT CONTRAST TECHNIQUE: Multidetector CT imaging of the chest was performed following the standard protocol without IV contrast. COMPARISON:  Included lung bases from CT abdomen earlier today. FINDINGS: Mediastinum/Lymph Nodes: Extensive mediastinal adenopathy and masslike opacity in the right hilar and right mediastinum, with marked paratracheal thickening and circumferential soft tissue density posterior lateral to the mid distal  trachea, encasing the trachea at the carina, and causing luminal narrowing of the right mainstem bronchus. Exact measurements are difficult due to the large nature, however measures at least 9.5 cm in greatest dimension. Individual lymph nodes versus pulmonary mass cannot be discretely identified given lack contrast in the conglomerate soft tissue nature. Separate enlarged AP window lymph node measures 1.5 cm short axis with additional superior mediastinal, prevascular, and AP window nodes. There is abnormal left axillary adenopathy. Multiple small right axillary lymph nodes also seen. Mild cardiomegaly. Moderate coronary artery calcifications. Atherosclerosis of the thoracic aorta which is normal in caliber. Physiologic pericardial fluid. Lungs/Pleura: Abnormal right hilar soft tissue density with spiculation, a discrete right lung nodule or mass is not seen due to the conglomerate nature and lack of intravenous contrast. There luminal narrowing and near complete occlusion of the right mainstem bronchus secondary to bronchial encasement. Mass encases the bronchus intermedius with marked narrowing of the middle and lower lobe bronchi. Volume loss in the right lung with small right pleural effusion. Ill-defined and nodular right lower lobe opaciteis. 2 spiculated nodules in the left upper lobe. More superior nodule measures 2.4 x 1.8 x 2.2 cm with peripheral  cavitation. The more inferior nodule measures 2.1 x 1.6 x 2.0 cm. Both nodules have spiculated borders. Upper abdomen: Evaluated earlier this day at with CT abdomen/pelvis. Musculoskeletal: No destructive lytic or discrete blastic osseous lesion. 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 IMPRESSION: 1. 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 Large right hilar/mediastinal masslike soft tissue density causing encasement and narrowing of the right mainstem bronchus and bronchus intermedius. Discrete mass difficult separate from  the extensive multifocal mediastinal adenopathy. The right lower lobe opacities may be postobstructive atelectasis, spread of malignancy or pneumonia. Suspect bronchogenic malignancy, however primary lesion difficult to discern. 2. Two spiculated left upper lobe nodules, both approximately 2 cm, primary versus metastatic disease. These results were called by telephone at the time of interpretation on 09/26/2015 at 10:19 pm to Dr. Milton Ferguson , who verbally acknowledged these results. Electronically Signed   By: Jeb Levering M.D.   On: 09/26/2015 22:20    Scheduled Meds: . amLODipine  10 mg Oral Daily  . atenolol  100 mg Oral Daily  . cloNIDine  0.2 mg Oral BID  . enoxaparin (LOVENOX) injection  40 mg Subcutaneous Q24H  . insulin aspart  0-9 Units Subcutaneous TID WC  . irbesartan  150 mg Oral Daily  . [START ON 09/29/2015] levofloxacin (LEVAQUIN) IV  500 mg Intravenous Q48H  . pantoprazole  40 mg Oral Daily  . polyethylene glycol  17 g Oral BID  . traZODone  50 mg Oral QHS   Continuous Infusions: . sodium chloride 10 mL/hr at 09/27/15 0000    Active Problems:   TOBACCO USER   Chronic renal disease, stage 5, glomerular filtration rate less than or equal to 15 mL/min/1.73 square meter (HCC)   Amputee, below knee (HCC)   Hyponatremia   Dysphagia   Lung mass   Pneumonia    Time spent: 25 minutes. Greater than 50% of this time was spent in direct contact with the patient coordinating care.    Lelon Frohlich  [facility] Pager [phone]  If 7PM-7AM, please contact night-coverage at [email or web address], password [facility] 09/27/2015, 11:03 AM  LOS: 1 day

## 2015-09-27 NOTE — Progress Notes (Signed)
ANTIBIOTIC CONSULT NOTE-Preliminary  Pharmacy Consult for Levofloxacin Indication: Pneumonia  Allergies  Allergen Reactions  . Zocor [Simvastatin] Palpitations    Chest soreness  . Asa [Aspirin] Other (See Comments)    GI Bleed  . Doxycycline Diarrhea  . Hctz [Hydrochlorothiazide]     Leg cramps  . Iodine Hives    IVP dye  . Sulfa Antibiotics Other (See Comments)    Unknown     Patient Measurements: Height: '5\' 4"'$  (162.6 cm) Weight: 152 lb (68.947 kg) IBW/kg (Calculated) : 59.2  Vital Signs: Temp: 98.8 F (37.1 C) (04/07 1937) Temp Source: Oral (04/07 1937) BP: 153/62 mmHg (04/07 2230) Pulse Rate: 93 (04/07 2230)  Labs:  Recent Labs  09/26/15 1731  WBC 9.5  HGB 11.0*  PLT 262  CREATININE 4.05*    Estimated Creatinine Clearance: 15.2 mL/min (by C-G formula based on Cr of 4.05).  No results for input(s): VANCOTROUGH, VANCOPEAK, VANCORANDOM, GENTTROUGH, GENTPEAK, GENTRANDOM, TOBRATROUGH, TOBRAPEAK, TOBRARND, AMIKACINPEAK, AMIKACINTROU, AMIKACIN in the last 72 hours.   Microbiology: No results found for this or any previous visit (from the past 720 hour(s)).  Medical History: Past Medical History  Diagnosis Date  . Diverticulosis 03/04/10  . Peripheral vascular disease, unspecified ([place])   . Undiagnosed cardiac murmurs   . Other symptoms involving cardiovascular system   . HLD (hyperlipidemia)   . HTN (hypertension)   . Type II or unspecified type diabetes mellitus without mention of complication, not stated as uncontrolled   . Ulcer   . Carotid artery occlusion   . Umbilical hernia now    has not been repaired  . Blood transfusion   . AVM (arteriovenous malformation) of colon with hemorrhage   . Gastritis   . Internal hemorrhoids   . Right carotid bruit   . Shortness of breath     noted /w low Hgb  . GERD (gastroesophageal reflux disease)   . Chronic kidney disease     renal failure /w osteomyelitis- 2010  . Arthritis     back  . Anemia    8/3,4,5- blood transfusion- [facility], colonoscopy- done & found 2 areas of bleeding   . Tobacco use disorder   . Anxiety   . CHF (congestive heart failure) ([place])     3 yrs ago  . Irregular heart beat   . Iron deficiency anemia due to chronic blood loss 10/07/2010  . Chronic renal disease, stage 5, glomerular filtration rate less than or equal to 15 mL/min/1.73 square meter (HCC) 10/07/2010    Medications:  Vancomycin 1 Gm IV x one dose in the ED at 2225 09/26/15 Zosyn 3.375 Gm IV x 1 dose in the ED at 2145 09/26/15  Assessment: 65 yo male with complex medical history seen in the ED with epigastric pain, difficulty swallowing, SOB and sig hypoxia. Ct of chest showed possible pneumonia as well as extensive malignancy in the thorax. Pt to be given levofloxacin for r/o CAP.  Goal of Therapy:  Eradicate infection  Plan:  Preliminary review of pertinent patient information completed.  Protocol will be initiated with a one-time dose of levofloxacin 750 mg IV.  Forestine Na clinical pharmacist will complete review during morning rounds to assess patient and finalize treatment regimen.  Norberto Sorenson, [facility] 09/27/2015,12:14 AM

## 2015-09-27 NOTE — Progress Notes (Signed)
Pharmacy Antibiotic Note  [name] is a 65 y.o. male admitted on 09/26/2015 with pneumonia.  Pharmacy has been consulted for Levaquin dosing.  Plan: Levaquin '750mg'$  IV given as loading dose, then '500mg'$  IV every 48 hours F/U cultures Deescalate or transition to PO as appropriate  Height: '5\' 4"'$  (162.6 cm) Weight: 152 lb (68.947 kg) IBW/kg (Calculated) : 59.2  Temp (24hrs), Avg:98.4 F (36.9 C), Min:97.7 F (36.5 C), Max:98.8 F (37.1 C)   Recent Labs Lab 09/26/15 1731  WBC 9.5  CREATININE 4.05*    Estimated Creatinine Clearance: 15.2 mL/min (by C-G formula based on Cr of 4.05).    Allergies  Allergen Reactions  . Zocor [Simvastatin] Palpitations    Chest soreness  . Asa [Aspirin] Other (See Comments)    GI Bleed  . Doxycycline Diarrhea  . Hctz [Hydrochlorothiazide]     Leg cramps  . Iodine Hives    IVP dye  . Sulfa Antibiotics Other (See Comments)    Unknown     Antimicrobials this admission: levaquin 4/7 >>  Zosyn 4/7>> 4/7(one dose) Vancomycin 4/7>>4/7(one dose)  Microbiology results: 4/8 BCx: pending  Thank you for allowing pharmacy to be a part of this patient's care. Isac Sarna, BS Pharm D, [place] Clinical Pharmacist Pager [phone] 09/27/2015 7:37 AM

## 2015-09-27 NOTE — Consult Note (Signed)
Consult requested by: Dr. Jerilee Hoh Consult requested for abnormal chest CT:  HPI: This is a 65 year old with multiple medical problems who came to the hospital with abdominal pain. He's had difficulty swallowing for the last 2 months. He generally doesn't feel well. He has multiple medical problems as listed below. He had CT of the abdomen as part of his workup for his abdominal pain and had abnormalities noted on the portion of the chest that can be seen on the abdominal CT and then underwent full chest CT which shows extensive mediastinal adenopathy and a very large mass lesion. There is probably some obstructive pneumonia as well. As mentioned he has generally not felt well for the last several months. He is short of breath. He's been coughing a little bit. No hemoptysis. No chest pain. He thinks he's lost some weight he has an approximately 100-pack-year smoking history  Past Medical History  Diagnosis Date  . Diverticulosis 03/04/10  . Peripheral vascular disease, unspecified ([place])   . Undiagnosed cardiac murmurs   . Other symptoms involving cardiovascular system   . HLD (hyperlipidemia)   . HTN (hypertension)   . Type II or unspecified type diabetes mellitus without mention of complication, not stated as uncontrolled   . Ulcer   . Carotid artery occlusion   . Umbilical hernia now    has not been repaired  . Blood transfusion   . AVM (arteriovenous malformation) of colon with hemorrhage   . Gastritis   . Internal hemorrhoids   . Right carotid bruit   . Shortness of breath     noted /w low Hgb  . GERD (gastroesophageal reflux disease)   . Chronic kidney disease     renal failure /w osteomyelitis- 2010  . Arthritis     back  . Anemia     8/3,4,5- blood transfusion- [facility], colonoscopy- done & found 2 areas of bleeding   . Tobacco use disorder   . Anxiety   . CHF (congestive heart failure) ([place])     3 yrs ago  . Irregular heart beat   . Iron deficiency anemia due to chronic  blood loss 10/07/2010  . Chronic renal disease, stage 5, glomerular filtration rate less than or equal to 15 mL/min/1.73 square meter ([place]) 10/07/2010     Family History  Problem Relation Age of Onset  . Colon cancer Brother 47    deceased  . Hyperlipidemia Brother   . Hypertension Brother   . Lung cancer Sister     deceased, three primary cancers: lung, esophageal, lymphoma.   . Diabetes Sister   . Hypertension Sister   . Cancer Father     gallbladder  . Hyperlipidemia Father   . Hypertension Father   . Cancer Brother     unknown primary  . Cancer Sister     unknown primary  . Hyperlipidemia Sister   . Diabetes Mother   . Hypertension Mother      Social History   Social History  . Marital Status: Married    Spouse Name: N/A  . Number of Children: N/A  . Years of Education: N/A   Social History Main Topics  . Smoking status: Current Every Day Smoker -- 2.00 packs/day for 50 years    Types: Cigarettes  . Smokeless tobacco: Former Systems developer    Quit date: 02/08/2012     Comment: Smokes about 2 packs of tiny skinny cigarettes  . Alcohol Use: No  . Drug Use: No  . Sexual Activity: Not  Asked   Other Topics Concern  . None   Social History Narrative     ROS: As above    Objective: Vital signs in last 24 hours: Temp:  [97.7 F (36.5 C)-98.8 F (37.1 C)] 97.7 F (36.5 C) (04/08 0015) Pulse Rate:  [85-96] 89 (04/08 0926) Resp:  [16-17] 17 (04/07 2111) BP: (120-169)/(48-84) 134/72 mmHg (04/08 0926) SpO2:  [90 %-97 %] 97 % (04/08 0015) Weight:  [68.947 kg (152 lb)] 68.947 kg (152 lb) (04/08 0015) Weight change:  Last BM Date: 09/25/15  Intake/Output from previous day:    PHYSICAL EXAM He is awake and alert. He looks chronically sick. His pupils are reactive. Nose and throat are clear. He does not have any JVD. I don't feel any palpable adenopathy in his neck or the supraclavicular areas or in his axilla. His chest shows rhonchi bilaterally somewhat diminished  breath sounds on the right. His heart is regular. His abdomen is soft. Extremities showed no edema. Central nervous system exam grossly intact  Lab Results: Basic Metabolic Panel:  Recent Labs  09/26/15 1731 09/27/15 0731  NA 130* 132*  K 5.1 4.8  CL 94* 98*  CO2 27 26  GLUCOSE 158* 148*  BUN 77* 73*  CREATININE 4.05* 4.09*  CALCIUM 8.4* 8.4*   Liver Function Tests:  Recent Labs  09/26/15 1731 09/27/15 0731  AST 23 20  ALT 18 17  ALKPHOS 97 81  BILITOT 0.4 0.6  PROT 6.9 6.9  ALBUMIN 3.4* 3.3*    Recent Labs  09/26/15 1731  LIPASE 91*   No results for input(s): AMMONIA in the last 72 hours. CBC:  Recent Labs  09/26/15 1731 09/27/15 0731  WBC 9.5 11.4*  HGB 11.0* 10.2*  HCT 32.8* 30.3*  MCV 91.1 91.5  PLT 262 237   Cardiac Enzymes: No results for input(s): CKTOTAL, CKMB, CKMBINDEX, TROPONINI in the last 72 hours. BNP: No results for input(s): PROBNP in the last 72 hours. D-Dimer: No results for input(s): DDIMER in the last 72 hours. CBG:  Recent Labs  09/27/15 0743  GLUCAP 142*   Hemoglobin A1C: No results for input(s): HGBA1C in the last 72 hours. Fasting Lipid Panel: No results for input(s): CHOL, HDL, LDLCALC, TRIG, CHOLHDL, LDLDIRECT in the last 72 hours. Thyroid Function Tests: No results for input(s): TSH, T4TOTAL, FREET4, T3FREE, THYROIDAB in the last 72 hours. Anemia Panel: No results for input(s): VITAMINB12, FOLATE, FERRITIN, TIBC, IRON, RETICCTPCT in the last 72 hours. Coagulation: No results for input(s): LABPROT, INR in the last 72 hours. Urine Drug Screen: Drugs of Abuse  No results found for: LABOPIA, COCAINSCRNUR, LABBENZ, AMPHETMU, THCU, LABBARB  Alcohol Level: No results for input(s): ETH in the last 72 hours. Urinalysis:  Recent Labs  09/26/15 2120  COLORURINE YELLOW  LABSPEC 1.010  PHURINE 6.5  GLUCOSEU 100*  HGBUR NEGATIVE  BILIRUBINUR NEGATIVE  KETONESUR NEGATIVE  PROTEINUR 100*  NITRITE NEGATIVE   LEUKOCYTESUR NEGATIVE   Misc. Labs:   ABGS: No results for input(s): PHART, PO2ART, TCO2, HCO3 in the last 72 hours.  Invalid input(s): PCO2   MICROBIOLOGY: Recent Results (from the past 240 hour(s))  Culture, blood (Routine X 2) w Reflex to ID Panel     Status: None (Preliminary result)   Collection Time: 09/27/15  1:09 AM  Result Value Ref Range Status   Specimen Description RIGHT ANTECUBITAL  Final   Special Requests BOTTLES DRAWN AEROBIC ONLY [facility]  Final   Culture PENDING  Incomplete   Report  Status PENDING  Incomplete  Culture, blood (Routine X 2) w Reflex to ID Panel     Status: None (Preliminary result)   Collection Time: 09/27/15  1:27 AM  Result Value Ref Range Status   Specimen Description BLOOD RIGHT ARM  Final   Special Requests BOTTLES DRAWN AEROBIC ONLY 6CC  Final   Culture PENDING  Incomplete   Report Status PENDING  Incomplete    Studies/Results: Ct Abdomen Pelvis Wo Contrast  09/26/2015  CLINICAL DATA:  Abdominal pain with decreased appetite following esophageal dilatation procedure EXAM: CT ABDOMEN AND PELVIS WITHOUT CONTRAST TECHNIQUE: Multidetector CT imaging of the abdomen and pelvis was performed following the standard protocol without IV contrast. COMPARISON:  None. FINDINGS: Lung bases demonstrate evidence of right lower lobe pneumonia. Some fullness is also noted anterior to the right main pulmonary artery which likely represents lymphadenopathy. Dedicated CT of the chest is recommended. The liver, gallbladder, spleen, adrenal glands and pancreas are all normal in their CT appearance. The kidneys are well visualized bilaterally and demonstrate renal vascular calcifications. No renal stones or obstructive changes are seen. The bladder is partially distended. Significant calcifications of the seminal vesicles are noted bilaterally. The vas deferens are also calcified. Aortoiliac calcifications are seen. A small umbilical hernia is noted containing fat. The  appendix is within normal limits. No significant diverticular change is noted. Stomach demonstrates clips consistent with the prior endoscopy procedure. IMPRESSION: Right lower lobe pneumonia as well as changes consistent with lymphadenopathy in the region of the right hilum and adjacent to the right pulmonary artery. CT of the chest is recommended for further evaluation. Chronic changes in the abdomen and pelvis. No acute abnormality is seen. Electronically Signed   By: Inez Catalina M.D.   On: 09/26/2015 20:29   Ct Chest Wo Contrast  09/26/2015  CLINICAL DATA:  Shortness of breath. CT abdomen today demonstrating right lower lobe pneumonia and right hilar adenopathy. EXAM: CT CHEST WITHOUT CONTRAST TECHNIQUE: Multidetector CT imaging of the chest was performed following the standard protocol without IV contrast. COMPARISON:  Included lung bases from CT abdomen earlier today. FINDINGS: Mediastinum/Lymph Nodes: Extensive mediastinal adenopathy and masslike opacity in the right hilar and right mediastinum, with marked paratracheal thickening and circumferential soft tissue density posterior lateral to the mid distal trachea, encasing the trachea at the carina, and causing luminal narrowing of the right mainstem bronchus. Exact measurements are difficult due to the large nature, however measures at least 9.5 cm in greatest dimension. Individual lymph nodes versus pulmonary mass cannot be discretely identified given lack contrast in the conglomerate soft tissue nature. Separate enlarged AP window lymph node measures 1.5 cm short axis with additional superior mediastinal, prevascular, and AP window nodes. There is abnormal left axillary adenopathy. Multiple small right axillary lymph nodes also seen. Mild cardiomegaly. Moderate coronary artery calcifications. Atherosclerosis of the thoracic aorta which is normal in caliber. Physiologic pericardial fluid. Lungs/Pleura: Abnormal right hilar soft tissue density with  spiculation, a discrete right lung nodule or mass is not seen due to the conglomerate nature and lack of intravenous contrast. There luminal narrowing and near complete occlusion of the right mainstem bronchus secondary to bronchial encasement. Mass encases the bronchus intermedius with marked narrowing of the middle and lower lobe bronchi. Volume loss in the right lung with small right pleural effusion. Ill-defined and nodular right lower lobe opaciteis. 2 spiculated nodules in the left upper lobe. More superior nodule measures 2.4 x 1.8 x 2.2 cm with peripheral cavitation. The  more inferior nodule measures 2.1 x 1.6 x 2.0 cm. Both nodules have spiculated borders. Upper abdomen: Evaluated earlier this day at with CT abdomen/pelvis. Musculoskeletal: No destructive lytic or discrete blastic osseous lesion. 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 IMPRESSION: 1. 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 Large right hilar/mediastinal masslike soft tissue density causing encasement and narrowing of the right mainstem bronchus and bronchus intermedius. Discrete mass difficult separate from the extensive multifocal mediastinal adenopathy. The right lower lobe opacities may be postobstructive atelectasis, spread of malignancy or pneumonia. Suspect bronchogenic malignancy, however primary lesion difficult to discern. 2. Two spiculated left upper lobe nodules, both approximately 2 cm, primary versus metastatic disease. These results were called by telephone at the time of interpretation on 09/26/2015 at 10:19 pm to Dr. Milton Ferguson , who verbally acknowledged these results. Electronically Signed   By: Jeb Levering M.D.   On: 09/26/2015 22:20    Medications:  Prior to Admission:  Prescriptions prior to admission  Medication Sig Dispense Refill Last Dose  . acetaminophen (TYLENOL) 500 MG tablet Take 1,000 mg by mouth daily as needed for mild pain or headache.   09/25/2015 at Unknown  time  . amLODipine (NORVASC) 10 MG tablet TAKE 1 TABLET BY MOUTH DAILY, BEFORE BREAKFAST 90 tablet 1 09/26/2015 at Unknown time  . atenolol (TENORMIN) 100 MG tablet TAKE 1 TABLET (100 MG TOTAL) BY MOUTH DAILY. (Patient taking differently: TAKE 1/2 TABLET (50 MG TOTAL) BY MOUTH TWICE  DAILY.) 90 tablet 1 09/26/2015 at 800A  . Calcium Carbonate Antacid (TUMS ULTRA 1000 PO) Take 2 tablets by mouth daily as needed (indigestion).    Past Week at Unknown time  . cloNIDine (CATAPRES) 0.2 MG tablet Take 1 tablet (0.2 mg total) by mouth 2 (two) times daily. 60 tablet 3 09/26/2015 at Unknown time  . furosemide (LASIX) 40 MG tablet TAKE 1 TABLET (40 MG TOTAL) BY MOUTH AS NEEDED. (Patient taking differently: take '40mg'$ s 3 times daily as needed for swelling) 30 tablet 5 09/26/2015 at Unknown time  . gabapentin (NEURONTIN) 300 MG capsule Take 1 capsule (300 mg total) by mouth 3 (three) times daily. (Patient taking differently: Take 300 mg by mouth daily as needed (nerve pain). ) 270 capsule 3 Past Week at Unknown time  . glipiZIDE (GLUCOTROL) 10 MG tablet Take 0.5 tablets (5 mg total) by mouth daily before breakfast. Takes one tablet in the Am and 1/2 tablet in the pm (Patient taking differently: Take 5 mg by mouth daily as needed (high blood sugar). ) 135 tablet 0 09/25/2015  . ondansetron (ZOFRAN) 4 MG tablet Take 1 tablet (4 mg total) by mouth 4 (four) times daily -  before meals and at bedtime. 120 tablet 1 UNKNOWN  . pantoprazole (PROTONIX) 40 MG tablet TAKE 1 TABLET BY MOUTH TWICE A DAY BEFORE MEAL 180 tablet 1 09/26/2015 at Unknown time  . polyethylene glycol (MIRALAX / GLYCOLAX) packet Take 17 g by mouth daily as needed for mild constipation (*tAKES [place]).    09/26/2015 at Unknown time  . silver sulfADIAZINE (SILVADENE) 1 % cream APPLY TO AFFECTED AREA TWICE A DAY (Patient taking differently: Apply 1 application topically 2 (two) times daily as needed (sore skin). ) 240 g 1 UNKNOWN  . valsartan (DIOVAN) 160  MG tablet Take 1 tablet (160 mg total) by mouth daily. 30 tablet 5 09/26/2015 at Unknown time  . Vitamin D, Ergocalciferol, (DRISDOL) 50000 units CAPS capsule Take 50,000 Units by mouth  once a week. Takes on Saturdays.  0 09/20/2015 at Unknown time  . doxycycline (VIBRA-TABS) 100 MG tablet Take 1 tablet (100 mg total) by mouth 2 (two) times daily. 1 po bid (Patient not taking: Reported on 09/26/2015) 20 tablet 0   . HYDROcodone-acetaminophen (NORCO/VICODIN) 5-325 MG tablet Take 1 tablet by mouth every 8 (eight) hours as needed. (Patient not taking: Reported on 09/26/2015) 30 tablet 0 unknown   Scheduled: . amLODipine  10 mg Oral Daily  . atenolol  100 mg Oral Daily  . cloNIDine  0.2 mg Oral BID  . enoxaparin (LOVENOX) injection  40 mg Subcutaneous Q24H  . insulin aspart  0-9 Units Subcutaneous TID WC  . irbesartan  150 mg Oral Daily  . [START ON 09/29/2015] levofloxacin (LEVAQUIN) IV  500 mg Intravenous Q48H  . pantoprazole  40 mg Oral Daily  . polyethylene glycol  17 g Oral BID  . traZODone  50 mg Oral QHS   Continuous: . sodium chloride 10 mL/hr at 09/27/15 0000   VQX:[id number], albuterol, gabapentin, morphine injection, ondansetron **OR** ondansetron (ZOFRAN) IV  Assesment: He has a lung mass which I think is very likely lung cancer. I discussed this with he and his wife. I told him that he needs to have bronchoscopy that will be done next week. He wants to know if he needs to stay in the hospital for this and I told him since he came in with pain and increased shortness of breath and probable obstructive pneumonia I think he needs to stay at least for now. 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

## 2015-09-28 DIAGNOSIS — Z89511 Acquired absence of right leg below knee: Secondary | ICD-10-CM | POA: Diagnosis not present

## 2015-09-28 DIAGNOSIS — R1310 Dysphagia, unspecified: Secondary | ICD-10-CM | POA: Diagnosis not present

## 2015-09-28 DIAGNOSIS — J449 Chronic obstructive pulmonary disease, unspecified: Secondary | ICD-10-CM | POA: Diagnosis not present

## 2015-09-28 DIAGNOSIS — R918 Other nonspecific abnormal finding of lung field: Secondary | ICD-10-CM | POA: Diagnosis not present

## 2015-09-28 DIAGNOSIS — N185 Chronic kidney disease, stage 5: Secondary | ICD-10-CM

## 2015-09-28 DIAGNOSIS — E871 Hypo-osmolality and hyponatremia: Secondary | ICD-10-CM | POA: Diagnosis not present

## 2015-09-28 LAB — GLUCOSE, CAPILLARY
Glucose-Capillary: 136 mg/dL — ABNORMAL HIGH (ref 65–99)
Glucose-Capillary: 153 mg/dL — ABNORMAL HIGH (ref 65–99)
Glucose-Capillary: 165 mg/dL — ABNORMAL HIGH (ref 65–99)
Glucose-Capillary: 161 mg/dL — ABNORMAL HIGH (ref 65–99)

## 2015-09-28 MED ORDER — LEVOFLOXACIN 500 MG PO TABS
500.0000 mg | ORAL_TABLET | ORAL | Status: DC
  Administered 2015-09-28: 500 mg via ORAL
  Filled 2015-09-28: qty 1

## 2015-09-28 NOTE — Progress Notes (Signed)
[facility] PROGRESS NOTE  [name] SNK:[id number] DOB: [birth year] DOA: 09/26/2015 PCP: Chevis Pretty, FNP  Assessment/Plan: Large lung mass -Likely represents lung cancer. -Has been seen by Dr. Luan Pulling was planning for bronchoscopy in am to hopefully achieve diagnosis. -Will need follow-up with oncology once tissue diagnosis is obtained.  Postobstructive pneumonia -Agree with continuation of Levaquin (change to PO). -We'll treat for 8 days. -culture data pending.  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  Dysphagia -Likely from thoracic mass, continue diet as tolerated (can downgrade if patient unable to tolerate solid diet) and request speech therapy evaluation.  Hyponatremia -Sodium remains low at 130 to 132. Suspect this is related to lung cancer.  Code Status: Full code Family Communication: Wife at bedside updated on plan of care and all questions answered  Disposition Plan: To be determined   Consultants:  Pulmonology, Dr. Luan Pulling   Antibiotics:  Levaquin   Subjective: Tired, fatigued, appears depressed with flat affect  Objective: Filed Vitals:   09/27/15 2112 09/28/15 0650 09/28/15 0817 09/28/15 1035  BP: 162/69 136/52  166/60  Pulse: 89 83  85  Temp: 98.5 F (36.9 C) 98.9 F (37.2 C)    TempSrc: Oral Oral    Resp: 18 18    Height:      Weight:      SpO2: 93% 92% 92%     Intake/Output Summary (Last 24 hours) at 09/28/15 1159 Last data filed at 09/27/15 2116  Gross per 24 hour  Intake      0 ml  Output    300 ml  Net   -300 ml   Filed Weights   09/26/15 1727 09/27/15 0015  Weight: 68.947 kg (152 lb) 68.947 kg (152 lb)    Exam:   General:  Alert, awake, oriented 3  Cardiovascular: Regular rate and rhythm  Respiratory: Clear to auscultation bilaterally  Abdomen: Soft, nontender, nondistended, positive bowel sounds  Extremities: No clubbing, cyanosis or edema,  positive pulses ; 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  Neurologic:  Grossly intact and nonfocal  Data Reviewed: Basic Metabolic Panel:  Recent Labs Lab 09/26/15 1731 09/27/15 0731  NA 130* 132*  K 5.1 4.8  CL 94* 98*  CO2 27 26  GLUCOSE 158* 148*  BUN 77* 73*  CREATININE 4.05* 4.09*  CALCIUM 8.4* 8.4*   Liver Function Tests:  Recent Labs Lab 09/26/15 1731 09/27/15 0731  AST 23 20  ALT 18 17  ALKPHOS 97 81  BILITOT 0.4 0.6  PROT 6.9 6.9  ALBUMIN 3.4* 3.3*    Recent Labs Lab 09/26/15 1731  LIPASE 91*   No results for input(s): AMMONIA in the last 168 hours. CBC:  Recent Labs Lab 09/26/15 1731 09/27/15 0731  WBC 9.5 11.4*  HGB 11.0* 10.2*  HCT 32.8* 30.3*  MCV 91.1 91.5  PLT 262 237   Cardiac Enzymes: No results for input(s): CKTOTAL, CKMB, CKMBINDEX, TROPONINI in the last 168 hours. BNP (last 3 results) No results for input(s): BNP in the last 8760 hours.  ProBNP (last 3 results) No results for input(s): PROBNP in the last 8760 hours.  CBG:  Recent Labs Lab 09/27/15 0743 09/27/15 1057 09/27/15 1720 09/27/15 2115 09/28/15 0748  GLUCAP 142* 162* 164* 157* 136*    Recent Results (from the past 240 hour(s))  Culture, blood (Routine X 2) w Reflex to ID Panel     Status: None (Preliminary  result)   Collection Time: 09/27/15  1:09 AM  Result Value Ref Range Status   Specimen Description RIGHT ANTECUBITAL  Final   Special Requests BOTTLES DRAWN AEROBIC ONLY 6CC  Final   Culture NO GROWTH 1 DAY  Final   Report Status PENDING  Incomplete  Culture, blood (Routine X 2) w Reflex to ID Panel     Status: None (Preliminary result)   Collection Time: 09/27/15  1:27 AM  Result Value Ref Range Status   Specimen Description BLOOD RIGHT ARM  Final   Special Requests BOTTLES DRAWN AEROBIC ONLY 6CC  Final   Culture NO GROWTH 1 DAY  Final   Report Status PENDING  Incomplete     Studies: Ct Abdomen Pelvis Wo Contrast  09/26/2015   CLINICAL DATA:  Abdominal pain with decreased appetite following esophageal dilatation procedure EXAM: CT ABDOMEN AND PELVIS WITHOUT CONTRAST TECHNIQUE: Multidetector CT imaging of the abdomen and pelvis was performed following the standard protocol without IV contrast. COMPARISON:  None. FINDINGS: Lung bases demonstrate evidence of right lower lobe pneumonia. Some fullness is also noted anterior to the right main pulmonary artery which likely represents lymphadenopathy. Dedicated CT of the chest is recommended. The liver, gallbladder, spleen, adrenal glands and pancreas are all normal in their CT appearance. The kidneys are well visualized bilaterally and demonstrate renal vascular calcifications. No renal stones or obstructive changes are seen. The bladder is partially distended. Significant calcifications of the seminal vesicles are noted bilaterally. The vas deferens are also calcified. Aortoiliac calcifications are seen. A small umbilical hernia is noted containing fat. The appendix is within normal limits. No significant diverticular change is noted. Stomach demonstrates clips consistent with the prior endoscopy procedure. IMPRESSION: Right lower lobe pneumonia as well as changes consistent with lymphadenopathy in the region of the right hilum and adjacent to the right pulmonary artery. CT of the chest is recommended for further evaluation. Chronic changes in the abdomen and pelvis. No acute abnormality is seen. Electronically Signed   By: Inez Catalina M.D.   On: 09/26/2015 20:29   Ct Chest Wo Contrast  09/26/2015  CLINICAL DATA:  Shortness of breath. CT abdomen today demonstrating right lower lobe pneumonia and right hilar adenopathy. EXAM: CT CHEST WITHOUT CONTRAST TECHNIQUE: Multidetector CT imaging of the chest was performed following the standard protocol without IV contrast. COMPARISON:  Included lung bases from CT abdomen earlier today. FINDINGS: Mediastinum/Lymph Nodes: Extensive mediastinal  adenopathy and masslike opacity in the right hilar and right mediastinum, with marked paratracheal thickening and circumferential soft tissue density posterior lateral to the mid distal trachea, encasing the trachea at the carina, and causing luminal narrowing of the right mainstem bronchus. Exact measurements are difficult due to the large nature, however measures at least 9.5 cm in greatest dimension. Individual lymph nodes versus pulmonary mass cannot be discretely identified given lack contrast in the conglomerate soft tissue nature. Separate enlarged AP window lymph node measures 1.5 cm short axis with additional superior mediastinal, prevascular, and AP window nodes. There is abnormal left axillary adenopathy. Multiple small right axillary lymph nodes also seen. Mild cardiomegaly. Moderate coronary artery calcifications. Atherosclerosis of the thoracic aorta which is normal in caliber. Physiologic pericardial fluid. Lungs/Pleura: Abnormal right hilar soft tissue density with spiculation, a discrete right lung nodule or mass is not seen due to the conglomerate nature and lack of intravenous contrast. There luminal narrowing and near complete occlusion of the right mainstem bronchus secondary to bronchial encasement. Mass encases the bronchus intermedius  with marked narrowing of the middle and lower lobe bronchi. Volume loss in the right lung with small right pleural effusion. Ill-defined and nodular right lower lobe opaciteis. 2 spiculated nodules in the left upper lobe. More superior nodule measures 2.4 x 1.8 x 2.2 cm with peripheral cavitation. The more inferior nodule measures 2.1 x 1.6 x 2.0 cm. Both nodules have spiculated borders. Upper abdomen: Evaluated earlier this day at with CT abdomen/pelvis. Musculoskeletal: No destructive lytic or discrete blastic osseous lesion. 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 IMPRESSION: 1. 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  Large right hilar/mediastinal masslike soft tissue density causing encasement and narrowing of the right mainstem bronchus and bronchus intermedius. Discrete mass difficult separate from the extensive multifocal mediastinal adenopathy. The right lower lobe opacities may be postobstructive atelectasis, spread of malignancy or pneumonia. Suspect bronchogenic malignancy, however primary lesion difficult to discern. 2. Two spiculated left upper lobe nodules, both approximately 2 cm, primary versus metastatic disease. These results were called by telephone at the time of interpretation on 09/26/2015 at 10:19 pm to Dr. Milton Ferguson , who verbally acknowledged these results. Electronically Signed   By: Jeb Levering M.D.   On: 09/26/2015 22:20    Scheduled Meds: . amLODipine  10 mg Oral Daily  . atenolol  100 mg Oral Daily  . cloNIDine  0.2 mg Oral BID  . enoxaparin (LOVENOX) injection  30 mg Subcutaneous Q24H  . insulin aspart  0-9 Units Subcutaneous TID WC  . irbesartan  150 mg Oral Daily  . [START ON 09/29/2015] levofloxacin (LEVAQUIN) IV  500 mg Intravenous Q48H  . nicotine  21 mg Transdermal Daily  . pantoprazole  40 mg Oral Daily  . polyethylene glycol  17 g Oral BID  . traZODone  50 mg Oral QHS   Continuous Infusions: . sodium chloride 10 mL/hr at 09/27/15 0000    Active Problems:   TOBACCO USER   Chronic renal disease, stage 5, glomerular filtration rate less than or equal to 15 mL/min/1.73 square meter (HCC)   Amputee, below knee (HCC)   Hyponatremia   Dysphagia   Lung mass   Pneumonia    Time spent: 25 minutes. Greater than 50% of this time was spent in direct contact with the patient coordinating care.    Lelon Frohlich  [facility] Pager [phone]  If 7PM-7AM, please contact night-coverage at [email or web address], password [facility] 09/28/2015, 11:59 AM  LOS: 2 days

## 2015-09-28 NOTE — Progress Notes (Signed)
Subjective: He said he feels okay. He still has cough and congestion. No hemoptysis.  Objective: Vital signs in last 24 hours: Temp:  [97.6 F (36.4 C)-98.9 F (37.2 C)] 98.9 F (37.2 C) (04/09 0650) Pulse Rate:  [83-89] 83 (04/09 0650) Resp:  [18-20] 18 (04/09 0650) BP: (135-162)/(52-69) 136/52 mmHg (04/09 0650) SpO2:  [92 %-95 %] 92 % (04/09 0817) Weight change:  Last BM Date: 09/27/15  Intake/Output from previous day: 04/08 0701 - 04/09 0700 In: -  Out: 300 [Urine:300]  PHYSICAL EXAM General appearance: alert, cooperative and no distress Resp: rhonchi bilaterally Cardio: regular rate and rhythm, S1, S2 normal, no murmur, click, rub or gallop GI: soft, non-tender; bowel sounds normal; no masses,  no organomegaly Extremities: extremities normal, atraumatic, no cyanosis or edema  Lab Results:  Results for orders placed or performed during the hospital encounter of 09/26/15 (from the past 48 hour(s))  Lipase, blood     Status: Abnormal   Collection Time: 09/26/15  5:31 PM  Result Value Ref Range   Lipase 91 (H) 11 - 51 U/[name]  Comprehensive metabolic panel     Status: Abnormal   Collection Time: 09/26/15  5:31 PM  Result Value Ref Range   Sodium 130 ([name]) 135 - 145 mmol/[name]   Potassium 5.1 3.5 - 5.1 mmol/[name]   Chloride 94 ([name]) 101 - 111 mmol/[name]   CO2 27 22 - 32 mmol/[name]   Glucose, Bld 158 (H) 65 - 99 mg/dL   BUN 77 (H) 6 - 20 mg/dL   Creatinine, Ser 4.05 (H) 0.61 - 1.24 mg/dL   Calcium 8.4 ([name]) 8.9 - 10.3 mg/dL   Total Protein 6.9 6.5 - 8.1 g/dL   Albumin 3.4 ([name]) 3.5 - 5.0 g/dL   AST 23 15 - 41 U/[name]   ALT 18 17 - 63 U/[name]   Alkaline Phosphatase 97 38 - 126 U/[name]   Total Bilirubin 0.4 0.3 - 1.2 mg/dL   GFR calc non Af Amer 14 ([name]) >60 mL/min   GFR calc Af Amer 16 ([name]) >60 mL/min    Comment: (NOTE) The eGFR has been calculated using the CKD EPI equation. This calculation has not been validated in all clinical situations. eGFR's persistently <60 mL/min signify possible Chronic  Kidney Disease.    Anion gap 9 5 - 15  CBC     Status: Abnormal   Collection Time: 09/26/15  5:31 PM  Result Value Ref Range   WBC 9.5 4.0 - 10.5 K/uL   RBC 3.60 ([name]) 4.22 - 5.81 MIL/uL   Hemoglobin 11.0 ([name]) 13.0 - 17.0 g/dL   HCT 32.8 ([name]) 39.0 - 52.0 %   MCV 91.1 78.0 - 100.0 fL   MCH 30.6 26.0 - 34.0 pg   MCHC 33.5 30.0 - 36.0 g/dL   RDW 15.1 11.5 - 15.5 %   Platelets 262 150 - 400 K/uL  Urinalysis, Routine w reflex microscopic (not at [facility])     Status: Abnormal   Collection Time: 09/26/15  9:20 PM  Result Value Ref Range   Color, Urine YELLOW YELLOW   APPearance CLEAR CLEAR   Specific Gravity, Urine 1.010 1.005 - 1.030   pH 6.5 5.0 - 8.0   Glucose, UA 100 (A) NEGATIVE mg/dL   Hgb urine dipstick NEGATIVE NEGATIVE   Bilirubin Urine NEGATIVE NEGATIVE   Ketones, ur NEGATIVE NEGATIVE mg/dL   Protein, ur 100 (A) NEGATIVE mg/dL   Nitrite NEGATIVE NEGATIVE   Leukocytes, UA NEGATIVE NEGATIVE  Urine microscopic-add on  Status: None   Collection Time: 09/26/15  9:20 PM  Result Value Ref Range   Squamous Epithelial / LPF NONE SEEN NONE SEEN   WBC, UA 0-5 0 - 5 WBC/hpf   RBC / HPF 0-5 0 - 5 RBC/hpf   Bacteria, UA NONE SEEN NONE SEEN  Osmolality     Status: Abnormal   Collection Time: 09/27/15  1:09 AM  Result Value Ref Range   Osmolality 307 (H) 275 - 295 mOsm/kg    Comment: Performed at [facility]  Culture, blood (Routine X 2) w Reflex to ID Panel     Status: None (Preliminary result)   Collection Time: 09/27/15  1:09 AM  Result Value Ref Range   Specimen Description RIGHT ANTECUBITAL    Special Requests BOTTLES DRAWN AEROBIC ONLY 6CC    Culture NO GROWTH 1 DAY    Report Status PENDING   Culture, blood (Routine X 2) w Reflex to ID Panel     Status: None (Preliminary result)   Collection Time: 09/27/15  1:27 AM  Result Value Ref Range   Specimen Description BLOOD RIGHT ARM    Special Requests BOTTLES DRAWN AEROBIC ONLY 6CC    Culture NO GROWTH 1 DAY    Report  Status PENDING   CBC     Status: Abnormal   Collection Time: 09/27/15  7:31 AM  Result Value Ref Range   WBC 11.4 (H) 4.0 - 10.5 K/uL   RBC 3.31 ([name]) 4.22 - 5.81 MIL/uL   Hemoglobin 10.2 ([name]) 13.0 - 17.0 g/dL   HCT 30.3 ([name]) 39.0 - 52.0 %   MCV 91.5 78.0 - 100.0 fL   MCH 30.8 26.0 - 34.0 pg   MCHC 33.7 30.0 - 36.0 g/dL   RDW 15.1 11.5 - 15.5 %   Platelets 237 150 - 400 K/uL  Comprehensive metabolic panel     Status: Abnormal   Collection Time: 09/27/15  7:31 AM  Result Value Ref Range   Sodium 132 ([name]) 135 - 145 mmol/[name]   Potassium 4.8 3.5 - 5.1 mmol/[name]   Chloride 98 ([name]) 101 - 111 mmol/[name]   CO2 26 22 - 32 mmol/[name]   Glucose, Bld 148 (H) 65 - 99 mg/dL   BUN 73 (H) 6 - 20 mg/dL   Creatinine, Ser 4.09 (H) 0.61 - 1.24 mg/dL   Calcium 8.4 ([name]) 8.9 - 10.3 mg/dL   Total Protein 6.9 6.5 - 8.1 g/dL   Albumin 3.3 ([name]) 3.5 - 5.0 g/dL   AST 20 15 - 41 U/[name]   ALT 17 17 - 63 U/[name]   Alkaline Phosphatase 81 38 - 126 U/[name]   Total Bilirubin 0.6 0.3 - 1.2 mg/dL   GFR calc non Af Amer 14 ([name]) >60 mL/min   GFR calc Af Amer 16 ([name]) >60 mL/min    Comment: (NOTE) The eGFR has been calculated using the CKD EPI equation. This calculation has not been validated in all clinical situations. eGFR's persistently <60 mL/min signify possible Chronic Kidney Disease.    Anion gap 8 5 - 15  Glucose, capillary     Status: Abnormal   Collection Time: 09/27/15  7:43 AM  Result Value Ref Range   Glucose-Capillary 142 (H) 65 - 99 mg/dL   Comment 1 Notify RN   Glucose, capillary     Status: Abnormal   Collection Time: 09/27/15 10:57 AM  Result Value Ref Range   Glucose-Capillary 162 (H) 65 - 99 mg/dL   Comment 1 Notify  RN   Glucose, capillary     Status: Abnormal   Collection Time: 09/27/15  5:20 PM  Result Value Ref Range   Glucose-Capillary 164 (H) 65 - 99 mg/dL   Comment 1 Notify RN   Glucose, capillary     Status: Abnormal   Collection Time: 09/27/15  9:15 PM  Result Value Ref Range   Glucose-Capillary 157 (H) 65  - 99 mg/dL  Glucose, capillary     Status: Abnormal   Collection Time: 09/28/15  7:48 AM  Result Value Ref Range   Glucose-Capillary 136 (H) 65 - 99 mg/dL    ABGS No results for input(s): PHART, PO2ART, TCO2, HCO3 in the last 72 hours.  Invalid input(s): PCO2 CULTURES Recent Results (from the past 240 hour(s))  Culture, blood (Routine X 2) w Reflex to ID Panel     Status: None (Preliminary result)   Collection Time: 09/27/15  1:09 AM  Result Value Ref Range Status   Specimen Description RIGHT ANTECUBITAL  Final   Special Requests BOTTLES DRAWN AEROBIC ONLY 6CC  Final   Culture NO GROWTH 1 DAY  Final   Report Status PENDING  Incomplete  Culture, blood (Routine X 2) w Reflex to ID Panel     Status: None (Preliminary result)   Collection Time: 09/27/15  1:27 AM  Result Value Ref Range Status   Specimen Description BLOOD RIGHT ARM  Final   Special Requests BOTTLES DRAWN AEROBIC ONLY 6CC  Final   Culture NO GROWTH 1 DAY  Final   Report Status PENDING  Incomplete   Studies/Results: Ct Abdomen Pelvis Wo Contrast  09/26/2015  CLINICAL DATA:  Abdominal pain with decreased appetite following esophageal dilatation procedure EXAM: CT ABDOMEN AND PELVIS WITHOUT CONTRAST TECHNIQUE: Multidetector CT imaging of the abdomen and pelvis was performed following the standard protocol without IV contrast. COMPARISON:  None. FINDINGS: Lung bases demonstrate evidence of right lower lobe pneumonia. Some fullness is also noted anterior to the right main pulmonary artery which likely represents lymphadenopathy. Dedicated CT of the chest is recommended. The liver, gallbladder, spleen, adrenal glands and pancreas are all normal in their CT appearance. The kidneys are well visualized bilaterally and demonstrate renal vascular calcifications. No renal stones or obstructive changes are seen. The bladder is partially distended. Significant calcifications of the seminal vesicles are noted bilaterally. The vas deferens  are also calcified. Aortoiliac calcifications are seen. A small umbilical hernia is noted containing fat. The appendix is within normal limits. No significant diverticular change is noted. Stomach demonstrates clips consistent with the prior endoscopy procedure. IMPRESSION: Right lower lobe pneumonia as well as changes consistent with lymphadenopathy in the region of the right hilum and adjacent to the right pulmonary artery. CT of the chest is recommended for further evaluation. Chronic changes in the abdomen and pelvis. No acute abnormality is seen. Electronically Signed   By: Alcide Clever M.D.   On: 09/26/2015 20:29   Ct Chest Wo Contrast  09/26/2015  CLINICAL DATA:  Shortness of breath. CT abdomen today demonstrating right lower lobe pneumonia and right hilar adenopathy. EXAM: CT CHEST WITHOUT CONTRAST TECHNIQUE: Multidetector CT imaging of the chest was performed following the standard protocol without IV contrast. COMPARISON:  Included lung bases from CT abdomen earlier today. FINDINGS: Mediastinum/Lymph Nodes: Extensive mediastinal adenopathy and masslike opacity in the right hilar and right mediastinum, with marked paratracheal thickening and circumferential soft tissue density posterior lateral to the mid distal trachea, encasing the trachea at the carina, and causing  luminal narrowing of the right mainstem bronchus. Exact measurements are difficult due to the large nature, however measures at least 9.5 cm in greatest dimension. Individual lymph nodes versus pulmonary mass cannot be discretely identified given lack contrast in the conglomerate soft tissue nature. Separate enlarged AP window lymph node measures 1.5 cm short axis with additional superior mediastinal, prevascular, and AP window nodes. There is abnormal left axillary adenopathy. Multiple small right axillary lymph nodes also seen. Mild cardiomegaly. Moderate coronary artery calcifications. Atherosclerosis of the thoracic aorta which is normal  in caliber. Physiologic pericardial fluid. Lungs/Pleura: Abnormal right hilar soft tissue density with spiculation, a discrete right lung nodule or mass is not seen due to the conglomerate nature and lack of intravenous contrast. There luminal narrowing and near complete occlusion of the right mainstem bronchus secondary to bronchial encasement. Mass encases the bronchus intermedius with marked narrowing of the middle and lower lobe bronchi. Volume loss in the right lung with small right pleural effusion. Ill-defined and nodular right lower lobe opaciteis. 2 spiculated nodules in the left upper lobe. More superior nodule measures 2.4 x 1.8 x 2.2 cm with peripheral cavitation. The more inferior nodule measures 2.1 x 1.6 x 2.0 cm. Both nodules have spiculated borders. Upper abdomen: Evaluated earlier this day at with CT abdomen/pelvis. Musculoskeletal: No destructive lytic or discrete blastic osseous lesion. 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 IMPRESSION: 1. 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 Large right hilar/mediastinal masslike soft tissue density causing encasement and narrowing of the right mainstem bronchus and bronchus intermedius. Discrete mass difficult separate from the extensive multifocal mediastinal adenopathy. The right lower lobe opacities may be postobstructive atelectasis, spread of malignancy or pneumonia. Suspect bronchogenic malignancy, however primary lesion difficult to discern. 2. Two spiculated left upper lobe nodules, both approximately 2 cm, primary versus metastatic disease. These results were called by telephone at the time of interpretation on 09/26/2015 at 10:19 pm to Dr. Milton Ferguson , who verbally acknowledged these results. Electronically Signed   By: Jeb Levering M.D.   On: 09/26/2015 22:20    Medications:  Prior to Admission:  Prescriptions prior to admission  Medication Sig Dispense Refill Last Dose  . acetaminophen (TYLENOL)  500 MG tablet Take 1,000 mg by mouth daily as needed for mild pain or headache.   09/25/2015 at Unknown time  . amLODipine (NORVASC) 10 MG tablet TAKE 1 TABLET BY MOUTH DAILY, BEFORE BREAKFAST 90 tablet 1 09/26/2015 at Unknown time  . atenolol (TENORMIN) 100 MG tablet TAKE 1 TABLET (100 MG TOTAL) BY MOUTH DAILY. (Patient taking differently: TAKE 1/2 TABLET (50 MG TOTAL) BY MOUTH TWICE  DAILY.) 90 tablet 1 09/26/2015 at 800A  . Calcium Carbonate Antacid (TUMS ULTRA 1000 PO) Take 2 tablets by mouth daily as needed (indigestion).    Past Week at Unknown time  . cloNIDine (CATAPRES) 0.2 MG tablet Take 1 tablet (0.2 mg total) by mouth 2 (two) times daily. 60 tablet 3 09/26/2015 at Unknown time  . furosemide (LASIX) 40 MG tablet TAKE 1 TABLET (40 MG TOTAL) BY MOUTH AS NEEDED. (Patient taking differently: take '40mg'$ s 3 times daily as needed for swelling) 30 tablet 5 09/26/2015 at Unknown time  . gabapentin (NEURONTIN) 300 MG capsule Take 1 capsule (300 mg total) by mouth 3 (three) times daily. (Patient taking differently: Take 300 mg by mouth daily as needed (nerve pain). ) 270 capsule 3 Past Week at Unknown time  . glipiZIDE (GLUCOTROL) 10 MG tablet Take 0.5  tablets (5 mg total) by mouth daily before breakfast. Takes one tablet in the Am and 1/2 tablet in the pm (Patient taking differently: Take 5 mg by mouth daily as needed (high blood sugar). ) 135 tablet 0 09/25/2015  . ondansetron (ZOFRAN) 4 MG tablet Take 1 tablet (4 mg total) by mouth 4 (four) times daily -  before meals and at bedtime. 120 tablet 1 UNKNOWN  . pantoprazole (PROTONIX) 40 MG tablet TAKE 1 TABLET BY MOUTH TWICE A DAY BEFORE MEAL 180 tablet 1 09/26/2015 at Unknown time  . polyethylene glycol (MIRALAX / GLYCOLAX) packet Take 17 g by mouth daily as needed for mild constipation (*tAKES [place]).    09/26/2015 at Unknown time  . silver sulfADIAZINE (SILVADENE) 1 % cream APPLY TO AFFECTED AREA TWICE A DAY (Patient taking differently: Apply 1  application topically 2 (two) times daily as needed (sore skin). ) 240 g 1 UNKNOWN  . valsartan (DIOVAN) 160 MG tablet Take 1 tablet (160 mg total) by mouth daily. 30 tablet 5 09/26/2015 at Unknown time  . Vitamin D, Ergocalciferol, (DRISDOL) 50000 units CAPS capsule Take 50,000 Units by mouth once a week. Takes on Saturdays.  0 09/20/2015 at Unknown time  . doxycycline (VIBRA-TABS) 100 MG tablet Take 1 tablet (100 mg total) by mouth 2 (two) times daily. 1 po bid (Patient not taking: Reported on 09/26/2015) 20 tablet 0   . HYDROcodone-acetaminophen (NORCO/VICODIN) 5-325 MG tablet Take 1 tablet by mouth every 8 (eight) hours as needed. (Patient not taking: Reported on 09/26/2015) 30 tablet 0 unknown   Scheduled: . amLODipine  10 mg Oral Daily  . atenolol  100 mg Oral Daily  . cloNIDine  0.2 mg Oral BID  . enoxaparin (LOVENOX) injection  30 mg Subcutaneous Q24H  . insulin aspart  0-9 Units Subcutaneous TID WC  . irbesartan  150 mg Oral Daily  . [START ON 09/29/2015] levofloxacin (LEVAQUIN) IV  500 mg Intravenous Q48H  . nicotine  21 mg Transdermal Daily  . pantoprazole  40 mg Oral Daily  . polyethylene glycol  17 g Oral BID  . traZODone  50 mg Oral QHS   Continuous: . sodium chloride 10 mL/hr at 09/27/15 0000   BBC:[id number], albuterol, gabapentin, morphine injection, ondansetron **OR** ondansetron (ZOFRAN) IV  Assesment: He has a lung mass. He has what appears to be obstructive pneumonia. He has multiple other medical problems. Active Problems:   TOBACCO USER   Chronic renal disease, stage 5, glomerular filtration rate less than or equal to 15 mL/min/1.73 square meter (HCC)   Amputee, below knee (HCC)   Hyponatremia   Dysphagia   Lung mass   Pneumonia    Plan: He will be sent for bronchoscopy early this week. I'm going to go ahead and make him nothing by mouth after midnight today in case I can get it done tomorrow    LOS: 2 days   [name] 09/28/2015, 10:19 AM

## 2015-09-29 ENCOUNTER — Encounter (HOSPITAL_COMMUNITY)
Admission: EM | Disposition: A | Discharge status: Home / Self Care | Payer: Self-pay | Source: Home / Self Care | Attending: Internal Medicine

## 2015-09-29 ENCOUNTER — Encounter (HOSPITAL_COMMUNITY): Payer: Self-pay | Admitting: *Deleted

## 2015-09-29 ENCOUNTER — Observation Stay (HOSPITAL_COMMUNITY): Payer: Medicare Other

## 2015-09-29 DIAGNOSIS — J449 Chronic obstructive pulmonary disease, unspecified: Secondary | ICD-10-CM | POA: Diagnosis not present

## 2015-09-29 DIAGNOSIS — I509 Heart failure, unspecified: Secondary | ICD-10-CM | POA: Diagnosis not present

## 2015-09-29 DIAGNOSIS — I10 Essential (primary) hypertension: Secondary | ICD-10-CM | POA: Diagnosis not present

## 2015-09-29 DIAGNOSIS — E871 Hypo-osmolality and hyponatremia: Secondary | ICD-10-CM | POA: Diagnosis not present

## 2015-09-29 DIAGNOSIS — R1310 Dysphagia, unspecified: Secondary | ICD-10-CM | POA: Diagnosis not present

## 2015-09-29 DIAGNOSIS — R918 Other nonspecific abnormal finding of lung field: Secondary | ICD-10-CM | POA: Diagnosis not present

## 2015-09-29 DIAGNOSIS — I2510 Atherosclerotic heart disease of native coronary artery without angina pectoris: Secondary | ICD-10-CM | POA: Diagnosis not present

## 2015-09-29 HISTORY — PX: BRONCHIAL BRUSHINGS: SHX5108

## 2015-09-29 HISTORY — PX: FLEXIBLE BRONCHOSCOPY: SHX5094

## 2015-09-29 HISTORY — PX: BRONCHIAL WASHINGS: SHX5105

## 2015-09-29 LAB — BASIC METABOLIC PANEL
ANION GAP: 11 (ref 5–15)
BUN: 77 mg/dL — AB (ref 6–20)
CHLORIDE: 98 mmol/L — AB (ref 101–111)
CO2: 28 mmol/L (ref 22–32)
Calcium: 9 mg/dL (ref 8.9–10.3)
Creatinine, Ser: 4.73 mg/dL — ABNORMAL HIGH (ref 0.61–1.24)
GFR calc non Af Amer: 12 mL/min — ABNORMAL LOW (ref >60)
GFR, EST AFRICAN AMERICAN: 14 mL/min — AB (ref >60)
Glucose, Bld: 144 mg/dL — ABNORMAL HIGH (ref 65–99)
POTASSIUM: 4.8 mmol/L (ref 3.5–5.1)
Sodium: 137 mmol/L (ref 135–145)

## 2015-09-29 LAB — GLUCOSE, CAPILLARY
GLUCOSE-CAPILLARY: 144 mg/dL — AB (ref 65–99)
GLUCOSE-CAPILLARY: 143 mg/dL — AB (ref 65–99)
Glucose-Capillary: 156 mg/dL — ABNORMAL HIGH (ref 65–99)
Glucose-Capillary: 151 mg/dL — ABNORMAL HIGH (ref 65–99)

## 2015-09-29 LAB — HEMOGLOBIN A1C
Hgb A1c MFr Bld: 5.9 % — ABNORMAL HIGH (ref 4.8–5.6)
Mean Plasma Glucose: 123 mg/dL

## 2015-09-29 SURGERY — BRONCHOSCOPY, FLEXIBLE
Anesthesia: General

## 2015-09-29 MED ORDER — BUTAMBEN-TETRACAINE-BENZOCAINE 2-2-14 % EX AERO
INHALATION_SPRAY | CUTANEOUS | Status: DC | PRN
  Administered 2015-09-29: 2 via TOPICAL

## 2015-09-29 MED ORDER — FENTANYL CITRATE (PF) 100 MCG/2ML IJ SOLN
INTRAMUSCULAR | Status: DC | PRN
  Administered 2015-09-29: 25 ug via INTRAVENOUS

## 2015-09-29 MED ORDER — FENTANYL CITRATE (PF) 100 MCG/2ML IJ SOLN
INTRAMUSCULAR | Status: AC
  Filled 2015-09-29: qty 2

## 2015-09-29 MED ORDER — SUCCINYLCHOLINE CHLORIDE 20 MG/ML IJ SOLN
INTRAMUSCULAR | Status: AC
  Filled 2015-09-29: qty 1

## 2015-09-29 MED ORDER — LACTATED RINGERS IV SOLN
INTRAVENOUS | Status: DC | PRN
  Administered 2015-09-29: 09:00:00 via INTRAVENOUS

## 2015-09-29 MED ORDER — PROPOFOL 10 MG/ML IV BOLUS
INTRAVENOUS | Status: DC | PRN
  Administered 2015-09-29: 120 mg via INTRAVENOUS

## 2015-09-29 MED ORDER — SUCCINYLCHOLINE CHLORIDE 20 MG/ML IJ SOLN
INTRAMUSCULAR | Status: DC | PRN
  Administered 2015-09-29: 120 mg via INTRAVENOUS

## 2015-09-29 MED ORDER — MIDAZOLAM HCL 5 MG/5ML IJ SOLN
INTRAMUSCULAR | Status: AC
  Filled 2015-09-29: qty 5

## 2015-09-29 MED ORDER — ROCURONIUM BROMIDE 50 MG/5ML IV SOLN
INTRAVENOUS | Status: AC
  Filled 2015-09-29: qty 1

## 2015-09-29 MED ORDER — LIDOCAINE HCL (PF) 1 % IJ SOLN
INTRAMUSCULAR | Status: AC
  Filled 2015-09-29: qty 5

## 2015-09-29 MED ORDER — LIDOCAINE HCL (CARDIAC) 10 MG/ML IV SOLN
INTRAVENOUS | Status: DC | PRN
  Administered 2015-09-29: 50 mg via INTRAVENOUS

## 2015-09-29 MED ORDER — ARTIFICIAL TEARS OP OINT
TOPICAL_OINTMENT | OPHTHALMIC | Status: AC
  Filled 2015-09-29: qty 3.5

## 2015-09-29 MED ORDER — PROPOFOL 10 MG/ML IV BOLUS
INTRAVENOUS | Status: AC
  Filled 2015-09-29: qty 20

## 2015-09-29 MED ORDER — PHENYLEPHRINE 40 MCG/ML (10ML) SYRINGE FOR IV PUSH (FOR BLOOD PRESSURE SUPPORT)
PREFILLED_SYRINGE | INTRAVENOUS | Status: AC
  Filled 2015-09-29: qty 10

## 2015-09-29 MED ORDER — LIDOCAINE HCL (PF) 2 % IJ SOLN
INTRAMUSCULAR | Status: AC
  Filled 2015-09-29: qty 20

## 2015-09-29 MED ORDER — MIDAZOLAM HCL 10 MG/2ML IJ SOLN
INTRAMUSCULAR | Status: DC | PRN
  Administered 2015-09-29: 2 mg via INTRAVENOUS
  Administered 2015-09-29: 1 mg via INTRAVENOUS
  Administered 2015-09-29: 2 mg via INTRAVENOUS

## 2015-09-29 MED ORDER — LIDOCAINE VISCOUS 2 % MT SOLN
OROMUCOSAL | Status: DC | PRN
Start: 2015-09-29 — End: 2015-09-29
  Administered 2015-09-29: 2 via OROMUCOSAL

## 2015-09-29 MED ORDER — SODIUM CHLORIDE 0.9% FLUSH
INTRAVENOUS | Status: AC
  Filled 2015-09-29: qty 10

## 2015-09-29 MED ORDER — LIDOCAINE VISCOUS 2 % MT SOLN
OROMUCOSAL | Status: AC
  Filled 2015-09-29: qty 15

## 2015-09-29 MED ORDER — PHENYLEPHRINE HCL 10 MG/ML IJ SOLN
INTRAMUSCULAR | Status: DC | PRN
  Administered 2015-09-29 (×2): 80 ug via INTRAVENOUS

## 2015-09-29 SURGICAL SUPPLY — 16 items
BRUSH CYTOL CELLEBRITY 1.5X140 (MISCELLANEOUS) ×4 IMPLANT
CLOTH BEACON ORANGE TIMEOUT ST (SAFETY) ×4 IMPLANT
CONNECTOR 5 IN 1 STRAIGHT STRL (MISCELLANEOUS) ×4 IMPLANT
FORCEPS BIOP RJ4 1.8 (CUTTING FORCEPS) ×4 IMPLANT
GLOVE BIO SURGEON STRL SZ7.5 (GLOVE) ×4 IMPLANT
KIT CLEAN CATCH URINE (SET/KITS/TRAYS/PACK) IMPLANT
MARKER SKIN DUAL TIP RULER LAB (MISCELLANEOUS) ×4 IMPLANT
NS IRRIG 1000ML POUR BTL (IV SOLUTION) ×4 IMPLANT
SPONGE GAUZE 4X4 12PLY (GAUZE/BANDAGES/DRESSINGS) ×4 IMPLANT
SYR 20CC LL (SYRINGE) ×4 IMPLANT
SYR 30ML LL (SYRINGE) ×4 IMPLANT
SYR CONTROL 10ML LL (SYRINGE) ×4 IMPLANT
TRAP SPECIMEN CP (MISCELLANEOUS) ×4 IMPLANT
VALVE DISPOSABLE (MISCELLANEOUS) ×4 IMPLANT
WATER STERILE IRR 1000ML POUR (IV SOLUTION) ×4 IMPLANT
YANKAUER SUCT BULB TIP 10FT TU (MISCELLANEOUS) ×8 IMPLANT

## 2015-09-29 NOTE — Evaluation (Addendum)
Clinical/Bedside Swallow Evaluation Patient Details  Name: OTILIO GROLEAU MRN: [id number] Date of Birth: 1950-09-25  Today's Date: 09/29/2015 Time: SLP Start Time (ACUTE ONLY): 64 SLP Stop Time (ACUTE ONLY): 1750 SLP Time Calculation (min) (ACUTE ONLY): 30 min  Past Medical History:  Past Medical History  Diagnosis Date  . Diverticulosis 03/04/10  . Peripheral vascular disease, unspecified ([place])   . Undiagnosed cardiac murmurs   . Other symptoms involving cardiovascular system   . HLD (hyperlipidemia)   . HTN (hypertension)   . Type II or unspecified type diabetes mellitus without mention of complication, not stated as uncontrolled   . Ulcer   . Carotid artery occlusion   . Umbilical hernia now    has not been repaired  . Blood transfusion   . AVM (arteriovenous malformation) of colon with hemorrhage   . Gastritis   . Internal hemorrhoids   . Right carotid bruit   . Shortness of breath     noted /w low Hgb  . GERD (gastroesophageal reflux disease)   . Chronic kidney disease     renal failure /w osteomyelitis- 2010  . Arthritis     back  . Anemia     8/3,4,5- blood transfusion- [facility], colonoscopy- done & found 2 areas of bleeding   . Tobacco use disorder   . Anxiety   . CHF (congestive heart failure) ([place])     3 yrs ago  . Irregular heart beat   . Iron deficiency anemia due to chronic blood loss 10/07/2010  . Chronic renal disease, stage 5, glomerular filtration rate less than or equal to 15 mL/min/1.73 square meter ([place]) 10/07/2010   Past Surgical History:  Past Surgical History  Procedure Laterality Date  . Colonoscopy  03/04/2010    Dr. Princella Pellegrini AMV without mention of ablation, scattered diverticula, internal hemorrhoids  . Bilateral common superficial femoral and profudus artery endarterectomies      2003    Dr. Sherren Mocha Early  . Esophagogastroduodenoscopy  03/2000    Dr. Tharon Aquas gastritis, H.Pylori gastritis, ?treated  . Esophagogastroduodenoscopy   05/2006    Dr. Marcello Fennel recurrent GI bleed. antral gastritis, single gastric AVM s/p APC, CLO results?  . Colonoscopy  11/2004    Dr. Barkley Bruns  . 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 Both of these are ablated with argon plasma coagulator./ Small external hemorrhoids  . Esophagogastroduodenoscopy  01/21/2012    SLF: MILD TO MAODERATE Gastritis/ SLOW GIB LIKEY DUE TO PT BEING ON ASA ND SUBSEQUENT BLOOD/ LOSS FROM AVMS IN STOMACH AND COLON AS WELL AS GASTRITIS  . Enteroscopy  01/21/2012    Procedure: ENTEROSCOPY;  Surgeon: Danie Binder, MD;  [facility];  Service: Endoscopy;;  . No past surgeries    . Vascular surgery      rt bka and rt 1st toe amputation  . Below knee leg amputation      2010, wears prosthesis   . Endarterectomy  02/09/2012    Procedure: ENDARTERECTOMY CAROTID;  Surgeon: Rosetta Posner, MD;  Location: [facility];  Service: Vascular;  Laterality: Left;  . Carotid endarterectomy Left 02-09-12  . Esophagogastroduodenoscopy N/A 09/03/2013    Mild chronic gastritis. benign gastric polyps  . Givens capsule study N/A 09/03/2013    Dr. Oneida Alar: small bowel and colonic AVMs. no active bleeding  . Esophagogastroduodenoscopy N/A  08/19/2015    Dr. Oneida Alar: 2 large gastric AVMs in fundus, 1 actively bleeding s/p APC and clip placement, empiric Savary dilation  . Savory dilation N/A 08/19/2015    Procedure: SAVORY DILATION;  Surgeon: Danie Binder, MD;  Location[facility];  Service: Endoscopy;  Laterality: N/A;   HPI:  Patient is a 65 y/o presenting to the hospital with epigastric pain; he has also been complaining of difficulty swallowing, for past 2 months. Patient underwent EGD in February this year which showed nonerosive gastritis and duodenal AVMs. He has also been following cancer [facility] for getting IV iron and blood  transfusion for anemia from AVMs. CT abdomen pelvis done in the ED showed possibility of right lower lobe pneumonia as well as changes consistent with lymphadenopathy in the region of right hilum and adjacent to the right pulmonary artery. CT of the chest was recommended. Patient underwent CT chest which showed extensive malignancy in the thorax. Large right hilar/mediastinal masslike soft tissue density causing encasement and narrowing of the right mainstem bronchus and bronchus intermedius.   Assessment / Plan / Recommendation Clinical Impression  Pt was evaluated at bedside for PO trials; note pt's edentulous status. Patient and wife report pt's difficulty swallowing onset approximately 2 months ago and that he can only eat puree textures that are "watery". Pt however consumed thin liquids, one bite of mech soft with liquid wash to clear residuals and soften texture, and pudding with no overt observed signs or symptoms of aspiration. Based on patient report of solids "not going down" and liquid wash being effective in clearance of textures (and no oropharyngeal s/sx noted) question deglutition difficulty lying below the level of the upper esophageal sphinctor (UES). Note recent Chest CT revealing "large right mass like soft tissue density causing encasement and narrowing of the right mainstem bronchus and bronchus intermedius". Recommend puree textures with addition of soups at lunch and supper (to be mixed to facilitate PO intake with soft/thin textures) and thin liquids. Recommend pt's posture remain at 90 degrees for 45 minutes after all meals, alternate bites and sips. Meds to be administered one at a time with liquid wash in between pills. ST to follow for diet tolerance while in acute setting.    Aspiration Risk  Mild aspiration risk    Diet Recommendation     Liquid Administration via: Straw;Cup Medication Administration: Whole meds with liquid Supervision: Patient able to self  feed Compensations: Slow rate;Small sips/bites;Follow solids with liquid;Multiple dry swallows after each bite/sip Postural Changes: Seated upright at 90 degrees    Other  Recommendations Oral Care Recommendations: Oral care BID   Follow up Recommendations       Frequency and Duration min 2x/week  2 weeks       Prognosis Prognosis for Safe Diet Advancement: Fair Barriers to Reach Goals: Severity of deficits      Swallow Study   General Date of Onset: 09/26/15 HPI: Patient is a 65 y/o presenting to the hospital with epigastric pain; he has also been complaining of difficulty swallowing, for past 2 months. Patient underwent EGD in February this year which showed nonerosive gastritis and duodenal AVMs. He has also been following cancer Center for getting IV iron and blood transfusion for anemia from AVMs. CT abdomen pelvis done in the ED showed possibility of right lower lobe pneumonia as well as changes consistent with lymphadenopathy in the region of right hilum and adjacent to the right pulmonary artery. CT of the chest was recommended. Patient  underwent CT chest which showed extensive malignancy in the thorax. Large right hilar/mediastinal masslike soft tissue density causing encasement and narrowing of the right mainstem bronchus and bronchus intermedius. Type of Study: Bedside Swallow Evaluation Diet Prior to this Study: Dysphagia 1 (puree);Thin liquids Temperature Spikes Noted: No Respiratory Status: Nasal cannula History of Recent Intubation: Yes Length of Intubations (days): 1 days Date extubated: 09/29/15 Behavior/Cognition: Alert;Cooperative;Pleasant mood Oral Cavity Assessment: Within Functional Limits Oral Care Completed by SLP: No Oral Cavity - Dentition: Edentulous Self-Feeding Abilities: Able to feed self Baseline Vocal Quality: Hoarse;Low vocal intensity Volitional Cough: Strong Volitional Swallow: Able to elicit    Oral/Motor/Sensory Function Overall Oral  Motor/Sensory Function: Within functional limits   Ice Chips Ice chips: Within functional limits   Thin Liquid Thin Liquid: Within functional limits    Nectar Thick Nectar Thick Liquid: Not tested   Honey Thick Honey Thick Liquid: Not tested   Puree Puree: Within functional limits   Solid  G-Code: Swallowing, Clinical Judgement Initial: CI Goal: CI Discharge: CI  Kylil Swopes H. Roddie Mc, CCC-SLP Speech Language Pathologist    Solid: Within functional limits        Wende Bushy 09/29/2015,6:08 PM

## 2015-09-29 NOTE — Anesthesia Postprocedure Evaluation (Signed)
Anesthesia Post Note  Patient: [name]  Procedure(s) Performed: Procedure(s) (LRB): FLEXIBLE BRONCHOSCOPY (N/A) BRONCHIAL BRUSHINGS BRONCHIAL WASHINGS  Patient location during evaluation: PACU Anesthesia Type: General Level of consciousness: awake and alert Pain management: satisfactory to patient Vital Signs Assessment: post-procedure vital signs reviewed and stable Respiratory status: spontaneous breathing and non-rebreather facemask Cardiovascular status: stable Anesthetic complications: no    Last Vitals:  Filed Vitals:   09/29/15 0900 09/29/15 0950  BP: 145/74   Pulse: 88   Temp:  36.5 C  Resp: 18     Last Pain:  Filed Vitals:   09/29/15 1007  PainSc: 3                  Kyley Solow

## 2015-09-29 NOTE — Anesthesia Preprocedure Evaluation (Addendum)
Anesthesia Evaluation  Patient identified by MRN, date of birth, ID band Patient awake    Reviewed: Allergy & Precautions, H&P , NPO status , Patient's Chart, lab work & pertinent test results  History of Anesthesia Complications Negative for: history of anesthetic complications  Airway Mallampati: II  TM Distance: >3 FB Neck ROM: full    Dental  (+) Edentulous Lower, Edentulous Upper, Dental Advisory Given   Pulmonary shortness of breath, Current Smoker,           Cardiovascular hypertension, Pt. on medications + CAD and +CHF  + Valvular Problems/Murmurs      Neuro/Psych  Neuromuscular disease    GI/Hepatic Neg liver ROS, GERD  ,  Endo/Other  diabetes, Type 2, Oral Hypoglycemic Agents  Renal/GU Renal InsufficiencyRenal disease     Musculoskeletal   Abdominal   Peds  Hematology   Anesthesia Other Findings   Reproductive/Obstetrics                           Anesthesia Physical Anesthesia Plan  ASA: III and emergent  Anesthesia Plan: General   Post-op Pain Management:    Induction: Intravenous  Airway Management Planned: Oral ETT  Additional Equipment: Arterial line  Intra-op Plan:   Post-operative Plan: Extubation in OR  Informed Consent: I have reviewed the patients History and Physical, chart, labs and discussed the procedure including the risks, benefits and alternatives for the proposed anesthesia with the patient or authorized representative who has indicated his/her understanding and acceptance.     Plan Discussed with: CRNA and Surgeon  Anesthesia Plan Comments: (Failed conscious sedation - convert to emergent GOT-RSI to control secretions.)       Anesthesia Quick Evaluation

## 2015-09-29 NOTE — Transfer of Care (Signed)
Immediate Anesthesia Transfer of Care Note  Patient: [name]  Procedure(s) Performed: Procedure(s) with comments: FLEXIBLE BRONCHOSCOPY (N/A) BRONCHIAL BRUSHINGS - Tracheal and left lung BRONCHIAL WASHINGS - Bilatersl washings  Patient Location: PACU  Anesthesia Type:General  Level of Consciousness: awake and patient cooperative  Airway & Oxygen Therapy: Patient Spontanous Breathing and non-rebreather face mask  Post-op Assessment: Report given to RN, Post -op Vital signs reviewed and stable and Patient moving all extremities  Post vital signs: Reviewed and stable    Complications: No apparent anesthesia complications

## 2015-09-29 NOTE — Op Note (Signed)
Bronchoscopy Procedure Note [name] [id number] Dec 30, 1950  Procedure: Bronchoscopy Indications: Obtain specimens for culture and/or other diagnostic studies  Procedure Details Consent: Risks of procedure as well as the alternatives and risks of each were explained to the (patient/caregiver).  Consent for procedure obtained. Time Out: Verified patient identification, verified procedure, site/side was marked, verified correct patient position, special equipment/implants available, medications/allergies/relevent history reviewed, required imaging and test results available.  Performed  In preparation for procedure, patient was given 100% FiO2, bronchoscope lubricated and lidocaine given via ETT (0 ml). Sedation: Propofol  Airway entered and the following bronchi were examined: RUL, RML, RLL, LUL and LLL.   Procedures performed: Brushings performed Bronchoscope removed.    Evaluation Hemodynamic Status: BP stable throughout; O2 sats: stable throughout Patient's Current Condition: stable Specimens:  Bronchial washings and brushings sent for cytology Complications: No apparent complications Patient did tolerate procedure well.  Taken to recovery room in good condition. Anticipate he will be able to be extubated but with his COPD he may not [name] L 09/29/2015

## 2015-09-29 NOTE — Anesthesia Procedure Notes (Signed)
Procedure Name: Intubation Date/Time: 09/29/2015 9:12 AM Performed by: Vista Deck Pre-anesthesia Checklist: Patient identified, Patient being monitored, Timeout performed, Emergency Drugs available and Suction available Patient Re-evaluated:Patient Re-evaluated prior to inductionOxygen Delivery Method: Circle System Utilized Preoxygenation: Pre-oxygenation with 100% oxygen Intubation Type: IV induction, Rapid sequence and Cricoid Pressure applied Laryngoscope Size: Mac and 3 Grade View: Grade I Tube type: Oral Tube size: 8.0 mm Number of attempts: 1 Airway Equipment and Method: Stylet Placement Confirmation: ETT inserted through vocal cords under direct vision,  positive ETCO2 and breath sounds checked- equal and bilateral Secured at: 22 cm Tube secured with: Tape Dental Injury: Teeth and Oropharynx as per pre-operative assessment

## 2015-09-29 NOTE — Addendum Note (Signed)
Addendum  created 09/29/15 1115 by Vista Deck, CRNA   Modules edited: Anesthesia Events, Narrator   Narrator:  Narrator: Event Log Edited

## 2015-09-29 NOTE — Addendum Note (Signed)
Addendum  created 09/29/15 1548 by Vista Deck, CRNA   Modules edited: Notes Section   Notes Section:  File: [id number]

## 2015-09-29 NOTE — Op Note (Signed)
Bronchoscopy with local was attempted but I could not enter through his nostrils. He developed a nosebleed. Oral bronchoscopy was attempted but I could not get to his vocal cords. He had some hypoxia so the procedure was terminated. Discussed with his family and plans are now to intubate with propofol anesthesia go ahead and get the procedure done.

## 2015-09-29 NOTE — Progress Notes (Signed)
[facility] PROGRESS NOTE  [name] JOI:[id number] DOB: Jan 16, 1951 DOA: 09/26/2015 PCP: Chevis Pretty, FNP  Assessment/Plan: Large lung mass -Likely represents lung cancer. -Bronchoscopy performed today. -Will need follow-up with oncology once tissue diagnosis is obtained.  Postobstructive pneumonia -Agree with continuation of Levaquin (change to PO). -We'll treat for 8 days. -culture data pending.  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  Dysphagia -Likely from thoracic mass, continue diet as tolerated (can downgrade if patient unable to tolerate solid diet). -Await speech therapy evaluation.  Hyponatremia -Sodium remains low at 130 to 132. Suspect this is related to lung cancer.  Code Status: Full code Family Communication: Wife at bedside updated on plan of care and all questions answered  Disposition Plan: To be determined   Consultants:  Pulmonology, Dr. Luan Pulling   Antibiotics:  Levaquin   Subjective: Tired, fatigued, appears depressed with flat affect  Objective: Filed Vitals:   09/29/15 1015 09/29/15 1020 09/29/15 1030 09/29/15 1428  BP: 141/105  103/88 123/55  Pulse: 98 99 85 74  Temp:    97.7 F (36.5 C)  TempSrc:    Oral  Resp: '23 23 19 18  '$ Height:      Weight:      SpO2: 94% 95% 93% 99%    Intake/Output Summary (Last 24 hours) at 09/29/15 1825 Last data filed at 09/29/15 1738  Gross per 24 hour  Intake    720 ml  Output     25 ml  Net    695 ml   Filed Weights   09/26/15 1727 09/27/15 0015  Weight: 68.947 kg (152 lb) 68.947 kg (152 lb)    Exam:   General:  Alert, awake, oriented 3  Cardiovascular: Regular rate and rhythm  Respiratory: Clear to auscultation bilaterally  Abdomen: Soft, nontender, nondistended, positive bowel sounds  Extremities: No clubbing, cyanosis or edema, positive pulses ; 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  Neurologic:   Grossly intact and nonfocal  Data Reviewed: Basic Metabolic Panel:  Recent Labs Lab 09/26/15 1731 09/27/15 0731 09/29/15 0538  NA 130* 132* 137  K 5.1 4.8 4.8  CL 94* 98* 98*  CO2 '27 26 28  '$ GLUCOSE 158* 148* 144*  BUN 77* 73* 77*  CREATININE 4.05* 4.09* 4.73*  CALCIUM 8.4* 8.4* 9.0   Liver Function Tests:  Recent Labs Lab 09/26/15 1731 09/27/15 0731  AST 23 20  ALT 18 17  ALKPHOS 97 81  BILITOT 0.4 0.6  PROT 6.9 6.9  ALBUMIN 3.4* 3.3*    Recent Labs Lab 09/26/15 1731  LIPASE 91*   No results for input(s): AMMONIA in the last 168 hours. CBC:  Recent Labs Lab 09/26/15 1731 09/27/15 0731  WBC 9.5 11.4*  HGB 11.0* 10.2*  HCT 32.8* 30.3*  MCV 91.1 91.5  PLT 262 237   Cardiac Enzymes: No results for input(s): CKTOTAL, CKMB, CKMBINDEX, TROPONINI in the last 168 hours. BNP (last 3 results) No results for input(s): BNP in the last 8760 hours.  ProBNP (last 3 results) No results for input(s): PROBNP in the last 8760 hours.  CBG:  Recent Labs Lab 09/28/15 1705 09/28/15 2119 09/29/15 1014 09/29/15 1150 09/29/15 1642  GLUCAP 165* 161* 156* 151* 144*    Recent Results (from the past 240 hour(s))  Culture, blood (Routine X 2) w Reflex to ID Panel     Status: None (Preliminary result)   Collection Time: 09/27/15  1:09 AM  Result Value Ref Range Status   Specimen Description RIGHT ANTECUBITAL  Final   Special Requests BOTTLES DRAWN AEROBIC ONLY 6CC  Final   Culture NO GROWTH 2 DAYS  Final   Report Status PENDING  Incomplete  Culture, blood (Routine X 2) w Reflex to ID Panel     Status: None (Preliminary result)   Collection Time: 09/27/15  1:27 AM  Result Value Ref Range Status   Specimen Description BLOOD RIGHT ARM  Final   Special Requests BOTTLES DRAWN AEROBIC ONLY 6CC  Final   Culture NO GROWTH 2 DAYS  Final   Report Status PENDING  Incomplete     Studies: No results found.  Scheduled Meds: . amLODipine  10 mg Oral Daily  . atenolol   100 mg Oral Daily  . cloNIDine  0.2 mg Oral BID  . enoxaparin (LOVENOX) injection  30 mg Subcutaneous Q24H  . insulin aspart  0-9 Units Subcutaneous TID WC  . irbesartan  150 mg Oral Daily  . levofloxacin  500 mg Oral Q48H  . nicotine  21 mg Transdermal Daily  . pantoprazole  40 mg Oral Daily  . polyethylene glycol  17 g Oral BID  . traZODone  50 mg Oral QHS   Continuous Infusions: . sodium chloride 10 mL/hr at 09/27/15 0000    Active Problems:   TOBACCO USER   Chronic renal disease, stage 5, glomerular filtration rate less than or equal to 15 mL/min/1.73 square meter (HCC)   Amputee, below knee (HCC)   Hyponatremia   Dysphagia   Lung mass   Pneumonia    Time spent: 25 minutes. Greater than 50% of this time was spent in direct contact with the patient coordinating care.    Lelon Frohlich  [facility] Pager [phone]  If 7PM-7AM, please contact night-coverage at [email or web address], password [facility] 09/29/2015, 6:25 PM  LOS: 3 days

## 2015-09-29 NOTE — Anesthesia Postprocedure Evaluation (Signed)
Anesthesia Post Note  Patient: [name]  Procedure(s) Performed: Procedure(s) (LRB): FLEXIBLE BRONCHOSCOPY (N/A) BRONCHIAL BRUSHINGS BRONCHIAL WASHINGS  Patient location during evaluation: Nursing Unit Anesthesia Type: General Level of consciousness: awake and alert Pain management: satisfactory to patient Vital Signs Assessment: post-procedure vital signs reviewed and stable Respiratory status: spontaneous breathing and patient connected to nasal cannula oxygen Cardiovascular status: stable Anesthetic complications: no    Last Vitals:  Filed Vitals:   09/29/15 1030 09/29/15 1428  BP: 103/88 123/55  Pulse: 85 74  Temp:  36.5 C  Resp: 19 18    Last Pain:  Filed Vitals:   09/29/15 1429  PainSc: 3                  Sarajean Dessert

## 2015-09-30 DIAGNOSIS — J449 Chronic obstructive pulmonary disease, unspecified: Secondary | ICD-10-CM | POA: Diagnosis not present

## 2015-09-30 DIAGNOSIS — N185 Chronic kidney disease, stage 5: Secondary | ICD-10-CM | POA: Diagnosis not present

## 2015-09-30 DIAGNOSIS — R918 Other nonspecific abnormal finding of lung field: Secondary | ICD-10-CM | POA: Diagnosis not present

## 2015-09-30 DIAGNOSIS — E871 Hypo-osmolality and hyponatremia: Secondary | ICD-10-CM | POA: Diagnosis not present

## 2015-09-30 LAB — GLUCOSE, CAPILLARY
GLUCOSE-CAPILLARY: 166 mg/dL — AB (ref 65–99)
Glucose-Capillary: 136 mg/dL — ABNORMAL HIGH (ref 65–99)

## 2015-09-30 MED ORDER — HYDROCODONE-ACETAMINOPHEN 5-325 MG PO TABS: 1.0000 | ORAL_TABLET | Freq: Three times a day (TID) | ORAL | Status: DC | PRN

## 2015-09-30 MED ORDER — LEVOFLOXACIN 500 MG PO TABS: 500.0000 mg | ORAL_TABLET | ORAL | Status: DC

## 2015-09-30 NOTE — Assessment & Plan Note (Signed)
CT imaging on 09/26/2015 demonstrates large right hilar/mediastinal mass, mediastinal lymphadenopathy, and two spiculated LUL nodules about 2 cm in size each.  Patient is S/P bronchoscopy on 09/29/2015 by Dr. Luan Pulling that was nondiagnostic.   Imaging is very suspicious for malignancy.  Referral to CTS for biopsy.  Will set-up an outpatient PET scan for staging.  Historically, it has been more difficult to get PET scans approved prior to tissue diagnosis, but if imaging test goes to a peer-to-peer review, I will work on getting the imaging test approved as it is necessary for staging purposes..  Based upon pathology results, patient will need MRI imaging of brain during the work-up process, but at this time, this imaging test does not take precedence as it will not alter his immediate treatment course.  Return in 10-14 days for follow-up.

## 2015-09-30 NOTE — Progress Notes (Signed)
Patient cancelled

## 2015-09-30 NOTE — Progress Notes (Signed)
Patient alert and oriented, independent, VSS, pt. Tolerating diet well. No complaints of pain or nausea. Pt. Had IV removed tip intact. Pt. Had prescriptions given. Pt. Voiced understanding of discharge instructions with no further questions. Pt. Discharged via wheelchair with auxilliary.

## 2015-09-30 NOTE — Progress Notes (Signed)
He is doing well after the bronchoscopy. He wants to go home. His chest shows diminished breath sounds bilaterally. I think he is probably okay from a strictly pulmonary point of view to be discharged. I told him it will probably be tomorrow or the next day before we have results from his bronchoscopy. Since I did not see an endobronchial lesion we may not have got a diagnosis. Once I get the results I will tell him what we need to do from there

## 2015-09-30 NOTE — Discharge Summary (Signed)
Physician Discharge Summary  TRUST LEH TGY:[id number] DOB: [birth year] DOA: 09/26/2015  PCP: Chevis Pretty, FNP  Admit date: 09/26/2015 Discharge date: 09/30/2015  Time spent: 45 minutes  Recommendations for Outpatient Follow-up:  -Patient will be discharged home today. -Dr. Luan Pulling will contact him with the results of biopsy to further determine plan of care.   Discharge Diagnoses:  Active Problems:   TOBACCO USER   Chronic renal disease, stage 5, glomerular filtration rate less than or equal to 15 mL/min/1.73 square meter (HCC)   Amputee, below knee (HCC)   Hyponatremia   Dysphagia   Lung mass   Pneumonia   Discharge Condition: Stable and improved  Filed Weights   09/26/15 1727 09/27/15 0015  Weight: 68.947 kg (152 lb) 68.947 kg (152 lb)    History of present illness:  As per Dr. Darrick Meigs on 22/11: 65 year old male who  has a past medical history of Diverticulosis (03/04/10); Peripheral vascular disease, unspecified ([place]); Undiagnosed cardiac murmurs; Other symptoms involving cardiovascular system; HLD (hyperlipidemia); HTN (hypertension); Type II or unspecified type diabetes mellitus without mention of complication, not stated as uncontrolled; Ulcer; Carotid artery occlusion; Umbilical hernia (now); Blood transfusion; AVM (arteriovenous malformation) of colon with hemorrhage; Gastritis; Internal hemorrhoids; Right carotid bruit; Shortness of breath; GERD (gastroesophageal reflux disease); Chronic kidney disease; Arthritis; Anemia; Tobacco use disorder; Anxiety; CHF (congestive heart failure) ([place]); Irregular heart beat; Iron deficiency anemia due to chronic blood loss (10/07/2010); and Chronic renal disease, stage 5, glomerular filtration rate less than or equal to 15 mL/min/1.73 square meter ([place]) (10/07/2010). Today presents to the hospital with epigastric pain. Patient also has been complaining of difficulty swallowing, for past 2 months. Patient underwent EGD in February  this year which showed nonerosive gastritis and duodenal AVMs. He has also been following cancer Center for getting IV iron and blood transfusion for anemia from AVMs. CT abdomen pelvis done in the ED showed possibility of right lower lobe pneumonia as well as changes consistent with lymphadenopathy in the region of right hilum and adjacent to the right pulmonary artery. CT of the chest was recommended. Patient underwent CT chest which showed extensive malignancy in the thorax. Large right hilar/mediastinal masslike soft tissue density causing encasement and narrowing of the right mainstem bronchus and bronchus intermedius. Also question of pneumonia so patient started on vancomycin and Zosyn in the ED for possible pneumonia. Patient has been coughing up phlegm, denies fever or chills. No dysuria. No vomiting or diarrhea. Does complain of retching along with abdominal pain. Patient has been complaining of shortness of breath. In the ED was found to be hypoxic requiring oxygen. O2 sats above 92% on 2 L oxygen via nasal cannula.  Hospital Course:   Large lung mass -Likely represents lung cancer. -Bronchoscopy performed with results pending, Dr. Luan Pulling will notify of results. -Will need follow-up with oncology once tissue diagnosis is obtained.  Postobstructive pneumonia -Agree with continuation of Levaquin (change to PO). -We'll treat for 8 days total. -culture data remains negative.  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  Dysphagia -Likely from thoracic mass, continue diet as tolerated (can downgrade if patient unable to tolerate solid diet). -Await speech therapy evaluation.  Hyponatremia -Sodium remains low at 130 to 132. Suspect this is related to lung cancer.   Procedures:  Bronchoscopy on 4/10   Consultations:  Pulmonology, Dr. Luan Pulling  Discharge Instructions  Discharge Instructions    Increase activity slowly    Complete  by:   As directed             Medication List    STOP taking these medications        doxycycline 100 MG tablet  Commonly known as:  VIBRA-TABS     furosemide 40 MG tablet  Commonly known as:  LASIX      TAKE these medications        acetaminophen 500 MG tablet  Commonly known as:  TYLENOL  Take 1,000 mg by mouth daily as needed for mild pain or headache.     amLODipine 10 MG tablet  Commonly known as:  NORVASC  TAKE 1 TABLET BY MOUTH DAILY, BEFORE BREAKFAST     atenolol 100 MG tablet  Commonly known as:  TENORMIN  TAKE 1 TABLET (100 MG TOTAL) BY MOUTH DAILY.     cloNIDine 0.2 MG tablet  Commonly known as:  CATAPRES  Take 1 tablet (0.2 mg total) by mouth 2 (two) times daily.     gabapentin 300 MG capsule  Commonly known as:  NEURONTIN  Take 1 capsule (300 mg total) by mouth 3 (three) times daily.     glipiZIDE 10 MG tablet  Commonly known as:  GLUCOTROL  Take 0.5 tablets (5 mg total) by mouth daily before breakfast. Takes one tablet in the Am and 1/2 tablet in the pm     HYDROcodone-acetaminophen 5-325 MG tablet  Commonly known as:  NORCO/VICODIN  Take 1 tablet by mouth every 8 (eight) hours as needed.     levofloxacin 500 MG tablet  Commonly known as:  LEVAQUIN  Take 1 tablet (500 mg total) by mouth every other day.     ondansetron 4 MG tablet  Commonly known as:  ZOFRAN  Take 1 tablet (4 mg total) by mouth 4 (four) times daily -  before meals and at bedtime.     pantoprazole 40 MG tablet  Commonly known as:  PROTONIX  TAKE 1 TABLET BY MOUTH TWICE A DAY BEFORE MEAL     polyethylene glycol packet  Commonly known as:  MIRALAX / GLYCOLAX  Take 17 g by mouth daily as needed for mild constipation (*tAKES TWICE DAILY SOMETIMES).     silver sulfADIAZINE 1 % cream  Commonly known as:  SILVADENE  APPLY TO AFFECTED AREA TWICE A DAY     TUMS ULTRA 1000 PO  Take 2 tablets by mouth daily as needed (indigestion).     valsartan 160 MG tablet  Commonly known as:   DIOVAN  Take 1 tablet (160 mg total) by mouth daily.     Vitamin D (Ergocalciferol) 50000 units Caps capsule  Commonly known as:  DRISDOL  Take 50,000 Units by mouth once a week. Takes on Saturdays.       Allergies  Allergen Reactions  . Zocor [Simvastatin] Palpitations    Chest soreness  . Asa [Aspirin] Other (See Comments)    GI Bleed  . Doxycycline Diarrhea  . Hctz [Hydrochlorothiazide]     Leg cramps  . Iodine Hives    IVP dye  . Sulfa Antibiotics Other (See Comments)    Unknown        Follow-up Information    Follow up with Worthy Rancher, MD On 10/08/2015.   Specialties:  Family Medicine, Cardiology   Why:  at 9:25 am   Contact information:   [place] [id number] [phone]        The results of significant diagnostics from  this hospitalization (including imaging, microbiology, ancillary and laboratory) are listed below for reference.    Significant Diagnostic Studies: Ct Abdomen Pelvis Wo Contrast  09/26/2015  CLINICAL DATA:  Abdominal pain with decreased appetite following esophageal dilatation procedure EXAM: CT ABDOMEN AND PELVIS WITHOUT CONTRAST TECHNIQUE: Multidetector CT imaging of the abdomen and pelvis was performed following the standard protocol without IV contrast. COMPARISON:  None. FINDINGS: Lung bases demonstrate evidence of right lower lobe pneumonia. Some fullness is also noted anterior to the right main pulmonary artery which likely represents lymphadenopathy. Dedicated CT of the chest is recommended. The liver, gallbladder, spleen, adrenal glands and pancreas are all normal in their CT appearance. The kidneys are well visualized bilaterally and demonstrate renal vascular calcifications. No renal stones or obstructive changes are seen. The bladder is partially distended. Significant calcifications of the seminal vesicles are noted bilaterally. The vas deferens are also calcified. Aortoiliac calcifications are seen. A small umbilical  hernia is noted containing fat. The appendix is within normal limits. No significant diverticular change is noted. Stomach demonstrates clips consistent with the prior endoscopy procedure. IMPRESSION: Right lower lobe pneumonia as well as changes consistent with lymphadenopathy in the region of the right hilum and adjacent to the right pulmonary artery. CT of the chest is recommended for further evaluation. Chronic changes in the abdomen and pelvis. No acute abnormality is seen. Electronically Signed   By: Inez Catalina M.D.   On: 09/26/2015 20:29   Ct Chest Wo Contrast  09/26/2015  CLINICAL DATA:  Shortness of breath. CT abdomen today demonstrating right lower lobe pneumonia and right hilar adenopathy. EXAM: CT CHEST WITHOUT CONTRAST TECHNIQUE: Multidetector CT imaging of the chest was performed following the standard protocol without IV contrast. COMPARISON:  Included lung bases from CT abdomen earlier today. FINDINGS: Mediastinum/Lymph Nodes: Extensive mediastinal adenopathy and masslike opacity in the right hilar and right mediastinum, with marked paratracheal thickening and circumferential soft tissue density posterior lateral to the mid distal trachea, encasing the trachea at the carina, and causing luminal narrowing of the right mainstem bronchus. Exact measurements are difficult due to the large nature, however measures at least 9.5 cm in greatest dimension. Individual lymph nodes versus pulmonary mass cannot be discretely identified given lack contrast in the conglomerate soft tissue nature. Separate enlarged AP window lymph node measures 1.5 cm short axis with additional superior mediastinal, prevascular, and AP window nodes. There is abnormal left axillary adenopathy. Multiple small right axillary lymph nodes also seen. Mild cardiomegaly. Moderate coronary artery calcifications. Atherosclerosis of the thoracic aorta which is normal in caliber. Physiologic pericardial fluid. Lungs/Pleura: Abnormal right  hilar soft tissue density with spiculation, a discrete right lung nodule or mass is not seen due to the conglomerate nature and lack of intravenous contrast. There luminal narrowing and near complete occlusion of the right mainstem bronchus secondary to bronchial encasement. Mass encases the bronchus intermedius with marked narrowing of the middle and lower lobe bronchi. Volume loss in the right lung with small right pleural effusion. Ill-defined and nodular right lower lobe opaciteis. 2 spiculated nodules in the left upper lobe. More superior nodule measures 2.4 x 1.8 x 2.2 cm with peripheral cavitation. The more inferior nodule measures 2.1 x 1.6 x 2.0 cm. Both nodules have spiculated borders. Upper abdomen: Evaluated earlier this day at with CT abdomen/pelvis. Musculoskeletal: No destructive lytic or discrete blastic osseous lesion. 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 IMPRESSION: 1. 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 Large right hilar/mediastinal  masslike soft tissue density causing encasement and narrowing of the right mainstem bronchus and bronchus intermedius. Discrete mass difficult separate from the extensive multifocal mediastinal adenopathy. The right lower lobe opacities may be postobstructive atelectasis, spread of malignancy or pneumonia. Suspect bronchogenic malignancy, however primary lesion difficult to discern. 2. Two spiculated left upper lobe nodules, both approximately 2 cm, primary versus metastatic disease. These results were called by telephone at the time of interpretation on 09/26/2015 at 10:19 pm to Dr. Milton Ferguson , who verbally acknowledged these results. Electronically Signed   By: Jeb Levering M.D.   On: 09/26/2015 22:20   US Venous Img Upper Uni Left  09/01/2015  CLINICAL DATA:  Left upper extremity swelling for 4-6 weeks. EXAM: LEFT UPPER EXTREMITY VENOUS DOPPLER ULTRASOUND TECHNIQUE: Gray-scale sonography with graded compression,  as well as color Doppler and duplex ultrasound were performed to evaluate the upper extremity deep venous system from the level of the subclavian vein and including the jugular, axillary, basilic and upper cephalic vein. Spectral Doppler was utilized to evaluate flow at rest and with distal augmentation maneuvers. COMPARISON:  None. FINDINGS: Thrombus within deep veins:  None visualized. Normal compressibility, phasicity and color Doppler flow in the left internal jugular vein. Normal compressibility, Doppler flow and augmentation in the left subclavian vein. Normal compressibility, color Doppler flow and augmentation in left axillary vein. Normal compressibility, color Doppler flow and augmentation in the left brachial veins. The left radial and ulnar veins are patent. Normal compressibility, color Doppler flow and augmentation in left cephalic vein. Normal compressibility, color Doppler flow and augmentation in the left basilic vein. Other findings: Normal compressibility and color Doppler flow in the right subclavian vein. IMPRESSION: Negative for deep venous thrombosis in the left upper extremity. Electronically Signed   By: Markus Daft M.D.   On: 09/01/2015 14:52    Microbiology: Recent Results (from the past 240 hour(s))  Culture, blood (Routine X 2) w Reflex to ID Panel     Status: None (Preliminary result)   Collection Time: 09/27/15  1:09 AM  Result Value Ref Range Status   Specimen Description BLOOD RIGHT ANTECUBITAL  Final   Special Requests BOTTLES DRAWN AEROBIC ONLY 6CC  Final   Culture NO GROWTH 3 DAYS  Final   Report Status PENDING  Incomplete  Culture, blood (Routine X 2) w Reflex to ID Panel     Status: None (Preliminary result)   Collection Time: 09/27/15  1:27 AM  Result Value Ref Range Status   Specimen Description BLOOD RIGHT ARM  Final   Special Requests BOTTLES DRAWN AEROBIC ONLY 6CC  Final   Culture NO GROWTH 3 DAYS  Final   Report Status PENDING  Incomplete      Labs: Basic Metabolic Panel:  Recent Labs Lab 09/26/15 1731 09/27/15 0731 09/29/15 0538  NA 130* 132* 137  K 5.1 4.8 4.8  CL 94* 98* 98*  CO2 '27 26 28  '$ GLUCOSE 158* 148* 144*  BUN 77* 73* 77*  CREATININE 4.05* 4.09* 4.73*  CALCIUM 8.4* 8.4* 9.0   Liver Function Tests:  Recent Labs Lab 09/26/15 1731 09/27/15 0731  AST 23 20  ALT 18 17  ALKPHOS 97 81  BILITOT 0.4 0.6  PROT 6.9 6.9  ALBUMIN 3.4* 3.3*    Recent Labs Lab 09/26/15 1731  LIPASE 91*   No results for input(s): AMMONIA in the last 168 hours. CBC:  Recent Labs Lab 09/26/15 1731 09/27/15 0731  WBC 9.5 11.4*  HGB 11.0* 10.2*  HCT 32.8* 30.3*  MCV 91.1 91.5  PLT 262 237   Cardiac Enzymes: No results for input(s): CKTOTAL, CKMB, CKMBINDEX, TROPONINI in the last 168 hours. BNP: BNP (last 3 results) No results for input(s): BNP in the last 8760 hours.  ProBNP (last 3 results) No results for input(s): PROBNP in the last 8760 hours.  CBG:  Recent Labs Lab 09/29/15 1150 09/29/15 1642 09/29/15 2203 09/30/15 0717 09/30/15 1144  GLUCAP 151* 144* 143* 136* 166*       Signed:  HERNANDEZ ACOSTA,ESTELA  [facility] Pager: [phone] 09/30/2015, 2:23 PM

## 2015-09-30 NOTE — Care Management Obs Status (Signed)
[place] NOTIFICATION   Patient Details  Name: [name] MRN: [id number] Date of Birth: 18-Jun-1951   Medicare Observation Status Notification Given:  Yes    Alvie Heidelberg, RN 09/30/2015, 8:25 AM

## 2015-09-30 NOTE — Consult Note (Signed)
   [facility] CM Inpatient Consult   09/30/2015  [name] 1950/08/02 [id number]  Spoke with patient and wife, [name] at bedside regarding [facility] services. Patient does not wish to participate with [facility] at this time. Patient given [facility] brochure and contact information for future reference, he voices appreciation of information.   Inpatient case manager aware that patient refused [facility] case management services.  Of note, [facility] Care Management services would not replace or interfere with any services that are arranged by inpatient case management or social work. For additional questions or referrals please contact:  Royetta Crochet. [name], RN, BSN, [facility] Liaison [phone]

## 2015-10-01 ENCOUNTER — Encounter (HOSPITAL_COMMUNITY): Payer: Medicaid Other | Attending: Hematology & Oncology

## 2015-10-01 ENCOUNTER — Ambulatory Visit (HOSPITAL_COMMUNITY): Payer: Medicare Other | Admitting: Oncology

## 2015-10-01 ENCOUNTER — Other Ambulatory Visit (HOSPITAL_COMMUNITY): Payer: Self-pay | Admitting: Oncology

## 2015-10-01 DIAGNOSIS — D500 Iron deficiency anemia secondary to blood loss (chronic): Secondary | ICD-10-CM

## 2015-10-01 DIAGNOSIS — F17200 Nicotine dependence, unspecified, uncomplicated: Secondary | ICD-10-CM

## 2015-10-01 DIAGNOSIS — R918 Other nonspecific abnormal finding of lung field: Secondary | ICD-10-CM

## 2015-10-01 LAB — CBC WITH DIFFERENTIAL/PLATELET
BASOS ABS: 0 10*3/uL (ref 0.0–0.1)
Basophils Relative: 0 %
EOS ABS: 0.2 10*3/uL (ref 0.0–0.7)
EOS PCT: 3 %
HCT: 29.5 % — ABNORMAL LOW (ref 39.0–52.0)
Hemoglobin: 9.7 g/dL — ABNORMAL LOW (ref 13.0–17.0)
LYMPHS PCT: 5 %
Lymphs Abs: 0.4 10*3/uL — ABNORMAL LOW (ref 0.7–4.0)
MCH: 30.1 pg (ref 26.0–34.0)
MCHC: 32.9 g/dL (ref 30.0–36.0)
MCV: 91.6 fL (ref 78.0–100.0)
MONO ABS: 0.6 10*3/uL (ref 0.1–1.0)
Monocytes Relative: 8 %
Neutro Abs: 6.6 10*3/uL (ref 1.7–7.7)
Neutrophils Relative %: 84 %
PLATELETS: 238 10*3/uL (ref 150–400)
RBC: 3.22 MIL/uL — ABNORMAL LOW (ref 4.22–5.81)
RDW: 15 % (ref 11.5–15.5)
WBC: 7.9 10*3/uL (ref 4.0–10.5)

## 2015-10-01 LAB — FERRITIN: FERRITIN: 246 ng/mL (ref 24–336)

## 2015-10-02 ENCOUNTER — Telehealth: Payer: Self-pay | Admitting: Nurse Practitioner

## 2015-10-02 ENCOUNTER — Other Ambulatory Visit: Payer: Self-pay | Admitting: *Deleted

## 2015-10-02 ENCOUNTER — Other Ambulatory Visit: Payer: Medicare Other

## 2015-10-02 ENCOUNTER — Telehealth: Payer: Self-pay | Admitting: *Deleted

## 2015-10-02 DIAGNOSIS — R0602 Shortness of breath: Secondary | ICD-10-CM

## 2015-10-02 LAB — CULTURE, BLOOD (ROUTINE X 2)
CULTURE: NO GROWTH
Culture: NO GROWTH

## 2015-10-02 NOTE — Telephone Encounter (Signed)
Pt has not had 24hr O2 monitor Please order

## 2015-10-02 NOTE — Telephone Encounter (Signed)
  Call Completed and Appointment Scheduled: Yes, Date 09/30/15 with Chevis Pretty, [place] Date of Discharge:09/30/15  Discharge Facility: [facility]  Principal Discharge Diagnosis: Lung Mass  Patient and/or caregiver is knowledgeable of his/her condition(s) and treatment: Yes  MEDICATION RECONCILIATION Medication list reviewed with patient:Yes Discharge Medications reconciled with home medications.   Patient is able to obtain needed medications:Yes  ACTIVITIES OF DAILY LIVING  Is the patient able to perform his/her own ADLs: Yes.    Patient is receiving home health services: No.  PATIENT EDUCATION  Questions/Concerns Discussed: Patient was not on O2 prior to hospitalization.  He was on O2 while in the hospital but was not discharged with O2. He has a home pulse ox and the lowest has been 86% and the highest is 94%. He has periods of dyspnea and breaths better while sitting up. [facility] was contacted to see if we could order O2 without a walking pulse ox. We did not receive a call back regarding this.  I spoke with his wife and he is stable at the moment. She will continue to monitor his O2 and will take him back to the ER if his sats drop and remain below 90 or if his dyspnea worsens.  His follow up appt was moved up to Monday, April 17th.

## 2015-10-02 NOTE — Telephone Encounter (Signed)
FYI

## 2015-10-02 NOTE — Telephone Encounter (Signed)
hasa he had a 24 hour Sao2 monitoring to warrant o2 at home

## 2015-10-02 NOTE — Telephone Encounter (Signed)
Overnight oximetry ordered in Epic Pt notified Spoke to [facility] to expedite overnight oximetry

## 2015-10-02 NOTE — Telephone Encounter (Signed)
Please review and advise.

## 2015-10-06 ENCOUNTER — Ambulatory Visit (INDEPENDENT_AMBULATORY_CARE_PROVIDER_SITE_OTHER): Payer: Medicare Other | Admitting: Nurse Practitioner

## 2015-10-06 ENCOUNTER — Encounter: Payer: Self-pay | Admitting: Nurse Practitioner

## 2015-10-06 ENCOUNTER — Telehealth: Payer: Self-pay | Admitting: Nurse Practitioner

## 2015-10-06 VITALS — BP 137/69 | HR 88 | Temp 97.1°F | Ht 64.0 in | Wt 153.0 lb

## 2015-10-06 DIAGNOSIS — I5020 Unspecified systolic (congestive) heart failure: Secondary | ICD-10-CM | POA: Diagnosis not present

## 2015-10-06 DIAGNOSIS — R0602 Shortness of breath: Secondary | ICD-10-CM | POA: Diagnosis not present

## 2015-10-06 DIAGNOSIS — R918 Other nonspecific abnormal finding of lung field: Secondary | ICD-10-CM | POA: Diagnosis not present

## 2015-10-06 NOTE — Progress Notes (Signed)
   Subjective:    Patient ID: [name], male    DOB: 1951-05-05, 65 y.o.   MRN: [id number]  HPI Today's visit is for Transitional Care Management.  The patient was discharged from [facility] on 09/30/15 with a primary diagnosis of lung mass and SOB.   Contact with the patient and/or caregiver, by a clinical staff member, was made on 10/02/15 and was documented as a telephone encounter within the EMR.  Through chart review and discussion with the patient I have determined that management of their condition is of moderate complexity.   He was on O2 at hospital but was not discharge on O2. His wife came in last Thursday and said he needs O2. We attempted to have 24 hour pulse ox sent [facility] but they did not send it out to his house- His wife said he struggled with SOB all weekend. She had a pulse ox at home and she said when he is resting he is in low 90'2 but if he gets up to do anything it drops to mid 80's and takes a while to recover back into 90's at rest.     Review of Systems  Constitutional: Negative.   HENT: Negative.   Respiratory: Positive for shortness of breath.   Cardiovascular: Negative for chest pain, palpitations and leg swelling.  Gastrointestinal: Negative.   Genitourinary: Negative.   Neurological: Negative.   Psychiatric/Behavioral: Negative.   All other systems reviewed and are negative.      Objective:   Physical Exam  Constitutional: He is oriented to person, place, and time. He appears well-developed and well-nourished.  Cardiovascular: Normal rate and normal heart sounds.   Pulmonary/Chest: Effort normal.  SOB noted with activity SAO2 @ rest 92% SAO2 with activity dropped to 84% SAO2  on 2l via [place]a with activity up to 88% with 20 minutes of rest to get to above 90% Diminished breath sounds right lung fields  Neurological: He is alert and oriented to person, place, and time.  Skin: Skin is warm.  Psychiatric: He has a normal  mood and affect. His behavior is normal. Judgment and thought content normal.   BP 137/69 mmHg  Pulse 88  Temp(Src) 97.1 F (36.2 C) (Oral)  Ht '5\' 4"'$  (1.626 m)  Wt 153 lb (69.4 kg)  BMI 26.25 kg/m2  SpO2 85%       Assessment & Plan:   1. SOB (shortness of breath)    Ordered home O2 Patient will call if does not here form o2 people by this afternoon Keep follow up appointments  [name] Done, FNP

## 2015-10-06 NOTE — Telephone Encounter (Signed)
Taking care of things

## 2015-10-07 ENCOUNTER — Ambulatory Visit
Admission: RE | Admit: 2015-10-07 | Discharge: 2015-10-07 | Disposition: A | Discharge status: Home / Self Care | Payer: Medicare Other | Source: Ambulatory Visit | Attending: Oncology | Admitting: Oncology

## 2015-10-07 ENCOUNTER — Telehealth: Payer: Self-pay | Admitting: Nurse Practitioner

## 2015-10-07 ENCOUNTER — Other Ambulatory Visit (HOSPITAL_COMMUNITY): Payer: Self-pay | Admitting: Emergency Medicine

## 2015-10-07 ENCOUNTER — Encounter (HOSPITAL_COMMUNITY): Payer: Self-pay | Admitting: Pulmonary Disease

## 2015-10-07 DIAGNOSIS — F17200 Nicotine dependence, unspecified, uncomplicated: Secondary | ICD-10-CM

## 2015-10-07 DIAGNOSIS — R918 Other nonspecific abnormal finding of lung field: Secondary | ICD-10-CM | POA: Diagnosis not present

## 2015-10-07 LAB — GLUCOSE, CAPILLARY: GLUCOSE-CAPILLARY: 156 mg/dL — AB (ref 65–99)

## 2015-10-07 MED ORDER — DIAZEPAM 5 MG PO TABS: 5.0000 mg | ORAL_TABLET | Freq: Once | ORAL | Status: DC

## 2015-10-07 NOTE — Telephone Encounter (Signed)
Received call of [name] stating that patient is having trouble coughing up mucus and would like to have a order for a suction machine.  Informed [name] that patient can not put suction down his throat.

## 2015-10-08 ENCOUNTER — Institutional Professional Consult (permissible substitution) (INDEPENDENT_AMBULATORY_CARE_PROVIDER_SITE_OTHER): Payer: Medicare Other | Admitting: Thoracic Surgery (Cardiothoracic Vascular Surgery)

## 2015-10-08 ENCOUNTER — Other Ambulatory Visit: Payer: Self-pay | Admitting: *Deleted

## 2015-10-08 ENCOUNTER — Encounter: Payer: Self-pay | Admitting: Thoracic Surgery (Cardiothoracic Vascular Surgery)

## 2015-10-08 ENCOUNTER — Ambulatory Visit: Payer: Medicare Other | Admitting: Family Medicine

## 2015-10-08 ENCOUNTER — Encounter: Payer: Medicare Other | Attending: Oncology

## 2015-10-08 VITALS — BP 117/61 | HR 81 | Resp 20 | Ht 64.0 in | Wt 153.0 lb

## 2015-10-08 DIAGNOSIS — R918 Other nonspecific abnormal finding of lung field: Secondary | ICD-10-CM | POA: Diagnosis not present

## 2015-10-08 DIAGNOSIS — R590 Localized enlarged lymph nodes: Secondary | ICD-10-CM

## 2015-10-08 NOTE — Progress Notes (Signed)
Rescheduled

## 2015-10-08 NOTE — Progress Notes (Signed)
PCP is Chevis Pretty, FNP Referring Provider is Baird Cancer, PA-C  Chief Complaint  Patient presents with  . Lung Mass    Surgical eval, CT Chest/ABD 09/26/2015    HPI: [name] is sent for consultation regarding a right hilar mass and mediastinal adenopathy.  [name] is a 65 year old man with an extensive past medical history, including tobacco abuse (100 pack years), hypertension, hyperlipidemia, type 2 diabetes complicated by neuropathy, vasculopathy and nephropathy, peripheral vascular disease, history of right BKA, left carotid endarterectomy, chronic anemia, coronary artery disease, stage V chronic kidney disease, congestive heart failure.  He has been having difficulty swallowing since about the first of the year. He complains of shortness of breath, wheezing, and general malaise. He has lost 24 pounds in the past 3 months. He also has new swelling in his left arm. He denies any chest pain, pressure, or tightness. He had a CT of the abdomen as part of the workup for his swallowing issues and abdominal pain. There was a mass noted in the chest. A CT chest showed a large right hilar mass and massive mediastinal adenopathy.    Zubrod Score: At the time of surgery this patient's most appropriate activity status/level should be described as: '[]'$     0    Normal activity, no symptoms '[]'$     1    Restricted in physical strenuous activity but ambulatory, able to do out light work '[]'$     2    Ambulatory and capable of self care, unable to do work activities, up and about >50 % of waking hours                              '[]'$     3    Only limited self care, in bed greater than 50% of waking hours '[x]'$     4    Completely disabled, no self care, confined to bed or chair '[]'$     5    Moribund   Past Medical History  Diagnosis Date  . Diverticulosis 03/04/10  . Peripheral vascular disease, unspecified ([place])   . Undiagnosed cardiac murmurs   . Other symptoms involving  cardiovascular system   . HLD (hyperlipidemia)   . HTN (hypertension)   . Type II or unspecified type diabetes mellitus without mention of complication, not stated as uncontrolled   . Ulcer   . Carotid artery occlusion   . Umbilical hernia now    has not been repaired  . Blood transfusion   . AVM (arteriovenous malformation) of colon with hemorrhage   . Gastritis   . Internal hemorrhoids   . Right carotid bruit   . Shortness of breath     noted /w low Hgb  . GERD (gastroesophageal reflux disease)   . Chronic kidney disease     renal failure /w osteomyelitis- 2010  . Arthritis     back  . Anemia     8/3,4,5- blood transfusion- [facility], colonoscopy- done & found 2 areas of bleeding   . Tobacco use disorder   . Anxiety   . CHF (congestive heart failure) ([place])     3 yrs ago  . Irregular heart beat   . Iron deficiency anemia due to chronic blood loss 10/07/2010  . Chronic renal disease, stage 5, glomerular filtration rate less than or equal to 15 mL/min/1.73 square meter ([place]) 10/07/2010    Past Surgical History  Procedure Laterality Date  .  Colonoscopy  03/04/2010    Dr. Princella Pellegrini AMV without mention of ablation, scattered diverticula, internal hemorrhoids  . Bilateral common superficial femoral and profudus artery endarterectomies      2003    Dr. Sherren Mocha Early  . Esophagogastroduodenoscopy  03/2000    Dr. Tharon Aquas gastritis, H.Pylori gastritis, ?treated  . Esophagogastroduodenoscopy  05/2006    Dr. Marcello Fennel recurrent GI bleed. antral gastritis, single gastric AVM s/p APC, CLO results?  . Colonoscopy  11/2004    Dr. Barkley Bruns  . 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 Both of these are ablated with argon plasma coagulator./ Small external hemorrhoids  . Esophagogastroduodenoscopy  01/21/2012    SLF: MILD TO  MAODERATE Gastritis/ SLOW GIB LIKEY DUE TO PT BEING ON ASA ND SUBSEQUENT BLOOD/ LOSS FROM AVMS IN STOMACH AND COLON AS WELL AS GASTRITIS  . Enteroscopy  01/21/2012    Procedure: ENTEROSCOPY;  Surgeon: Danie Binder, MD;  [facility];  Service: Endoscopy;;  . No past surgeries    . Vascular surgery      rt bka and rt 1st toe amputation  . Below knee leg amputation      2010, wears prosthesis   . Endarterectomy  02/09/2012    Procedure: ENDARTERECTOMY CAROTID;  Surgeon: Rosetta Posner, MD;  Location: [facility];  Service: Vascular;  Laterality: Left;  . Carotid endarterectomy Left 02-09-12  . Esophagogastroduodenoscopy N/A 09/03/2013    Mild chronic gastritis. benign gastric polyps  . Givens capsule study N/A 09/03/2013    Dr. Oneida Alar: small bowel and colonic AVMs. no active bleeding  . Esophagogastroduodenoscopy N/A 08/19/2015    Dr. Oneida Alar: 2 large gastric AVMs in fundus, 1 actively bleeding s/p APC and clip placement, empiric Savary dilation  . Savory dilation N/A 08/19/2015    Procedure: SAVORY DILATION;  Surgeon: Danie Binder, MD;  [facility];  Service: Endoscopy;  Laterality: N/A;  . Flexible bronchoscopy N/A 09/29/2015    Procedure: FLEXIBLE BRONCHOSCOPY;  Surgeon: Sinda Du, MD;  [facility];  Service: Cardiopulmonary;  Laterality: N/A;  . Bronchial brushings  09/29/2015    Procedure: BRONCHIAL BRUSHINGS;  Surgeon: Sinda Du, MD;  [facility];  Service: Cardiopulmonary;;  Tracheal and left lung  . Bronchial washings  09/29/2015    Procedure: BRONCHIAL WASHINGS;  Surgeon: Sinda Du, MD;  [facility];  Service: Cardiopulmonary;;  Bilatersl washings    Family History  Problem Relation Age of Onset  . Colon cancer Brother 88    deceased  . Hyperlipidemia Brother   . Hypertension Brother   . Lung cancer Sister     deceased, three primary cancers: lung, esophageal, lymphoma.   . Diabetes Sister   . Hypertension Sister    . Cancer Father     gallbladder  . Hyperlipidemia Father   . Hypertension Father   . Cancer Brother     unknown primary  . Cancer Sister     unknown primary  . Hyperlipidemia Sister   . Diabetes Mother   . Hypertension Mother     Social History Social History  Substance Use Topics  . Smoking status: Current Every Day Smoker -- 2.00 packs/day for 50 years    Types: Cigarettes  . Smokeless tobacco: Former Systems developer    Quit date: 02/08/2012  Comment: Smokes about 2 packs of tiny skinny cigarettes  . Alcohol Use: No    Current Outpatient Prescriptions  Medication Sig Dispense Refill  . acetaminophen (TYLENOL) 500 MG tablet Take 1,000 mg by mouth daily as needed for mild pain or headache.    Marland Kitchen amLODipine (NORVASC) 10 MG tablet TAKE 1 TABLET BY MOUTH DAILY, BEFORE BREAKFAST 90 tablet 1  . atenolol (TENORMIN) 100 MG tablet TAKE 1 TABLET (100 MG TOTAL) BY MOUTH DAILY. (Patient taking differently: TAKE 1/2 TABLET (50 MG TOTAL) BY MOUTH TWICE  DAILY.) 90 tablet 1  . Calcium Carbonate Antacid (TUMS ULTRA 1000 PO) Take 2 tablets by mouth daily as needed (indigestion).     . cloNIDine (CATAPRES) 0.2 MG tablet Take 1 tablet (0.2 mg total) by mouth 2 (two) times daily. 60 tablet 3  . diazepam (VALIUM) 5 MG tablet Take 1 tablet (5 mg total) by mouth once. Take one tablet 1 hours prior to exam then take another one 1 hour later 2 tablet 0  . gabapentin (NEURONTIN) 300 MG capsule Take 1 capsule (300 mg total) by mouth 3 (three) times daily. (Patient taking differently: Take 300 mg by mouth daily as needed (nerve pain). ) 270 capsule 3  . glipiZIDE (GLUCOTROL) 10 MG tablet Take 0.5 tablets (5 mg total) by mouth daily before breakfast. Takes one tablet in the Am and 1/2 tablet in the pm (Patient taking differently: Take 5 mg by mouth daily as needed (high blood sugar). ) 135 tablet 0  . HYDROcodone-acetaminophen (NORCO/VICODIN) 5-325 MG tablet Take 1 tablet by mouth every 8 (eight) hours as needed. 30  tablet 0  . levofloxacin (LEVAQUIN) 500 MG tablet Take 1 tablet (500 mg total) by mouth every other day. 2 tablet 0  . ondansetron (ZOFRAN) 4 MG tablet Take 1 tablet (4 mg total) by mouth 4 (four) times daily -  before meals and at bedtime. 120 tablet 1  . pantoprazole (PROTONIX) 40 MG tablet TAKE 1 TABLET BY MOUTH TWICE A DAY BEFORE MEAL 180 tablet 1  . polyethylene glycol (MIRALAX / GLYCOLAX) packet Take 17 g by mouth daily as needed for mild constipation (*tAKES TWICE DAILY SOMETIMES).     . silver sulfADIAZINE (SILVADENE) 1 % cream APPLY TO AFFECTED AREA TWICE A DAY (Patient taking differently: Apply 1 application topically 2 (two) times daily as needed (sore skin). ) 240 g 1  . valsartan (DIOVAN) 160 MG tablet Take 1 tablet (160 mg total) by mouth daily. 30 tablet 5  . Vitamin D, Ergocalciferol, (DRISDOL) 50000 units CAPS capsule Take 50,000 Units by mouth once a week. Takes on Saturdays.  0   No current facility-administered medications for this visit.    Allergies  Allergen Reactions  . Zocor [Simvastatin] Palpitations    Chest soreness  . Asa [Aspirin] Other (See Comments)    GI Bleed  . Doxycycline Diarrhea  . Hctz [Hydrochlorothiazide]     Leg cramps  . Iodine Hives    IVP dye  . Sulfa Antibiotics Other (See Comments)    Unknown     Review of Systems  Constitutional: Positive for activity change, appetite change, fatigue and unexpected weight change (Lost 24 pounds in 3 months). Negative for fever and chills.  HENT: Positive for trouble swallowing and voice change.   Eyes: Negative for visual disturbance.  Respiratory: Positive for cough, shortness of breath, wheezing and stridor.   Cardiovascular: Negative for chest pain.       Orthopnea  Gastrointestinal: Negative  for abdominal pain and blood in stool.  Genitourinary: Negative for hematuria and difficulty urinating.  Musculoskeletal: Positive for arthralgias.       Left arm edema  Neurological: Positive for weakness  (Generalized). Negative for syncope.  Hematological: Negative for adenopathy. Does not bruise/bleed easily.    BP 117/61 mmHg  Pulse 81  Resp 20  Ht '5\' 4"'$  (1.626 m)  Wt 153 lb (69.4 kg)  BMI 26.25 kg/m2  SpO2 93% Physical Exam  Constitutional: He is oriented to person, place, and time. No distress.  65 year old man appears much older than stated age  HENT:  Head: Normocephalic and atraumatic.  Eyes: Conjunctivae and EOM are normal. No scleral icterus.  Neck: No thyromegaly present.  Well-healed scar left neck  Cardiovascular: Normal rate and regular rhythm.   Murmur (2/6 systolic) heard. Pulmonary/Chest:  Diminished breath sounds on right with rhonchi and wheezing  Abdominal: Soft. There is no tenderness.  Musculoskeletal: He exhibits edema (Left upper extremity mid forearm and hand).  Status post right BKA, prostheses in place  Lymphadenopathy:    He has no cervical adenopathy.  Neurological: He is alert and oriented to person, place, and time. No cranial nerve deficit.  Skin: Skin is warm and dry.  Vitals reviewed.    Diagnostic Tests: CT CHEST WITHOUT CONTRAST  TECHNIQUE: Multidetector CT imaging of the chest was performed following the standard protocol without IV contrast.  COMPARISON: Included lung bases from CT abdomen earlier today.  FINDINGS: Mediastinum/Lymph Nodes: Extensive mediastinal adenopathy and masslike opacity in the right hilar and right mediastinum, with marked paratracheal thickening and circumferential soft tissue density posterior lateral to the mid distal trachea, encasing the trachea at the carina, and causing luminal narrowing of the right mainstem bronchus. Exact measurements are difficult due to the large nature, however measures at least 9.5 cm in greatest dimension. Individual lymph nodes versus pulmonary mass cannot be discretely identified given lack contrast in the conglomerate soft tissue nature. Separate enlarged AP window  lymph node measures 1.5 cm short axis with additional superior mediastinal, prevascular, and AP window nodes. There is abnormal left axillary adenopathy. Multiple small right axillary lymph nodes also seen. Mild cardiomegaly. Moderate coronary artery calcifications. Atherosclerosis of the thoracic aorta which is normal in caliber. Physiologic pericardial fluid.  Lungs/Pleura: Abnormal right hilar soft tissue density with spiculation, a discrete right lung nodule or mass is not seen due to the conglomerate nature and lack of intravenous contrast. There luminal narrowing and near complete occlusion of the right mainstem bronchus secondary to bronchial encasement. Mass encases the bronchus intermedius with marked narrowing of the middle and lower lobe bronchi. Volume loss in the right lung with small right pleural effusion. Ill-defined and nodular right lower lobe opaciteis. 2 spiculated nodules in the left upper lobe. More superior nodule measures 2.4 x 1.8 x 2.2 cm with peripheral cavitation. The more inferior nodule measures 2.1 x 1.6 x 2.0 cm. Both nodules have spiculated borders.  Upper abdomen: Evaluated earlier this day at with CT abdomen/pelvis.  Musculoskeletal: No destructive lytic or discrete blastic osseous lesion. 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  IMPRESSION: 1. 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 Large right hilar/mediastinal masslike soft tissue density causing encasement and narrowing of the right mainstem bronchus and bronchus intermedius. Discrete mass difficult separate from the extensive multifocal mediastinal adenopathy. The right lower lobe opacities may be postobstructive atelectasis, spread of malignancy or pneumonia. Suspect bronchogenic malignancy, however primary lesion difficult to discern. 2.  Two spiculated left upper lobe nodules, both approximately 2 cm, primary versus metastatic disease. These  results were called by telephone at the time of interpretation on 09/26/2015 at 10:19 pm to Dr. Milton Ferguson , who verbally acknowledged these results.   Electronically Signed  By: Jeb Levering M.D.  On: 09/26/2015 22:20  I personally reviewed the CT chest confirmed the findings as noted above.  Impression: 65 year old man with a history of heavy tobacco abuse who has a large right hilar mass and massive mediastinal adenopathy. This is most likely small cell lung cancer. Non-small cell is also in the differential, but I think it's less likely. He was supposed to have a PET/CT earlier today but his wife said that he could not tolerate lying flat.  He had bronchoscopy but unfortunately was nondiagnostic. He needs a definitive diagnosis of the treatment can be initiated. I recommended that we do a repeat bronchoscopy, endobronchial ultrasound, and possible mediastinoscopy for diagnostic purposes.  I informed him of the general nature of the procedure and the intraoperative decision making as to whether to proceed with mediastinal endoscopy. We will plan to do the procedures and outpatient but we'll have a low threshold to keep her overnight observation as he appears very ill.  I informed him and his wife of the indications, risks, benefits, and alternatives. They understand this is diagnostic and not therapeutic. They understand that he is high risk due to his general health issues. They understand the risks include, but not limited to death, MI, stroke, DVT, PE, bleeding, possible need for transfusion, infection, pneumothorax, and failure to make a diagnosis.  He accepts the risks and agrees to proceed  Plan: Bronchoscopy, endobronchial ultrasound, possible mediastinoscopy on Monday, 10/13/2015  Melrose Nakayama, MD [facility] [phone]

## 2015-10-09 ENCOUNTER — Telehealth: Payer: Self-pay | Admitting: Nurse Practitioner

## 2015-10-09 NOTE — Telephone Encounter (Signed)
i do not know how to order this- i tried under DME and i cannot find it.

## 2015-10-10 ENCOUNTER — Encounter (HOSPITAL_COMMUNITY): Payer: Self-pay

## 2015-10-10 ENCOUNTER — Encounter (HOSPITAL_COMMUNITY)
Admission: RE | Admit: 2015-10-10 | Discharge: 2015-10-10 | Disposition: A | Discharge status: Home / Self Care | Payer: Medicare Other | Source: Ambulatory Visit | Attending: Thoracic Surgery (Cardiothoracic Vascular Surgery) | Admitting: Thoracic Surgery (Cardiothoracic Vascular Surgery)

## 2015-10-10 ENCOUNTER — Encounter (HOSPITAL_COMMUNITY): Payer: Self-pay | Admitting: Emergency Medicine

## 2015-10-10 ENCOUNTER — Ambulatory Visit (HOSPITAL_COMMUNITY)
Admission: RE | Admit: 2015-10-10 | Discharge: 2015-10-10 | Disposition: A | Discharge status: Home / Self Care | Payer: Medicare Other | Source: Ambulatory Visit | Attending: Thoracic Surgery (Cardiothoracic Vascular Surgery) | Admitting: Thoracic Surgery (Cardiothoracic Vascular Surgery)

## 2015-10-10 ENCOUNTER — Ambulatory Visit (HOSPITAL_COMMUNITY): Payer: Medicare Other | Admitting: Oncology

## 2015-10-10 ENCOUNTER — Ambulatory Visit: Payer: Medicare Other | Admitting: Family

## 2015-10-10 ENCOUNTER — Other Ambulatory Visit: Payer: Self-pay

## 2015-10-10 VITALS — BP 124/59 | HR 81 | Temp 97.7°F | Resp 18 | Ht 64.0 in | Wt 146.8 lb

## 2015-10-10 DIAGNOSIS — Z01810 Encounter for preprocedural cardiovascular examination: Secondary | ICD-10-CM

## 2015-10-10 DIAGNOSIS — I120 Hypertensive chronic kidney disease with stage 5 chronic kidney disease or end stage renal disease: Secondary | ICD-10-CM | POA: Insufficient documentation

## 2015-10-10 DIAGNOSIS — R0602 Shortness of breath: Secondary | ICD-10-CM | POA: Diagnosis not present

## 2015-10-10 DIAGNOSIS — M1990 Unspecified osteoarthritis, unspecified site: Secondary | ICD-10-CM | POA: Diagnosis not present

## 2015-10-10 DIAGNOSIS — R599 Enlarged lymph nodes, unspecified: Secondary | ICD-10-CM | POA: Insufficient documentation

## 2015-10-10 DIAGNOSIS — N185 Chronic kidney disease, stage 5: Secondary | ICD-10-CM | POA: Diagnosis not present

## 2015-10-10 DIAGNOSIS — F17210 Nicotine dependence, cigarettes, uncomplicated: Secondary | ICD-10-CM | POA: Diagnosis not present

## 2015-10-10 DIAGNOSIS — Z862 Personal history of diseases of the blood and blood-forming organs and certain disorders involving the immune mechanism: Secondary | ICD-10-CM | POA: Diagnosis not present

## 2015-10-10 DIAGNOSIS — R222 Localized swelling, mass and lump, trunk: Secondary | ICD-10-CM

## 2015-10-10 DIAGNOSIS — R918 Other nonspecific abnormal finding of lung field: Secondary | ICD-10-CM

## 2015-10-10 DIAGNOSIS — Z79899 Other long term (current) drug therapy: Secondary | ICD-10-CM | POA: Diagnosis not present

## 2015-10-10 DIAGNOSIS — K219 Gastro-esophageal reflux disease without esophagitis: Secondary | ICD-10-CM | POA: Insufficient documentation

## 2015-10-10 DIAGNOSIS — Z7984 Long term (current) use of oral hypoglycemic drugs: Secondary | ICD-10-CM | POA: Diagnosis not present

## 2015-10-10 DIAGNOSIS — Z01818 Encounter for other preprocedural examination: Secondary | ICD-10-CM

## 2015-10-10 DIAGNOSIS — R011 Cardiac murmur, unspecified: Secondary | ICD-10-CM | POA: Insufficient documentation

## 2015-10-10 DIAGNOSIS — J441 Chronic obstructive pulmonary disease with (acute) exacerbation: Secondary | ICD-10-CM | POA: Diagnosis not present

## 2015-10-10 DIAGNOSIS — Z8701 Personal history of pneumonia (recurrent): Secondary | ICD-10-CM | POA: Diagnosis not present

## 2015-10-10 DIAGNOSIS — E119 Type 2 diabetes mellitus without complications: Secondary | ICD-10-CM | POA: Insufficient documentation

## 2015-10-10 DIAGNOSIS — R05 Cough: Secondary | ICD-10-CM | POA: Diagnosis not present

## 2015-10-10 DIAGNOSIS — R590 Localized enlarged lymph nodes: Secondary | ICD-10-CM | POA: Insufficient documentation

## 2015-10-10 DIAGNOSIS — Z01812 Encounter for preprocedural laboratory examination: Secondary | ICD-10-CM | POA: Insufficient documentation

## 2015-10-10 DIAGNOSIS — I509 Heart failure, unspecified: Secondary | ICD-10-CM | POA: Insufficient documentation

## 2015-10-10 DIAGNOSIS — F419 Anxiety disorder, unspecified: Secondary | ICD-10-CM | POA: Insufficient documentation

## 2015-10-10 HISTORY — DX: Pneumonia, unspecified organism: J18.9

## 2015-10-10 HISTORY — DX: Dysphagia, unspecified: R13.10

## 2015-10-10 LAB — COMPREHENSIVE METABOLIC PANEL
ALT: 16 U/L — AB (ref 17–63)
ANION GAP: 13 (ref 5–15)
AST: 23 U/L (ref 15–41)
Albumin: 3.1 g/dL — ABNORMAL LOW (ref 3.5–5.0)
Alkaline Phosphatase: 96 U/L (ref 38–126)
BUN: 91 mg/dL — ABNORMAL HIGH (ref 6–20)
CHLORIDE: 96 mmol/L — AB (ref 101–111)
CO2: 26 mmol/L (ref 22–32)
Calcium: 9.7 mg/dL (ref 8.9–10.3)
Creatinine, Ser: 4.82 mg/dL — ABNORMAL HIGH (ref 0.61–1.24)
GFR, EST AFRICAN AMERICAN: 13 mL/min — AB (ref >60)
GFR, EST NON AFRICAN AMERICAN: 11 mL/min — AB (ref >60)
Glucose, Bld: 185 mg/dL — ABNORMAL HIGH (ref 65–99)
POTASSIUM: 5.4 mmol/L — AB (ref 3.5–5.1)
SODIUM: 135 mmol/L (ref 135–145)
Total Bilirubin: 0.7 mg/dL (ref 0.3–1.2)
Total Protein: 6.4 g/dL — ABNORMAL LOW (ref 6.5–8.1)

## 2015-10-10 LAB — CBC
HCT: 33.6 % — ABNORMAL LOW (ref 39.0–52.0)
Hemoglobin: 10.6 g/dL — ABNORMAL LOW (ref 13.0–17.0)
MCH: 28.7 pg (ref 26.0–34.0)
MCHC: 31.5 g/dL (ref 30.0–36.0)
MCV: 91.1 fL (ref 78.0–100.0)
PLATELETS: 252 10*3/uL (ref 150–400)
RBC: 3.69 MIL/uL — AB (ref 4.22–5.81)
RDW: 15.3 % (ref 11.5–15.5)
WBC: 9.1 10*3/uL (ref 4.0–10.5)

## 2015-10-10 LAB — SURGICAL PCR SCREEN
MRSA, PCR: NEGATIVE
STAPHYLOCOCCUS AUREUS: NEGATIVE

## 2015-10-10 LAB — GLUCOSE, CAPILLARY: GLUCOSE-CAPILLARY: 165 mg/dL — AB (ref 65–99)

## 2015-10-10 LAB — PROTIME-INR
INR: 1.16 (ref 0.00–1.49)
PROTHROMBIN TIME: 15 s (ref 11.6–15.2)

## 2015-10-10 LAB — TYPE AND SCREEN
ABO/RH(D): O POS
ANTIBODY SCREEN: NEGATIVE

## 2015-10-10 LAB — APTT: aPTT: 32 seconds (ref 24–37)

## 2015-10-10 NOTE — ED Notes (Addendum)
Pt. reports SOB with productive cough this week worse yesterday , denies fever or chills, blood tests and chest x-ray done today ( Preop labs/x-ray) for his right lung biopsy/bronchoscopy on Monday to rule out lung cancer.

## 2015-10-10 NOTE — Pre-Procedure Instructions (Signed)
[name]  10/10/2015     Your procedure is scheduled on : Monday October 13, 2015 at 12:56 PM.  Report to [facility] Admitting at 10:50 AM.  Call this number if you have problems the morning of surgery: [phone]    Remember:  Do not eat food or drink liquids after midnight.  Take these medicines the morning of surgery with A SIP OF WATER : Acetaminophen (Tylenol) if needed, Amlodipine (Norvasc), Atenolol (Tenormin), Clonidine (Catapres), Gabapentin (Neurontin), Hydrocodone if needed, Pantoprazole (Protonix)   Stop taking any vitamins, herbal medications, NSAIDs, Ibuprofen, Advil, Motrin, Aleve, etc today   Do NOT take any diabetic pills the morning of your surgery (NO Glipizide/Glucotrol)     How to Manage Your Diabetes Before and After Surgery  Why is it important to control my blood sugar before and after surgery? . Improving blood sugar levels before and after surgery helps healing and can limit problems. . A way of improving blood sugar control is eating a healthy diet by: o  Eating less sugar and carbohydrates o  Increasing activity/exercise o  Talking with your doctor about reaching your blood sugar goals . High blood sugars (greater than 180 mg/dL) can raise your risk of infections and slow your recovery, so you will need to focus on controlling your diabetes during the weeks before surgery. . Make sure that the doctor who takes care of your diabetes knows about your planned surgery including the date and location.  How do I manage my blood sugar before surgery? . Check your blood sugar at least 4 times a day, starting 2 days before surgery, to make sure that the level is not too high or low. o Check your blood sugar the morning of your surgery when you wake up and every 2 hours until you get to the Short Stay unit. . If your blood sugar is less than 70 mg/dL, you will need to treat for low blood sugar: o Do not take insulin. o Treat a low blood sugar  (less than 70 mg/dL) with  cup of clear juice (cranberry or apple), 4 glucose tablets, OR glucose gel. o Recheck blood sugar in 15 minutes after treatment (to make sure it is greater than 70 mg/dL). If your blood sugar is not greater than 70 mg/dL on recheck, call [phone] for further instructions. . Report your blood sugar to the short stay nurse when you get to Short Stay.  . If you are admitted to the hospital after surgery: o Your blood sugar will be checked by the staff and you will probably be given insulin after surgery (instead of oral diabetes medicines) to make sure you have good blood sugar levels. o The goal for blood sugar control after surgery is 80-180 mg/dL.     WHAT DO I DO ABOUT MY DIABETES MEDICATION?   Marland Kitchen Do not take oral diabetes medicines (pills) the morning of surgery.  Reviewed and Endorsed by [facility] Patient Education Committee, August 2015   Do not wear jewelry.  Do not wear lotions, powders, or cologne.  You may NOT wear deodorant.  Men may shave face and neck.  Do not bring valuables to the hospital.  [facility] is not responsible for any belongings or valuables.  Contacts, dentures or bridgework may not be worn into surgery.  Leave your suitcase in the car.  After surgery it may be brought to your room.  For patients admitted to the hospital, discharge time will be determined  by your treatment team.  Patients discharged the day of surgery will not be allowed to drive home.   Name and phone number of your driver:    Special instructions:  Shower using CHG soap the night before and the morning of your surgery  Please read over the following fact sheets that you were given. Pain Booklet, Coughing and Deep Breathing, Blood Transfusion Information, MRSA Information and Surgical Site Infection Prevention

## 2015-10-10 NOTE — Progress Notes (Signed)
Anesthesia PAT Evaluation: Patient is a 65 year old male scheduled for video bronchoscopy with endobronchial ultrasound, possible mediastinoscopy on 10/13/15 by Dr. Roxan Hockey. DX: right hilar mass; mediastinal adenopathy.    He presented to [facility] on 09/26/15 with complaints of epigastric pain and dysphagia. He had previously undergone EGD in 07/2015 showed non-erosive gastritis and duodenal AVMs which were treated with IV iron and PRN blood transfusion. 09/26/15 CT of the abd/pelvis showed possible RLL pneumonia and changes consistent with right hilum lymphadenopathy. Chest CT confirmed a large right hilar/medistinal mass like soft tissue density causing encasement and narrowing of the right mainstem bronchus and bronchus intermedius. There were also two spiculated LUL nodules. 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 He underwent flexible bronchoscopy on 09/29/15 at [facility] that was non-diagnostic (benign reactive/repairative changes). Patient was not able to tolerate lying down for the PET scan. Dr. Roxan Hockey has recommended the above procedure in hopes to obtain an definitive diagnosis so treatment can be initiated.    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 Sharol Given).   - PCP is with [facility]--usually sees Chevis Pretty, [place].  - Nephrologist is Dr. Hinda Lenis. He reports nearing need for hemodialysis (was scheduled for catheter insertion on 10/14/15, but now being delayed until this procedure is done).  - Cardiologist is Dr. Acie Fredrickson, who first saw patient on 08/27/15 for HTN follow-up . Echo was done then that showed EF 45% with diffuse hypokinesis. Dr. Acie Fredrickson felt patient likely had underlying CAD, but was asymptomatic so was not planning to order a a  stress myoview unless having a major surgical procedure.  - Pulmonologist is Dr. Sinda Du (seen during 09/2015 [facility] admission). - GI is Dr. Barney Drain. - Vascular surgeon is Dr. Donnetta Hutching. Last seen at [facility] in 02/2013.  PAT Vitals: BP 124/59, HR 81, O2 sat 96% on 2L, RR 18, T 36.5C. CBG 165. Patient presents with his wife at side. He is in a hospital wheelchair. He is sitting with his shoulders slumped and head looking down most of the time but will pick his head up to talk. He says this is primarily just because he is tired. He has not been able to sleep well--even sitting up right. He has had another 6 lb weight loss in the past week. He is only able to drink liquids such as Ensure and Special K Shakes. He had a bad spell last night of coughing and though he may have to call 911 because of difficulty breathing until he passed the phlegm. He is sitting in the wheelchair now without signs of respiratory distress. He did have an intermittent productive cough and has been able to clear those secretions. His voice is mildly hoarse. No stridor noted. His BUE are swollen, L > R, which he says has been present for the past several weeks. He was started on home O2 at 2L/[place] 24/7 on 10/08/15. Heart RRR, no murmur noted. Lungs sounds have scattered rhonchi and wheezing bilaterally. No conversational dyspnea or labored breathing noted, although he did not talk in prolonged sentences. Patient just seen by Dr. Roxan Hockey two days ago. Patient says he feels more tired since then and feels he is more hoarse.    10/10/15 EKG: NSR. Negative or flat T wave in III and aVF also present on 08/27/15  tracing from [facility].  08/29/15 Echo: Study Conclusions - Left ventricle: The cavity size was normal. Wall thickness was  increased in a pattern of mild LVH. The estimated ejection  fraction was 45%. Diffuse hypokinesis. Doppler parameters are  consistent with abnormal left ventricular relaxation (grade 1  diastolic  dysfunction). - Aortic valve: There was no stenosis. - Mitral valve: Mildly calcified annulus. Mildly calcified leaflets  . There was mild regurgitation. - Left atrium: The atrium was moderately dilated. - Right ventricle: The cavity size was normal. Systolic function  was normal. - Pulmonary arteries: No complete TR doppler jet so unable to  estimate PA systolic pressure. - Inferior vena cava: The vessel was normal in size. The  respirophasic diameter changes were in the normal range (>= 50%),  consistent with normal central venous pressure. - Pericardium, extracardiac: A trivial pericardial effusion was  identified posterior to the heart. Impressions: - Normal LV size with mild LV hypertrophy. EF 45% with diffuse  hypokinesis. Normal RV size and systolic function. Mild MR.  09/19/12 Carotid U/S: 60-79% RICA stenosis. RECA stenosis. Patent left CEA site with no evidence of restenosis.  10/10/15 CXR:  IMPRESSION: 1. Increased volume loss on the right likely due to postobstructive atelectasis as well as elevated right hemidiaphragm. 2. Persistent bulky right paratracheal lymphadenopathy. Two upper lobe left lung masses remain visible. 3. No evidence of CHF.  Preoperative labs noted. H/H 10.6/33.6. K 5.4. BUN 91, Cr 4.82. Glucose 185. (previous BUN 77, Cr 4.73, on 09/29/15).   Patient's labs abnormal today, but results are not significantly worse then 09/29/15. His vitals are also stable, and he does not currently appear to be in acute distress, but I was concerned that he is starting to feel worse just within the past two days. Patient even questioned if procedure should be done today. I found Dr. Roxan Hockey and spoke with him in person to relay my concerns and recent patient events. Labs were not back at that time, but I did notify him that patient has been told that he needs a catheter next week to begin hemodialysis. Dr. Roxan Hockey felt that if patient was having acute  SOB/respiratory distress or felt unsafe to go home that he should go to the ED to be evaluated. If patient denied both of these then he could go home with instructions to call 911 if he developed recurrent acute SOB or other worsening symptoms. Dr. Roxan Hockey did not feel he needed to further evaluate patient while at PAT. I also discussed patient's history, summary of my discussions with patient and Dr. Roxan Hockey, and lab results with anesthesiologist Dr. Gifford Shave who was in agreement with surgeon's plan. I discussed options as outlined by Dr. Roxan Hockey with patient and his wife. I told them that I was concerned that he could develop another acute respiratory episode over the weekend and would need urgent or emergent medical evaluation, and felt that if he was seen in the ED this afternoon he would likely be admitted because of his overall poor general health (ie, hilar mass with productive cough with intermittent difficulty clearing secretions, weight loss/failure to thrive, CKD V with mild hyperkalemia), particularly since he is scheduled for surgery on Monday. Despite this, patient declined evaluation in the ED. He and his wife were in agreement that they would call 911 if he developed any new or worsening symptoms.  He will need an ISTAT8 on arrival to re-evaluate his K and Cr. I think he may require admission post-operatively, if he is not  admitted over the weekend.  George [facility] Short Stay Center/Anesthesiology Phone [phone] 10/10/2015 6:05 PM

## 2015-10-10 NOTE — Progress Notes (Signed)
Patient arrived to PAT via wheelchair accompanied by his wife [name]. Patient was on 2 liters of O2 [place] and sats were 96%. Patient denied having any current shortness of breath, chest pain or discomfort. Patient did inform Nurse that he has a cough and is coughing up "white sputum" that "sometimes it's a little bit and sometimes it's a lot."  PCP is Chevis Pretty  Cardiologist is Cleatrice Burke  Nephrologist is [employer]. Patients wife informed Nurse that patient was supposed to have a dialysis catheter placed on 10/14/15, however that appointment needed to be canceled because his "lung issues were more important at this time".  CBG on arrival to PAT was 165, and patients wife informed Nurse that patient is unable to swallow any food, and he only drinks protein shakes. She also stated that patient has not been taking any diabetic pills because if he does not eat, then it drops his blood glucose to low. Last A1c was 5.9 in EPIC on 09/27/15.     Ebony Hail, [place] informed of this and will come see patient before leaving PAT.

## 2015-10-11 ENCOUNTER — Emergency Department (HOSPITAL_COMMUNITY): Payer: Medicare Other

## 2015-10-11 ENCOUNTER — Other Ambulatory Visit: Payer: Self-pay | Admitting: Family

## 2015-10-11 ENCOUNTER — Emergency Department (HOSPITAL_COMMUNITY)
Admission: EM | Admit: 2015-10-11 | Discharge: 2015-10-11 | Disposition: A | Discharge status: Home / Self Care | Payer: Medicare Other | Attending: Emergency Medicine | Admitting: Emergency Medicine

## 2015-10-11 ENCOUNTER — Encounter (HOSPITAL_COMMUNITY): Payer: Self-pay | Admitting: Emergency Medicine

## 2015-10-11 DIAGNOSIS — R0602 Shortness of breath: Secondary | ICD-10-CM | POA: Diagnosis not present

## 2015-10-11 DIAGNOSIS — J441 Chronic obstructive pulmonary disease with (acute) exacerbation: Secondary | ICD-10-CM | POA: Diagnosis not present

## 2015-10-11 DIAGNOSIS — R05 Cough: Secondary | ICD-10-CM | POA: Diagnosis not present

## 2015-10-11 LAB — I-STAT TROPONIN, ED: TROPONIN I, POC: 0.03 ng/mL (ref 0.00–0.08)

## 2015-10-11 LAB — I-STAT CHEM 8, ED
BUN: 92 mg/dL — ABNORMAL HIGH (ref 6–20)
CHLORIDE: 98 mmol/L — AB (ref 101–111)
Calcium, Ion: 1.24 mmol/L (ref 1.13–1.30)
Creatinine, Ser: 4.9 mg/dL — ABNORMAL HIGH (ref 0.61–1.24)
Glucose, Bld: 129 mg/dL — ABNORMAL HIGH (ref 65–99)
HEMATOCRIT: 31 % — AB (ref 39.0–52.0)
HEMOGLOBIN: 10.5 g/dL — AB (ref 13.0–17.0)
POTASSIUM: 4.6 mmol/L (ref 3.5–5.1)
SODIUM: 136 mmol/L (ref 135–145)
TCO2: 28 mmol/L (ref 0–100)

## 2015-10-11 LAB — BRAIN NATRIURETIC PEPTIDE: B NATRIURETIC PEPTIDE 5: 207.9 pg/mL — AB (ref 0.0–100.0)

## 2015-10-11 MED ORDER — AMOXICILLIN-POT CLAVULANATE 875-125 MG PO TABS: 1.0000 | ORAL_TABLET | Freq: Two times a day (BID) | ORAL | Status: DC

## 2015-10-11 MED ORDER — PREDNISONE 20 MG PO TABS: ORAL_TABLET | ORAL | Status: DC

## 2015-10-11 MED ORDER — LEVOFLOXACIN 750 MG PO TABS: 750.0000 mg | ORAL_TABLET | Freq: Every day | ORAL | Status: DC

## 2015-10-11 MED ORDER — LORATADINE 10 MG PO TABS
10.0000 mg | ORAL_TABLET | Freq: Once | ORAL | Status: AC
  Administered 2015-10-11: 10 mg via ORAL
  Filled 2015-10-11: qty 1

## 2015-10-11 MED ORDER — IPRATROPIUM BROMIDE 0.02 % IN SOLN
0.5000 mg | Freq: Once | RESPIRATORY_TRACT | Status: AC
Start: 2015-10-11 — End: 2015-10-11
  Administered 2015-10-11: 0.5 mg via RESPIRATORY_TRACT
  Filled 2015-10-11: qty 2.5

## 2015-10-11 MED ORDER — ALBUTEROL SULFATE (2.5 MG/3ML) 0.083% IN NEBU
5.0000 mg | INHALATION_SOLUTION | Freq: Once | RESPIRATORY_TRACT | Status: AC
  Administered 2015-10-11: 5 mg via RESPIRATORY_TRACT
  Filled 2015-10-11: qty 6

## 2015-10-11 MED ORDER — ALBUTEROL SULFATE (2.5 MG/3ML) 0.083% IN NEBU
INHALATION_SOLUTION | RESPIRATORY_TRACT | Status: AC
  Filled 2015-10-11: qty 6

## 2015-10-11 MED ORDER — GUAIFENESIN ER 600 MG PO TB12
1200.0000 mg | ORAL_TABLET | Freq: Two times a day (BID) | ORAL | Status: DC
  Administered 2015-10-11: 1200 mg via ORAL
  Filled 2015-10-11: qty 2

## 2015-10-11 MED ORDER — GUAIFENESIN 400 MG PO TABS
400.0000 mg | ORAL_TABLET | Freq: Four times a day (QID) | ORAL | Status: AC
Start: 2015-10-11 — End: ?

## 2015-10-11 MED ORDER — PREDNISONE 20 MG PO TABS
60.0000 mg | ORAL_TABLET | Freq: Once | ORAL | Status: AC
  Administered 2015-10-11: 60 mg via ORAL
  Filled 2015-10-11: qty 3

## 2015-10-11 NOTE — ED Notes (Signed)
Pt verbalized understanding of d/c instructions, prescriptions, and follow-up care. No further questions/concerns, VSS, assisted to lobby in wheelchair.  

## 2015-10-11 NOTE — ED Provider Notes (Signed)
CSN: [id number]     Arrival date & time 10/10/15  2140 History   By signing my name below, I, Forrestine Him, attest that this documentation has been prepared under the direction and in the presence of Dorice Stiggers, MD.  Electronically Signed: Forrestine Him, ED Scribe. 10/11/2015. 2:53 AM.   Chief Complaint  Patient presents with  . Shortness of Breath   Patient is a 65 y.o. male presenting with shortness of breath. The history is provided by a relative and the patient. No language interpreter was used.  Shortness of Breath Severity:  Moderate Onset quality:  Gradual Timing:  Constant Progression:  Worsening Chronicity:  Chronic Context: not weather changes   Relieved by:  Nothing Worsened by:  Nothing tried Ineffective treatments:  Oxygen Associated symptoms: cough and wheezing   Associated symptoms: no abdominal pain, no chest pain, no fever, no headaches and no vomiting   Risk factors: no prolonged immobilization   Risk factors comment:  Known COPD on O2 and lung mass   HPI Comments: JERMARIO KALMAR is a 65 y.o. male with a PMHx of PVD, HLD, HTN, DM, CHF, and CKD who presents to the Emergency Department complaining of constant, ongoing shortness of breath x months; worsened in the last few days. Pt also reports a productive cough. No aggravating or alleviating factors reported. No breathing treatments attempted prior to arrival. No recent fever, chills, chest pain, nausea, vomiting, or abdominal pain. Pt is typically on 2 liters of Oxygen at home. Pt is due to have a biopsy/bronchoscopy on Monday 4/24. Preop blood work and CXR performed today referred to the ED by the person doing the pre op testing for chronic respiratory issues.  PCP: Chevis Pretty, FNP    Past Medical History  Diagnosis Date  . Diverticulosis 03/04/10  . Peripheral vascular disease, unspecified ([place])   . Undiagnosed cardiac murmurs   . Other symptoms involving cardiovascular system   . HLD  (hyperlipidemia)   . HTN (hypertension)   . Type II or unspecified type diabetes mellitus without mention of complication, not stated as uncontrolled   . Ulcer   . Carotid artery occlusion   . Umbilical hernia now    has not been repaired  . Blood transfusion   . AVM (arteriovenous malformation) of colon with hemorrhage   . Gastritis   . Internal hemorrhoids   . Right carotid bruit   . Shortness of breath     noted /w low Hgb  . GERD (gastroesophageal reflux disease)   . Arthritis     back  . Anemia     8/3,4,5- blood transfusion- [facility], colonoscopy- done & found 2 areas of bleeding   . Tobacco use disorder   . Anxiety   . CHF (congestive heart failure) ([place])     3 yrs ago  . Irregular heart beat   . Iron deficiency anemia due to chronic blood loss 10/07/2010  . Pneumonia Dameon Soltis 2017  . Chronic kidney disease     renal failure /w osteomyelitis- 2010  . Chronic renal disease, stage 5, glomerular filtration rate less than or equal to 15 mL/min/1.73 square meter (HCC) 10/07/2010  . Difficulty swallowing solids    Past Surgical History  Procedure Laterality Date  . Colonoscopy  03/04/2010    Dr. Princella Pellegrini AMV without mention of ablation, scattered diverticula, internal hemorrhoids  . Bilateral common superficial femoral and profudus artery endarterectomies      2003    Dr. Sherren Mocha Early  . Esophagogastroduodenoscopy  03/2000    Dr. Tharon Aquas gastritis, H.Pylori gastritis, ?treated  . Esophagogastroduodenoscopy  05/2006    Dr. Marcello Fennel recurrent GI bleed. antral gastritis, single gastric AVM s/p APC, CLO results?  . Colonoscopy  11/2004    Dr. Barkley Bruns  . 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 Both of these are ablated with argon plasma coagulator./ Small external hemorrhoids  . Esophagogastroduodenoscopy   01/21/2012    SLF: MILD TO MAODERATE Gastritis/ SLOW GIB LIKEY DUE TO PT BEING ON ASA ND SUBSEQUENT BLOOD/ LOSS FROM AVMS IN STOMACH AND COLON AS WELL AS GASTRITIS  . Enteroscopy  01/21/2012    Procedure: ENTEROSCOPY;  Surgeon: Danie Binder, MD;  [facility];  Service: Endoscopy;;  . Below knee leg amputation      2010, wears prosthesis   . Endarterectomy  02/09/2012    Procedure: ENDARTERECTOMY CAROTID;  Surgeon: Rosetta Posner, MD;  Location: [place];  Service: Vascular;  Laterality: Left;  . Carotid endarterectomy Left 02-09-12  . Esophagogastroduodenoscopy N/A 09/03/2013    Mild chronic gastritis. benign gastric polyps  . Givens capsule study N/A 09/03/2013    Dr. Oneida Alar: small bowel and colonic AVMs. no active bleeding  . Esophagogastroduodenoscopy N/A 08/19/2015    Dr. Oneida Alar: 2 large gastric AVMs in fundus, 1 actively bleeding s/p APC and clip placement, empiric Savary dilation  . Savory dilation N/A 08/19/2015    Procedure: SAVORY DILATION;  Surgeon: Danie Binder, MD;  [facility];  Service: Endoscopy;  Laterality: N/A;  . Flexible bronchoscopy N/A 09/29/2015    Procedure: FLEXIBLE BRONCHOSCOPY;  Surgeon: Sinda Du, MD;  [facility];  Service: Cardiopulmonary;  Laterality: N/A;  . Bronchial brushings  09/29/2015    Procedure: BRONCHIAL BRUSHINGS;  Surgeon: Sinda Du, MD;  [facility];  Service: Cardiopulmonary;;  Tracheal and left lung  . Bronchial washings  09/29/2015    Procedure: BRONCHIAL WASHINGS;  Surgeon: Sinda Du, MD;  [facility];  Service: Cardiopulmonary;;  Bilatersl washings  . Vascular surgery      rt bka and rt 1st toe amputation   Family History  Problem Relation Age of Onset  . Colon cancer Brother 98    deceased  . Hyperlipidemia Brother   . Hypertension Brother   . Lung cancer Sister     deceased, three primary cancers: lung, esophageal, lymphoma.   . Diabetes Sister   . Hypertension Sister    . Cancer Father     gallbladder  . Hyperlipidemia Father   . Hypertension Father   . Cancer Brother     unknown primary  . Cancer Sister     unknown primary  . Hyperlipidemia Sister   . Diabetes Mother   . Hypertension Mother    Social History  Substance Use Topics  . Smoking status: Current Every Day Smoker -- 2.00 packs/day for 50 years    Types: Cigarettes  . Smokeless tobacco: Former Systems developer    Quit date: 02/08/2012     Comment: Smokes about 2 packs of tiny skinny cigarettes  . Alcohol Use: No    Review of Systems  Constitutional: Negative for fever and chills.  Respiratory: Positive for cough, shortness of breath and wheezing.   Cardiovascular: Negative for chest pain.  Gastrointestinal: Negative for nausea, vomiting, abdominal pain and diarrhea.  Musculoskeletal: Negative for arthralgias.  Neurological: Negative for headaches.  Psychiatric/Behavioral: Negative for confusion.  All other systems reviewed and are negative.     Allergies  Zocor; Asa; Doxycycline; Hctz; Iodine; and Sulfa antibiotics  Home Medications   Prior to Admission medications   Medication Sig Start Date End Date Taking? Authorizing Provider  acetaminophen (TYLENOL) 500 MG tablet Take 1,000 mg by mouth daily as needed for mild pain or headache.    Historical Provider, MD  amLODipine (NORVASC) 10 MG tablet TAKE 1 TABLET BY MOUTH DAILY, BEFORE BREAKFAST 05/06/15   Sharion Balloon, FNP  atenolol (TENORMIN) 100 MG tablet TAKE 1 TABLET (100 MG TOTAL) BY MOUTH DAILY. Patient taking differently: TAKE 1/2 TABLET (50 MG TOTAL) BY MOUTH TWICE  DAILY. 09/24/15   [name] Done, FNP  Calcium Carbonate Antacid (TUMS ULTRA 1000 PO) Take 2 tablets by mouth daily as needed (indigestion).     Historical Provider, MD  cloNIDine (CATAPRES) 0.2 MG tablet Take 1 tablet (0.2 mg total) by mouth 2 (two) times daily. 07/11/15   [name] Done, FNP  diazepam (VALIUM) 5 MG tablet Take 1 tablet (5 mg total) by  mouth once. Take one tablet 1 hours prior to exam then take another one 1 hour later 10/07/15   Patrici Ranks, MD  gabapentin (NEURONTIN) 300 MG capsule Take 1 capsule (300 mg total) by mouth 3 (three) times daily. Patient taking differently: Take 300 mg by mouth daily as needed (nerve pain).  03/22/14   Sharion Balloon, FNP  glipiZIDE (GLUCOTROL) 10 MG tablet Take 0.5 tablets (5 mg total) by mouth daily before breakfast. Takes one tablet in the Am and 1/2 tablet in the pm Patient taking differently: Take 5 mg by mouth daily as needed (high blood sugar).  09/02/15   Kathie Dike, MD  HYDROcodone-acetaminophen (NORCO/VICODIN) 5-325 MG tablet Take 1 tablet by mouth every 8 (eight) hours as needed. 09/30/15   Erline Hau, MD  levofloxacin (LEVAQUIN) 500 MG tablet Take 1 tablet (500 mg total) by mouth every other day. 09/30/15   Erline Hau, MD  ondansetron (ZOFRAN) 4 MG tablet Take 1 tablet (4 mg total) by mouth 4 (four) times daily -  before meals and at bedtime. 08/15/15   Orvil Feil, NP  pantoprazole (PROTONIX) 40 MG tablet TAKE 1 TABLET BY MOUTH TWICE A DAY BEFORE MEAL 06/19/15   Sharion Balloon, FNP  polyethylene glycol (MIRALAX / GLYCOLAX) packet Take 17 g by mouth daily as needed for mild constipation (*tAKES [place]).     Historical Provider, MD  silver sulfADIAZINE (SILVADENE) 1 % cream APPLY TO AFFECTED AREA TWICE A DAY Patient taking differently: Apply 1 application topically 2 (two) times daily as needed (sore skin).  03/14/14   [name] Done, FNP  valsartan (DIOVAN) 160 MG tablet Take 1 tablet (160 mg total) by mouth daily. 09/18/15   [name] Done, FNP  Vitamin D, Ergocalciferol, (DRISDOL) 50000 units CAPS capsule Take 50,000 Units by mouth once a week. Takes on Saturdays. 08/10/15   Historical Provider, MD   Triage Vitals: BP 127/48 mmHg  Pulse 76  Temp(Src) 98.6 F (37 C) (Oral)  Resp 22  Ht '5\' 4"'$  (1.626 m)  Wt 146 lb (66.225 kg)   BMI 25.05 kg/m2  SpO2 99%   Physical Exam  Constitutional: He is oriented to person, place, and time. He appears well-developed and well-nourished.  HENT:  Head: Normocephalic and atraumatic.  Mouth/Throat: Oropharynx is clear  and moist. No oropharyngeal exudate.  Eyes: EOM are normal. Pupils are equal, round, and reactive to light.  Neck: Normal range of motion. Neck supple.  No bruits  Trachea is midline   Cardiovascular: Normal rate, regular rhythm, normal heart sounds and intact distal pulses.   Pulmonary/Chest: Effort normal. No stridor. No respiratory distress. He has no wheezes. He has no rales.  Diminished bilaterally   Abdominal: Soft. He exhibits no distension. There is no tenderness. There is no rebound and no guarding.  Musculoskeletal: Normal range of motion. He exhibits no edema or tenderness.  All compartments are soft  Neurological: He is alert and oriented to person, place, and time.  Skin: Skin is warm and dry.  Psychiatric: He has a normal mood and affect. Judgment normal.  Nursing note and vitals reviewed.   ED Course  Procedures (including critical care time)  DIAGNOSTIC STUDIES: Oxygen Saturation is 99% on RA, Normal by my interpretation.    COORDINATION OF CARE: 2:46 AM- Will order blood work. Will give breathing treatment. Discussed treatment plan with pt at bedside and pt agreed to plan.     Labs Review Labs Reviewed  CBC WITH DIFFERENTIAL/PLATELET  BRAIN NATRIURETIC PEPTIDE  I-STAT CHEM 8, ED  I-STAT TROPOININ, ED    Imaging Review Dg Chest 2 View  10/10/2015  CLINICAL DATA:  Preoperative examination prior to left lung biopsy EXAM: CHEST  2 VIEW COMPARISON:  CT scan of the chest of Marilee Ditommaso 7, 2017 FINDINGS: There is volume loss on the right which is not entirely new but more conspicuous than on the previous study. This is in part due to elevation of the right hemidiaphragm. There is right basilar atelectasis or pneumonia. On the left the known  upper lobe mass is visible overlying the posterior aspect of the second rib. An additional subtle masses visible inferior to this. There is no definite pleural effusion though a small effusion on the right is not excluded. There is soft tissue fullness in the right paratracheal and right suprahilar region consistent with lymphadenopathy. The heart is not enlarged. The pulmonary vascularity is not engorged. There is mild multilevel degenerative disc disease of the thoracic spine. IMPRESSION: 1. Increased volume loss on the right likely due to postobstructive atelectasis as well as elevated right hemidiaphragm. 2. Persistent bulky right paratracheal lymphadenopathy. Two upper lobe left lung masses remain visible. 3. No evidence of CHF. Electronically Signed   By: David  Martinique M.D.   On: 10/10/2015 14:57   I have personally reviewed and evaluated these images and lab results as part of my medical decision-making.   EKG Interpretation   Date/Time:  Friday Adin Laker 21 2017 21:48:13 EDT Ventricular Rate:  78 PR Interval:  148 QRS Duration: 84 QT Interval:  384 QTC Calculation: 437 R Axis:   55 Text Interpretation:  Normal sinus rhythm with sinus arrhythmia Confirmed  by [facility]  MD, Yulitza Shorts ([id number]) on 10/11/2015 2:13:44 AM      MDM     Filed Vitals:   10/10/15 2151 10/11/15 0125  BP: 116/59 127/48  Pulse: 78 76  Temp: 98.5 F (36.9 C) 98.6 F (37 C)  Resp: 22 22    Results for orders placed or performed during the hospital encounter of 10/10/15  Surgical pcr screen  Result Value Ref Range   MRSA, PCR NEGATIVE NEGATIVE   Staphylococcus aureus NEGATIVE NEGATIVE  Glucose, capillary  Result Value Ref Range   Glucose-Capillary 165 (H) 65 - 99 mg/dL  APTT  Result  Value Ref Range   aPTT 32 24 - 37 seconds  CBC  Result Value Ref Range   WBC 9.1 4.0 - 10.5 K/uL   RBC 3.69 (L) 4.22 - 5.81 MIL/uL   Hemoglobin 10.6 (L) 13.0 - 17.0 g/dL   HCT 33.6 (L) 39.0 - 52.0 %   MCV 91.1 78.0 -  100.0 fL   MCH 28.7 26.0 - 34.0 pg   MCHC 31.5 30.0 - 36.0 g/dL   RDW 15.3 11.5 - 15.5 %   Platelets 252 150 - 400 K/uL  Comprehensive metabolic panel  Result Value Ref Range   Sodium 135 135 - 145 mmol/L   Potassium 5.4 (H) 3.5 - 5.1 mmol/L   Chloride 96 (L) 101 - 111 mmol/L   CO2 26 22 - 32 mmol/L   Glucose, Bld 185 (H) 65 - 99 mg/dL   BUN 91 (H) 6 - 20 mg/dL   Creatinine, Ser 4.82 (H) 0.61 - 1.24 mg/dL   Calcium 9.7 8.9 - 10.3 mg/dL   Total Protein 6.4 (L) 6.5 - 8.1 g/dL   Albumin 3.1 (L) 3.5 - 5.0 g/dL   AST 23 15 - 41 U/L   ALT 16 (L) 17 - 63 U/L   Alkaline Phosphatase 96 38 - 126 U/L   Total Bilirubin 0.7 0.3 - 1.2 mg/dL   GFR calc non Af Amer 11 (L) >60 mL/min   GFR calc Af Amer 13 (L) >60 mL/min   Anion gap 13 5 - 15  Protime-INR  Result Value Ref Range   Prothrombin Time 15.0 11.6 - 15.2 seconds   INR 1.16 0.00 - 1.49  Type and screen  Result Value Ref Range   ABO/RH(D) O POS    Antibody Screen NEG    Sample Expiration 10/13/2015    Extend sample reason NO TRANSFUSIONS OR PREGNANCY IN THE PAST 3 MONTHS    Results for orders placed or performed during the hospital encounter of 10/11/15  Brain natriuretic peptide  Result Value Ref Range   B Natriuretic Peptide 207.9 (H) 0.0 - 100.0 pg/mL  I-Stat Chem 8, ED  Result Value Ref Range   Sodium 136 135 - 145 mmol/L   Potassium 4.6 3.5 - 5.1 mmol/L   Chloride 98 (L) 101 - 111 mmol/L   BUN 92 (H) 6 - 20 mg/dL   Creatinine, Ser 4.90 (H) 0.61 - 1.24 mg/dL   Glucose, Bld 129 (H) 65 - 99 mg/dL   Calcium, Ion 1.24 1.13 - 1.30 mmol/L   TCO2 28 0 - 100 mmol/L   Hemoglobin 10.5 (L) 13.0 - 17.0 g/dL   HCT 31.0 (L) 39.0 - 52.0 %  I-stat troponin, ED  Result Value Ref Range   Troponin i, poc 0.03 0.00 - 0.08 ng/mL   Comment 3           Ct Abdomen Pelvis Wo Contrast  09/26/2015  CLINICAL DATA:  Abdominal pain with decreased appetite following esophageal dilatation procedure EXAM: CT ABDOMEN AND PELVIS WITHOUT CONTRAST  TECHNIQUE: Multidetector CT imaging of the abdomen and pelvis was performed following the standard protocol without IV contrast. COMPARISON:  None. FINDINGS: Lung bases demonstrate evidence of right lower lobe pneumonia. Some fullness is also noted anterior to the right main pulmonary artery which likely represents lymphadenopathy. Dedicated CT of the chest is recommended. The liver, gallbladder, spleen, adrenal glands and pancreas are all normal in their CT appearance. The kidneys are well visualized bilaterally and demonstrate renal vascular calcifications. No renal stones or  obstructive changes are seen. The bladder is partially distended. Significant calcifications of the seminal vesicles are noted bilaterally. The vas deferens are also calcified. Aortoiliac calcifications are seen. A small umbilical hernia is noted containing fat. The appendix is within normal limits. No significant diverticular change is noted. Stomach demonstrates clips consistent with the prior endoscopy procedure. IMPRESSION: Right lower lobe pneumonia as well as changes consistent with lymphadenopathy in the region of the right hilum and adjacent to the right pulmonary artery. CT of the chest is recommended for further evaluation. Chronic changes in the abdomen and pelvis. No acute abnormality is seen. Electronically Signed   By: Inez Catalina M.D.   On: 09/26/2015 20:29   Dg Chest 2 View  10/11/2015  CLINICAL DATA:  Shortness of breath with productive cough, worse yesterday. Preoperative labs done yesterday for right lung biopsy on Monday. Smoker. EXAM: CHEST  2 VIEW COMPARISON:  Chest 10/10/2015.  CT chest 09/26/2015. FINDINGS: 2.6 cm diameter nodule in the left upper lung. 2.1 cm nodule in the left mid lung. Right hilar mass lesion with postobstructive change in the right lung base. Features are similar to previous study. Emphysematous changes in the aerated portions of the lungs. Borderline heart size with normal pulmonary  vascularity. No pneumothorax. Degenerative changes in the spine. IMPRESSION: Right hilar mass lesion with postobstructive change in the right lung base. Nodules in the left upper lung and left mid lung. No change since prior study. Findings likely malignant. Electronically Signed   By: Lucienne Capers M.D.   On: 10/11/2015 05:36   Dg Chest 2 View  10/10/2015  CLINICAL DATA:  Preoperative examination prior to left lung biopsy EXAM: CHEST  2 VIEW COMPARISON:  CT scan of the chest of Aldonia Keeven 7, 2017 FINDINGS: There is volume loss on the right which is not entirely new but more conspicuous than on the previous study. This is in part due to elevation of the right hemidiaphragm. There is right basilar atelectasis or pneumonia. On the left the known upper lobe mass is visible overlying the posterior aspect of the second rib. An additional subtle masses visible inferior to this. There is no definite pleural effusion though a small effusion on the right is not excluded. There is soft tissue fullness in the right paratracheal and right suprahilar region consistent with lymphadenopathy. The heart is not enlarged. The pulmonary vascularity is not engorged. There is mild multilevel degenerative disc disease of the thoracic spine. IMPRESSION: 1. Increased volume loss on the right likely due to postobstructive atelectasis as well as elevated right hemidiaphragm. 2. Persistent bulky right paratracheal lymphadenopathy. Two upper lobe left lung masses remain visible. 3. No evidence of CHF. Electronically Signed   By: David  Martinique M.D.   On: 10/10/2015 14:57   Ct Chest Wo Contrast  09/26/2015  CLINICAL DATA:  Shortness of breath. CT abdomen today demonstrating right lower lobe pneumonia and right hilar adenopathy. EXAM: CT CHEST WITHOUT CONTRAST TECHNIQUE: Multidetector CT imaging of the chest was performed following the standard protocol without IV contrast. COMPARISON:  Included lung bases from CT abdomen earlier today.  FINDINGS: Mediastinum/Lymph Nodes: Extensive mediastinal adenopathy and masslike opacity in the right hilar and right mediastinum, with marked paratracheal thickening and circumferential soft tissue density posterior lateral to the mid distal trachea, encasing the trachea at the carina, and causing luminal narrowing of the right mainstem bronchus. Exact measurements are difficult due to the large nature, however measures at least 9.5 cm in greatest dimension. Individual lymph nodes  versus pulmonary mass cannot be discretely identified given lack contrast in the conglomerate soft tissue nature. Separate enlarged AP window lymph node measures 1.5 cm short axis with additional superior mediastinal, prevascular, and AP window nodes. There is abnormal left axillary adenopathy. Multiple small right axillary lymph nodes also seen. Mild cardiomegaly. Moderate coronary artery calcifications. Atherosclerosis of the thoracic aorta which is normal in caliber. Physiologic pericardial fluid. Lungs/Pleura: Abnormal right hilar soft tissue density with spiculation, a discrete right lung nodule or mass is not seen due to the conglomerate nature and lack of intravenous contrast. There luminal narrowing and near complete occlusion of the right mainstem bronchus secondary to bronchial encasement. Mass encases the bronchus intermedius with marked narrowing of the middle and lower lobe bronchi. Volume loss in the right lung with small right pleural effusion. Ill-defined and nodular right lower lobe opaciteis. 2 spiculated nodules in the left upper lobe. More superior nodule measures 2.4 x 1.8 x 2.2 cm with peripheral cavitation. The more inferior nodule measures 2.1 x 1.6 x 2.0 cm. Both nodules have spiculated borders. Upper abdomen: Evaluated earlier this day at with CT abdomen/pelvis. Musculoskeletal: No destructive lytic or discrete blastic osseous lesion. Mild diffuse increase in osseous density, likely sequela of renal  osteodystrophy. IMPRESSION: 1. 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 Large right hilar/mediastinal masslike soft tissue density causing encasement and narrowing of the right mainstem bronchus and bronchus intermedius. Discrete mass difficult separate from the extensive multifocal mediastinal adenopathy. The right lower lobe opacities may be postobstructive atelectasis, spread of malignancy or pneumonia. Suspect bronchogenic malignancy, however primary lesion difficult to discern. 2. Two spiculated left upper lobe nodules, both approximately 2 cm, primary versus metastatic disease. These results were called by telephone at the time of interpretation on 09/26/2015 at 10:19 pm to Dr. Milton Ferguson , who verbally acknowledged these results. Electronically Signed   By: Jeb Levering M.D.   On: 09/26/2015 22:20    Medications  guaiFENesin (MUCINEX) 12 hr tablet 1,200 mg (1,200 mg Oral Given 10/11/15 0327)  albuterol (PROVENTIL) (2.5 MG/3ML) 0.083% nebulizer solution 5 mg (5 mg Nebulization Given 10/11/15 0259)  ipratropium (ATROVENT) nebulizer solution 0.5 mg (0.5 mg Nebulization Given 10/11/15 0259)  loratadine (CLARITIN) tablet 10 mg (10 mg Oral Given 10/11/15 0258)  predniSONE (DELTASONE) tablet 60 mg (60 mg Oral Given 10/11/15 0258)   O2 saturation at or above baseline on 2 liters.  Lungs improved post meds.  Will d/c Augmentin steroids and Humibid.  Close follow up with your PMD and pulmonology.  Strict return precautions   Hennesy Sobalvarro, MD 10/11/15 [phone]

## 2015-10-13 ENCOUNTER — Ambulatory Visit (HOSPITAL_COMMUNITY): Payer: Medicare Other | Admitting: Vascular Surgery

## 2015-10-13 ENCOUNTER — Encounter (HOSPITAL_COMMUNITY)
Admission: RE | Disposition: A | Discharge status: Home / Self Care | Payer: Self-pay | Source: Ambulatory Visit | Attending: Thoracic Surgery (Cardiothoracic Vascular Surgery)

## 2015-10-13 ENCOUNTER — Observation Stay (HOSPITAL_COMMUNITY)
Admission: RE | Admit: 2015-10-13 | Discharge: 2015-10-14 | Disposition: A | Discharge status: Home / Self Care | Payer: Medicare Other | Source: Ambulatory Visit | Attending: Thoracic Surgery (Cardiothoracic Vascular Surgery) | Admitting: Thoracic Surgery (Cardiothoracic Vascular Surgery)

## 2015-10-13 ENCOUNTER — Encounter (HOSPITAL_COMMUNITY): Payer: Self-pay | Admitting: *Deleted

## 2015-10-13 ENCOUNTER — Ambulatory Visit (HOSPITAL_COMMUNITY): Payer: Medicare Other | Admitting: Anesthesiology

## 2015-10-13 ENCOUNTER — Encounter (HOSPITAL_COMMUNITY): Payer: Self-pay | Admitting: Oncology

## 2015-10-13 ENCOUNTER — Other Ambulatory Visit (HOSPITAL_COMMUNITY): Payer: Self-pay | Admitting: Oncology

## 2015-10-13 DIAGNOSIS — E1140 Type 2 diabetes mellitus with diabetic neuropathy, unspecified: Secondary | ICD-10-CM | POA: Insufficient documentation

## 2015-10-13 DIAGNOSIS — I739 Peripheral vascular disease, unspecified: Secondary | ICD-10-CM

## 2015-10-13 DIAGNOSIS — R590 Localized enlarged lymph nodes: Secondary | ICD-10-CM

## 2015-10-13 DIAGNOSIS — R918 Other nonspecific abnormal finding of lung field: Secondary | ICD-10-CM

## 2015-10-13 DIAGNOSIS — I509 Heart failure, unspecified: Secondary | ICD-10-CM | POA: Diagnosis not present

## 2015-10-13 DIAGNOSIS — E785 Hyperlipidemia, unspecified: Secondary | ICD-10-CM | POA: Insufficient documentation

## 2015-10-13 DIAGNOSIS — C3491 Malignant neoplasm of unspecified part of right bronchus or lung: Principal | ICD-10-CM | POA: Insufficient documentation

## 2015-10-13 DIAGNOSIS — E1122 Type 2 diabetes mellitus with diabetic chronic kidney disease: Secondary | ICD-10-CM | POA: Insufficient documentation

## 2015-10-13 DIAGNOSIS — E1142 Type 2 diabetes mellitus with diabetic polyneuropathy: Secondary | ICD-10-CM

## 2015-10-13 DIAGNOSIS — N185 Chronic kidney disease, stage 5: Secondary | ICD-10-CM | POA: Insufficient documentation

## 2015-10-13 DIAGNOSIS — I132 Hypertensive heart and chronic kidney disease with heart failure and with stage 5 chronic kidney disease, or end stage renal disease: Secondary | ICD-10-CM | POA: Diagnosis not present

## 2015-10-13 DIAGNOSIS — C801 Malignant (primary) neoplasm, unspecified: Secondary | ICD-10-CM | POA: Diagnosis not present

## 2015-10-13 DIAGNOSIS — C7971 Secondary malignant neoplasm of right adrenal gland: Secondary | ICD-10-CM | POA: Insufficient documentation

## 2015-10-13 DIAGNOSIS — C3490 Malignant neoplasm of unspecified part of unspecified bronchus or lung: Secondary | ICD-10-CM | POA: Diagnosis present

## 2015-10-13 DIAGNOSIS — R222 Localized swelling, mass and lump, trunk: Secondary | ICD-10-CM | POA: Diagnosis not present

## 2015-10-13 DIAGNOSIS — C771 Secondary and unspecified malignant neoplasm of intrathoracic lymph nodes: Secondary | ICD-10-CM | POA: Diagnosis not present

## 2015-10-13 HISTORY — PX: VIDEO BRONCHOSCOPY WITH ENDOBRONCHIAL ULTRASOUND: SHX6177

## 2015-10-13 HISTORY — PX: MEDIASTINOSCOPY: SHX5086

## 2015-10-13 LAB — POCT I-STAT, CHEM 8
BUN: 108 mg/dL — ABNORMAL HIGH (ref 6–20)
Calcium, Ion: 1.26 mmol/L (ref 1.13–1.30)
Chloride: 97 mmol/L — ABNORMAL LOW (ref 101–111)
Creatinine, Ser: 4 mg/dL — ABNORMAL HIGH (ref 0.61–1.24)
Glucose, Bld: 165 mg/dL — ABNORMAL HIGH (ref 65–99)
HEMATOCRIT: 36 % — AB (ref 39.0–52.0)
HEMOGLOBIN: 12.2 g/dL — AB (ref 13.0–17.0)
Potassium: 4.9 mmol/L (ref 3.5–5.1)
Sodium: 137 mmol/L (ref 135–145)
TCO2: 28 mmol/L (ref 0–100)

## 2015-10-13 LAB — GLUCOSE, CAPILLARY: GLUCOSE-CAPILLARY: 211 mg/dL — AB (ref 65–99)

## 2015-10-13 SURGERY — BRONCHOSCOPY, WITH EBUS
Anesthesia: General | Site: Chest

## 2015-10-13 MED ORDER — ONDANSETRON HCL 4 MG/2ML IJ SOLN
4.0000 mg | Freq: Three times a day (TID) | INTRAMUSCULAR | Status: DC | PRN
  Administered 2015-10-13: 4 mg via INTRAVENOUS
  Filled 2015-10-13 (×2): qty 2

## 2015-10-13 MED ORDER — ATENOLOL 25 MG PO TABS: 50.0000 mg | ORAL_TABLET | Freq: Every day | ORAL | Status: DC

## 2015-10-13 MED ORDER — HYDROCODONE-ACETAMINOPHEN 5-325 MG PO TABS: 1.0000 | ORAL_TABLET | Freq: Four times a day (QID) | ORAL | Status: DC | PRN

## 2015-10-13 MED ORDER — ONDANSETRON HCL 4 MG/2ML IJ SOLN
4.0000 mg | Freq: Once | INTRAMUSCULAR | Status: DC | PRN
Start: 2015-10-13 — End: 2015-10-13

## 2015-10-13 MED ORDER — PROCHLORPERAZINE MALEATE 10 MG PO TABS: 10.0000 mg | ORAL_TABLET | Freq: Four times a day (QID) | ORAL | Status: AC | PRN

## 2015-10-13 MED ORDER — MIDAZOLAM HCL 5 MG/5ML IJ SOLN
INTRAMUSCULAR | Status: DC | PRN
  Administered 2015-10-13: 1 mg via INTRAVENOUS

## 2015-10-13 MED ORDER — 0.9 % SODIUM CHLORIDE (POUR BTL) OPTIME
TOPICAL | Status: DC | PRN
  Administered 2015-10-13 (×2): 1000 mL

## 2015-10-13 MED ORDER — ROCURONIUM BROMIDE 100 MG/10ML IV SOLN
INTRAVENOUS | Status: DC | PRN
  Administered 2015-10-13: 40 mg via INTRAVENOUS
  Administered 2015-10-13: 10 mg via INTRAVENOUS

## 2015-10-13 MED ORDER — CHLORHEXIDINE GLUCONATE 4 % EX LIQD: 60.0000 mL | Freq: Once | CUTANEOUS | Status: DC

## 2015-10-13 MED ORDER — EPINEPHRINE HCL 1 MG/ML IJ SOLN
INTRAMUSCULAR | Status: AC
  Filled 2015-10-13: qty 1

## 2015-10-13 MED ORDER — AMLODIPINE BESYLATE 10 MG PO TABS
10.0000 mg | ORAL_TABLET | Freq: Every day | ORAL | Status: DC
  Administered 2015-10-14: 10 mg via ORAL
  Filled 2015-10-13: qty 1

## 2015-10-13 MED ORDER — SILVER SULFADIAZINE 1 % EX CREA
1.0000 "application " | TOPICAL_CREAM | Freq: Two times a day (BID) | CUTANEOUS | Status: DC | PRN
  Filled 2015-10-13: qty 85

## 2015-10-13 MED ORDER — OXYCODONE HCL 5 MG PO TABS: 5.0000 mg | ORAL_TABLET | Freq: Once | ORAL | Status: DC | PRN

## 2015-10-13 MED ORDER — ATENOLOL 25 MG PO TABS
50.0000 mg | ORAL_TABLET | Freq: Every day | ORAL | Status: DC
  Administered 2015-10-14: 50 mg via ORAL
  Filled 2015-10-13: qty 2

## 2015-10-13 MED ORDER — HEMOSTATIC AGENTS (NO CHARGE) OPTIME
TOPICAL | Status: DC | PRN
  Administered 2015-10-13: 1 via TOPICAL

## 2015-10-13 MED ORDER — ONDANSETRON HCL 8 MG PO TABS: 8.0000 mg | ORAL_TABLET | Freq: Three times a day (TID) | ORAL | Status: DC | PRN

## 2015-10-13 MED ORDER — SODIUM CHLORIDE 0.9 % IV SOLN
INTRAVENOUS | Status: DC
  Administered 2015-10-13: 18:00:00 via INTRAVENOUS

## 2015-10-13 MED ORDER — PANTOPRAZOLE SODIUM 40 MG PO TBEC
40.0000 mg | DELAYED_RELEASE_TABLET | Freq: Every day | ORAL | Status: DC
  Administered 2015-10-14: 40 mg via ORAL
  Filled 2015-10-13: qty 1

## 2015-10-13 MED ORDER — CLONIDINE HCL 0.2 MG PO TABS
0.2000 mg | ORAL_TABLET | Freq: Two times a day (BID) | ORAL | Status: DC
  Administered 2015-10-13 – 2015-10-14 (×2): 0.2 mg via ORAL
  Filled 2015-10-13 (×2): qty 1

## 2015-10-13 MED ORDER — GABAPENTIN 300 MG PO CAPS: 300.0000 mg | ORAL_CAPSULE | Freq: Every day | ORAL | Status: DC | PRN

## 2015-10-13 MED ORDER — PROPOFOL 10 MG/ML IV BOLUS
INTRAVENOUS | Status: DC | PRN
  Administered 2015-10-13: 130 mg via INTRAVENOUS

## 2015-10-13 MED ORDER — HYDROCODONE-ACETAMINOPHEN 7.5-325 MG/15ML PO SOLN: 10.0000 mL | ORAL | Status: DC | PRN

## 2015-10-13 MED ORDER — AMOXICILLIN-POT CLAVULANATE 875-125 MG PO TABS
1.0000 | ORAL_TABLET | Freq: Two times a day (BID) | ORAL | Status: DC
  Administered 2015-10-13 – 2015-10-14 (×2): 1 via ORAL
  Filled 2015-10-13 (×2): qty 1

## 2015-10-13 MED ORDER — HYDROCODONE-ACETAMINOPHEN 7.5-325 MG/15ML PO SOLN
10.0000 mL | Freq: Four times a day (QID) | ORAL | Status: DC | PRN
  Administered 2015-10-13: 10 mL via ORAL
  Filled 2015-10-13: qty 15

## 2015-10-13 MED ORDER — FENTANYL CITRATE (PF) 100 MCG/2ML IJ SOLN: 25.0000 ug | INTRAMUSCULAR | Status: DC | PRN

## 2015-10-13 MED ORDER — GLIPIZIDE 5 MG PO TABS
5.0000 mg | ORAL_TABLET | Freq: Two times a day (BID) | ORAL | Status: DC
  Filled 2015-10-13: qty 1

## 2015-10-13 MED ORDER — SUGAMMADEX SODIUM 200 MG/2ML IV SOLN
INTRAVENOUS | Status: AC
  Filled 2015-10-13: qty 2

## 2015-10-13 MED ORDER — OXYCODONE HCL 5 MG/5ML PO SOLN: 5.0000 mg | Freq: Once | ORAL | Status: DC | PRN

## 2015-10-13 MED ORDER — FENTANYL CITRATE (PF) 250 MCG/5ML IJ SOLN
INTRAMUSCULAR | Status: AC
  Filled 2015-10-13: qty 5

## 2015-10-13 MED ORDER — PHENYLEPHRINE HCL 10 MG/ML IJ SOLN
INTRAMUSCULAR | Status: DC | PRN
  Administered 2015-10-13 (×4): 80 ug via INTRAVENOUS

## 2015-10-13 MED ORDER — ONDANSETRON HCL 4 MG/2ML IJ SOLN
INTRAMUSCULAR | Status: AC
  Filled 2015-10-13: qty 2

## 2015-10-13 MED ORDER — GUAIFENESIN 200 MG PO TABS
400.0000 mg | ORAL_TABLET | Freq: Four times a day (QID) | ORAL | Status: DC
  Administered 2015-10-13 – 2015-10-14 (×2): 400 mg via ORAL
  Filled 2015-10-13 (×4): qty 2

## 2015-10-13 MED ORDER — MIDAZOLAM HCL 2 MG/2ML IJ SOLN
INTRAMUSCULAR | Status: AC
  Filled 2015-10-13: qty 2

## 2015-10-13 MED ORDER — ONDANSETRON HCL 4 MG PO TABS
4.0000 mg | ORAL_TABLET | Freq: Three times a day (TID) | ORAL | Status: DC
  Filled 2015-10-13 (×2): qty 1

## 2015-10-13 MED ORDER — LIDOCAINE HCL (CARDIAC) 20 MG/ML IV SOLN
INTRAVENOUS | Status: DC | PRN
  Administered 2015-10-13: 40 mg via INTRAVENOUS

## 2015-10-13 MED ORDER — SUGAMMADEX SODIUM 200 MG/2ML IV SOLN
INTRAVENOUS | Status: DC | PRN
  Administered 2015-10-13: 150 mg via INTRAVENOUS

## 2015-10-13 MED ORDER — SODIUM CHLORIDE 0.9 % IV SOLN
INTRAVENOUS | Status: DC
  Administered 2015-10-13 (×2): via INTRAVENOUS

## 2015-10-13 MED ORDER — PROCHLORPERAZINE EDISYLATE 5 MG/ML IJ SOLN
10.0000 mg | Freq: Four times a day (QID) | INTRAMUSCULAR | Status: DC | PRN
  Filled 2015-10-13 (×2): qty 2

## 2015-10-13 MED ORDER — PREDNISONE 20 MG PO TABS
20.0000 mg | ORAL_TABLET | Freq: Every day | ORAL | Status: DC
  Administered 2015-10-14: 20 mg via ORAL
  Filled 2015-10-13: qty 1

## 2015-10-13 MED ORDER — ONDANSETRON HCL 4 MG/2ML IJ SOLN
INTRAMUSCULAR | Status: AC
Start: 2015-10-13 — End: 2015-10-13
  Administered 2015-10-13: 4 mg
  Filled 2015-10-13: qty 2

## 2015-10-13 MED ORDER — PHENYLEPHRINE HCL 10 MG/ML IJ SOLN
10.0000 mg | INTRAVENOUS | Status: DC | PRN
  Administered 2015-10-13: 30 ug/min via INTRAVENOUS

## 2015-10-13 MED ORDER — FENTANYL CITRATE (PF) 100 MCG/2ML IJ SOLN
INTRAMUSCULAR | Status: DC | PRN
  Administered 2015-10-13 (×3): 50 ug via INTRAVENOUS

## 2015-10-13 MED ORDER — DEXTROSE 5 % IV SOLN
1.5000 g | INTRAVENOUS | Status: AC
  Administered 2015-10-13: 1.5 g via INTRAVENOUS
  Filled 2015-10-13: qty 1.5

## 2015-10-13 SURGICAL SUPPLY — 68 items
ADH SKN CLS APL DERMABOND .7 (GAUZE/BANDAGES/DRESSINGS) ×1
APPLIER CLIP LOGIC TI 5 (MISCELLANEOUS) IMPLANT
APR CLP MED LRG 33X5 (MISCELLANEOUS)
BALL CTTN LRG ABS STRL LF (GAUZE/BANDAGES/DRESSINGS)
BLADE SURG 15 STRL LF DISP TIS (BLADE) ×1 IMPLANT
BLADE SURG 15 STRL SS (BLADE) ×3
BRUSH CYTOL CELLEBRITY 1.5X140 (MISCELLANEOUS) IMPLANT
CANISTER SUCTION 2500CC (MISCELLANEOUS) ×4 IMPLANT
CLIP TI MEDIUM 6 (CLIP) IMPLANT
CONT SPEC 4OZ CLIKSEAL STRL BL (MISCELLANEOUS) ×15 IMPLANT
COTTONBALL LRG STERILE PKG (GAUZE/BANDAGES/DRESSINGS) IMPLANT
COVER DOME SNAP 22 D (MISCELLANEOUS) ×3 IMPLANT
COVER SURGICAL LIGHT HANDLE (MISCELLANEOUS) ×4 IMPLANT
COVER TABLE BACK 60X90 (DRAPES) ×3 IMPLANT
DERMABOND ADVANCED (GAUZE/BANDAGES/DRESSINGS) ×2
DERMABOND ADVANCED .7 DNX12 (GAUZE/BANDAGES/DRESSINGS) ×1 IMPLANT
DRAPE CHEST BREAST 15X10 FENES (DRAPES) ×3 IMPLANT
ELECT CAUTERY BLADE 6.4 (BLADE) ×2 IMPLANT
ELECT REM PT RETURN 9FT ADLT (ELECTROSURGICAL) ×3
ELECTRODE REM PT RTRN 9FT ADLT (ELECTROSURGICAL) ×1 IMPLANT
FILTER STRAW FLUID ASPIR (MISCELLANEOUS) IMPLANT
FORCEPS BIOP RJ4 1.8 (CUTTING FORCEPS) IMPLANT
GAUZE SPONGE 4X4 12PLY STRL (GAUZE/BANDAGES/DRESSINGS) ×3 IMPLANT
GAUZE SPONGE 4X4 16PLY XRAY LF (GAUZE/BANDAGES/DRESSINGS) ×3 IMPLANT
GLOVE SURG SIGNA 7.5 PF LTX (GLOVE) ×6 IMPLANT
GOWN STRL REUS W/ TWL LRG LVL3 (GOWN DISPOSABLE) ×1 IMPLANT
GOWN STRL REUS W/ TWL XL LVL3 (GOWN DISPOSABLE) ×2 IMPLANT
GOWN STRL REUS W/TWL LRG LVL3 (GOWN DISPOSABLE)
GOWN STRL REUS W/TWL XL LVL3 (GOWN DISPOSABLE) ×12
HEMOSTAT SURGICEL 2X14 (HEMOSTASIS) ×2 IMPLANT
KIT BASIN OR (CUSTOM PROCEDURE TRAY) ×3 IMPLANT
KIT CLEAN ENDO COMPLIANCE (KITS) ×6 IMPLANT
KIT ROOM TURNOVER OR (KITS) ×6 IMPLANT
MARKER SKIN DUAL TIP RULER LAB (MISCELLANEOUS) ×3 IMPLANT
NDL BIOPSY TRANSBRONCH 21G (NEEDLE) IMPLANT
NDL BLUNT 18X1 FOR OR ONLY (NEEDLE) IMPLANT
NEEDLE 22X1 1/2 (OR ONLY) (NEEDLE) IMPLANT
NEEDLE BIOPSY TRANSBRONCH 21G (NEEDLE) IMPLANT
NEEDLE BLUNT 18X1 FOR OR ONLY (NEEDLE) IMPLANT
NEEDLE SONO TIP II EBUS (NEEDLE) ×3 IMPLANT
NS IRRIG 1000ML POUR BTL (IV SOLUTION) ×6 IMPLANT
OIL SILICONE PENTAX (PARTS (SERVICE/REPAIRS)) ×2 IMPLANT
PACK SURGICAL SETUP 50X90 (CUSTOM PROCEDURE TRAY) ×3 IMPLANT
PAD ARMBOARD 7.5X6 YLW CONV (MISCELLANEOUS) ×8 IMPLANT
PENCIL BUTTON HOLSTER BLD 10FT (ELECTRODE) ×3 IMPLANT
SPONGE INTESTINAL PEANUT (DISPOSABLE) ×2 IMPLANT
SUT SILK 2 0 (SUTURE)
SUT SILK 2-0 18XBRD TIE 12 (SUTURE) IMPLANT
SUT VIC AB 2-0 CT1 27 (SUTURE)
SUT VIC AB 2-0 CT1 TAPERPNT 27 (SUTURE) IMPLANT
SUT VIC AB 3-0 SH 18 (SUTURE) ×3 IMPLANT
SUT VICRYL 4-0 PS2 18IN ABS (SUTURE) ×3 IMPLANT
SWAB COLLECTION DEVICE MRSA (MISCELLANEOUS) IMPLANT
SYR 20CC LL (SYRINGE) ×3 IMPLANT
SYR 20ML ECCENTRIC (SYRINGE) ×3 IMPLANT
SYR 5ML LL (SYRINGE) ×1 IMPLANT
SYR 5ML LUER SLIP (SYRINGE) ×1 IMPLANT
SYR CONTROL 10ML LL (SYRINGE) IMPLANT
SYRINGE 10CC LL (SYRINGE) ×1 IMPLANT
TOWEL OR 17X24 6PK STRL BLUE (TOWEL DISPOSABLE) ×6 IMPLANT
TOWEL OR 17X26 10 PK STRL BLUE (TOWEL DISPOSABLE) ×1 IMPLANT
TRAP SPECIMEN MUCOUS 40CC (MISCELLANEOUS) ×3 IMPLANT
TUBE ANAEROBIC SPECIMEN COL (MISCELLANEOUS) IMPLANT
TUBE CONNECTING 12'X1/4 (SUCTIONS) ×1
TUBE CONNECTING 12X1/4 (SUCTIONS) ×2 IMPLANT
TUBE CONNECTING 20'X1/4 (TUBING) ×1
TUBE CONNECTING 20X1/4 (TUBING) ×2 IMPLANT
WATER STERILE IRR 1000ML POUR (IV SOLUTION) ×1 IMPLANT

## 2015-10-13 NOTE — Progress Notes (Signed)
      [address]       [place] [address]             [phone]      No complaints Has had some coughing but no hemoptysis Had extensive thick mucous evacuated during procedure BP 140/53 mmHg  Pulse 92  Temp(Src) 97.7 F (36.5 C)  Resp 21  Ht '5\' 4"'$  (1.626 m)  Wt 146 lb (66.225 kg)  BMI 25.05 kg/m2  SpO2 94%  Home in AM if no issues  Remo Lipps C. Roxan Hockey, MD [facility] [phone]

## 2015-10-13 NOTE — Anesthesia Procedure Notes (Signed)
Procedure Name: Intubation Date/Time: 10/13/2015 1:24 PM Performed by: Shirlyn Goltz Pre-anesthesia Checklist: Patient identified, Timeout performed, Emergency Drugs available, Suction available and Patient being monitored Patient Re-evaluated:Patient Re-evaluated prior to inductionOxygen Delivery Method: Circle system utilized Preoxygenation: Pre-oxygenation with 100% oxygen Intubation Type: IV induction Ventilation: Two handed mask ventilation required and Oral airway inserted - appropriate to patient size Laryngoscope Size: Mac and 3 Grade View: Grade I Tube type: Oral Tube size: 8.5 mm Number of attempts: 1 Airway Equipment and Method: Stylet Placement Confirmation: ETT inserted through vocal cords under direct vision,  positive ETCO2 and breath sounds checked- equal and bilateral Secured at: 22 cm Tube secured with: Tape Dental Injury: Teeth and Oropharynx as per pre-operative assessment

## 2015-10-13 NOTE — Transfer of Care (Signed)
Immediate Anesthesia Transfer of Care Note  Patient: [name]  Procedure(s) Performed: Procedure(s): VIDEO BRONCHOSCOPY WITH ENDOBRONCHIAL ULTRASOUND (N/A) MEDIASTINOSCOPY (N/A)  Patient Location: PACU  Anesthesia Type:General  Level of Consciousness: awake, alert , oriented and patient cooperative  Airway & Oxygen Therapy: Patient Spontanous Breathing and Patient connected to face mask oxygen  Post-op Assessment: Report given to RN, Post -op Vital signs reviewed and stable, Patient moving all extremities and Patient moving all extremities X 4  Post vital signs: Reviewed and stable  Last Vitals:  Filed Vitals:   10/13/15 1137 10/13/15 1520  BP: 151/66 144/66  Pulse: 80 90  Resp: 16 27    Complications: No apparent anesthesia complications

## 2015-10-13 NOTE — Telephone Encounter (Signed)
In hospital, they will supply

## 2015-10-13 NOTE — Anesthesia Postprocedure Evaluation (Signed)
Anesthesia Post Note  Patient: [name]  Procedure(s) Performed: Procedure(s) (LRB): VIDEO BRONCHOSCOPY WITH ENDOBRONCHIAL ULTRASOUND (N/A) MEDIASTINOSCOPY (N/A)  Patient location during evaluation: PACU Anesthesia Type: General Level of consciousness: awake, awake and alert and oriented Pain management: pain level controlled Vital Signs Assessment: post-procedure vital signs reviewed and stable Respiratory status: spontaneous breathing, nonlabored ventilation and respiratory function stable Anesthetic complications: no    Last Vitals:  Filed Vitals:   10/13/15 1605 10/13/15 1654  BP: 151/70 140/53  Pulse: 93 92  Temp:    Resp: 21     Last Pain:  Filed Vitals:   10/13/15 1655  PainSc: 4                  Burleigh Brockmann COKER

## 2015-10-13 NOTE — Brief Op Note (Signed)
10/13/2015  3:09 PM  PATIENT:  [name]  65 y.o. 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   Delay start of Pharmacological VTE agent (>24hrs) due to surgical blood loss or risk of bleeding: yes

## 2015-10-13 NOTE — Anesthesia Preprocedure Evaluation (Signed)
Anesthesia Evaluation  Patient identified by MRN, date of birth, ID band Patient awake    Reviewed: Allergy & Precautions, NPO status , Patient's Chart, lab work & pertinent test results  Airway Mallampati: II  TM Distance: >3 FB Neck ROM: Full    Dental  (+) Edentulous Upper, Edentulous Lower   Pulmonary Current Smoker,    + rhonchi        Cardiovascular hypertension,  Rhythm:Regular Rate:Normal     Neuro/Psych    GI/Hepatic   Endo/Other  diabetes  Renal/GU      Musculoskeletal   Abdominal   Peds  Hematology   Anesthesia Other Findings   Reproductive/Obstetrics                             Anesthesia Physical Anesthesia Plan  ASA: III  Anesthesia Plan: General   Post-op Pain Management:    Induction: Intravenous  Airway Management Planned: Oral ETT  Additional Equipment:   Intra-op Plan:   Post-operative Plan: Extubation in OR  Informed Consent: I have reviewed the patients History and Physical, chart, labs and discussed the procedure including the risks, benefits and alternatives for the proposed anesthesia with the patient or authorized representative who has indicated his/her understanding and acceptance.     Plan Discussed with: CRNA and Anesthesiologist  Anesthesia Plan Comments:         Anesthesia Quick Evaluation

## 2015-10-13 NOTE — Interval H&P Note (Signed)
History and Physical Interval Note:  10/13/2015 12:16 PM  [name]  has presented today for surgery, with the diagnosis of RIGHT HILAR MASS MEDIASTINAL ADENOPATHY  The various methods of treatment have been discussed with the patient and family. After consideration of risks, benefits and other options for treatment, the patient has consented to  Procedure(s): VIDEO BRONCHOSCOPY WITH ENDOBRONCHIAL ULTRASOUND (N/A) POSSIBLE MEDIASTINOSCOPY (N/A) as a surgical intervention .  The patient's history has been reviewed, patient examined, no change in status, stable for surgery.  I have reviewed the patient's chart and labs.  Questions were answered to the patient's satisfaction.     Melrose Nakayama

## 2015-10-13 NOTE — H&P (View-Only) (Signed)
PCP is Chevis Pretty, FNP Referring Provider is Baird Cancer, PA-C  Chief Complaint  Patient presents with  . Lung Mass    Surgical eval, CT Chest/ABD 09/26/2015    HPI: [name] is sent for consultation regarding a right hilar mass and mediastinal adenopathy.  [name] is a 65 year old man with an extensive past medical history, including tobacco abuse (100 pack years), hypertension, hyperlipidemia, type 2 diabetes complicated by neuropathy, vasculopathy and nephropathy, peripheral vascular disease, history of right BKA, left carotid endarterectomy, chronic anemia, coronary artery disease, stage V chronic kidney disease, congestive heart failure.  He has been having difficulty swallowing since about the first of the year. He complains of shortness of breath, wheezing, and general malaise. He has lost 24 pounds in the past 3 months. He also has new swelling in his left arm. He denies any chest pain, pressure, or tightness. He had a CT of the abdomen as part of the workup for his swallowing issues and abdominal pain. There was a mass noted in the chest. A CT chest showed a large right hilar mass and massive mediastinal adenopathy.    Zubrod Score: At the time of surgery this patient's most appropriate activity status/level should be described as: '[]'$     0    Normal activity, no symptoms '[]'$     1    Restricted in physical strenuous activity but ambulatory, able to do out light work '[]'$     2    Ambulatory and capable of self care, unable to do work activities, up and about >50 % of waking hours                              '[]'$     3    Only limited self care, in bed greater than 50% of waking hours '[x]'$     4    Completely disabled, no self care, confined to bed or chair '[]'$     5    Moribund   Past Medical History  Diagnosis Date  . Diverticulosis 03/04/10  . Peripheral vascular disease, unspecified ([place])   . Undiagnosed cardiac murmurs   . Other symptoms involving  cardiovascular system   . HLD (hyperlipidemia)   . HTN (hypertension)   . Type II or unspecified type diabetes mellitus without mention of complication, not stated as uncontrolled   . Ulcer   . Carotid artery occlusion   . Umbilical hernia now    has not been repaired  . Blood transfusion   . AVM (arteriovenous malformation) of colon with hemorrhage   . Gastritis   . Internal hemorrhoids   . Right carotid bruit   . Shortness of breath     noted /w low Hgb  . GERD (gastroesophageal reflux disease)   . Chronic kidney disease     renal failure /w osteomyelitis- 2010  . Arthritis     back  . Anemia     8/3,4,5- blood transfusion- [facility], colonoscopy- done & found 2 areas of bleeding   . Tobacco use disorder   . Anxiety   . CHF (congestive heart failure) ([place])     3 yrs ago  . Irregular heart beat   . Iron deficiency anemia due to chronic blood loss 10/07/2010  . Chronic renal disease, stage 5, glomerular filtration rate less than or equal to 15 mL/min/1.73 square meter ([place]) 10/07/2010    Past Surgical History  Procedure Laterality Date  .  Colonoscopy  03/04/2010    Dr. Princella Pellegrini AMV without mention of ablation, scattered diverticula, internal hemorrhoids  . Bilateral common superficial femoral and profudus artery endarterectomies      2003    Dr. Sherren Mocha Early  . Esophagogastroduodenoscopy  03/2000    Dr. Tharon Aquas gastritis, H.Pylori gastritis, ?treated  . Esophagogastroduodenoscopy  05/2006    Dr. Marcello Fennel recurrent GI bleed. antral gastritis, single gastric AVM s/p APC, CLO results?  . Colonoscopy  11/2004    Dr. Barkley Bruns  . 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 Both of these are ablated with argon plasma coagulator./ Small external hemorrhoids  . Esophagogastroduodenoscopy  01/21/2012    SLF: MILD TO  MAODERATE Gastritis/ SLOW GIB LIKEY DUE TO PT BEING ON ASA ND SUBSEQUENT BLOOD/ LOSS FROM AVMS IN STOMACH AND COLON AS WELL AS GASTRITIS  . Enteroscopy  01/21/2012    Procedure: ENTEROSCOPY;  Surgeon: Danie Binder, MD;  [facility];  Service: Endoscopy;;  . No past surgeries    . Vascular surgery      rt bka and rt 1st toe amputation  . Below knee leg amputation      2010, wears prosthesis   . Endarterectomy  02/09/2012    Procedure: ENDARTERECTOMY CAROTID;  Surgeon: Rosetta Posner, MD;  Location: [facility];  Service: Vascular;  Laterality: Left;  . Carotid endarterectomy Left 02-09-12  . Esophagogastroduodenoscopy N/A 09/03/2013    Mild chronic gastritis. benign gastric polyps  . Givens capsule study N/A 09/03/2013    Dr. Oneida Alar: small bowel and colonic AVMs. no active bleeding  . Esophagogastroduodenoscopy N/A 08/19/2015    Dr. Oneida Alar: 2 large gastric AVMs in fundus, 1 actively bleeding s/p APC and clip placement, empiric Savary dilation  . Savory dilation N/A 08/19/2015    Procedure: SAVORY DILATION;  Surgeon: Danie Binder, MD;  [facility];  Service: Endoscopy;  Laterality: N/A;  . Flexible bronchoscopy N/A 09/29/2015    Procedure: FLEXIBLE BRONCHOSCOPY;  Surgeon: Sinda Du, MD;  [facility];  Service: Cardiopulmonary;  Laterality: N/A;  . Bronchial brushings  09/29/2015    Procedure: BRONCHIAL BRUSHINGS;  Surgeon: Sinda Du, MD;  [facility];  Service: Cardiopulmonary;;  Tracheal and left lung  . Bronchial washings  09/29/2015    Procedure: BRONCHIAL WASHINGS;  Surgeon: Sinda Du, MD;  [facility];  Service: Cardiopulmonary;;  Bilatersl washings    Family History  Problem Relation Age of Onset  . Colon cancer Brother 44    deceased  . Hyperlipidemia Brother   . Hypertension Brother   . Lung cancer Sister     deceased, three primary cancers: lung, esophageal, lymphoma.   . Diabetes Sister   . Hypertension Sister    . Cancer Father     gallbladder  . Hyperlipidemia Father   . Hypertension Father   . Cancer Brother     unknown primary  . Cancer Sister     unknown primary  . Hyperlipidemia Sister   . Diabetes Mother   . Hypertension Mother     Social History Social History  Substance Use Topics  . Smoking status: Current Every Day Smoker -- 2.00 packs/day for 50 years    Types: Cigarettes  . Smokeless tobacco: Former Systems developer    Quit date: 02/08/2012  Comment: Smokes about 2 packs of tiny skinny cigarettes  . Alcohol Use: No    Current Outpatient Prescriptions  Medication Sig Dispense Refill  . acetaminophen (TYLENOL) 500 MG tablet Take 1,000 mg by mouth daily as needed for mild pain or headache.    Marland Kitchen amLODipine (NORVASC) 10 MG tablet TAKE 1 TABLET BY MOUTH DAILY, BEFORE BREAKFAST 90 tablet 1  . atenolol (TENORMIN) 100 MG tablet TAKE 1 TABLET (100 MG TOTAL) BY MOUTH DAILY. (Patient taking differently: TAKE 1/2 TABLET (50 MG TOTAL) BY MOUTH TWICE  DAILY.) 90 tablet 1  . Calcium Carbonate Antacid (TUMS ULTRA 1000 PO) Take 2 tablets by mouth daily as needed (indigestion).     . cloNIDine (CATAPRES) 0.2 MG tablet Take 1 tablet (0.2 mg total) by mouth 2 (two) times daily. 60 tablet 3  . diazepam (VALIUM) 5 MG tablet Take 1 tablet (5 mg total) by mouth once. Take one tablet 1 hours prior to exam then take another one 1 hour later 2 tablet 0  . gabapentin (NEURONTIN) 300 MG capsule Take 1 capsule (300 mg total) by mouth 3 (three) times daily. (Patient taking differently: Take 300 mg by mouth daily as needed (nerve pain). ) 270 capsule 3  . glipiZIDE (GLUCOTROL) 10 MG tablet Take 0.5 tablets (5 mg total) by mouth daily before breakfast. Takes one tablet in the Am and 1/2 tablet in the pm (Patient taking differently: Take 5 mg by mouth daily as needed (high blood sugar). ) 135 tablet 0  . HYDROcodone-acetaminophen (NORCO/VICODIN) 5-325 MG tablet Take 1 tablet by mouth every 8 (eight) hours as needed. 30  tablet 0  . levofloxacin (LEVAQUIN) 500 MG tablet Take 1 tablet (500 mg total) by mouth every other day. 2 tablet 0  . ondansetron (ZOFRAN) 4 MG tablet Take 1 tablet (4 mg total) by mouth 4 (four) times daily -  before meals and at bedtime. 120 tablet 1  . pantoprazole (PROTONIX) 40 MG tablet TAKE 1 TABLET BY MOUTH TWICE A DAY BEFORE MEAL 180 tablet 1  . polyethylene glycol (MIRALAX / GLYCOLAX) packet Take 17 g by mouth daily as needed for mild constipation (*tAKES TWICE DAILY SOMETIMES).     . silver sulfADIAZINE (SILVADENE) 1 % cream APPLY TO AFFECTED AREA TWICE A DAY (Patient taking differently: Apply 1 application topically 2 (two) times daily as needed (sore skin). ) 240 g 1  . valsartan (DIOVAN) 160 MG tablet Take 1 tablet (160 mg total) by mouth daily. 30 tablet 5  . Vitamin D, Ergocalciferol, (DRISDOL) 50000 units CAPS capsule Take 50,000 Units by mouth once a week. Takes on Saturdays.  0   No current facility-administered medications for this visit.    Allergies  Allergen Reactions  . Zocor [Simvastatin] Palpitations    Chest soreness  . Asa [Aspirin] Other (See Comments)    GI Bleed  . Doxycycline Diarrhea  . Hctz [Hydrochlorothiazide]     Leg cramps  . Iodine Hives    IVP dye  . Sulfa Antibiotics Other (See Comments)    Unknown     Review of Systems  Constitutional: Positive for activity change, appetite change, fatigue and unexpected weight change (Lost 24 pounds in 3 months). Negative for fever and chills.  HENT: Positive for trouble swallowing and voice change.   Eyes: Negative for visual disturbance.  Respiratory: Positive for cough, shortness of breath, wheezing and stridor.   Cardiovascular: Negative for chest pain.       Orthopnea  Gastrointestinal: Negative  for abdominal pain and blood in stool.  Genitourinary: Negative for hematuria and difficulty urinating.  Musculoskeletal: Positive for arthralgias.       Left arm edema  Neurological: Positive for weakness  (Generalized). Negative for syncope.  Hematological: Negative for adenopathy. Does not bruise/bleed easily.    BP 117/61 mmHg  Pulse 81  Resp 20  Ht '5\' 4"'$  (1.626 m)  Wt 153 lb (69.4 kg)  BMI 26.25 kg/m2  SpO2 93% Physical Exam  Constitutional: He is oriented to person, place, and time. No distress.  65 year old man appears much older than stated age  HENT:  Head: Normocephalic and atraumatic.  Eyes: Conjunctivae and EOM are normal. No scleral icterus.  Neck: No thyromegaly present.  Well-healed scar left neck  Cardiovascular: Normal rate and regular rhythm.   Murmur (2/6 systolic) heard. Pulmonary/Chest:  Diminished breath sounds on right with rhonchi and wheezing  Abdominal: Soft. There is no tenderness.  Musculoskeletal: He exhibits edema (Left upper extremity mid forearm and hand).  Status post right BKA, prostheses in place  Lymphadenopathy:    He has no cervical adenopathy.  Neurological: He is alert and oriented to person, place, and time. No cranial nerve deficit.  Skin: Skin is warm and dry.  Vitals reviewed.    Diagnostic Tests: CT CHEST WITHOUT CONTRAST  TECHNIQUE: Multidetector CT imaging of the chest was performed following the standard protocol without IV contrast.  COMPARISON: Included lung bases from CT abdomen earlier today.  FINDINGS: Mediastinum/Lymph Nodes: Extensive mediastinal adenopathy and masslike opacity in the right hilar and right mediastinum, with marked paratracheal thickening and circumferential soft tissue density posterior lateral to the mid distal trachea, encasing the trachea at the carina, and causing luminal narrowing of the right mainstem bronchus. Exact measurements are difficult due to the large nature, however measures at least 9.5 cm in greatest dimension. Individual lymph nodes versus pulmonary mass cannot be discretely identified given lack contrast in the conglomerate soft tissue nature. Separate enlarged AP window  lymph node measures 1.5 cm short axis with additional superior mediastinal, prevascular, and AP window nodes. There is abnormal left axillary adenopathy. Multiple small right axillary lymph nodes also seen. Mild cardiomegaly. Moderate coronary artery calcifications. Atherosclerosis of the thoracic aorta which is normal in caliber. Physiologic pericardial fluid.  Lungs/Pleura: Abnormal right hilar soft tissue density with spiculation, a discrete right lung nodule or mass is not seen due to the conglomerate nature and lack of intravenous contrast. There luminal narrowing and near complete occlusion of the right mainstem bronchus secondary to bronchial encasement. Mass encases the bronchus intermedius with marked narrowing of the middle and lower lobe bronchi. Volume loss in the right lung with small right pleural effusion. Ill-defined and nodular right lower lobe opaciteis. 2 spiculated nodules in the left upper lobe. More superior nodule measures 2.4 x 1.8 x 2.2 cm with peripheral cavitation. The more inferior nodule measures 2.1 x 1.6 x 2.0 cm. Both nodules have spiculated borders.  Upper abdomen: Evaluated earlier this day at with CT abdomen/pelvis.  Musculoskeletal: No destructive lytic or discrete blastic osseous lesion. 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  IMPRESSION: 1. 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 Large right hilar/mediastinal masslike soft tissue density causing encasement and narrowing of the right mainstem bronchus and bronchus intermedius. Discrete mass difficult separate from the extensive multifocal mediastinal adenopathy. The right lower lobe opacities may be postobstructive atelectasis, spread of malignancy or pneumonia. Suspect bronchogenic malignancy, however primary lesion difficult to discern. 2.  Two spiculated left upper lobe nodules, both approximately 2 cm, primary versus metastatic disease. These  results were called by telephone at the time of interpretation on 09/26/2015 at 10:19 pm to Dr. Milton Ferguson , who verbally acknowledged these results.   Electronically Signed  By: Jeb Levering M.D.  On: 09/26/2015 22:20  I personally reviewed the CT chest confirmed the findings as noted above.  Impression: 65 year old man with a history of heavy tobacco abuse who has a large right hilar mass and massive mediastinal adenopathy. This is most likely small cell lung cancer. Non-small cell is also in the differential, but I think it's less likely. He was supposed to have a PET/CT earlier today but his wife said that he could not tolerate lying flat.  He had bronchoscopy but unfortunately was nondiagnostic. He needs a definitive diagnosis of the treatment can be initiated. I recommended that we do a repeat bronchoscopy, endobronchial ultrasound, and possible mediastinoscopy for diagnostic purposes.  I informed him of the general nature of the procedure and the intraoperative decision making as to whether to proceed with mediastinal endoscopy. We will plan to do the procedures and outpatient but we'll have a low threshold to keep her overnight observation as he appears very ill.  I informed him and his wife of the indications, risks, benefits, and alternatives. They understand this is diagnostic and not therapeutic. They understand that he is high risk due to his general health issues. They understand the risks include, but not limited to death, MI, stroke, DVT, PE, bleeding, possible need for transfusion, infection, pneumothorax, and failure to make a diagnosis.  He accepts the risks and agrees to proceed  Plan: Bronchoscopy, endobronchial ultrasound, possible mediastinoscopy on Monday, 10/13/2015  Melrose Nakayama, MD [facility] [phone]

## 2015-10-13 NOTE — Patient Instructions (Addendum)
[name]   CHEMOTHERAPY INSTRUCTIONS  Premeds:   Day 1: Aloxi - this is for nausea/prevention reduction   Dexamethasone - steroid - this is for nausea/vomiting prevention/reduction Day 2 & 3: Dexamethasone - steroid - this is for nausea/vomiting prevention/reduction   Chemotherapy:     Carboplatin - this medication can be hard on your kidneys - this is why we need you to drinking 64 oz of fluid (preferably water/decaff fluids) 2 days prior to chemo and for up to 4-5 days after chemo. Drink more if you can. This will help to keep your kidneys flushed. This can cause mild hair loss, lower your platelets (which keep you from bleeding out when you cut yourself), lower your white blood cells (fight infection), and cause nausea/vomiting. (only takes 30 minutes to infuse)  (you will receive this drug on Day 1)  VP16 (Etoposide) - this medication can lower your blood pressure. We will monitor this during chemo. Bone marrow suppression (lowers white blood cells (fight infection), lowers red blood cells (make up your blood), lower platelets (help blood to clot). Hair loss, loss of appetite. (takes 1 hour to infuse) (you will receive this drug on Days 1-3)   Neulasta - this medication is not chemo but being given because you have had chemo. It is usually given 24-27 hours after the completion of chemotherapy. This medication works by boosting your bone marrow's supply of white blood cells. White blood cells are what protect our bodies against infection. The medication is given in the form of a subcutaneous injection. It is given in the fatty tissue of your abdomen. It is a short needle. The major side effect of this medication is bone or muscle pain. The drug of choice to relieve or lessen the pain is Aleve or Ibuprofen. If a physician has ever told you not to take Aleve or Ibuprofen - then don't take it. You should then take Tylenol/acetaminophen. Take either medication  as the bottle directs you to.  The level of pain you experience as a result of this injection can range from none, to mild or moderate, or severe. Please let us know if you develop moderate or severe bone pain.      POTENTIAL SIDE EFFECTS OF TREATMENT: Increased Susceptibility to Infection, Vomiting, Constipation, Hair Thinning, Changes in Character of Skin and Nails (brittleness, dryness,etc.), Pigment Changes (darkening of nail beds, palms of hands, soles of feet, etc.), Bone Marrow Suppression, Abdominal Cramping, Complete Hair Loss, Nausea, Diarrhea, Sun Sensitivity and Mouth Sores    EDUCATIONAL MATERIALS GIVEN AND REVIEWED: Chemotherapy and You booklet Specific Instructions Sheets: Carboplatin, Etoposide, Neulasta, Aloxi, Dexamethasone, Zofran tablets, Compazine tablets, EMLA cream   SELF CARE ACTIVITIES WHILE ON CHEMOTHERAPY: Increase your fluid intake 48 hours prior to treatment and drink at least 2 quarts per day after treatment., No alcohol intake., No aspirin or other medications unless approved by your oncologist., Eat foods that are light and easy to digest., Eat foods at cold or room temperature., No fried, fatty, or spicy foods immediately before or after treatment., Have teeth cleaned professionally before starting treatment. Keep dentures and partial plates clean., Use soft toothbrush and do not use mouthwashes that contain alcohol. Biotene is a good mouthwash that is available at most pharmacies or may be ordered by calling [phone]., Use warm salt water gargles (1 teaspoon salt per 1 quart warm water) before and after meals and at bedtime. Or you may rinse with 2 tablespoons of three -  percent hydrogen peroxide mixed in eight ounces of water., Always use sunscreen with SPF (Sun Protection Factor) of 30 or higher., Use your nausea medication as directed to prevent nausea., Use your stool softener or laxative as directed to prevent constipation. and Use your anti-diarrheal  medication as directed to stop diarrhea.  Please wash your hands for at least 30 seconds using warm soapy water. Handwashing is the #1 way to prevent the spread of germs. Stay away from sick people or people who are getting over a cold. If you develop respiratory systems such as green/yellow mucus production or productive cough or persistent cough let us know and we will see if you need an antibiotic. It is a good idea to keep a pair of gloves on when going into grocery stores/[employer] to decrease your risk of coming into contact with germs on the carts, etc. Carry alcohol hand gel with you at all times and use it frequently if out in public. All foods need to be cooked thoroughly. No raw foods. No medium or undercooked meats, eggs. If your food is cooked medium well, it does not need to be hot pink or saturated with bloody liquid at all. Vegetables and fruits need to be washed/rinsed under the faucet with a dish detergent before being consumed. You can eat raw fruits and vegetables unless we tell you otherwise but it would be best if you cooked them or bought frozen. Do not eat off of salad bars or hot bars unless you really trust the cleanliness of the restaurant. If you need dental work, please let Dr. Whitney Muse know before you go for your appointment so that we can coordinate the best possible time for you in regards to your chemo regimen. You need to also let your dentist know that you are actively taking chemo. We may need to do labs prior to your dental appointment. We also want your bowels moving at least every other day. If this is not happening, we need to know so that we can get you on a bowel regimen to help you go. If you are going to have sex you must wear a condom. This is to protect your partner from potential chemotherapy exposure.      MEDICATIONS: You have been given prescriptions for the following medications:  Zofran/Ondansetron '8mg'$  tablet. Take 1 tablet every 8 hours as needed for  nausea/vomiting. (#1 nausea med to take, this can constipate)  Compazine/Prochlorperazine '10mg'$  tablet. Take 1 tablet every 6 hours as needed for nausea/vomiting. (#2 nausea med to take, this can make you sleepy)  EMLA cream. Apply a quarter size amount to port site 1 hour prior to chemo. Do not rub in. Cover with plastic wrap.  Over-the-Counter Meds:  Miralax 1 capful in 8 oz of fluid daily. May increase to two times a day if needed. This is a stool softener. If this doesn't work proceed you can add:  Senokot S  - start with 1 tablet two times a day and increase to 4 tablets two times a day if needed. (total of 8 tablets in a 24 hour period). This is a stimulant laxative.   Call us if this does not help your bowels move.   Imodium '2mg'$  capsule. Take 2 capsules after the 1st loose stool and then 1 capsule every 2 hours until you go a total of 12 hours without having a loose stool. Call the [facility] if loose stools continue. If diarrhea occurs @ bedtime, take 2 capsules @ bedtime.  Then take 2 capsules every 4 hours until morning. Call [facility].   SYMPTOMS TO REPORT AS SOON AS POSSIBLE AFTER TREATMENT:  FEVER GREATER THAN 100.5 F  CHILLS WITH OR WITHOUT FEVER  NAUSEA AND VOMITING THAT IS NOT CONTROLLED WITH YOUR NAUSEA MEDICATION  UNUSUAL SHORTNESS OF BREATH  UNUSUAL BRUISING OR BLEEDING  TENDERNESS IN MOUTH AND THROAT WITH OR WITHOUT PRESENCE OF ULCERS  URINARY PROBLEMS  BOWEL PROBLEMS  UNUSUAL RASH    Wear comfortable clothing and clothing appropriate for easy access to any Portacath or PICC line. Let us know if there is anything that we can do to make your therapy better!      I have been informed and understand all of the instructions given to me and have received a copy. I have been instructed to call the clinic [phone] or my family physician as soon as possible for continued medical care, if indicated. I do not have any more questions at this time but  understand that I may call the [facility] or the Patient Navigator at [phone] during office hours should I have questions or need assistance in obtaining follow-up care.            Carboplatin injection What is this medicine? CARBOPLATIN (KAR boe pla tin) is a chemotherapy drug. It targets fast dividing cells, like cancer cells, and causes these cells to die. This medicine is used to treat ovarian cancer and many other cancers. This medicine may be used for other purposes; ask your health care provider or pharmacist if you have questions. What should I tell my health care provider before I take this medicine? They need to know if you have any of these conditions: -blood disorders -hearing problems -kidney disease -recent or ongoing radiation therapy -an unusual or allergic reaction to carboplatin, cisplatin, other chemotherapy, other medicines, foods, dyes, or preservatives -pregnant or trying to get pregnant -breast-feeding How should I use this medicine? This drug is usually given as an infusion into a vein. It is administered in a hospital or clinic by a specially trained health care professional. Talk to your pediatrician regarding the use of this medicine in children. Special care may be needed. Overdosage: If you think you have taken too much of this medicine contact a poison control center or emergency room at once. NOTE: This medicine is only for you. Do not share this medicine with others. What if I miss a dose? It is important not to miss a dose. Call your doctor or health care professional if you are unable to keep an appointment. What may interact with this medicine? -medicines for seizures -medicines to increase blood counts like filgrastim, pegfilgrastim, sargramostim -some antibiotics like amikacin, gentamicin, neomycin, streptomycin, tobramycin -vaccines Talk to your doctor or health care professional before taking any of these  medicines: -acetaminophen -aspirin -ibuprofen -ketoprofen -naproxen This list may not describe all possible interactions. Give your health care provider a list of all the medicines, herbs, non-prescription drugs, or dietary supplements you use. Also tell them if you smoke, drink alcohol, or use illegal drugs. Some items may interact with your medicine. What should I watch for while using this medicine? Your condition will be monitored carefully while you are receiving this medicine. You will need important blood work done while you are taking this medicine. This drug may make you feel generally unwell. This is not uncommon, as chemotherapy can affect healthy cells as well as cancer cells. Report any side effects. Continue your course  of treatment even though you feel ill unless your doctor tells you to stop. In some cases, you may be given additional medicines to help with side effects. Follow all directions for their use. Call your doctor or health care professional for advice if you get a fever, chills or sore throat, or other symptoms of a cold or flu. Do not treat yourself. This drug decreases your body's ability to fight infections. Try to avoid being around people who are sick. This medicine may increase your risk to bruise or bleed. Call your doctor or health care professional if you notice any unusual bleeding. Be careful brushing and flossing your teeth or using a toothpick because you may get an infection or bleed more easily. If you have any dental work done, tell your dentist you are receiving this medicine. Avoid taking products that contain aspirin, acetaminophen, ibuprofen, naproxen, or ketoprofen unless instructed by your doctor. These medicines may hide a fever. Do not become pregnant while taking this medicine. Women should inform their doctor if they wish to become pregnant or think they might be pregnant. There is a potential for serious side effects to an unborn child. Talk to  your health care professional or pharmacist for more information. Do not breast-feed an infant while taking this medicine. What side effects may I notice from receiving this medicine? Side effects that you should report to your doctor or health care professional as soon as possible: -allergic reactions like skin rash, itching or hives, swelling of the face, lips, or tongue -signs of infection - fever or chills, cough, sore throat, pain or difficulty passing urine -signs of decreased platelets or bleeding - bruising, pinpoint red spots on the skin, black, tarry stools, nosebleeds -signs of decreased red blood cells - unusually weak or tired, fainting spells, lightheadedness -breathing problems -changes in hearing -changes in vision -chest pain -high blood pressure -low blood counts - This drug may decrease the number of white blood cells, red blood cells and platelets. You may be at increased risk for infections and bleeding. -nausea and vomiting -pain, swelling, redness or irritation at the injection site -pain, tingling, numbness in the hands or feet -problems with balance, talking, walking -trouble passing urine or change in the amount of urine Side effects that usually do not require medical attention (report to your doctor or health care professional if they continue or are bothersome): -hair loss -loss of appetite -metallic taste in the mouth or changes in taste This list may not describe all possible side effects. Call your doctor for medical advice about side effects. You may report side effects to FDA at 1-800-FDA-1088. Where should I keep my medicine? This drug is given in a hospital or clinic and will not be stored at home. NOTE: This sheet is a summary. It may not cover all possible information. If you have questions about this medicine, talk to your doctor, pharmacist, or health care provider.    2016, [employer] Standard. (2007-09-12 14:38:05) Etoposide, VP-16  injection What is this medicine? ETOPOSIDE, VP-16 (e toe POE side) is a chemotherapy drug. It is used to treat testicular cancer, lung cancer, and other cancers. This medicine may be used for other purposes; ask your health care provider or pharmacist if you have questions. What should I tell my health care provider before I take this medicine? They need to know if you have any of these conditions: -infection -kidney disease -low blood counts, like low white cell, platelet, or red cell counts -an unusual  or allergic reaction to etoposide, other chemotherapeutic agents, other medicines, foods, dyes, or preservatives -pregnant or trying to get pregnant -breast-feeding How should I use this medicine? This medicine is for infusion into a vein. It is administered in a hospital or clinic by a specially trained health care professional. Talk to your pediatrician regarding the use of this medicine in children. Special care may be needed. Overdosage: If you think you have taken too much of this medicine contact a poison control center or emergency room at once. NOTE: This medicine is only for you. Do not share this medicine with others. What if I miss a dose? It is important not to miss your dose. Call your doctor or health care professional if you are unable to keep an appointment. What may interact with this medicine? -aspirin -certain medications for seizures like carbamazepine, phenobarbital, phenytoin, valproic acid -cyclosporine -levamisole -warfarin This list may not describe all possible interactions. Give your health care provider a list of all the medicines, herbs, non-prescription drugs, or dietary supplements you use. Also tell them if you smoke, drink alcohol, or use illegal drugs. Some items may interact with your medicine. What should I watch for while using this medicine? Visit your doctor for checks on your progress. This drug may make you feel generally unwell. This is not  uncommon, as chemotherapy can affect healthy cells as well as cancer cells. Report any side effects. Continue your course of treatment even though you feel ill unless your doctor tells you to stop. In some cases, you may be given additional medicines to help with side effects. Follow all directions for their use. Call your doctor or health care professional for advice if you get a fever, chills or sore throat, or other symptoms of a cold or flu. Do not treat yourself. This drug decreases your body's ability to fight infections. Try to avoid being around people who are sick. This medicine may increase your risk to bruise or bleed. Call your doctor or health care professional if you notice any unusual bleeding. Be careful brushing and flossing your teeth or using a toothpick because you may get an infection or bleed more easily. If you have any dental work done, tell your dentist you are receiving this medicine. Avoid taking products that contain aspirin, acetaminophen, ibuprofen, naproxen, or ketoprofen unless instructed by your doctor. These medicines may hide a fever. Do not become pregnant while taking this medicine or for at least 6 months after stopping it. Women should inform their doctor if they wish to become pregnant or think they might be pregnant. Women of child-bearing potential will need to have a negative pregnancy test before starting this medicine. There is a potential for serious side effects to an unborn child. Talk to your health care professional or pharmacist for more information. Do not breast-feed an infant while taking this medicine. Men must use a latex condom during sexual contact with a woman while taking this medicine and for at least 4 months after stopping it. A latex condom is needed even if you have had a vasectomy. Contact your doctor right away if your partner becomes pregnant. Do not donate sperm while taking this medicine and for at least 4 months after you stop taking this  medicine. Men should inform their doctors if they wish to father a child. This medicine may lower sperm counts. What side effects may I notice from receiving this medicine? Side effects that you should report to your doctor or health care professional  as soon as possible: -allergic reactions like skin rash, itching or hives, swelling of the face, lips, or tongue -low blood counts - this medicine may decrease the number of white blood cells, red blood cells and platelets. You may be at increased risk for infections and bleeding. -signs of infection - fever or chills, cough, sore throat, pain or difficulty passing urine -signs of decreased platelets or bleeding - bruising, pinpoint red spots on the skin, black, tarry stools, blood in the urine -signs of decreased red blood cells - unusually weak or tired, fainting spells, lightheadedness -breathing problems -changes in vision -mouth or throat sores or ulcers -pain, redness, swelling or irritation at the injection site -pain, tingling, numbness in the hands or feet -redness, blistering, peeling or loosening of the skin, including inside the mouth -seizures -vomiting Side effects that usually do not require medical attention (report to your doctor or health care professional if they continue or are bothersome): -diarrhea -hair loss -loss of appetite -nausea -stomach pain This list may not describe all possible side effects. Call your doctor for medical advice about side effects. You may report side effects to FDA at 1-800-FDA-1088. Where should I keep my medicine? This drug is given in a hospital or clinic and will not be stored at home. NOTE: This sheet is a summary. It may not cover all possible information. If you have questions about this medicine, talk to your doctor, pharmacist, or health care provider.    2016, [employer] Standard. (2014-01-31 12:32:50) Palonosetron Injection What is this medicine? PALONOSETRON (pal oh NOE se  tron) is used to prevent nausea and vomiting caused by chemotherapy. It also helps prevent delayed nausea and vomiting that may occur a few days after your treatment. This medicine may be used for other purposes; ask your health care provider or pharmacist if you have questions. What should I tell my health care provider before I take this medicine? They need to know if you have any of these conditions: -an unusual or allergic reaction to palonosetron, dolasetron, granisetron, ondansetron, other medicines, foods, dyes, or preservatives -pregnant or trying to get pregnant -breast-feeding How should I use this medicine? This medicine is for infusion into a vein. It is given by a health care professional in a hospital or clinic setting. Talk to your pediatrician regarding the use of this medicine in children. While this drug may be prescribed for children as young as 1 month for selected conditions, precautions do apply. Overdosage: If you think you have taken too much of this medicine contact a poison control center or emergency room at once. NOTE: This medicine is only for you. Do not share this medicine with others. What if I miss a dose? This does not apply. What may interact with this medicine? -certain medicines for depression, anxiety, or psychotic disturbances -fentanyl -linezolid -MAOIs like Carbex, Eldepryl, Marplan, Nardil, and Parnate -methylene blue (injected into a vein) -tramadol This list may not describe all possible interactions. Give your health care provider a list of all the medicines, herbs, non-prescription drugs, or dietary supplements you use. Also tell them if you smoke, drink alcohol, or use illegal drugs. Some items may interact with your medicine. What should I watch for while using this medicine? Your condition will be monitored carefully while you are receiving this medicine. What side effects may I notice from receiving this medicine? Side effects that you should  report to your doctor or health care professional as soon as possible: -allergic reactions like skin rash,  itching or hives, swelling of the face, lips, or tongue -breathing problems -confusion -dizziness -fast, irregular heartbeat -fever and chills -loss of balance or coordination -seizures -sweating -swelling of the hands and feet -tremors -unusually weak or tired Side effects that usually do not require medical attention (report to your doctor or health care professional if they continue or are bothersome): -constipation or diarrhea -headache This list may not describe all possible side effects. Call your doctor for medical advice about side effects. You may report side effects to FDA at 1-800-FDA-1088. Where should I keep my medicine? This drug is given in a hospital or clinic and will not be stored at home. NOTE: This sheet is a summary. It may not cover all possible information. If you have questions about this medicine, talk to your doctor, pharmacist, or health care provider.    2016, [employer] Standard. (2013-04-13 10:38:36) Dexamethasone injection What is this medicine? DEXAMETHASONE (dex a METH a sone) is a corticosteroid. It is used to treat inflammation of the skin, joints, lungs, and other organs. Common conditions treated include asthma, allergies, and arthritis. It is also used for other conditions, like blood disorders and diseases of the adrenal glands. This medicine may be used for other purposes; ask your health care provider or pharmacist if you have questions. What should I tell my health care provider before I take this medicine? They need to know if you have any of these conditions: -blood clotting problems -Cushing's syndrome -diabetes -glaucoma -heart problems or disease -high blood pressure -infection like herpes, measles, tuberculosis, or chickenpox -kidney disease -liver disease -mental problems -myasthenia gravis -osteoporosis -previous heart  attack -seizures -stomach, ulcer or intestine disease including colitis and diverticulitis -thyroid problem -an unusual or allergic reaction to dexamethasone, corticosteroids, other medicines, lactose, foods, dyes, or preservatives -pregnant or trying to get pregnant -breast-feeding How should I use this medicine? This medicine is for injection into a muscle, joint, lesion, soft tissue, or vein. It is given by a health care professional in a hospital or clinic setting. Talk to your pediatrician regarding the use of this medicine in children. Special care may be needed. Overdosage: If you think you have taken too much of this medicine contact a poison control center or emergency room at once. NOTE: This medicine is only for you. Do not share this medicine with others. What if I miss a dose? This may not apply. If you are having a series of injections over a prolonged period, try not to miss an appointment. Call your doctor or health care professional to reschedule if you are unable to keep an appointment. What may interact with this medicine? Do not take this medicine with any of the following medications: -mifepristone, RU-486 -vaccines This medicine may also interact with the following medications: -amphotericin B -antibiotics like clarithromycin, erythromycin, and troleandomycin -aspirin and aspirin-like drugs -barbiturates like phenobarbital -carbamazepine -cholestyramine -cholinesterase inhibitors like donepezil, galantamine, rivastigmine, and tacrine -cyclosporine -digoxin -diuretics -ephedrine -male hormones, like estrogens or progestins and birth control pills -indinavir -isoniazid -ketoconazole -medicines for diabetes -medicines that improve muscle tone or strength for conditions like myasthenia gravis -NSAIDs, medicines for pain and inflammation, like ibuprofen or naproxen -phenytoin -rifampin -thalidomide -warfarin This list may not describe all possible  interactions. Give your health care provider a list of all the medicines, herbs, non-prescription drugs, or dietary supplements you use. Also tell them if you smoke, drink alcohol, or use illegal drugs. Some items may interact with your medicine. What should I watch for while  using this medicine? Your condition will be monitored carefully while you are receiving this medicine. If you are taking this medicine for a long time, carry an identification card with your name and address, the type and dose of your medicine, and your doctor's name and address. This medicine may increase your risk of getting an infection. Stay away from people who are sick. Tell your doctor or health care professional if you are around anyone with measles or chickenpox. Talk to your health care provider before you get any vaccines that you take this medicine. If you are going to have surgery, tell your doctor or health care professional that you have taken this medicine within the last twelve months. Ask your doctor or health care professional about your diet. You may need to lower the amount of salt you eat. The medicine can increase your blood sugar. If you are a diabetic check with your doctor if you need help adjusting the dose of your diabetic medicine. What side effects may I notice from receiving this medicine? Side effects that you should report to your doctor or health care professional as soon as possible: -allergic reactions like skin rash, itching or hives, swelling of the face, lips, or tongue -black or tarry stools -change in the amount of urine -changes in vision -confusion, excitement, restlessness, a false sense of well-being -fever, sore throat, sneezing, cough, or other signs of infection, wounds that will not heal -hallucinations -increased thirst -mental depression, mood swings, mistaken feelings of self importance or of being mistreated -pain in hips, back, ribs, arms, shoulders, or legs -pain,  redness, or irritation at the injection site -redness, blistering, peeling or loosening of the skin, including inside the mouth -rounding out of face -swelling of feet or lower legs -unusual bleeding or bruising -unusual tired or weak -wounds that do not heal Side effects that usually do not require medical attention (report to your doctor or health care professional if they continue or are bothersome): -diarrhea or constipation -change in taste -headache -nausea, vomiting -skin problems, acne, thin and shiny skin -touble sleeping -unusual growth of hair on the face or body -weight gain This list may not describe all possible side effects. Call your doctor for medical advice about side effects. You may report side effects to FDA at 1-800-FDA-1088. Where should I keep my medicine? This drug is given in a hospital or clinic and will not be stored at home. NOTE: This sheet is a summary. It may not cover all possible information. If you have questions about this medicine, talk to your doctor, pharmacist, or health care provider.    2016, [employer]. (2007-09-28 14:04:12) Pegfilgrastim injection What is this medicine? PEGFILGRASTIM (PEG fil gra stim) is a long-acting granulocyte colony-stimulating factor that stimulates the growth of neutrophils, a type of white blood cell important in the body's fight against infection. It is used to reduce the incidence of fever and infection in patients with certain types of cancer who are receiving chemotherapy that affects the bone marrow, and to increase survival after being exposed to high doses of radiation. This medicine may be used for other purposes; ask your health care provider or pharmacist if you have questions. What should I tell my health care provider before I take this medicine? They need to know if you have any of these conditions: -kidney disease -latex allergy -ongoing radiation therapy -sickle cell disease -skin  reactions to acrylic adhesives (On-Body Injector only) -an unusual or allergic reaction to pegfilgrastim, filgrastim, other  medicines, foods, dyes, or preservatives -pregnant or trying to get pregnant -breast-feeding How should I use this medicine? This medicine is for injection under the skin. If you get this medicine at home, you will be taught how to prepare and give the pre-filled syringe or how to use the On-body Injector. Refer to the patient Instructions for Use for detailed instructions. Use exactly as directed. Take your medicine at regular intervals. Do not take your medicine more often than directed. It is important that you put your used needles and syringes in a special sharps container. Do not put them in a trash can. If you do not have a sharps container, call your pharmacist or healthcare provider to get one. Talk to your pediatrician regarding the use of this medicine in children. While this drug may be prescribed for selected conditions, precautions do apply. Overdosage: If you think you have taken too much of this medicine contact a poison control center or emergency room at once. NOTE: This medicine is only for you. Do not share this medicine with others. What if I miss a dose? It is important not to miss your dose. Call your doctor or health care professional if you miss your dose. If you miss a dose due to an On-body Injector failure or leakage, a new dose should be administered as soon as possible using a single prefilled syringe for manual use. What may interact with this medicine? Interactions have not been studied. Give your health care provider a list of all the medicines, herbs, non-prescription drugs, or dietary supplements you use. Also tell them if you smoke, drink alcohol, or use illegal drugs. Some items may interact with your medicine. This list may not describe all possible interactions. Give your health care provider a list of all the medicines, herbs,  non-prescription drugs, or dietary supplements you use. Also tell them if you smoke, drink alcohol, or use illegal drugs. Some items may interact with your medicine. What should I watch for while using this medicine? You may need blood work done while you are taking this medicine. If you are going to need a MRI, CT scan, or other procedure, tell your doctor that you are using this medicine (On-Body Injector only). What side effects may I notice from receiving this medicine? Side effects that you should report to your doctor or health care professional as soon as possible: -allergic reactions like skin rash, itching or hives, swelling of the face, lips, or tongue -dizziness -fever -pain, redness, or irritation at site where injected -pinpoint red spots on the skin -red or dark-brown urine -shortness of breath or breathing problems -stomach or side pain, or pain at the shoulder -swelling -tiredness -trouble passing urine or change in the amount of urine Side effects that usually do not require medical attention (report to your doctor or health care professional if they continue or are bothersome): -bone pain -muscle pain This list may not describe all possible side effects. Call your doctor for medical advice about side effects. You may report side effects to FDA at 1-800-FDA-1088. Where should I keep my medicine? Keep out of the reach of children. Store pre-filled syringes in a refrigerator between 2 and 8 degrees C (36 and 46 degrees F). Do not freeze. Keep in carton to protect from light. Throw away this medicine if it is left out of the refrigerator for more than 48 hours. Throw away any unused medicine after the expiration date. NOTE: This sheet is a summary. It may not cover  all possible information. If you have questions about this medicine, talk to your doctor, pharmacist, or health care provider.    2016, [employer] Standard. (2014-06-27 14:30:14) Lidocaine; Prilocaine  cream What is this medicine? LIDOCAINE; PRILOCAINE (LYE doe kane; PRIL oh kane) is a topical anesthetic that causes loss of feeling in the skin and surrounding tissues. It is used to numb the skin before procedures or injections. This medicine may be used for other purposes; ask your health care provider or pharmacist if you have questions. What should I tell my health care provider before I take this medicine? They need to know if you have any of these conditions: -glucose-6-phosphate deficiencies -heart disease -kidney or liver disease -methemoglobinemia -an unusual or allergic reaction to lidocaine, prilocaine, other medicines, foods, dyes, or preservatives -pregnant or trying to get pregnant -breast-feeding How should I use this medicine? This medicine is for external use only on the skin. Do not take by mouth. Follow the directions on the prescription label. Wash hands before and after use. Do not use more or leave in contact with the skin longer than directed. Do not apply to eyes or open wounds. It can cause irritation and blurred or temporary loss of vision. If this medicine comes in contact with your eyes, immediately rinse the eye with water. Do not touch or rub the eye. Contact your health care provider right away. Talk to your pediatrician regarding the use of this medicine in children. While this medicine may be prescribed for children for selected conditions, precautions do apply. Overdosage: If you think you have taken too much of this medicine contact a poison control center or emergency room at once. NOTE: This medicine is only for you. Do not share this medicine with others. What if I miss a dose? This medicine is usually only applied once prior to each procedure. It must be in contact with the skin for a period of time for it to work. If you applied this medicine later than directed, tell your health care professional before starting the procedure. What may interact with this  medicine? -acetaminophen -chloroquine -dapsone -medicines to control heart rhythm -nitrates like nitroglycerin and nitroprusside -other ointments, creams, or sprays that may contain anesthetic medicine -phenobarbital -phenytoin -quinine -sulfonamides like sulfacetamide, sulfamethoxazole, sulfasalazine and others This list may not describe all possible interactions. Give your health care provider a list of all the medicines, herbs, non-prescription drugs, or dietary supplements you use. Also tell them if you smoke, drink alcohol, or use illegal drugs. Some items may interact with your medicine. What should I watch for while using this medicine? Be careful to avoid injury to the treated area while it is numb and you are not aware of pain. Avoid scratching, rubbing, or exposing the treated area to hot or cold temperatures until complete sensation has returned. The numb feeling will wear off a few hours after applying the cream. What side effects may I notice from receiving this medicine? Side effects that you should report to your doctor or health care professional as soon as possible: -blurred vision -chest pain -difficulty breathing -dizziness -drowsiness -fast or irregular heartbeat -skin rash or itching -swelling of your throat, lips, or face -trembling Side effects that usually do not require medical attention (report to your doctor or health care professional if they continue or are bothersome): -changes in ability to feel hot or cold -redness and swelling at the application site This list may not describe all possible side effects. Call your doctor for medical  advice about side effects. You may report side effects to FDA at 1-800-FDA-1088. Where should I keep my medicine? Keep out of reach of children. Store at room temperature between 15 and 30 degrees C (59 and 86 degrees F). Keep container tightly closed. Throw away any unused medicine after the expiration date. NOTE: This  sheet is a summary. It may not cover all possible information. If you have questions about this medicine, talk to your doctor, pharmacist, or health care provider.    2016, [employer]. (2007-12-11 17:14:35) Ondansetron tablets What is this medicine? ONDANSETRON (on DAN se tron) is used to treat nausea and vomiting caused by chemotherapy. It is also used to prevent or treat nausea and vomiting after surgery. This medicine may be used for other purposes; ask your health care provider or pharmacist if you have questions. What should I tell my health care provider before I take this medicine? They need to know if you have any of these conditions: -heart disease -history of irregular heartbeat -liver disease -low levels of magnesium or potassium in the blood -an unusual or allergic reaction to ondansetron, granisetron, other medicines, foods, dyes, or preservatives -pregnant or trying to get pregnant -breast-feeding How should I use this medicine? Take this medicine by mouth with a glass of water. Follow the directions on your prescription label. Take your doses at regular intervals. Do not take your medicine more often than directed. Talk to your pediatrician regarding the use of this medicine in children. Special care may be needed. Overdosage: If you think you have taken too much of this medicine contact a poison control center or emergency room at once. NOTE: This medicine is only for you. Do not share this medicine with others. What if I miss a dose? If you miss a dose, take it as soon as you can. If it is almost time for your next dose, take only that dose. Do not take double or extra doses. What may interact with this medicine? Do not take this medicine with any of the following medications: -apomorphine -certain medicines for fungal infections like fluconazole, itraconazole, ketoconazole, posaconazole,  voriconazole -cisapride -dofetilide -dronedarone -pimozide -thioridazine -ziprasidone This medicine may also interact with the following medications: -carbamazepine -certain medicines for depression, anxiety, or psychotic disturbances -fentanyl -linezolid -MAOIs like Carbex, Eldepryl, Marplan, Nardil, and Parnate -methylene blue (injected into a vein) -other medicines that prolong the QT interval (cause an abnormal heart rhythm) -phenytoin -rifampicin -tramadol This list may not describe all possible interactions. Give your health care provider a list of all the medicines, herbs, non-prescription drugs, or dietary supplements you use. Also tell them if you smoke, drink alcohol, or use illegal drugs. Some items may interact with your medicine. What should I watch for while using this medicine? Check with your doctor or health care professional right away if you have any sign of an allergic reaction. What side effects may I notice from receiving this medicine? Side effects that you should report to your doctor or health care professional as soon as possible: -allergic reactions like skin rash, itching or hives, swelling of the face, lips or tongue -breathing problems -confusion -dizziness -fast or irregular heartbeat -feeling faint or lightheaded, falls -fever and chills -loss of balance or coordination -seizures -sweating -swelling of the hands or feet -tightness in the chest -tremors -unusually weak or tired Side effects that usually do not require medical attention (report to your doctor or health care professional if they continue or are bothersome): -constipation or diarrhea -  headache This list may not describe all possible side effects. Call your doctor for medical advice about side effects. You may report side effects to FDA at 1-800-FDA-1088. Where should I keep my medicine? Keep out of the reach of children. Store between 2 and 30 degrees C (36 and 86 degrees F).  Throw away any unused medicine after the expiration date. NOTE: This sheet is a summary. It may not cover all possible information. If you have questions about this medicine, talk to your doctor, pharmacist, or health care provider.    2016, [employer] Standard. (2013-03-14 16:27:45) Prochlorperazine tablets What is this medicine? PROCHLORPERAZINE (proe klor PER a zeen) helps to control severe nausea and vomiting. This medicine is also used to treat schizophrenia. It can also help patients who experience anxiety that is not due to psychological illness. This medicine may be used for other purposes; ask your health care provider or pharmacist if you have questions. What should I tell my health care provider before I take this medicine? They need to know if you have any of these conditions: -blood disorders or disease -dementia -liver disease or jaundice -Parkinson's disease -uncontrollable movement disorder -an unusual or allergic reaction to prochlorperazine, other medicines, foods, dyes, or preservatives -pregnant or trying to get pregnant -breast-feeding How should I use this medicine? Take this medicine by mouth with a glass of water. Follow the directions on the prescription label. Take your doses at regular intervals. Do not take your medicine more often than directed. Do not stop taking this medicine suddenly. This can cause nausea, vomiting, and dizziness. Ask your doctor or health care professional for advice. Talk to your pediatrician regarding the use of this medicine in children. Special care may be needed. While this drug may be prescribed for children as young as 2 years for selected conditions, precautions do apply. Overdosage: If you think you have taken too much of this medicine contact a poison control center or emergency room at once. NOTE: This medicine is only for you. Do not share this medicine with others. What if I miss a dose? If you miss a dose, take it as soon as  you can. If it is almost time for your next dose, take only that dose. Do not take double or extra doses. What may interact with this medicine? Do not take this medicine with any of the following medications: -amoxapine -antidepressants like citalopram, escitalopram, fluoxetine, paroxetine, and sertraline -deferoxamine -dofetilide -maprotiline -tricyclic antidepressants like amitriptyline, clomipramine, imipramine, nortiptyline and others This medicine may also interact with the following medications: -lithium -medicines for pain -phenytoin -propranolol -warfarin This list may not describe all possible interactions. Give your health care provider a list of all the medicines, herbs, non-prescription drugs, or dietary supplements you use. Also tell them if you smoke, drink alcohol, or use illegal drugs. Some items may interact with your medicine. What should I watch for while using this medicine? Visit your doctor or health care professional for regular checks on your progress. You may get drowsy or dizzy. Do not drive, use machinery, or do anything that needs mental alertness until you know how this medicine affects you. Do not stand or sit up quickly, especially if you are an older patient. This reduces the risk of dizzy or fainting spells. Alcohol may interfere with the effect of this medicine. Avoid alcoholic drinks. This medicine can reduce the response of your body to heat or cold. Dress warm in cold weather and stay hydrated in hot weather. If possible,  avoid extreme temperatures like saunas, hot tubs, very hot or cold showers, or activities that can cause dehydration such as vigorous exercise. This medicine can make you more sensitive to the sun. Keep out of the sun. If you cannot avoid being in the sun, wear protective clothing and use sunscreen. Do not use sun lamps or tanning beds/booths. Your mouth may get dry. Chewing sugarless gum or sucking hard candy, and drinking plenty of water  may help. Contact your doctor if the problem does not go away or is severe. What side effects may I notice from receiving this medicine? Side effects that you should report to your doctor or health care professional as soon as possible: -blurred vision -breast enlargement in men or women -breast milk in women who are not breast-feeding -chest pain, fast or irregular heartbeat -confusion, restlessness -dark yellow or brown urine -difficulty breathing or swallowing -dizziness or fainting spells -drooling, shaking, movement difficulty (shuffling walk) or rigidity -fever, chills, sore throat -involuntary or uncontrollable movements of the eyes, mouth, head, arms, and legs -seizures -stomach area pain -unusually weak or tired -unusual bleeding or bruising -yellowing of skin or eyes Side effects that usually do not require medical attention (report to your doctor or health care professional if they continue or are bothersome): -difficulty passing urine -difficulty sleeping -headache -sexual dysfunction -skin rash, or itching This list may not describe all possible side effects. Call your doctor for medical advice about side effects. You may report side effects to FDA at 1-800-FDA-1088. Where should I keep my medicine? Keep out of the reach of children. Store at room temperature between 15 and 30 degrees C (59 and 86 degrees F). Protect from light. Throw away any unused medicine after the expiration date. NOTE: This sheet is a summary. It may not cover all possible information. If you have questions about this medicine, talk to your doctor, pharmacist, or health care provider.    2016, [employer]. (2011-10-26 16:59:39)

## 2015-10-14 ENCOUNTER — Encounter (HOSPITAL_COMMUNITY): Payer: Self-pay | Admitting: Thoracic Surgery (Cardiothoracic Vascular Surgery)

## 2015-10-14 DIAGNOSIS — E1122 Type 2 diabetes mellitus with diabetic chronic kidney disease: Secondary | ICD-10-CM | POA: Diagnosis not present

## 2015-10-14 DIAGNOSIS — E1140 Type 2 diabetes mellitus with diabetic neuropathy, unspecified: Secondary | ICD-10-CM | POA: Diagnosis not present

## 2015-10-14 DIAGNOSIS — I509 Heart failure, unspecified: Secondary | ICD-10-CM | POA: Diagnosis not present

## 2015-10-14 DIAGNOSIS — C7971 Secondary malignant neoplasm of right adrenal gland: Secondary | ICD-10-CM | POA: Diagnosis not present

## 2015-10-14 DIAGNOSIS — E785 Hyperlipidemia, unspecified: Secondary | ICD-10-CM | POA: Diagnosis not present

## 2015-10-14 DIAGNOSIS — N185 Chronic kidney disease, stage 5: Secondary | ICD-10-CM | POA: Diagnosis not present

## 2015-10-14 DIAGNOSIS — C3491 Malignant neoplasm of unspecified part of right bronchus or lung: Secondary | ICD-10-CM | POA: Diagnosis not present

## 2015-10-14 DIAGNOSIS — I132 Hypertensive heart and chronic kidney disease with heart failure and with stage 5 chronic kidney disease, or end stage renal disease: Secondary | ICD-10-CM | POA: Diagnosis not present

## 2015-10-14 MED ORDER — PREDNISONE 20 MG PO TABS: ORAL_TABLET | ORAL | Status: DC

## 2015-10-14 MED ORDER — ONDANSETRON HCL 4 MG PO TABS
4.0000 mg | ORAL_TABLET | Freq: Three times a day (TID) | ORAL | Status: AC
Start: 2015-10-14 — End: ?

## 2015-10-14 MED ORDER — ATENOLOL 100 MG PO TABS: ORAL_TABLET | ORAL | Status: AC

## 2015-10-14 MED ORDER — GABAPENTIN 300 MG PO CAPS: 300.0000 mg | ORAL_CAPSULE | Freq: Every day | ORAL | Status: AC | PRN

## 2015-10-14 NOTE — Progress Notes (Signed)
Patient's IV fell out this morning.  Pt refusing to get another IV at this time.  RN will continue to monitor pt.  Claudette Stapler, RN

## 2015-10-14 NOTE — Op Note (Signed)
[name], [name] NO.:  [id number]  MEDICAL RECORD NO.:  [id number]  LOCATION:  2W19C                        FACILITY:  [facility]  PHYSICIAN:  Revonda Standard. Roxan Hockey, M.D.DATE OF BIRTH:  [birth year]  DATE OF PROCEDURE:  10/13/2015 DATE OF DISCHARGE:                              OPERATIVE REPORT   PREOPERATIVE DIAGNOSIS:  Right hilar mass with mediastinal adenopathy.  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  PROCEDURE:  Bronchoscopy. Endobronchial ultrasound. Mediastinoscopy.  SURGEON:  Revonda Standard. Roxan Hockey, M.D.  ASSISTANT:  None.  ANESTHESIA:  General.  FINDINGS:  Bronchoscopy revealed extrinsic compression with near occlusion of the right mainstem bronchus.  Thick secretions distal to the obstruction.  No endobronchial lesion seen.  Endobronchial ultrasound revealed massive mediastinal adenopathy, but needle aspirations were not definitive.  Mediastinoscopy- frozen section of 2R node showed crush artifact likely small cell carcinoma.  CLINICAL NOTE:  [name] is a 65 year old man with a history of tobacco abuse who presented with difficulty swallowing and shortness of breath.  He also had massive weight loss.  He was found to have a right hilar mass and mediastinal adenopathy.  Bronchoscopy was nondiagnostic.  The patient was advised to undergo bronchoscopy with endobronchial ultrasound and possible mediastinoscopy.  The indications, risks, benefits, alternatives, and intraoperative decision making were discussed in detail with the patient.  He understood and accepted the risks and agreed to proceed.  OPERATIVE NOTE:  [name] was brought to the operating room on October 13, 2015.  He had induction of general anesthesia and was intubated. Flexible fiberoptic bronchoscopy was performed via the endotracheal tube.  There was a large mass with extrinsic compression of the membranous  trachea.  All of the mass was extrabronchial.  There was no endobronchial lesion seen.  There was near-complete occlusion of the right mainstem bronchus, but the bronchoscope did pass this area.  There were extensive thick tenacious secretions distal to the area of obstruction.  These were cleared with saline.  The left main stem bronchus was narrow, but not compressed to the degree of the right main stem.  There likewise were thick tenacious secretions on the left side as well.  These were also cleared with saline.  The bronchoscope was removed.  The endobronchial ultrasound probe then was advanced. Circumferentially around the trachea, there was massive adenopathy. Needle aspirations were performed of 4R and 7 lymph nodes.  With each aspiration, the needle was advanced into the node, suction was applied and 10-12 passes were made with ultrasound visualization.  The specimens then were placed onto slides as well as into cytologic fluid.  The slides were sent for quick prep.  They returned primarily nondiagnostic.  On one of the samples, there was a small area with crush artifact suspicious, but not diagnostic of small cell carcinoma.  Decision was made to proceed with mediastinoscopy due to the pressing need for definitive diagnosis and initiation of treatment.  The neck and chest were prepped and draped in the usual sterile fashion.  A transverse incision was made 1 fingerbreadth above the sternal notch.  It was carried through the skin and subcutaneous tissue.  The strap muscles were  separated.  The pretracheal fascia was identified and incised.  A finger was inserted into the mediastinum.  Near the innominate artery, there were dense adhesions that did not allow free passage into the mediastinum beyond that point. There was a palpable node on the right side in the 2R location.  The mediastinoscope was inserted and the 2R node was identified.  Multiple biopsies were obtained.  The  center of this node was necrotic, so as much as possible biopsies were obtained from the subcapsular portion of the node.  Part of the specimen was sent for frozen section.  The remainder was sent for permanent pathology.  Surgicel was applied to the area, and the wound was packed with gauze.  The frozen section returned showing a crush artifact likely small cell carcinoma.  After 5 minutes, the gauze packing was removed.  The mediastinoscope was reinserted.  There was no ongoing bleeding.  The mediastinoscope was withdrawn.  The incision was closed in 2 layers.  Dermabond was applied.  The patient was extubated in the operating room and taken to the postanesthetic care unit in good condition.     Revonda Standard Roxan Hockey, M.D.     SCH/MEDQ  D:  10/14/2015  T:  10/14/2015  Job:  [id number]

## 2015-10-14 NOTE — Progress Notes (Signed)
      [address]       [place] [address]             [phone]       1 Day Post-Op Procedure(s) (LRB): VIDEO BRONCHOSCOPY WITH ENDOBRONCHIAL ULTRASOUND (N/A) MEDIASTINOSCOPY (N/A)  Subjective: Patient without complaints. He is ready to go home.  Objective: Vital signs in last 24 hours: Temp:  [97.7 F (36.5 C)-98.4 F (36.9 C)] 98.4 F (36.9 C) (04/25 0536) Pulse Rate:  [80-95] 81 (04/25 0536) Cardiac Rhythm:  [-] Normal sinus rhythm (04/25 0712) Resp:  [11-27] 20 (04/25 0536) BP: (130-172)/(53-70) 151/63 mmHg (04/25 0600) SpO2:  [88 %-98 %] 96 % (04/25 0536) Weight:  [146 lb (66.225 kg)] 146 lb (66.225 kg) (04/24 1137)     Intake/Output from previous day: 04/24 0701 - 04/25 0700 In: 980 [P.O.:480; I.V.:500] Out: 350 [Urine:300; Blood:50]   Physical Exam:  Cardiovascular: RRR Pulmonary: Coarse breath sounds bilaterally   Lab Results: CBC: Recent Labs  10/13/15 1116  HGB 12.2*  HCT 36.0*   BMET:  Recent Labs  10/13/15 1116  NA 137  K 4.9  CL 97*  GLUCOSE 165*  BUN 108*  CREATININE 4.00*    PT/INR: No results for input(s): LABPROT, INR in the last 72 hours. ABG:  INR: Will add last result for INR, ABG once components are confirmed Will add last 4 CBG results once components are confirmed  Assessment/Plan:  1. CV - SR in the 80's. Hypertensive.Continue pre op medications. 2.  Pulmonary - On 3 liters of oxygen via . Was on oxygen prior to admission 3. Discharge.   ZIMMERMAN,DONIELLE MPA-C 10/14/2015,8:40 AM

## 2015-10-15 ENCOUNTER — Encounter (HOSPITAL_COMMUNITY): Payer: Medicaid Other

## 2015-10-15 ENCOUNTER — Telehealth (HOSPITAL_COMMUNITY): Payer: Self-pay | Admitting: *Deleted

## 2015-10-15 ENCOUNTER — Ambulatory Visit (HOSPITAL_COMMUNITY): Payer: Medicare Other | Admitting: Oncology

## 2015-10-15 ENCOUNTER — Inpatient Hospital Stay (HOSPITAL_COMMUNITY): Payer: Medicare Other

## 2015-10-15 DIAGNOSIS — C3491 Malignant neoplasm of unspecified part of right bronchus or lung: Secondary | ICD-10-CM

## 2015-10-15 NOTE — Progress Notes (Signed)
-   Patient canceled appointment secondary to fatigue. Nursing has contacted the patient at our request urging him to come in today for treatment. Patient refuses-  ROS

## 2015-10-15 NOTE — Discharge Summary (Signed)
Physician Discharge Summary       [address]       [place] [address]             [phone]    Patient ID: [name] MRN: [id number] DOB/AGE: April 12, 1951 65 y.o.  Admit date: 10/13/2015 Discharge date: 10/14/2015  Admission Diagnoses: Lung mass with mediastinal and hilar adenopathy  Primary Discharge Diagnosis: Small cell lung cancer  Additional Discharge Diagnoses:  1. Peripheral vascular disease, unspecified ([place]) 2. Tobacco use disorder 3. HLD (hyperlipidemia) 4. HTN (hypertension) 5. Type II or unspecified type diabetes mellitus without mention of complication, not stated as uncontrolled 6. Carotid artery occlusion 7. AVM (arteriovenous malformation) of colon with hemorrhage 8. GERD (gastroesophageal reflux disease) 9. Chronic kidney disease-stage 5, glomerular filtration rate less than or equal to 15 mL/min/1.73 square meter (HCC) 10. CHF (congestive heart failure) ([place]) 11. Iron deficiency anemia due to chronic blood loss 12. Anxiety 13. Umbilical hernia  Procedure (s):  Bronchoscopy, endobronchial ultrasound, and mediastinoscopy by Dr. Roxan Hockey on 10/13/2015.  History of Presenting Illness: [name] is a 65 year old man with an extensive past medical history, including tobacco abuse (100 pack years), hypertension, hyperlipidemia, type 2 diabetes complicated by neuropathy, vasculopathy and nephropathy, peripheral vascular disease, history of right BKA, left carotid endarterectomy, chronic anemia, coronary artery disease, stage V chronic kidney disease, congestive heart failure.  He has been having difficulty swallowing since about the first of the year. He complains of shortness of breath, wheezing, and general malaise. He has lost 24 pounds in the past 3 months. He also has new swelling in his left arm. He denies any chest pain, pressure, or tightness. He had a CT of the abdomen as part of the workup for his swallowing issues  and abdominal pain. There was a mass noted in the chest. A CT chest showed a large right hilar mass and massive mediastinal adenopathy.  He had bronchoscopy but unfortunately was nondiagnostic. He needs a definitive diagnosis of the treatment can be initiated. Dr. Roxan Hockey recommended that we do a repeat bronchoscopy, endobronchial ultrasound, and possible mediastinoscopy for diagnostic purposes.  Dr. Roxan Hockey informed him of the general nature of the procedure and the intraoperative decision making as to whether to proceed with mediastinal endoscopy. We will plan to do the procedures and outpatient but we'll have a low threshold to keep her overnight observation as he appears very ill. He presented to [facility] on 10/13/2015 in order to undergo a bronchoscopy, endobronchial ultrasound, and mediastinoscopy  Brief Hospital Course:  He remained afebrile and hemodynamically stable. He was on 3 liters of oxygen via [place] and he was on oxygen prior to surgery. He was tolerating a diet. He was felt surgically stable for discharge on 10/14/2015.   Latest Vital Signs: Blood pressure 151/63, pulse 81, temperature 98.4 F (36.9 C), temperature source Oral, resp. rate 20, height '5\' 4"'$  (1.626 m), weight 146 lb (66.225 kg), SpO2 96 %.  Physical Exam: Cardiovascular: RRR Pulmonary: Coarse breath sounds bilaterally  Discharge Condition:Surgically stable and discharged home.  Recent laboratory studies:  Lab Results  Component Value Date   WBC 9.1 10/10/2015   HGB 12.2* 10/13/2015   HCT 36.0* 10/13/2015   MCV 91.1 10/10/2015   PLT 252 10/10/2015   Lab Results  Component Value Date   NA 137 10/13/2015   K 4.9 10/13/2015   CL 97* 10/13/2015   CO2 26 10/10/2015   CREATININE 4.00* 10/13/2015   GLUCOSE 165* 10/13/2015  Diagnostic Studies: Ct Abdomen Pelvis Wo Contrast  09/26/2015  CLINICAL DATA:  Abdominal pain with decreased appetite following esophageal dilatation procedure EXAM: CT ABDOMEN  AND PELVIS WITHOUT CONTRAST TECHNIQUE: Multidetector CT imaging of the abdomen and pelvis was performed following the standard protocol without IV contrast. COMPARISON:  None. FINDINGS: Lung bases demonstrate evidence of right lower lobe pneumonia. Some fullness is also noted anterior to the right main pulmonary artery which likely represents lymphadenopathy. Dedicated CT of the chest is recommended. The liver, gallbladder, spleen, adrenal glands and pancreas are all normal in their CT appearance. The kidneys are well visualized bilaterally and demonstrate renal vascular calcifications. No renal stones or obstructive changes are seen. The bladder is partially distended. Significant calcifications of the seminal vesicles are noted bilaterally. The vas deferens are also calcified. Aortoiliac calcifications are seen. A small umbilical hernia is noted containing fat. The appendix is within normal limits. No significant diverticular change is noted. Stomach demonstrates clips consistent with the prior endoscopy procedure. IMPRESSION: Right lower lobe pneumonia as well as changes consistent with lymphadenopathy in the region of the right hilum and adjacent to the right pulmonary artery. CT of the chest is recommended for further evaluation. Chronic changes in the abdomen and pelvis. No acute abnormality is seen. Electronically Signed   By: Inez Catalina M.D.   On: 09/26/2015 20:29   Dg Chest 2 View  10/11/2015  CLINICAL DATA:  Shortness of breath with productive cough, worse yesterday. Preoperative labs done yesterday for right lung biopsy on Monday. Smoker. EXAM: CHEST  2 VIEW COMPARISON:  Chest 10/10/2015.  CT chest 09/26/2015. FINDINGS: 2.6 cm diameter nodule in the left upper lung. 2.1 cm nodule in the left mid lung. Right hilar mass lesion with postobstructive change in the right lung base. Features are similar to previous study. Emphysematous changes in the aerated portions of the lungs. Borderline heart size with  normal pulmonary vascularity. No pneumothorax. Degenerative changes in the spine. IMPRESSION: Right hilar mass lesion with postobstructive change in the right lung base. Nodules in the left upper lung and left mid lung. No change since prior study. Findings likely malignant. Electronically Signed   By: Lucienne Capers M.D.   On: 10/11/2015 05:36   Dg Chest 2 View  10/10/2015  CLINICAL DATA:  Preoperative examination prior to left lung biopsy EXAM: CHEST  2 VIEW COMPARISON:  CT scan of the chest of September 26, 2015 FINDINGS: There is volume loss on the right which is not entirely new but more conspicuous than on the previous study. This is in part due to elevation of the right hemidiaphragm. There is right basilar atelectasis or pneumonia. On the left the known upper lobe mass is visible overlying the posterior aspect of the second rib. An additional subtle masses visible inferior to this. There is no definite pleural effusion though a small effusion on the right is not excluded. There is soft tissue fullness in the right paratracheal and right suprahilar region consistent with lymphadenopathy. The heart is not enlarged. The pulmonary vascularity is not engorged. There is mild multilevel degenerative disc disease of the thoracic spine. IMPRESSION: 1. Increased volume loss on the right likely due to postobstructive atelectasis as well as elevated right hemidiaphragm. 2. Persistent bulky right paratracheal lymphadenopathy. Two upper lobe left lung masses remain visible. 3. No evidence of CHF. Electronically Signed   By: David  Martinique M.D.   On: 10/10/2015 14:57   Ct Chest Wo Contrast  09/26/2015  CLINICAL DATA:  Shortness  of breath. CT abdomen today demonstrating right lower lobe pneumonia and right hilar adenopathy. EXAM: CT CHEST WITHOUT CONTRAST TECHNIQUE: Multidetector CT imaging of the chest was performed following the standard protocol without IV contrast. COMPARISON:  Included lung bases from CT abdomen  earlier today. FINDINGS: Mediastinum/Lymph Nodes: Extensive mediastinal adenopathy and masslike opacity in the right hilar and right mediastinum, with marked paratracheal thickening and circumferential soft tissue density posterior lateral to the mid distal trachea, encasing the trachea at the carina, and causing luminal narrowing of the right mainstem bronchus. Exact measurements are difficult due to the large nature, however measures at least 9.5 cm in greatest dimension. Individual lymph nodes versus pulmonary mass cannot be discretely identified given lack contrast in the conglomerate soft tissue nature. Separate enlarged AP window lymph node measures 1.5 cm short axis with additional superior mediastinal, prevascular, and AP window nodes. There is abnormal left axillary adenopathy. Multiple small right axillary lymph nodes also seen. Mild cardiomegaly. Moderate coronary artery calcifications. Atherosclerosis of the thoracic aorta which is normal in caliber. Physiologic pericardial fluid. Lungs/Pleura: Abnormal right hilar soft tissue density with spiculation, a discrete right lung nodule or mass is not seen due to the conglomerate nature and lack of intravenous contrast. There luminal narrowing and near complete occlusion of the right mainstem bronchus secondary to bronchial encasement. Mass encases the bronchus intermedius with marked narrowing of the middle and lower lobe bronchi. Volume loss in the right lung with small right pleural effusion. Ill-defined and nodular right lower lobe opaciteis. 2 spiculated nodules in the left upper lobe. More superior nodule measures 2.4 x 1.8 x 2.2 cm with peripheral cavitation. The more inferior nodule measures 2.1 x 1.6 x 2.0 cm. Both nodules have spiculated borders. Upper abdomen: Evaluated earlier this day at with CT abdomen/pelvis. Musculoskeletal: No destructive lytic or discrete blastic osseous lesion. Mild diffuse increase in osseous density, likely sequela of  renal osteodystrophy. IMPRESSION: 1. 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 Large right hilar/mediastinal masslike soft tissue density causing encasement and narrowing of the right mainstem bronchus and bronchus intermedius. Discrete mass difficult separate from the extensive multifocal mediastinal adenopathy. The right lower lobe opacities may be postobstructive atelectasis, spread of malignancy or pneumonia. Suspect bronchogenic malignancy, however primary lesion difficult to discern. 2. Two spiculated left upper lobe nodules, both approximately 2 cm, primary versus metastatic disease. These results were called by telephone at the time of interpretation on 09/26/2015 at 10:19 pm to Dr. Milton Ferguson , who verbally acknowledged these results. Electronically Signed   By: Jeb Levering M.D.   On: 09/26/2015 22:20   Discharge Medications:   Medication List    STOP taking these medications        levofloxacin 500 MG tablet  Commonly known as:  LEVAQUIN      TAKE these medications        acetaminophen 500 MG tablet  Commonly known as:  TYLENOL  Take 1,000 mg by mouth daily as needed for mild pain or headache.     amLODipine 10 MG tablet  Commonly known as:  NORVASC  TAKE 1 TABLET BY MOUTH DAILY, BEFORE BREAKFAST     amoxicillin-clavulanate 875-125 MG tablet  Commonly known as:  AUGMENTIN  Take 1 tablet by mouth 2 (two) times daily. One po bid x 7 days     atenolol 100 MG tablet  Commonly known as:  TENORMIN  TAKE 1/2 TABLET (50 MG TOTAL) BY MOUTH TWICE  DAILY.     CARBOPLATIN IV  Inject into the vein. Day 1 every 21 days     cloNIDine 0.2 MG tablet  Commonly known as:  CATAPRES  Take 1 tablet (0.2 mg total) by mouth 2 (two) times daily.     diazepam 5 MG tablet  Commonly known as:  VALIUM  Take 1 tablet (5 mg total) by mouth once. Take one tablet 1 hours prior to exam then take another one 1 hour later     ETOPOSIDE IV  Inject into the vein. Days 1-3 every 21 days     gabapentin  300 MG capsule  Commonly known as:  NEURONTIN  Take 1 capsule (300 mg total) by mouth daily as needed (nerve pain).     glipiZIDE 10 MG tablet  Commonly known as:  GLUCOTROL  TAKE 1 TABLET IN THE MORNING AND 1/2 TABLET IN THE EVENING     guaifenesin 400 MG Tabs tablet  Commonly known as:  HUMIBID E  Take 1 tablet (400 mg total) by mouth every 6 (six) hours.     HYDROcodone-acetaminophen 5-325 MG tablet  Commonly known as:  NORCO/VICODIN  Take 1 tablet by mouth every 8 (eight) hours as needed.     NEULASTA ONPRO [place]  Inject into the skin. To be given 27 hours after the completion of chemo (every 21 days)  Start taking on:  10/17/2015     ondansetron 8 MG tablet  Commonly known as:  ZOFRAN  Take 1 tablet (8 mg total) by mouth every 8 (eight) hours as needed for nausea or vomiting.     ondansetron 4 MG tablet  Commonly known as:  ZOFRAN  Take 1 tablet (4 mg total) by mouth 4 (four) times daily -  before meals and at bedtime.     pantoprazole 40 MG tablet  Commonly known as:  PROTONIX  TAKE 1 TABLET BY MOUTH TWICE A DAY BEFORE MEAL     polyethylene glycol packet  Commonly known as:  MIRALAX / GLYCOLAX  Take 17 g by mouth daily as needed for mild constipation (*tAKES TWICE DAILY SOMETIMES).     predniSONE 20 MG tablet  Commonly known as:  DELTASONE  Take on po daily     prochlorperazine 10 MG tablet  Commonly known as:  COMPAZINE  Take 1 tablet (10 mg total) by mouth every 6 (six) hours as needed for nausea or vomiting.     silver sulfADIAZINE 1 % cream  Commonly known as:  SILVADENE  APPLY TO AFFECTED AREA TWICE A DAY     TUMS ULTRA 1000 PO  Take 2 tablets by mouth daily as needed (indigestion).     valsartan 160 MG tablet  Commonly known as:  DIOVAN  Take 1 tablet (160 mg total) by mouth daily.     Vitamin D (Ergocalciferol) 50000 units Caps capsule  Commonly known as:  DRISDOL  Take 50,000 Units by mouth once a week. Takes on Saturdays.        Follow Up  Appointments: Follow-up Information    Follow up with [facility]   Why:  Follow up previously arranged      Signed: ZIMMERMAN,DONIELLE MPA-C 10/15/2015, 9:36 AM

## 2015-10-15 NOTE — Telephone Encounter (Signed)
Called patient and his wife in response to a message that he had called to say he did not "feel like" coming in today. At Dr. Gladys Damme instruction I explained to them that chemotherapy can not wait and that he will worsen quickly and with severe difficulty breathing and death. He stated to me that chemo "doesn't save any body's life, it just puts money in ya'lls pocket". I explained to him that we need to act fast to make him comfortable and prevent a severe respiratory situation for him where his lungs completely close off. I also relayed that the other alternative per Dr. Whitney Muse is Hospice to which he replied, "don't call anybody out to my house". He then gave the phone to his wife. I have explained the situation to her, she will try to talk to him but does not think he will come today but that maybe he will come tomorrow. I explained to her again that it is very detrimental to wait another day and that if treatment is not started we will need to make hospice referral so that she has some support at home.

## 2015-10-15 NOTE — Assessment & Plan Note (Deleted)
Extensive stage small cell lung cancer status post VATS with endobronchial ultrasound and mediastinal endoscopy by Dr. Roxan Hockey on 10/13/2015 resulting in aforementioned diagnosis.  Oncology history developed.  Staging completed in [facility] problem list.  I reviewed all data with the patient.  I personally reviewed and went over laboratory results with the patient.  The results are noted within this dictation. I personally reviewed and went over radiographic studies with the patient.  The results are noted within this dictation.  I personally reviewed and went over pathology results with the patient.  Patients with extended stage disease, the median survival is 8 to 13 months, and the five-year survival rate is 1 to 2 percent.  In most series, less than 5 percent of those with Extensive Stage SCLC survive beyond two years.  Prophylactic cranial irradiation decreases the incidence of symptomatic brain metastases in patients who have responded to systemic chemotherapy, although its impact on overall survival is uncertain. For patients with a response to systemic chemotherapy, thoracic radiation may be of benefit in increasing the percentage of long-term survivors.    PICC line is placed.  He will start systemic chemotherapy today, 10/15/2015, with Carboplatin/Etoposide (dose-reduced due to renal function).  He will receive Neulasta support via OnPro.  Treatment plan is built.  Pre-treatment labs today: CBC diff, CMET, Magnesium.  He will need MRI imaging of brain in the near future.  If he tolerates his first cycle of chemotherapy, he will be referred for port placement.  Dr. Roxan Hockey is his surgeon.  Return in 7-10 days for nadir check.  Labs in 7-10 days: CBC diff, CMET.  Return in 21 days for follow-up and cycle #2 of treatment.

## 2015-10-16 ENCOUNTER — Encounter (HOSPITAL_COMMUNITY): Payer: Self-pay

## 2015-10-16 ENCOUNTER — Encounter (HOSPITAL_COMMUNITY): Payer: Self-pay | Admitting: Oncology

## 2015-10-16 ENCOUNTER — Ambulatory Visit (HOSPITAL_COMMUNITY)
Admission: RE | Admit: 2015-10-16 | Discharge: 2015-10-16 | Disposition: A | Discharge status: Home / Self Care | Payer: Medicare Other | Source: Ambulatory Visit | Attending: Oncology | Admitting: Oncology

## 2015-10-16 ENCOUNTER — Ambulatory Visit (HOSPITAL_COMMUNITY): Payer: Medicare Other | Admitting: Oncology

## 2015-10-16 ENCOUNTER — Other Ambulatory Visit (HOSPITAL_COMMUNITY): Payer: Self-pay | Admitting: Oncology

## 2015-10-16 ENCOUNTER — Encounter (HOSPITAL_BASED_OUTPATIENT_CLINIC_OR_DEPARTMENT_OTHER): Payer: Medicaid Other | Admitting: Oncology

## 2015-10-16 ENCOUNTER — Encounter (HOSPITAL_BASED_OUTPATIENT_CLINIC_OR_DEPARTMENT_OTHER): Payer: Medicaid Other

## 2015-10-16 VITALS — BP 157/56 | HR 82 | Temp 97.8°F | Resp 20 | Wt 147.0 lb

## 2015-10-16 DIAGNOSIS — M7989 Other specified soft tissue disorders: Secondary | ICD-10-CM | POA: Diagnosis not present

## 2015-10-16 DIAGNOSIS — C3491 Malignant neoplasm of unspecified part of right bronchus or lung: Secondary | ICD-10-CM

## 2015-10-16 DIAGNOSIS — Z66 Do not resuscitate: Secondary | ICD-10-CM

## 2015-10-16 DIAGNOSIS — D500 Iron deficiency anemia secondary to blood loss (chronic): Secondary | ICD-10-CM | POA: Diagnosis not present

## 2015-10-16 DIAGNOSIS — C3490 Malignant neoplasm of unspecified part of unspecified bronchus or lung: Secondary | ICD-10-CM

## 2015-10-16 DIAGNOSIS — K219 Gastro-esophageal reflux disease without esophagitis: Secondary | ICD-10-CM

## 2015-10-16 HISTORY — DX: Do not resuscitate: Z66

## 2015-10-16 LAB — COMPREHENSIVE METABOLIC PANEL
ALT: 35 U/L (ref 17–63)
AST: 26 U/L (ref 15–41)
Albumin: 3.1 g/dL — ABNORMAL LOW (ref 3.5–5.0)
Alkaline Phosphatase: 78 U/L (ref 38–126)
Anion gap: 11 (ref 5–15)
BILIRUBIN TOTAL: 0.5 mg/dL (ref 0.3–1.2)
BUN: 101 mg/dL — AB (ref 6–20)
CHLORIDE: 102 mmol/L (ref 101–111)
CO2: 25 mmol/L (ref 22–32)
CREATININE: 3.82 mg/dL — AB (ref 0.61–1.24)
Calcium: 9.1 mg/dL (ref 8.9–10.3)
GFR calc non Af Amer: 15 mL/min — ABNORMAL LOW (ref >60)
GFR, EST AFRICAN AMERICAN: 18 mL/min — AB (ref >60)
Glucose, Bld: 194 mg/dL — ABNORMAL HIGH (ref 65–99)
POTASSIUM: 4.8 mmol/L (ref 3.5–5.1)
Sodium: 138 mmol/L (ref 135–145)
TOTAL PROTEIN: 6.4 g/dL — AB (ref 6.5–8.1)

## 2015-10-16 LAB — CBC WITH DIFFERENTIAL/PLATELET
BASOS ABS: 0 10*3/uL (ref 0.0–0.1)
Basophils Relative: 0 %
EOS PCT: 1 %
Eosinophils Absolute: 0.1 10*3/uL (ref 0.0–0.7)
HEMATOCRIT: 32.1 % — AB (ref 39.0–52.0)
Hemoglobin: 10.5 g/dL — ABNORMAL LOW (ref 13.0–17.0)
LYMPHS ABS: 0.7 10*3/uL (ref 0.7–4.0)
LYMPHS PCT: 6 %
MCH: 30 pg (ref 26.0–34.0)
MCHC: 32.7 g/dL (ref 30.0–36.0)
MCV: 91.7 fL (ref 78.0–100.0)
MONO ABS: 0.8 10*3/uL (ref 0.1–1.0)
Monocytes Relative: 7 %
NEUTROS ABS: 9.8 10*3/uL — AB (ref 1.7–7.7)
Neutrophils Relative %: 86 %
PLATELETS: 225 10*3/uL (ref 150–400)
RBC: 3.5 MIL/uL — ABNORMAL LOW (ref 4.22–5.81)
RDW: 15.7 % — AB (ref 11.5–15.5)
WBC: 11.4 10*3/uL — ABNORMAL HIGH (ref 4.0–10.5)

## 2015-10-16 MED ORDER — SODIUM CHLORIDE 0.9% FLUSH: 10.0000 mL | INTRAVENOUS | Status: DC | PRN

## 2015-10-16 MED ORDER — SODIUM CHLORIDE 0.9 % IV SOLN
215.5000 mg | Freq: Once | INTRAVENOUS | Status: AC
  Administered 2015-10-16: 220 mg via INTRAVENOUS
  Filled 2015-10-16: qty 22

## 2015-10-16 MED ORDER — OMEPRAZOLE 40 MG PO CPDR: 40.0000 mg | DELAYED_RELEASE_CAPSULE | Freq: Every day | ORAL | Status: DC

## 2015-10-16 MED ORDER — PALONOSETRON HCL INJECTION 0.25 MG/5ML
0.2500 mg | Freq: Once | INTRAVENOUS | Status: AC
  Administered 2015-10-16: 0.25 mg via INTRAVENOUS
  Filled 2015-10-16: qty 5

## 2015-10-16 MED ORDER — SODIUM CHLORIDE 0.9 % IV SOLN
10.0000 mg | Freq: Once | INTRAVENOUS | Status: AC
  Administered 2015-10-16: 10 mg via INTRAVENOUS
  Filled 2015-10-16: qty 1

## 2015-10-16 MED ORDER — SODIUM CHLORIDE 0.9 % IV SOLN
Freq: Once | INTRAVENOUS | Status: AC
  Administered 2015-10-16: 10:00:00 via INTRAVENOUS

## 2015-10-16 MED ORDER — ETOPOSIDE CHEMO INJECTION 1 GM/50ML
50.0000 mg/m2 | Freq: Once | INTRAVENOUS | Status: AC
  Administered 2015-10-16: 90 mg via INTRAVENOUS
  Filled 2015-10-16: qty 4.5

## 2015-10-16 NOTE — Assessment & Plan Note (Signed)
Extensive stage small cell lung cancer status post VATS with endobronchial ultrasound and mediastinal endoscopy by Dr. Roxan Hockey on 10/13/2015 resulting in aforementioned diagnosis.  Oncology history developed.  Staging completed in [facility] problem list.  DNR  I reviewed all data with the patient.  I personally reviewed and went over laboratory results with the patient.  The results are noted within this dictation. I personally reviewed and went over radiographic studies with the patient.  The results are noted within this dictation.  I personally reviewed and went over pathology results with the patient.  Patients with extended stage disease, the median survival is 8 to 13 months, and the five-year survival rate is 1 to 2 percent.  In most series, less than 5 percent of those with Extensive Stage SCLC survive beyond two years.  Prophylactic cranial irradiation decreases the incidence of symptomatic brain metastases in patients who have responded to systemic chemotherapy, although its impact on overall survival is uncertain. For patients with a response to systemic chemotherapy, thoracic radiation may be of benefit in increasing the percentage of long-term survivors.    PICC line not in place.  Will give first cycle of chemotherapy peripherally.  He will start systemic chemotherapy today, 10/16/2015, with Carboplatin/Etoposide (dose-reduced due to renal function).  He will receive Neulasta support via [facility].  Treatment plan is built.  He was a no-show for his 10/15/2015 appointment to start chemotherapy.  As a result, he will get treated in a day 1, 2 fashion this cycle only.  Subsequent cycles will be days 1-3.  CODE STATUS ADDRESSED.  He is a DNR.  Pre-treatment labs today: CBC diff, CMET, Magnesium.  He will need imaging of brain in the near future, but will not push for it now given the patient's resistance to treatment thus far.  He is asymptomatic.  He notes some burping.  He is taking  Protonix 40 mg daily.  He wishes to try another medication.  I will prescribe Prilosec 40 mg daily.  If ineffective, a trial of Dexilant may be indicated.  He has a left arm swelling that is not erythematous or hot.  He denies any tenderness to palpation.  He notes that it has been swollen since his EGD on 2/287/2017 by Dr. Oneida Alar.  He had an [place] of this arm in March 2017 that was negative.  Will repeat today.  Order is placed for a left UE [place] STAT to evaluate for DVT.  If he tolerates his first cycle of chemotherapy, he will be referred for port placement.  Dr. Roxan Hockey is his surgeon.  Return in 7-10 days for nadir check.  Labs in 7-10 days: CBC diff, CMET.  Return in 21 days for follow-up and cycle #2 of treatment.

## 2015-10-16 NOTE — Progress Notes (Signed)
[facility] Hematology/Oncology Consultation   Name: CHADWICK REISWIG      MRN: [id number]    Location: Room/bed info not found  Date: 10/16/2015 Time:2:03 PM    DIAGNOSIS:  Extensive stage small cell lung cancer  HISTORY OF PRESENT ILLNESS:  Mr. 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  He is now diagnosed with extensive stage small cell lung cancer.    Small cell lung cancer ([place])   09/26/2015 Initial Diagnosis Small cell lung cancer ([place])   09/26/2015 Imaging CT CAP- Extensive malignancy in the thorax. Large right hilar/mediastinal masslike soft tissue density causing encasement and narrowing of the right mainstem bronchus and bronchus intermedius. Discrete mass difficult separate from the extensive multifo..   09/26/2015 - 09/30/2015 Hospital Admission Epigastric pain   09/29/2015 Procedure Bronchoscopy by Dr. Luan Pulling   09/29/2015 Pathology Results Bronchial brushing and washing- BENIGN REACTIVE/REPARATIVE CHANGES.   10/13/2015 Procedure VIDEO BRONCHOSCOPY WITH ENDOBRONCHIAL ULTRASOUND and MEDIASTINOSCOPY by Dr. Roxan Hockey   10/14/2015 Pathology Results 1. Lymph node, biopsy, 2R - METASTATIC SMALL CELL CARCINOMA. 2. Lymph node, biopsy, cervical - ONE BENIGN LYMPH NODE. 3. Lymph node, biopsy, 2R #2 - METASTATIC SMALL CELL CARCINOMA.   10/16/2015 -  Chemotherapy Carboplatin/Etoposide (dose reduced based upon renal function).   10/16/2015 Code Status DNR   I personally reviewed and went over laboratory results with the patient.  The results are noted within this dictation.  Labs are updated today.  I personally reviewed and went over radiographic studies with the patient.  The results are noted within this dictation.    I personally reviewed and went over pathology results with the patient.  Chart is reviewed in  detail.  He did not show yesterday for his clinic appointment and the start of chemotherapy due to "feeling tired." He has chronic SOB but notes that this has gotten progressively worse. Yesterday he was uncertain if he was going to pursue chemotherapy. He shows today for treatment and further discussion.   He reports some burping.  He is on Protonix 40 mg daily.  He quit smoking 3 weeks ago he reports, but he has a heavy smoking history.  Review of Systems  Constitutional: Positive for malaise/fatigue. Negative for fever and chills.  HENT: Negative.  Negative for hearing loss and tinnitus.   Eyes: Negative.  Negative for blurred vision and double vision.  Respiratory: Positive for cough and shortness of breath. Negative for hemoptysis, sputum production and wheezing.   Cardiovascular: Negative.  Negative for chest pain and claudication.  Gastrointestinal: Positive for heartburn. Negative for nausea, vomiting, abdominal pain, diarrhea, constipation and blood in stool.  Genitourinary: Negative for dysuria, urgency and frequency.  Musculoskeletal: Negative for myalgias and falls.  Skin: Negative.   Neurological: Positive for weakness. Negative for dizziness, tingling, speech change, focal weakness, seizures, loss of consciousness and headaches.  Endo/Heme/Allergies: Does not bruise/bleed easily.  Psychiatric/Behavioral: Positive for substance abuse (Tobacco abuse). Negative for depression.   PAST MEDICAL HISTORY:  14 point review of systems was performed and is negative except as detailed under history of present illness and above   Past Medical History  Diagnosis Date  . Diverticulosis 03/04/10  . Peripheral vascular disease, unspecified ([place])   . Undiagnosed cardiac murmurs   . Other symptoms involving cardiovascular system   . HLD (  hyperlipidemia)   . HTN (hypertension)   . Type II or unspecified type diabetes mellitus without mention of complication, not stated as uncontrolled   .  Ulcer   . Carotid artery occlusion   . Umbilical hernia now    has not been repaired  . Blood transfusion   . AVM (arteriovenous malformation) of colon with hemorrhage   . Gastritis   . Internal hemorrhoids   . Right carotid bruit   . Shortness of breath     noted /w low Hgb  . GERD (gastroesophageal reflux disease)   . Arthritis     back  . Anemia     8/3,4,5- blood transfusion- [facility], colonoscopy- done & found 2 areas of bleeding   . Tobacco use disorder   . Anxiety   . CHF (congestive heart failure) (HCC)     3 yrs ago  . Irregular heart beat   . Iron deficiency anemia due to chronic blood loss 10/07/2010  . Pneumonia April 2017  . Chronic kidney disease     renal failure /w osteomyelitis- 2010  . Chronic renal disease, stage 5, glomerular filtration rate less than or equal to 15 mL/min/1.73 square meter (HCC) 10/07/2010  . Difficulty swallowing solids   . Lung mass 09/26/2015  . DNR (do not resuscitate) 10/16/2015    10/16/2015    ALLERGIES: Allergies  Allergen Reactions  . Zocor [Simvastatin] Palpitations    Chest soreness  . Asa [Aspirin] Other (See Comments)    GI Bleed  . Doxycycline Diarrhea  . Hctz [Hydrochlorothiazide]     Leg cramps  . Iodine Hives    IVP dye  . Sulfa Antibiotics Other (See Comments)    Unknown       MEDICATIONS: I have reviewed the patient's current medications.    Current Outpatient Prescriptions on File Prior to Visit  Medication Sig Dispense Refill  . acetaminophen (TYLENOL) 500 MG tablet Take 1,000 mg by mouth daily as needed for mild pain or headache.    Marland Kitchen amLODipine (NORVASC) 10 MG tablet TAKE 1 TABLET BY MOUTH DAILY, BEFORE BREAKFAST 90 tablet 1  . amoxicillin-clavulanate (AUGMENTIN) 875-125 MG tablet Take 1 tablet by mouth 2 (two) times daily.    Marland Kitchen atenolol (TENORMIN) 100 MG tablet TAKE 1/2 TABLET (50 MG TOTAL) BY MOUTH TWICE  DAILY. 90 tablet 1  . Calcium Carbonate Antacid (TUMS ULTRA 1000 PO) Take 2 tablets by mouth daily as  needed (indigestion).     . CARBOPLATIN IV Inject into the vein. Day 1 every 21 days    . cloNIDine (CATAPRES) 0.2 MG tablet Take 1 tablet (0.2 mg total) by mouth 2 (two) times daily. 60 tablet 3  . diazepam (VALIUM) 5 MG tablet Take 5 mg by mouth daily.    . ETOPOSIDE IV Inject into the vein. Days 1-3 every 21 days    . gabapentin (NEURONTIN) 300 MG capsule Take 1 capsule (300 mg total) by mouth daily as needed (nerve pain). 270 capsule 3  . glipiZIDE (GLUCOTROL) 10 MG tablet TAKE 1 TABLET IN THE MORNING AND 1/2 TABLET IN THE EVENING (Patient taking differently: TAKE 1 TABLET IN THE MORNING AND 1/2 TABLET IN THE EVENING PRN for blood sugar > 200) 135 tablet 0  . guaifenesin (HUMIBID E) 400 MG TABS tablet Take 1 tablet (400 mg total) by mouth every 6 (six) hours. 84 tablet 0  . HYDROcodone-acetaminophen (NORCO/VICODIN) 5-325 MG tablet Take 1 tablet by mouth every 8 (eight) hours as needed. 30  tablet 0  . omeprazole (PRILOSEC) 40 MG capsule Take 1 capsule (40 mg total) by mouth daily. 30 capsule 2  . ondansetron (ZOFRAN) 4 MG tablet Take 1 tablet (4 mg total) by mouth 4 (four) times daily -  before meals and at bedtime. 120 tablet 1  . ondansetron (ZOFRAN) 8 MG tablet Take 1 tablet (8 mg total) by mouth every 8 (eight) hours as needed for nausea or vomiting. 30 tablet 2  . pantoprazole (PROTONIX) 40 MG tablet Take 1 tablet by mouth daily.  1  . [START ON 10/17/2015] Pegfilgrastim (NEULASTA ONPRO [place]) Inject into the skin. To be given 27 hours after the completion of chemo (every 21 days)    . polyethylene glycol (MIRALAX / GLYCOLAX) packet Take 17 g by mouth daily as needed for mild constipation (*tAKES TWICE DAILY SOMETIMES).     . predniSONE (DELTASONE) 20 MG tablet Take on po daily 11 tablet 0  . prochlorperazine (COMPAZINE) 10 MG tablet Take 1 tablet (10 mg total) by mouth every 6 (six) hours as needed for nausea or vomiting. 30 tablet 2  . silver sulfADIAZINE (SILVADENE) 1 % cream APPLY TO  AFFECTED AREA TWICE A DAY (Patient taking differently: Apply 1 application topically 2 (two) times daily as needed (sore skin). ) 240 g 1  . valsartan (DIOVAN) 160 MG tablet Take 1 tablet (160 mg total) by mouth daily. 30 tablet 5  . Vitamin D, Ergocalciferol, (DRISDOL) 50000 units CAPS capsule Take 50,000 Units by mouth once a week. Takes on Saturdays.  0   No current facility-administered medications on file prior to visit.     PAST SURGICAL HISTORY Past Surgical History  Procedure Laterality Date  . Colonoscopy  03/04/2010    Dr. Alysia Penna AMV without mention of ablation, scattered diverticula, internal hemorrhoids  . Bilateral common superficial femoral and profudus artery endarterectomies      2003    Dr. Tawanna Cooler Early  . Esophagogastroduodenoscopy  03/2000    Dr. Barbaraann Rondo gastritis, H.Pylori gastritis, ?treated  . Esophagogastroduodenoscopy  05/2006    Dr. Jeanie Sewer recurrent GI bleed. antral gastritis, single gastric AVM s/p APC, CLO results?  . Colonoscopy  11/2004    Dr. Georgeann Oppenheim  . 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 Both of these are ablated with argon plasma coagulator./ Small external hemorrhoids  . Esophagogastroduodenoscopy  01/21/2012    SLF: MILD TO MAODERATE Gastritis/ SLOW GIB LIKEY DUE TO PT BEING ON ASA ND SUBSEQUENT BLOOD/ LOSS FROM AVMS IN STOMACH AND COLON AS WELL AS GASTRITIS  . Enteroscopy  01/21/2012    Procedure: ENTEROSCOPY;  Surgeon: West Bali, MD;  [facility];  Service: Endoscopy;;  . Below knee leg amputation      2010, wears prosthesis   . Endarterectomy  02/09/2012    Procedure: ENDARTERECTOMY CAROTID;  Surgeon: Larina Earthly, MD;  Location: [facility] OR;  Service: Vascular;  Laterality: Left;  . Carotid endarterectomy Left 02-09-12  . Esophagogastroduodenoscopy N/A 09/03/2013     Mild chronic gastritis. benign gastric polyps  . Givens capsule study N/A 09/03/2013    Dr. Darrick Penna: small bowel and colonic AVMs. no active bleeding  . Esophagogastroduodenoscopy N/A 08/19/2015    Dr. Darrick Penna: 2 large gastric AVMs in fundus, 1 actively bleeding s/p APC and clip placement, empiric Savary dilation  . Savory dilation  N/A 08/19/2015    Procedure: SAVORY DILATION;  Surgeon: West Bali, MD;  Location: [facility];  Service: Endoscopy;  Laterality: N/A;  . Flexible bronchoscopy N/A 09/29/2015    Procedure: FLEXIBLE BRONCHOSCOPY;  Surgeon: Kari Baars, MD;  [facility]: [facility];  Service: Cardiopulmonary;  Laterality: N/A;  . Bronchial brushings  09/29/2015    Procedure: BRONCHIAL BRUSHINGS;  Surgeon: Kari Baars, MD;  [facility];  Service: Cardiopulmonary;;  Tracheal and left lung  . Bronchial washings  09/29/2015    Procedure: BRONCHIAL WASHINGS;  Surgeon: Kari Baars, MD;  Location: [facility];  Service: Cardiopulmonary;;  Bilatersl washings  . Vascular surgery      rt bka and rt 1st toe amputation  . Video bronchoscopy with endobronchial ultrasound N/A 10/13/2015    Procedure: VIDEO BRONCHOSCOPY WITH ENDOBRONCHIAL ULTRASOUND;  Surgeon: Loreli Slot, MD;  Location: [facility] OR;  Service: Thoracic;  Laterality: N/A;  . Mediastinoscopy N/A 10/13/2015    Procedure: MEDIASTINOSCOPY;  Surgeon: Loreli Slot, MD;  Location: [facility] OR;  Service: Thoracic;  Laterality: N/A;    FAMILY HISTORY: Family History  Problem Relation Age of Onset  . Colon cancer Brother 50    deceased  . Hyperlipidemia Brother   . Hypertension Brother   . Lung cancer Sister     deceased, three primary cancers: lung, esophageal, lymphoma.   . Diabetes Sister   . Hypertension Sister   . Cancer Father     gallbladder  . Hyperlipidemia Father   . Hypertension Father   . Cancer Brother     unknown primary  . Cancer Sister     unknown primary  . Hyperlipidemia Sister    . Diabetes Mother   . Hypertension Mother     SOCIAL HISTORY:  reports that he has been smoking Cigarettes.  He has a 100 pack-year smoking history. He quit smokeless tobacco use about 3 years ago. He reports that he does not drink alcohol or use illicit drugs.  PERFORMANCE STATUS: The patient's performance status is 2 - Symptomatic, <50% confined to bed  PHYSICAL EXAM: Most Recent Vital Signs: There were no vitals taken for this visit. General appearance: alert, cooperative, appears older than stated age, no distress and accompanied by his wife. Head: Normocephalic, without obvious abnormality, atraumatic Eyes: negative findings: lids and lashes normal, conjunctivae and sclerae normal and corneas clear Neck: supple, symmetrical, trachea midline Lungs: Significantly decreased breath sounds in RML and RLL. Heart: regular rate and rhythm, S1, S2 normal, no murmur, click, rub or gallop Abdomen: soft, non-tender; bowel sounds normal; no masses,  no organomegaly Extremities: L UE edema without erythema, heat, or tenderness on deep palpation.  Left dorsal aspect of hand with 2+ pitting edema. Neurologic: Grossly normal  LABORATORY DATA:  Results for orders placed or performed in visit on 10/16/15 (from the past 48 hour(s))  CBC with Differential     Status: Abnormal   Collection Time: 10/16/15  8:55 AM  Result Value Ref Range   WBC 11.4 (H) 4.0 - 10.5 K/uL   RBC 3.50 (L) 4.22 - 5.81 MIL/uL   Hemoglobin 10.5 (L) 13.0 - 17.0 g/dL   HCT 50.2 (L) 71.4 - 23.2 %   MCV 91.7 78.0 - 100.0 fL   MCH 30.0 26.0 - 34.0 pg   MCHC 32.7 30.0 - 36.0 g/dL   RDW 00.9 (H) 41.7 - 91.9 %   Platelets 225 150 - 400 K/uL   Neutrophils Relative % 86 %  Neutro Abs 9.8 (H) 1.7 - 7.7 K/uL   Lymphocytes Relative 6 %   Lymphs Abs 0.7 0.7 - 4.0 K/uL   Monocytes Relative 7 %   Monocytes Absolute 0.8 0.1 - 1.0 K/uL   Eosinophils Relative 1 %   Eosinophils Absolute 0.1 0.0 - 0.7 K/uL   Basophils Relative 0 %    Basophils Absolute 0.0 0.0 - 0.1 K/uL  Comprehensive metabolic panel     Status: Abnormal   Collection Time: 10/16/15  8:55 AM  Result Value Ref Range   Sodium 138 135 - 145 mmol/L   Potassium 4.8 3.5 - 5.1 mmol/L   Chloride 102 101 - 111 mmol/L   CO2 25 22 - 32 mmol/L   Glucose, Bld 194 (H) 65 - 99 mg/dL   BUN 101 (H) 6 - 20 mg/dL   Creatinine, Ser 3.82 (H) 0.61 - 1.24 mg/dL   Calcium 9.1 8.9 - 10.3 mg/dL   Total Protein 6.4 (L) 6.5 - 8.1 g/dL   Albumin 3.1 (L) 3.5 - 5.0 g/dL   AST 26 15 - 41 U/L   ALT 35 17 - 63 U/L   Alkaline Phosphatase 78 38 - 126 U/L   Total Bilirubin 0.5 0.3 - 1.2 mg/dL   GFR calc non Af Amer 15 (L) >60 mL/min   GFR calc Af Amer 18 (L) >60 mL/min    Comment: (NOTE) The eGFR has been calculated using the CKD EPI equation. This calculation has not been validated in all clinical situations. eGFR's persistently <60 mL/min signify possible Chronic Kidney Disease.    Anion gap 11 5 - 15      RADIOGRAPHY: US Venous Img Upper Uni Left  10/16/2015  CLINICAL DATA:  65 year old male with left upper extremity swelling EXAM: LEFT UPPER EXTREMITY VENOUS DOPPLER ULTRASOUND TECHNIQUE: Gray-scale sonography with graded compression, as well as color Doppler and duplex ultrasound were performed to evaluate the upper extremity deep venous system from the level of the subclavian vein and including the jugular, axillary, basilic, radial, ulnar and upper cephalic vein. Spectral Doppler was utilized to evaluate flow at rest and with distal augmentation maneuvers. COMPARISON:  Prior ultrasound 09/01/2015 FINDINGS: Contralateral Subclavian Vein: Respiratory phasicity is normal and symmetric with the symptomatic side. No evidence of thrombus. Normal compressibility. Internal Jugular Vein: No evidence of thrombus. Normal compressibility, respiratory phasicity and response to augmentation. Subclavian Vein: No evidence of thrombus. Normal compressibility, respiratory phasicity and response to  augmentation. Axillary Vein: No evidence of thrombus. Normal compressibility, respiratory phasicity and response to augmentation. Cephalic Vein: No evidence of thrombus. Normal compressibility, respiratory phasicity and response to augmentation. Basilic Vein: No evidence of thrombus. Normal compressibility, respiratory phasicity and response to augmentation. Brachial Veins: No evidence of thrombus. Normal compressibility, respiratory phasicity and response to augmentation. Radial Veins: No evidence of thrombus. Normal compressibility, respiratory phasicity and response to augmentation. Ulnar Veins: No evidence of thrombus. Normal compressibility, respiratory phasicity and response to augmentation. Venous Reflux:  None visualized. Other Findings: Edema noted in the superficial soft tissues of the left forearm IMPRESSION: No evidence of deep venous thrombosis. Electronically Signed   By: Jacqulynn Cadet M.D.   On: 10/16/2015 13:44     CLINICAL DATA: Shortness of breath. CT abdomen today demonstrating right lower lobe pneumonia and right hilar adenopathy.  EXAM: CT CHEST WITHOUT CONTRAST  TECHNIQUE: Multidetector CT imaging of the chest was performed following the standard protocol without IV contrast.  COMPARISON: Included lung bases from CT abdomen earlier today.  FINDINGS: Mediastinum/Lymph Nodes: Extensive mediastinal adenopathy and masslike opacity in the right hilar and right mediastinum, with marked paratracheal thickening and circumferential soft tissue density posterior lateral to the mid distal trachea, encasing the trachea at the carina, and causing luminal narrowing of the right mainstem bronchus. Exact measurements are difficult due to the large nature, however measures at least 9.5 cm in greatest dimension. Individual lymph nodes versus pulmonary mass cannot be discretely identified given lack contrast in the conglomerate soft tissue nature. Separate enlarged AP window  lymph node measures 1.5 cm short axis with additional superior mediastinal, prevascular, and AP window nodes. There is abnormal left axillary adenopathy. Multiple small right axillary lymph nodes also seen. Mild cardiomegaly. Moderate coronary artery calcifications. Atherosclerosis of the thoracic aorta which is normal in caliber. Physiologic pericardial fluid.  Lungs/Pleura: Abnormal right hilar soft tissue density with spiculation, a discrete right lung nodule or mass is not seen due to the conglomerate nature and lack of intravenous contrast. There luminal narrowing and near complete occlusion of the right mainstem bronchus secondary to bronchial encasement. Mass encases the bronchus intermedius with marked narrowing of the middle and lower lobe bronchi. Volume loss in the right lung with small right pleural effusion. Ill-defined and nodular right lower lobe opaciteis. 2 spiculated nodules in the left upper lobe. More superior nodule measures 2.4 x 1.8 x 2.2 cm with peripheral cavitation. The more inferior nodule measures 2.1 x 1.6 x 2.0 cm. Both nodules have spiculated borders.  Upper abdomen: Evaluated earlier this day at with CT abdomen/pelvis.  Musculoskeletal: No destructive lytic or discrete blastic osseous lesion. 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  IMPRESSION: 1. 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 Large right hilar/mediastinal masslike soft tissue density causing encasement and narrowing of the right mainstem bronchus and bronchus intermedius. Discrete mass difficult separate from the extensive multifocal mediastinal adenopathy. The right lower lobe opacities may be postobstructive atelectasis, spread of malignancy or pneumonia. Suspect bronchogenic malignancy, however primary lesion difficult to discern. 2. Two spiculated left upper lobe nodules, both approximately 2 cm, primary versus metastatic disease. These  results were called by telephone at the time of interpretation on 09/26/2015 at 10:19 pm to Dr. Milton Ferguson , who verbally acknowledged these results.   Electronically Signed  By: Jeb Levering M.D.  On: 09/26/2015 22:20    PATHOLOGY:    Diagnosis 1. Lymph node, biopsy, 2R - METASTATIC SMALL CELL CARCINOMA. 2. Lymph node, biopsy, cervical - ONE BENIGN LYMPH NODE. 3. Lymph node, biopsy, 2R #2 - METASTATIC SMALL CELL CARCINOMA. Claudette Laws MD Pathologist, Electronic Signature (Case signed 10/14/2015)   ASSESSMENT/PLAN: Small cell lung cancer ([place]) Extensive stage small cell lung cancer status post VATS with endobronchial ultrasound and mediastinal endoscopy by Dr. Roxan Hockey on 10/13/2015 resulting in aforementioned diagnosis. Imaging appears c/w extensive stage disease. I will have radiation oncology review as well however. I do not think at this point that the patient and his wife would be willing to drive to XRT daily. Coming here for chemotherapy is already a financial strain.   Oncology history developed.  Staging completed in [facility] problem list.  DNR  I reviewed all data with the patient.  I personally reviewed and went over laboratory results with the patient.  The results are noted within this dictation. I personally reviewed and went over radiographic studies with the patient.  The results are noted within this dictation.  I personally reviewed and went over pathology results with the patient.  Patients with extended stage disease, the median survival is 8 to 13 months, and the five-year survival rate is 1 to 2 percent.  In most series, less than 5 percent of those with Extensive Stage SCLC survive beyond two years.  Prophylactic cranial irradiation decreases the incidence of symptomatic brain metastases in patients who have responded to systemic chemotherapy, although its impact on overall survival is uncertain. For patients with a response to systemic  chemotherapy, thoracic radiation may be of benefit in increasing the percentage of long-term survivors.    PICC line not in place.  Will give first cycle of chemotherapy peripherally.  He will start systemic chemotherapy today, 10/16/2015, with Carboplatin/Etoposide (dose-reduced due to renal function).  He will receive Neulasta support via OnPro.  Treatment plan is built.  He was a no-show for his 10/15/2015 appointment to start chemotherapy.  As a result, he will get treated in a day 1, 2 fashion this cycle only.  Subsequent cycles will be days 1-3.  CODE STATUS ADDRESSED.  He is a DNR.  Pre-treatment labs today: CBC diff, CMET, Magnesium.  He will need imaging of brain in the near future, but will not push for it now given the patient's resistance to treatment thus far.  He is asymptomatic.  He notes some burping.  He is taking Protonix 40 mg daily.  He wishes to try another medication.  I will prescribe Prilosec 40 mg daily.  If ineffective, a trial of Dexilant may be indicated.  He has a left arm swelling that is not erythematous or hot.  He denies any tenderness to palpation.  He notes that it has been swollen since his EGD on 2/287/2017 by Dr. Oneida Alar.  He had an [place] of this arm in March 2017 that was negative.  Will repeat today.  Order is placed for a left UE [place] STAT to evaluate for DVT.  If he tolerates his first cycle of chemotherapy, he will be referred for port placement.  Dr. Roxan Hockey is his surgeon. He is not sure he wants to travel back to [place].   Return in 7-10 days for nadir check.  Labs in 7-10 days: CBC diff, CMET.  Return in 21 days for follow-up and cycle #2 of treatment.   He and his wife do seem to have a good understanding of how serious his disease is. I do not think his reluctance to therapy/testing is because he does not understand. Any decisions he makes I believe are made out of his desire.   All questions were answered. The patient knows to call the clinic with  any problems, questions or concerns. We can certainly see the patient much sooner if necessary.  This note is electronically signed by: Molli Hazard, MD  10/16/2015 2:03 PM

## 2015-10-16 NOTE — Progress Notes (Signed)
Tolerated treatment well. To return for day 2 on Friday

## 2015-10-16 NOTE — Patient Instructions (Signed)
[facility] Discharge Instructions for Patients Receiving Chemotherapy   Beginning January 23rd 2017 lab work for the [facility] will be done in the  Main lab at [facility] on 1st floor. If you have a lab appointment with the [facility] please come in thru the  Main Entrance and check in at the main information desk   Today you received the following chemotherapy agents Carbo and etoposide Ultrasound on your way out today  To help prevent nausea and vomiting after your treatment, we encourage you to take your nausea medication     If you develop nausea and vomiting, or diarrhea that is not controlled by your medication, call the clinic.  The clinic phone number is [phone]. Office hours are Monday-Friday 8:30am-5:00pm.  BELOW ARE SYMPTOMS THAT SHOULD BE REPORTED IMMEDIATELY:  *FEVER GREATER THAN 101.0 F  *CHILLS WITH OR WITHOUT FEVER  NAUSEA AND VOMITING THAT IS NOT CONTROLLED WITH YOUR NAUSEA MEDICATION  *UNUSUAL SHORTNESS OF BREATH  *UNUSUAL BRUISING OR BLEEDING  TENDERNESS IN MOUTH AND THROAT WITH OR WITHOUT PRESENCE OF ULCERS  *URINARY PROBLEMS  *BOWEL PROBLEMS  UNUSUAL RASH Items with * indicate a potential emergency and should be followed up as soon as possible. If you have an emergency after office hours please contact your primary care physician or go to the nearest emergency department.  Please call the clinic during office hours if you have any questions or concerns.   You may also contact the Patient Navigator at [phone] should you have any questions or need assistance in obtaining follow up care.      Resources For Cancer Patients and their Caregivers ? American Cancer Society: Can assist with transportation, wigs, general needs, runs Look Good Feel Better.        (803) 864-7958 ? Cancer Care: Provides financial assistance, online support groups, medication/co-pay assistance.  1-800-813-HOPE 9057230673) ? [facility] Assists [place] cancer patients and their families through emotional , educational and financial support.  450-008-6236 ? [place] DSS Where to apply for food stamps, Medicaid and utility assistance. (647)006-4441 ? RCATS: Transportation to medical appointments. 908-667-4750 ? Social Security Administration: May apply for disability if have a Stage IV cancer. 254-738-3705 (403)052-2918 ? [employer], Disability and Transit Services: Assists with nutrition, care and transit needs. [place] at [facility] Discharge Instructions  RECOMMENDATIONS MADE BY THE CONSULTANT AND ANY TEST RESULTS WILL BE SENT TO YOUR REFERRING PHYSICIAN.  Discussion per T. Kefalas PA-C Carbo and Etoposide today Go by x ray today to have ultrasound of left arm  Thank you for choosing [place] at [facility] to provide your oncology and hematology care.  To afford each patient quality time with our provider, please arrive at least 15 minutes before your scheduled appointment time.   Beginning January 23rd 2017 lab work for the [facility] will be done in the  Main lab at [facility] on 1st floor. If you have a lab appointment with the [facility] please come in thru the  Main Entrance and check in at the main information desk  You need to re-schedule your appointment should you arrive 10 or more minutes late.  We strive to give you quality time with our providers, and arriving late affects you and other patients whose appointments are after yours.  Also, if you no show three or more times for  appointments you may be dismissed from the clinic at the providers discretion.     Again, thank you for choosing [facility].  Our hope is that these requests will decrease the amount of time that you wait before being seen by our physicians.        _____________________________________________________________  Should you have questions after your visit to [facility], please contact our office at [phone] between the hours of 8:30 a.m. and 4:30 p.m.  Voicemails left after 4:30 p.m. will not be returned until the following business day.  For prescription refill requests, have your pharmacy contact our office.         Resources For Cancer Patients and their Caregivers ? American Cancer Society: Can assist with transportation, wigs, general needs, runs Look Good Feel Better.        949-253-6318 ? Cancer Care: Provides financial assistance, online support groups, medication/co-pay assistance.  1-800-813-HOPE 9135387387) ? [facility] Assists [place] cancer patients and their families through emotional , educational and financial support.  279-452-3595 ? [place] DSS Where to apply for food stamps, Medicaid and utility assistance. [phone] ? RCATS: Transportation to medical appointments. 445 763 2543 ? Social Security Administration: May apply for disability if have a Stage IV cancer. 403-199-5595 224-213-9184 ? [employer], Disability and Transit Services: Assists with nutrition, care and transit needs. [phone]

## 2015-10-16 NOTE — Patient Instructions (Signed)
[place] at [facility]  Discharge Instructions:  Patient seen by Kirby Crigler, PA today and DNR form was completed and given to patient. You will discontinue your Protonix medication and start Prilosec as prescribed. Ultrasound to Left Arm today after you treatment. Labs 7-10 days with follow-up. Follow-up cycle #2 with Dr. Whitney Muse _______________________________________________________________  Thank you for choosing [place] at [facility] to provide your oncology and hematology care.  To afford each patient quality time with our providers, please arrive at least 15 minutes before your scheduled appointment.  You need to re-schedule your appointment if you arrive 10 or more minutes late.  We strive to give you quality time with our providers, and arriving late affects you and other patients whose appointments are after yours.  Also, if you no show three or more times for appointments you may be dismissed from the clinic.  Again, thank you for choosing [place] [facility] [place] hope is that these requests will allow you access to exceptional care and in a timely manner. _______________________________________________________________  If you have questions after your visit, please contact our office at [phone] between the hours of 8:30 a.m. and 5:00 p.m. Voicemails left after 4:30 p.m. will not be returned until the following business day. _______________________________________________________________  For prescription refill requests, have your pharmacy contact our office. _______________________________________________________________  Recommendations made by the consultant and any test results will be sent to your referring physician. _______________________________________________________________

## 2015-10-16 NOTE — Progress Notes (Signed)
Chemo teaching done and consent signed for Carboplatin & Etoposide. Distress screening done. Gas voucher application will be given to patient.

## 2015-10-17 ENCOUNTER — Encounter (HOSPITAL_COMMUNITY): Payer: Self-pay

## 2015-10-17 ENCOUNTER — Encounter (HOSPITAL_BASED_OUTPATIENT_CLINIC_OR_DEPARTMENT_OTHER): Payer: Medicaid Other

## 2015-10-17 VITALS — BP 148/55 | HR 75 | Temp 97.3°F | Resp 20 | Wt 147.0 lb

## 2015-10-17 DIAGNOSIS — Z5111 Encounter for antineoplastic chemotherapy: Secondary | ICD-10-CM | POA: Diagnosis not present

## 2015-10-17 DIAGNOSIS — C3491 Malignant neoplasm of unspecified part of right bronchus or lung: Secondary | ICD-10-CM | POA: Diagnosis not present

## 2015-10-17 DIAGNOSIS — Z5189 Encounter for other specified aftercare: Secondary | ICD-10-CM

## 2015-10-17 MED ORDER — SODIUM CHLORIDE 0.9 % IV SOLN
50.0000 mg/m2 | Freq: Once | INTRAVENOUS | Status: AC
  Administered 2015-10-17: 90 mg via INTRAVENOUS
  Filled 2015-10-17: qty 4.5

## 2015-10-17 MED ORDER — PEGFILGRASTIM 6 MG/0.6ML ~~LOC~~ PSKT
6.0000 mg | PREFILLED_SYRINGE | Freq: Once | SUBCUTANEOUS | Status: AC
  Administered 2015-10-17: 6 mg via SUBCUTANEOUS

## 2015-10-17 MED ORDER — PEGFILGRASTIM 6 MG/0.6ML ~~LOC~~ PSKT
PREFILLED_SYRINGE | SUBCUTANEOUS | Status: AC
  Filled 2015-10-17: qty 0.6

## 2015-10-17 MED ORDER — SODIUM CHLORIDE 0.9% FLUSH
10.0000 mL | INTRAVENOUS | Status: DC | PRN
  Administered 2015-10-17: 10 mL
  Filled 2015-10-17: qty 10

## 2015-10-17 MED ORDER — SODIUM CHLORIDE 0.9 % IV SOLN
Freq: Once | INTRAVENOUS | Status: AC
  Administered 2015-10-17: 12:00:00 via INTRAVENOUS

## 2015-10-17 MED ORDER — DEXAMETHASONE SODIUM PHOSPHATE 100 MG/10ML IJ SOLN
10.0000 mg | Freq: Once | INTRAMUSCULAR | Status: AC
  Administered 2015-10-17: 10 mg via INTRAVENOUS
  Filled 2015-10-17: qty 1

## 2015-10-17 NOTE — Progress Notes (Signed)
Patient tolerated treatment well.

## 2015-10-17 NOTE — Patient Instructions (Signed)
[facility] Discharge Instructions for Patients Receiving Chemotherapy   Beginning January 23rd 2017 lab work for the Cancer Center will be done in the  Main lab at [facility] on 1st floor. If you have a lab appointment with the Cancer Center please come in thru the  Main Entrance and check in at the main information desk   Today you received the following chemotherapy agents Etoposide  To help prevent nausea and vomiting after your treatment, we encourage you to take your nausea medication   If you develop nausea and vomiting, or diarrhea that is not controlled by your medication, call the clinic.  The clinic phone number is [phone]. Office hours are Monday-Friday 8:30am-5:00pm.  BELOW ARE SYMPTOMS THAT SHOULD BE REPORTED IMMEDIATELY:  *FEVER GREATER THAN 101.0 F  *CHILLS WITH OR WITHOUT FEVER  NAUSEA AND VOMITING THAT IS NOT CONTROLLED WITH YOUR NAUSEA MEDICATION  *UNUSUAL SHORTNESS OF BREATH  *UNUSUAL BRUISING OR BLEEDING  TENDERNESS IN MOUTH AND THROAT WITH OR WITHOUT PRESENCE OF ULCERS  *URINARY PROBLEMS  *BOWEL PROBLEMS  UNUSUAL RASH Items with * indicate a potential emergency and should be followed up as soon as possible. If you have an emergency after office hours please contact your primary care physician or go to the nearest emergency department.  Please call the clinic during office hours if you have any questions or concerns.   You may also contact the Patient Navigator at [phone] should you have any questions or need assistance in obtaining follow up care.      Resources For Cancer Patients and their Caregivers ? American Cancer Society: Can assist with transportation, wigs, general needs, runs Look Good Feel Better.        1-888-227-6333 ? Cancer Care: Provides financial assistance, online support groups, medication/co-pay assistance.  1-800-813-HOPE (4673) ? [facility] Assists [place]  cancer patients and their families through emotional , educational and financial support.  336-427-4357 ? [place] DSS Where to apply for food stamps, Medicaid and utility assistance. 336-342-1394 ? RCATS: Transportation to medical appointments. 336-347-2287 ? Social Security Administration: May apply for disability if have a Stage IV cancer. 336-342-7796 1-800-772-1213 ? [place] [employer], Disability and Transit Services: Assists with nutrition, care and transit needs. [phone]          

## 2015-10-20 ENCOUNTER — Telehealth (HOSPITAL_COMMUNITY): Payer: Self-pay | Admitting: *Deleted

## 2015-10-20 NOTE — Telephone Encounter (Signed)
Called patient for 24 ht f/u post first chemo. His wife states that he has tolerated well. Only reports slight nausea which he was having prior to chemo. This is relieved by nausea meds.

## 2015-10-21 ENCOUNTER — Ambulatory Visit: Payer: Medicare Other | Admitting: Gastroenterology

## 2015-10-22 ENCOUNTER — Encounter: Payer: Self-pay | Admitting: Dietician

## 2015-10-22 NOTE — Progress Notes (Signed)
Patient identified by [facility] nursing to be at risk for malnutrition due to dysphasia (can no longer swallow solids) and weight loss. Also noted to have very little finances and would need donated supplements.   On chart review, it is learned that this patient is in an incredibly poor state of health. Most notably he has CKD 5 (per PA, refused dialysis), CHF, Uncontrolled DM w/ many associated complication, CAD. On top of this, he smokes 2 packs daily (100 pack year hx) and is very noncompliant. He has frequently cancelled appointments and treatment sessions.   Given substantial list of co morbidities and poor health, had discussion with PA regarding foremost goal of care. Discussed option of focusing on high amounts of protein beverages for cancer treatment, though noted excessive intake of a nonrenal specific beverage make worsen his renal and heart failure. Conversely, if less fluid/protein is provided, cancer outcomes may be compromised.   PA stressed that because pt is not candidate for dialysis vs refuses dialysis, prolonging renal function is main objective and is his limiting factor.   In this case, advised that Suplena or a similar supplement would be best choice. However this formula is considerably more expensive and patient cannot afford these. Typically the 3 free cases of supplements given to pts are Ensure plus, due to low cost. Suplena would be notably more expensive, but would be best option for patient given his diseases. Discussed situation with Chief Strategy Officer at [facility]. He gave approval to order special renal supplement through pharmacy if patient would like.   PA notes high unlikelihood pt would adhere to renal diet.  Contacted Pt by phone.   Wt Readings from Last 10 Encounters:  10/17/15 147 lb (66.679 kg)  10/16/15 147 lb (66.679 kg)  10/13/15 146 lb (66.225 kg)  10/10/15 146 lb (66.225 kg)  10/10/15 146 lb 12.8 oz (66.588 kg)  10/08/15 153 lb (69.4 kg)  10/06/15 153 lb  (69.4 kg)  09/27/15 152 lb (68.947 kg)  09/18/15 152 lb (68.947 kg)  09/03/15 161 lb (73.029 kg)   Spoke with Pt's wife.   Dysphagia is patients biggest problem. Denies any nausea induced vomiting or diarrhea and takes laxative and anti nausea medication that keeps his other symptoms very tolerable.   She reports that pt has only been able to drink liquids. He has tried softer foods like puddings and jellos, but was unable to tolerate these.   Unfortunately, his diet consists of very renal/HF unfriendly items: Whole milk, buttermilk, tomato soup, Special K protein beverages, Boost, Chicken/Beef Broth, 7 up.   Discussed with wife (with pt in background) that because he is currently not going to be undergoing dialysis, his renal fx is the primary concern. Explained how certain electrolyte levels are not maintained properly in ckd, specifically na, phos, mg and K.   Given his current diet, his K and phos are likely much more elevated than prior levels: were elevated when last taken Last  K: 10/16/15 : 4.8, Last phos 09/03/15: 4.4.   Proposed Idea of this special renal supplement, suplena, as a way to provide nutrition through a much more renal friendly vehicle. Pt was agreeable to trying this. Wife confirmed they are meeting with the oncologist "Dr. Lowry Ram "([place]?) tomorrow. Will order one case and try to have it by their appointment tomorrow, though unlikely given their early appointment time.   RD asked about any prior renal education. Wife reports they were supposed to undergo education with the nephrologist, but this never  happened due to his admission to the hospital. Apparently he has missed very many appointments due to his hospital admissions. RD stated that he unfortunately would not be able to meet them, but would leave handout for renal diet.   Wife reports they check pt's BG 1x daily. It is in the 200s. They state that patient has not been taking oral hyperglycemic due to dropping  into the 50's went taking it. Wife believes this is due to poor intake. As a result, pt does not take his medication (glipzide). He may benefit from changing DM med away from secretagogues to decrease hypoglycemia risk.  Will mention to PA/MD  Quantity of intake is roughly 2 cans of soups, 1 7up and 1.5 special k protein drinks daily. She reports this is an improvement from where it was a short while ago.   RD to follow to help with appropriate diet options.   Left my contact info and handouts titled "Renal Pyramid" and "Full liquid diet" w/ secretary at [facility].   Burtis Junes RD, LDN Nutrition Pager: [phone] 10/22/2015 1:51 PM

## 2015-10-23 ENCOUNTER — Encounter: Payer: Self-pay | Admitting: Dietician

## 2015-10-23 ENCOUNTER — Encounter (HOSPITAL_COMMUNITY): Payer: Medicaid Other | Attending: Hematology & Oncology | Admitting: Hematology & Oncology

## 2015-10-23 ENCOUNTER — Encounter (HOSPITAL_COMMUNITY): Payer: Medicaid Other

## 2015-10-23 ENCOUNTER — Encounter (HOSPITAL_COMMUNITY): Payer: Self-pay | Admitting: Hematology & Oncology

## 2015-10-23 ENCOUNTER — Other Ambulatory Visit (HOSPITAL_COMMUNITY): Payer: Self-pay

## 2015-10-23 VITALS — BP 149/63 | HR 80 | Temp 98.3°F | Resp 18 | Wt 146.6 lb

## 2015-10-23 DIAGNOSIS — C3491 Malignant neoplasm of unspecified part of right bronchus or lung: Secondary | ICD-10-CM

## 2015-10-23 DIAGNOSIS — D500 Iron deficiency anemia secondary to blood loss (chronic): Secondary | ICD-10-CM | POA: Insufficient documentation

## 2015-10-23 DIAGNOSIS — Z66 Do not resuscitate: Secondary | ICD-10-CM

## 2015-10-23 DIAGNOSIS — C3401 Malignant neoplasm of right main bronchus: Secondary | ICD-10-CM | POA: Diagnosis not present

## 2015-10-23 DIAGNOSIS — Q2733 Arteriovenous malformation of digestive system vessel: Secondary | ICD-10-CM | POA: Diagnosis not present

## 2015-10-23 DIAGNOSIS — N185 Chronic kidney disease, stage 5: Secondary | ICD-10-CM

## 2015-10-23 DIAGNOSIS — L8990 Pressure ulcer of unspecified site, unspecified stage: Secondary | ICD-10-CM

## 2015-10-23 LAB — CBC WITH DIFFERENTIAL/PLATELET
Basophils Absolute: 0 10*3/uL (ref 0.0–0.1)
Basophils Relative: 0 %
Eosinophils Absolute: 0.2 10*3/uL (ref 0.0–0.7)
Eosinophils Relative: 3 %
HEMATOCRIT: 31 % — AB (ref 39.0–52.0)
HEMOGLOBIN: 10 g/dL — AB (ref 13.0–17.0)
LYMPHS ABS: 0.6 10*3/uL — AB (ref 0.7–4.0)
Lymphocytes Relative: 9 %
MCH: 30.4 pg (ref 26.0–34.0)
MCHC: 32.3 g/dL (ref 30.0–36.0)
MCV: 94.2 fL (ref 78.0–100.0)
MONO ABS: 0.5 10*3/uL (ref 0.1–1.0)
MONOS PCT: 6 %
NEUTROS ABS: 5.8 10*3/uL (ref 1.7–7.7)
NEUTROS PCT: 82 %
Platelets: 112 10*3/uL — ABNORMAL LOW (ref 150–400)
RBC: 3.29 MIL/uL — ABNORMAL LOW (ref 4.22–5.81)
RDW: 15.4 % (ref 11.5–15.5)
WBC: 7 10*3/uL (ref 4.0–10.5)

## 2015-10-23 LAB — COMPREHENSIVE METABOLIC PANEL
ALK PHOS: 127 U/L — AB (ref 38–126)
ALT: 17 U/L (ref 17–63)
ANION GAP: 7 (ref 5–15)
AST: 18 U/L (ref 15–41)
Albumin: 2.9 g/dL — ABNORMAL LOW (ref 3.5–5.0)
BILIRUBIN TOTAL: 0.6 mg/dL (ref 0.3–1.2)
BUN: 88 mg/dL — ABNORMAL HIGH (ref 6–20)
CALCIUM: 8.4 mg/dL — AB (ref 8.9–10.3)
CO2: 26 mmol/L (ref 22–32)
Chloride: 107 mmol/L (ref 101–111)
Creatinine, Ser: 2.88 mg/dL — ABNORMAL HIGH (ref 0.61–1.24)
GFR, EST AFRICAN AMERICAN: 25 mL/min — AB (ref >60)
GFR, EST NON AFRICAN AMERICAN: 21 mL/min — AB (ref >60)
GLUCOSE: 208 mg/dL — AB (ref 65–99)
Potassium: 5.7 mmol/L — ABNORMAL HIGH (ref 3.5–5.1)
Sodium: 140 mmol/L (ref 135–145)
TOTAL PROTEIN: 5.9 g/dL — AB (ref 6.5–8.1)

## 2015-10-23 MED ORDER — SODIUM POLYSTYRENE SULFONATE PO POWD: Freq: Once | ORAL | Status: DC

## 2015-10-23 NOTE — Progress Notes (Signed)
[name], [place] [address]  No diagnosis found.  REASON FOR VISIT:    Small cell lung cancer ([name])   09/26/2015 Initial Diagnosis Small cell lung cancer ([name])   09/26/2015 Imaging CT CAP- Extensive malignancy in the thorax. Large right hilar/mediastinal masslike soft tissue density causing encasement and narrowing of the right mainstem bronchus and bronchus intermedius. Discrete mass difficult separate from the extensive multifo..   09/26/2015 - 09/30/2015 Hospital Admission Epigastric pain   09/29/2015 Procedure Bronchoscopy by Dr. Luan Pulling   09/29/2015 Pathology Results Bronchial brushing and washing- BENIGN REACTIVE/REPARATIVE CHANGES.   10/13/2015 Procedure VIDEO BRONCHOSCOPY WITH ENDOBRONCHIAL ULTRASOUND and MEDIASTINOSCOPY by Dr. Roxan Hockey   10/14/2015 Pathology Results 1. Lymph node, biopsy, 2R - METASTATIC SMALL CELL CARCINOMA. 2. Lymph node, biopsy, cervical - ONE BENIGN LYMPH NODE. 3. Lymph node, biopsy, 2R #2 - METASTATIC SMALL CELL CARCINOMA.   10/16/2015 -  Chemotherapy Carboplatin/Etoposide (dose reduced based upon renal function).   10/16/2015 Code Status DNR   History of Iron Deficiency   INTERVAL HISTORY: [name] 65 y.o. male returns for followup of Extensive Stage SCLC. He received Cycle #1 of Carboplatin/Etoposide last week. Unfortunately he was not willing to come in on Wednesday and received only D1,D2 of therapy. 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   [name] is accompanied by his wife. He was admitted to the hospital on Sunday, 3/12. He was anemic, with acute on chronic renal failure.   [name] was here with his wife today.   His breathing has gotten better but he still uses his oxygen tank.He notes however that he can go without it. His breathing is "not as difficult." His wife said that he can go outside and sit for a while but then he has to go inside and put  his oxygen back on.   He has only been eating liquid food. He said that everything tastes bad even bread. He has not eaten any meat recently because he has a hard time swallowing. He does drink Boost and Ensure.   He sleeps in his chair because he has a hard time lying down. His wife said that he has a sore place on his left hip and that his butt has been hurting him. She said that he does rotates a lot in his chair because it hurts him sitting down.   He was going to start dialysis but they note that because of his cancer this will most likely not occur.   He denies any mouth sores, vomiting or diarrhea.   Past Medical History  Diagnosis Date  . Diverticulosis 03/04/10  . Peripheral vascular disease, unspecified ([place])   . Undiagnosed cardiac murmurs   . Other symptoms involving cardiovascular system   . HLD (hyperlipidemia)   . HTN (hypertension)   . Type II or unspecified type diabetes mellitus without mention of complication, not stated as uncontrolled   . Ulcer   . Carotid artery occlusion   . Umbilical hernia now    has not been repaired  . Blood transfusion   . AVM (arteriovenous malformation) of colon with hemorrhage   . Gastritis   . Internal hemorrhoids   . Right carotid bruit   . Shortness of breath     noted /w low Hgb  . GERD (gastroesophageal reflux disease)   . Arthritis     back  . Anemia  8/3,4,5- blood transfusion- [facility], colonoscopy- done & found 2 areas of bleeding   . Tobacco use disorder   . Anxiety   . CHF (congestive heart failure) ([place])     3 yrs ago  . Irregular heart beat   . Iron deficiency anemia due to chronic blood loss 10/07/2010  . Pneumonia April 2017  . Chronic kidney disease     renal failure /w osteomyelitis- 2010  . Chronic renal disease, stage 5, glomerular filtration rate less than or equal to 15 mL/min/1.73 square meter (HCC) 10/07/2010  . Difficulty swallowing solids   . Lung mass 09/26/2015  . 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[place]); Diabetic peripheral neuropathy ([place]); Iron deficiency anemia due to chronic blood loss; Noncompliance; Occlusion and stenosis of carotid artery without mention of cerebral infarction; GI bleeding; Hyponatremia; Acute renal failure ([place]); Aftercare following surgery of the circulatory system, NEC; AVM (arteriovenous malformation) of colon with hemorrhage; GERD (gastroesophageal reflux disease); Anxiety; CHF (congestive heart failure) (HCC); BPH (benign prostatic hyperplasia); Nausea without vomiting; Abdominal bloating; Dyspepsia; Dysphagia; Anemia; Constipation; Acute renal failure superimposed on stage 4 chronic kidney disease ([place]); Weakness; Mass of oral cavity; Left arm swelling; Hyperkalemia; Absolute anemia; Pneumonia; Small cell lung cancer ([place]); and DNR (do not resuscitate) on his problem list.     is allergic to zocor; asa; doxycycline; hctz; iodine; and sulfa antibiotics.  [name] does not currently have medications on file.  Past Surgical History  Procedure Laterality Date  . Colonoscopy  03/04/2010    Dr. Princella Pellegrini AMV without mention of ablation, scattered diverticula, internal hemorrhoids  . Bilateral common superficial femoral and profudus artery endarterectomies      2003    Dr. Sherren Mocha Early  . Esophagogastroduodenoscopy  03/2000    Dr. Tharon Aquas gastritis, H.Pylori gastritis, ?treated  . Esophagogastroduodenoscopy  05/2006    Dr. Marcello Fennel recurrent GI bleed. antral gastritis, single gastric AVM s/p APC, CLO results?  . Colonoscopy  11/2004    Dr. Barkley Bruns  . 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 Right  transtibilial amputation  06/2008  . 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 Both of these are ablated with argon plasma coagulator./ Small external hemorrhoids  . Esophagogastroduodenoscopy  01/21/2012    SLF: MILD TO MAODERATE Gastritis/ SLOW GIB LIKEY DUE TO PT BEING ON ASA ND SUBSEQUENT BLOOD/ LOSS FROM AVMS IN STOMACH AND COLON AS WELL AS GASTRITIS  . Enteroscopy  01/21/2012    Procedure: ENTEROSCOPY;  Surgeon: Danie Binder, MD;  [facility];  Service: Endoscopy;;  . Below knee leg amputation      2010, wears prosthesis   . Endarterectomy  02/09/2012    Procedure: ENDARTERECTOMY CAROTID;  Surgeon: Rosetta Posner, MD;  Location: [facility];  Service: Vascular;  Laterality: Left;  . Carotid endarterectomy Left 02-09-12  . Esophagogastroduodenoscopy N/A 09/03/2013    Mild chronic gastritis. benign gastric polyps  . Givens capsule study N/A 09/03/2013    Dr. Oneida Alar: small bowel and colonic AVMs. no active bleeding  . Esophagogastroduodenoscopy N/A 08/19/2015    Dr. Oneida Alar: 2 large gastric AVMs in fundus, 1  actively bleeding s/p APC and clip placement, empiric Savary dilation  . Savory dilation N/A 08/19/2015    Procedure: SAVORY DILATION;  Surgeon: Danie Binder, MD;  [facility];  Service: Endoscopy;  Laterality: N/A;  . Flexible bronchoscopy N/A 09/29/2015    Procedure: FLEXIBLE BRONCHOSCOPY;  Surgeon: Sinda Du, MD;  [facility];  Service: Cardiopulmonary;  Laterality: N/A;  . Bronchial brushings  09/29/2015    Procedure: BRONCHIAL BRUSHINGS;  Surgeon: Sinda Du, MD;  [facility];  Service: Cardiopulmonary;;  Tracheal and left lung  . Bronchial washings  09/29/2015    Procedure: BRONCHIAL WASHINGS;  Surgeon: Sinda Du, MD;  [facility];  Service: Cardiopulmonary;;  Bilatersl washings  . Vascular surgery      rt bka and rt 1st toe amputation  . Video  bronchoscopy with endobronchial ultrasound N/A 10/13/2015    Procedure: VIDEO BRONCHOSCOPY WITH ENDOBRONCHIAL ULTRASOUND;  Surgeon: Melrose Nakayama, MD;  Location: [facility];  Service: Thoracic;  Laterality: N/A;  . Mediastinoscopy N/A 10/13/2015    Procedure: MEDIASTINOSCOPY;  Surgeon: Melrose Nakayama, MD;  Location: [place];  Service: Thoracic;  Laterality: N/A;   REVIEW OF SYSTEMS: Denies any headaches, dizziness, double vision, fevers, chills, night sweats, nausea, vomiting, diarrhea, constipation, chest pain, heart palpitations, shortness of breath, blood in stool, black tarry stool, urinary pain, urinary burning, urinary frequency, hematuria. Positive for fatigue. Tired and has no energy. Positive for appetite loss.     Taste loss; Everything tastes bad.  Positive for difficulty swallowing Positive for soreness Soreness on left hip and buttocks.   14 point review of systems was performed and is negative except as detailed under history of present illness and above  PHYSICAL EXAMINATION  ECOG PERFORMANCE STATUS: 0 - Asymptomatic  Filed Vitals:   10/23/15 0800  BP: 149/63  Pulse: 80  Temp: 98.3 F (36.8 C)  Resp: 18    GENERAL:alert, no distress, well nourished, well developed, comfortable, cooperative, smiling and accompanied by his wife. SKIN: stage I decubitus ulcer buttock area approximately 6  To 7 cm in largest diameter HEAD: Normocephalic, No masses, lesions, tenderness or abnormalities MOUTH: 1/2 cm growth noted in the posterior aspect of the right tonsillar pillar EYES: normal, PERRLA, EOMI, Conjunctiva are pink and non-injected EARS: External ears normal OROPHARYNX:lips, buccal mucosa, and tongue normal and mucous membranes are moist  NECK: supple, thyroid normal size, non-tender, without nodularity, no stridor, non-tender, trachea midline LYMPH:  no palpable lymphadenopathy BREAST:not examined LUNGS: Decreased BS RL, L with occasional wheeze HEART: regular  rate & rhythm, no murmurs, no gallops, S1 normal and S2 normal ABDOMEN:abdomen soft, non-tender, normal bowel sounds and no masses or organomegaly BACK: Back symmetric, no curvature., No CVA tenderness EXTREMITIES:less then 2 second capillary refill, no joint deformities, effusion, or inflammation, no edema, no skin discoloration, Positive findings:  Right BLK amputation.   NEURO: alert & oriented x 3 with fluent speech, no focal motor/sensory deficits, gait normal   LABORATORY DATA: I have reviewed the data as listed.  Results for KENTAVIUS, DETTORE (MRN [id number]) as of 10/23/2015 09:51  Ref. Range 10/23/2015 08:00  Sodium Latest Ref Range: 135-145 mmol/L 140  Potassium Latest Ref Range: 3.5-5.1 mmol/L 5.7 (H)  Chloride Latest Ref Range: 101-111 mmol/L 107  CO2 Latest Ref Range: 22-32 mmol/L 26  BUN Latest Ref Range: 6-20 mg/dL 88 (H)  Creatinine Latest Ref Range: 0.61-1.24 mg/dL 2.88 (H)  Calcium Latest Ref Range: 8.9-10.3 mg/dL 8.4 (  L)  EGFR (Non-African Amer.) Latest Ref Range: >60 mL/min 21 (L)  EGFR (African American) Latest Ref Range: >60 mL/min 25 (L)  Glucose Latest Ref Range: 65-99 mg/dL 208 (H)  Anion gap Latest Ref Range: 5-15  7  Alkaline Phosphatase Latest Ref Range: 38-126 U/L 127 (H)  Albumin Latest Ref Range: 3.5-5.0 g/dL 2.9 (L)  AST Latest Ref Range: 15-41 U/L 18  ALT Latest Ref Range: 17-63 U/L 17  Total Protein Latest Ref Range: 6.5-8.1 g/dL 5.9 (L)  Total Bilirubin Latest Ref Range: 0.3-1.2 mg/dL 0.6  WBC Latest Ref Range: 4.0-10.5 K/uL 7.0  RBC Latest Ref Range: 4.22-5.81 MIL/uL 3.29 (L)  Hemoglobin Latest Ref Range: 13.0-17.0 g/dL 10.0 (L)  HCT Latest Ref Range: 39.0-52.0 % 31.0 (L)  MCV Latest Ref Range: 78.0-100.0 fL 94.2  MCH Latest Ref Range: 26.0-34.0 pg 30.4  MCHC Latest Ref Range: 30.0-36.0 g/dL 32.3  RDW Latest Ref Range: 11.5-15.5 % 15.4  Platelets Latest Ref Range: 150-400 K/uL 112 (L)  Neutrophils Latest Units: % 82  Lymphocytes Latest Units: % 9    Monocytes Relative Latest Units: % 6  Eosinophil Latest Units: % 3  Basophil Latest Units: % 0  NEUT# Latest Ref Range: 1.7-7.7 K/uL 5.8  Lymphocyte # Latest Ref Range: 0.7-4.0 K/uL 0.6 (L)  Monocyte # Latest Ref Range: 0.1-1.0 K/uL 0.5  Eosinophils Absolute Latest Ref Range: 0.0-0.7 K/uL 0.2  Basophils Absolute Latest Ref Range: 0.0-0.1 K/uL 0.0   RADIOLOGY RESULTS:  Study Result     CLINICAL DATA: Shortness of breath with productive cough, worse yesterday. Preoperative labs done yesterday for right lung biopsy on Monday. Smoker.  EXAM: CHEST 2 VIEW  COMPARISON: Chest 10/10/2015. CT chest 09/26/2015.  FINDINGS: 2.6 cm diameter nodule in the left upper lung. 2.1 cm nodule in the left mid lung. Right hilar mass lesion with postobstructive change in the right lung base. Features are similar to previous study. Emphysematous changes in the aerated portions of the lungs. Borderline heart size with normal pulmonary vascularity. No pneumothorax. Degenerative changes in the spine.  IMPRESSION: Right hilar mass lesion with postobstructive change in the right lung base. Nodules in the left upper lung and left mid lung. No change since prior study. Findings likely malignant.   Electronically Signed  By: Lucienne Capers M.D.  On: 10/11/2015 05:36   Study Result     CLINICAL DATA: Preoperative examination prior to left lung biopsy  EXAM: CHEST 2 VIEW  COMPARISON: CT scan of the chest of September 26, 2015  FINDINGS: There is volume loss on the right which is not entirely new but more conspicuous than on the previous study. This is in part due to elevation of the right hemidiaphragm. There is right basilar atelectasis or pneumonia. On the left the known upper lobe mass is visible overlying the posterior aspect of the second rib. An additional subtle masses visible inferior to this. There is no definite pleural effusion though a small effusion on the right  is not excluded. There is soft tissue fullness in the right paratracheal and right suprahilar region consistent with lymphadenopathy. The heart is not enlarged. The pulmonary vascularity is not engorged. There is mild multilevel degenerative disc disease of the thoracic spine.  IMPRESSION: 1. Increased volume loss on the right likely due to postobstructive atelectasis as well as elevated right hemidiaphragm. 2. Persistent bulky right paratracheal lymphadenopathy. Two upper lobe left lung masses remain visible. 3. No evidence of CHF.   Electronically Signed  By: David Martinique M.D.  On: 10/10/2015 14:57   Study Result     CLINICAL DATA: Shortness of breath. CT abdomen today demonstrating right lower lobe pneumonia and right hilar adenopathy.  EXAM: CT CHEST WITHOUT CONTRAST  TECHNIQUE: Multidetector CT imaging of the chest was performed following the standard protocol without IV contrast.  COMPARISON: Included lung bases from CT abdomen earlier today.  FINDINGS: Mediastinum/Lymph Nodes: Extensive mediastinal adenopathy and masslike opacity in the right hilar and right mediastinum, with marked paratracheal thickening and circumferential soft tissue density posterior lateral to the mid distal trachea, encasing the trachea at the carina, and causing luminal narrowing of the right mainstem bronchus. Exact measurements are difficult due to the large nature, however measures at least 9.5 cm in greatest dimension. Individual lymph nodes versus pulmonary mass cannot be discretely identified given lack contrast in the conglomerate soft tissue nature. Separate enlarged AP window lymph node measures 1.5 cm short axis with additional superior mediastinal, prevascular, and AP window nodes. There is abnormal left axillary adenopathy. Multiple small right axillary lymph nodes also seen. Mild cardiomegaly. Moderate coronary artery calcifications. Atherosclerosis of  the thoracic aorta which is normal in caliber. Physiologic pericardial fluid.  Lungs/Pleura: Abnormal right hilar soft tissue density with spiculation, a discrete right lung nodule or mass is not seen due to the conglomerate nature and lack of intravenous contrast. There luminal narrowing and near complete occlusion of the right mainstem bronchus secondary to bronchial encasement. Mass encases the bronchus intermedius with marked narrowing of the middle and lower lobe bronchi. Volume loss in the right lung with small right pleural effusion. Ill-defined and nodular right lower lobe opaciteis. 2 spiculated nodules in the left upper lobe. More superior nodule measures 2.4 x 1.8 x 2.2 cm with peripheral cavitation. The more inferior nodule measures 2.1 x 1.6 x 2.0 cm. Both nodules have spiculated borders.  Upper abdomen: Evaluated earlier this day at with CT abdomen/pelvis.  Musculoskeletal: No destructive lytic or discrete blastic osseous lesion. 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  IMPRESSION: 1. 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 Large right hilar/mediastinal masslike soft tissue density causing encasement and narrowing of the right mainstem bronchus and bronchus intermedius. Discrete mass difficult separate from the extensive multifocal mediastinal adenopathy. The right lower lobe opacities may be postobstructive atelectasis, spread of malignancy or pneumonia. Suspect bronchogenic malignancy, however primary lesion difficult to discern. 2. Two spiculated left upper lobe nodules, both approximately 2 cm, primary versus metastatic disease. These results were called by telephone at the time of interpretation on 09/26/2015 at 10:19 pm to Dr. Bethann Berkshire , who verbally acknowledged these results.   Electronically Signed  By: Rubye Oaks M.D.  On: 09/26/2015 22:20    Study Result     CLINICAL DATA: Abdominal pain  with decreased appetite following esophageal dilatation procedure  EXAM: CT ABDOMEN AND PELVIS WITHOUT CONTRAST  TECHNIQUE: Multidetector CT imaging of the abdomen and pelvis was performed following the standard protocol without IV contrast.  COMPARISON: None.  FINDINGS: Lung bases demonstrate evidence of right lower lobe pneumonia. Some fullness is also noted anterior to the right main pulmonary artery which likely represents lymphadenopathy. Dedicated CT of the chest is recommended.  The liver, gallbladder, spleen, adrenal glands and pancreas are all normal in their CT appearance. The kidneys are well visualized bilaterally and demonstrate renal vascular calcifications. No renal stones or obstructive changes are seen. The bladder is partially distended.  Significant calcifications of the seminal vesicles are noted bilaterally. The vas deferens  are also calcified. Aortoiliac calcifications are seen. A small umbilical hernia is noted containing fat. The appendix is within normal limits. No significant diverticular change is noted. Stomach demonstrates clips consistent with the prior endoscopy procedure.  IMPRESSION: Right lower lobe pneumonia as well as changes consistent with lymphadenopathy in the region of the right hilum and adjacent to the right pulmonary artery. CT of the chest is recommended for further evaluation.  Chronic changes in the abdomen and pelvis. No acute abnormality is seen.   Electronically Signed  By: Inez Catalina M.D.  On: 09/26/2015 20:29    PATHOLOGY:    ASSESSMENT AND PLAN:  Intestinal AVM's  Gastric AVM's Iron deficiency anemia secondary to chronic GI related blood loss CKD, Stage V EGD August 19, 2015 with gastric AVMs s/p APC therapy and clip placement LUE swelling, negative Ultrasound on 09/01/2015 Oropharyngeal lesion Extensive Stage SCLC  Decubitus Ulcer Stage I DNR status  Unfortunately he has extensive stage  SCLC, did well with his first cycle of therapy however. Seems to be breathing a bit more comfortably. He is a DNR. Both he and his wife understand that he is not curable, treatment is palliative. He has not had brain imaging and will address at follow-up if he is interested. He has been reluctant to be overly aggressive. Transportation is going to be an issue as well.   We will start with duoderm.   He will return for a follow up on 11/05/15.  All questions were answered. The patient knows to call the clinic with any problems, questions or concerns. We can certainly see the patient much sooner if necessary.  This document serves as a record of services personally performed by Ancil Linsey, MD. It was created on her behalf by Kandace Blitz, a trained medical scribe. The creation of this record is based on the scribe's personal observations and the provider's statements to them. This document has been checked and approved by the attending provider.  I have reviewed the above documentation for accuracy and completeness, and I agree with the above.  This note is electronically signed by:  Molli Hazard, MD  10/23/2015 10:12 AM

## 2015-10-23 NOTE — Patient Instructions (Signed)
[place] at [facility] Discharge Instructions  RECOMMENDATIONS MADE BY THE CONSULTANT AND ANY TEST RESULTS WILL BE SENT TO YOUR REFERRING PHYSICIAN.   Exam and discussion by Dr Whitney Muse today Return to see the doctor as scheduled  Please call the clinic if you have any questions or concerns     Thank you for choosing [place] at [facility] to provide your oncology and hematology care.  To afford each patient quality time with our provider, please arrive at least 15 minutes before your scheduled appointment time.   Beginning January 23rd 2017 lab work for the [facility] will be done in the  Main lab at [facility] on 1st floor. If you have a lab appointment with the [place] please come in thru the  Main Entrance and check in at the main information desk  You need to re-schedule your appointment should you arrive 10 or more minutes late.  We strive to give you quality time with our providers, and arriving late affects you and other patients whose appointments are after yours.  Also, if you no show three or more times for appointments you may be dismissed from the clinic at the providers discretion.     Again, thank you for choosing [facility].  Our hope is that these requests will decrease the amount of time that you wait before being seen by our physicians.       _____________________________________________________________  Should you have questions after your visit to [facility], please contact our office at [phone] between the hours of 8:30 a.m. and 4:30 p.m.  Voicemails left after 4:30 p.m. will not be returned until the following business day.  For prescription refill requests, have your pharmacy contact our office.         Resources For Cancer Patients and their Caregivers ? American Cancer Society: Can assist with transportation, wigs, general needs, runs Look Good Feel Better.         ([phone] 482-2128 ? Cancer Care: Provides financial assistance, online support groups, medication/co-pay assistance.  1-800-813-HOPE 631-164-8360) ? [place] Assists [place] cancer patients and their families through emotional , educational and financial support.  4108116328 ? [place] DSS Where to apply for food stamps, Medicaid and utility assistance. 947-058-8804 ? [facility]: Transportation to medical appointments. 206-436-4821 ? Social Security Administration: May apply for disability if have a Stage IV cancer. 740-863-2924 650-323-3401 ? [employer], Disability and Transit Services: Assists with nutrition, care and transit needs. 660-402-9121

## 2015-10-23 NOTE — Progress Notes (Signed)
Stage I pressure sore to rt sacral area with light exudate measuring 1.2 cm width x .7 cm height. Thin Duoderm applied and I instructed wife to change in 7 days or before if it gets soiled. Duoderm to be changed very 7 days. We will reassess sacral area @ next visit.

## 2015-10-23 NOTE — Progress Notes (Signed)
Brief follow up with patient and wife. Briefly went over the renal diet and talked about avoiding high amounts of phos/k. Unfortunately, his supplement that he is supposed to use as a replacement for his milk/tomato soup is not here at this time. However the overall quantity of these items does not seem too great. Stressed that RD does not want pt to feel like he is restricted and that he should eat, just want to be more mindful about choices.   Pt reports that his dysphagia has improved. Hopefully he will be able to tolerate soft foods in near future which will add many more potential food choices.   Today, wife also states that pt has radical taste changes. This reason he likes the tomato soup is because he can taste it. Other foods simply "taste bad".  This was not a symptom that was mentioned per discussion yesterday and did not have handout to provide.   Gave some recommendations for improving sense of taste such as providing good oral care, chewing on gum/sucking on mints, using a mouth rinse, buying/trying different spices and marinating many of his items.   Finally, addressed his glycemic control. Reccommended reaching out to their endocrinologist to see if he could be placed on different oral hyperglycemic or a reduced dose. They report BG readings of >200.   Will f/u in in a couple weeks when pt returns for chemo.   Burtis Junes RD, LDN Nutrition Pager: [phone] 10/23/2015 8:38 AM

## 2015-10-25 ENCOUNTER — Encounter (HOSPITAL_COMMUNITY): Payer: Self-pay | Admitting: Oncology

## 2015-10-29 ENCOUNTER — Telehealth: Payer: Self-pay | Admitting: Nurse Practitioner

## 2015-10-29 NOTE — Telephone Encounter (Signed)
Does want to use [facility] for diabetic supplies

## 2015-11-04 ENCOUNTER — Encounter (HOSPITAL_COMMUNITY): Payer: Self-pay | Admitting: Oncology

## 2015-11-04 DIAGNOSIS — L89159 Pressure ulcer of sacral region, unspecified stage: Secondary | ICD-10-CM

## 2015-11-04 HISTORY — DX: Pressure ulcer of sacral region, unspecified stage: L89.159

## 2015-11-04 NOTE — Assessment & Plan Note (Addendum)
Stage I pressure sore of right sacral area with light exudate measuring 1.2 x 0.7 cm.    Treatment: Thin Duoderm applied with instructions to change every 7 days or before if it gets soiled.   Current status: Resolved

## 2015-11-04 NOTE — Progress Notes (Addendum)
[name], [place] [address]  Small cell lung cancer, right [facility]) - Plan: CT Abdomen Pelvis W Contrast, CT Chest W Contrast  Iron deficiency anemia due to chronic blood loss - Plan: Iron and TIBC, Ferritin, Iron and TIBC, Ferritin  DNR (do not resuscitate)  Sacral pressure sore, stage I  Leg edema, left - Plan: US Venous Img Lower Unilateral Left  Hyperkalemia - Plan: sodium polystyrene (KAYEXALATE) powder, DISCONTINUED: sodium polystyrene (KAYEXALATE) 15 GM/60ML suspension 30 g  CURRENT THERAPY: Carboplatin/Etoposide (dose-reduced based upon renal function) and IV iron replacement in the past and we will continue to do so.  [name] returns for followup of Extensive stage small cell lung cancer (based upon bilateral pulmonary involvement radiographically without biopsy of left lung nodules) beginning Carboplatin/Etoposide beginning on 10/16/2015. 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 AND Stage I pressure sore of right sacral area with light exudate measuring 1.2 x 0.7 cm.      Small cell lung cancer [name]   09/26/2015 Initial Diagnosis Small cell lung cancer [name]   09/26/2015 Imaging CT CAP- Extensive malignancy in the thorax. Large right hilar/mediastinal masslike soft tissue density causing encasement and narrowing of the right mainstem bronchus and bronchus intermedius. Discrete mass difficult separate from the extensive multifo..   09/26/2015 - 09/30/2015 Hospital Admission Epigastric pain   09/29/2015 Procedure Bronchoscopy by Dr. Luan Pulling   09/29/2015 Pathology Results Bronchial brushing and washing- BENIGN REACTIVE/REPARATIVE CHANGES.   10/13/2015 Procedure VIDEO BRONCHOSCOPY WITH ENDOBRONCHIAL ULTRASOUND and MEDIASTINOSCOPY by Dr. Roxan Hockey   10/14/2015 Pathology Results 1. Lymph node, biopsy, 2R - METASTATIC SMALL CELL CARCINOMA. 2. Lymph node, biopsy,  cervical - ONE BENIGN LYMPH NODE. 3. Lymph node, biopsy, 2R #2 - METASTATIC SMALL CELL CARCINOMA.   10/16/2015 -  Chemotherapy Carboplatin/Etoposide (dose reduced based upon renal function).   10/16/2015 Code Status DNR   10/16/2015 Imaging [place] L upper extremity- No evidence of deep venous thrombosis.   11/05/2015 Imaging [place] of L LE- No evidence of deep venous thrombosis.    I personally reviewed and went over laboratory results with the patient.  The results are noted within this dictation. Labs are relatively stable. Hyperkalemia is noted at 5.2. This is addressed in the clinic today. Renal function stable.  Weight is down 6 pounds to 140 pounds today compared to 146 on 10/10/2015.  Nutrition assessment with him and made recommendations.  He reports his breathing has improved since treatment. He tolerated his first cycle of chemotherapy well. He denies any nausea or vomiting.  He does note a left lower leg edema.  He is wearing a compression sock.  He notes ankle tenderness to palpation, but none to deep palpation of calf or popliteal region.  No palpation cords.  Given his disease, it is prudent to evaluate for a DVT.  I discussed future imaging including restaging tests and imaging of brain.  He is not interested in brain imaging.  He reports that he is healed and the Reita Cliche has cured his disease.   The other day he was working on his truck, removing an intake manifold.  He injured his right medial lower arm with an abrasion.    Review of Systems  Constitutional: Positive for weight loss (evaluated by RD). Negative for fever and chills.  HENT: Negative.  Negative for tinnitus.   Eyes: Negative.  Negative for blurred vision and double  vision.  Respiratory: Negative.  Negative for hemoptysis.   Cardiovascular: Negative.   Gastrointestinal: Negative.  Negative for nausea, vomiting, diarrhea and constipation.  Genitourinary: Negative.   Musculoskeletal: Negative.   Skin: Negative.     Neurological: Negative.  Negative for dizziness, sensory change, speech change, focal weakness, seizures, loss of consciousness, weakness and headaches.  Endo/Heme/Allergies: Negative.   Psychiatric/Behavioral: Negative.     Past Medical History  Diagnosis Date  . Diverticulosis 03/04/10  . Peripheral vascular disease, unspecified ([place])   . Undiagnosed cardiac murmurs   . Other symptoms involving cardiovascular system   . HLD (hyperlipidemia)   . HTN (hypertension)   . Type II or unspecified type diabetes mellitus without mention of complication, not stated as uncontrolled   . Ulcer   . Carotid artery occlusion   . Umbilical hernia now    has not been repaired  . Blood transfusion   . AVM (arteriovenous malformation) of colon with hemorrhage   . Gastritis   . Internal hemorrhoids   . Right carotid bruit   . Shortness of breath     noted /w low Hgb  . GERD (gastroesophageal reflux disease)   . Arthritis     back  . Anemia     8/3,4,5- blood transfusion- [facility], colonoscopy- done & found 2 areas of bleeding   . Tobacco use disorder   . Anxiety   . CHF (congestive heart failure) ([place])     3 yrs ago  . Irregular heart beat   . Iron deficiency anemia due to chronic blood loss 10/07/2010  . Pneumonia April 2017  . Chronic kidney disease     renal failure /w osteomyelitis- 2010  . Chronic renal disease, stage 5, glomerular filtration rate less than or equal to 15 mL/min/1.73 square meter (HCC) 10/07/2010  . Difficulty swallowing solids   . Lung mass 09/26/2015  . DNR (do not resuscitate) 10/16/2015    10/16/2015  . Sacral pressure sore 11/04/2015    Past Surgical History  Procedure Laterality Date  . Colonoscopy  03/04/2010    Dr. Princella Pellegrini AMV without mention of ablation, scattered diverticula, internal hemorrhoids  . Bilateral common superficial femoral and profudus artery endarterectomies      2003    Dr. Sherren Mocha Early  . Esophagogastroduodenoscopy  03/2000    Dr.  Tharon Aquas gastritis, H.Pylori gastritis, ?treated  . Esophagogastroduodenoscopy  05/2006    Dr. Marcello Fennel recurrent GI bleed. antral gastritis, single gastric AVM s/p APC, CLO results?  . Colonoscopy  11/2004    Dr. Barkley Bruns  . 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 Both of these are ablated with argon plasma coagulator./ Small external hemorrhoids  . Esophagogastroduodenoscopy  01/21/2012    SLF: MILD TO MAODERATE Gastritis/ SLOW GIB LIKEY DUE TO PT BEING ON ASA ND SUBSEQUENT BLOOD/ LOSS FROM AVMS IN STOMACH AND COLON AS WELL AS GASTRITIS  . Enteroscopy  01/21/2012    Procedure: ENTEROSCOPY;  Surgeon: Danie Binder, MD;  [facility];  Service: Endoscopy;;  . Below knee leg amputation      2010, wears prosthesis   . Endarterectomy  02/09/2012    Procedure: ENDARTERECTOMY CAROTID;  Surgeon: Rosetta Posner, MD;  Location: [facility];  Service: Vascular;  Laterality: Left;  . Carotid endarterectomy Left 02-09-12  . Esophagogastroduodenoscopy N/A 09/03/2013  Mild chronic gastritis. benign gastric polyps  . Givens capsule study N/A 09/03/2013    Dr. Oneida Alar: small bowel and colonic AVMs. no active bleeding  . Esophagogastroduodenoscopy N/A 08/19/2015    Dr. Oneida Alar: 2 large gastric AVMs in fundus, 1 actively bleeding s/p APC and clip placement, empiric Savary dilation  . Savory dilation N/A 08/19/2015    Procedure: SAVORY DILATION;  Surgeon: Danie Binder, MD;  [facility];  Service: Endoscopy;  Laterality: N/A;  . Flexible bronchoscopy N/A 09/29/2015    Procedure: FLEXIBLE BRONCHOSCOPY;  Surgeon: Sinda Du, MD;  [facility];  Service: Cardiopulmonary;  Laterality: N/A;  . Bronchial brushings  09/29/2015    Procedure: BRONCHIAL BRUSHINGS;  Surgeon: Sinda Du, MD;  [facility];  Service:  Cardiopulmonary;;  Tracheal and left lung  . Bronchial washings  09/29/2015    Procedure: BRONCHIAL WASHINGS;  Surgeon: Sinda Du, MD;  [facility];  Service: Cardiopulmonary;;  Bilatersl washings  . Vascular surgery      rt bka and rt 1st toe amputation  . Video bronchoscopy with endobronchial ultrasound N/A 10/13/2015    Procedure: VIDEO BRONCHOSCOPY WITH ENDOBRONCHIAL ULTRASOUND;  Surgeon: Melrose Nakayama, MD;  Location: [facility];  Service: Thoracic;  Laterality: N/A;  . Mediastinoscopy N/A 10/13/2015    Procedure: MEDIASTINOSCOPY;  Surgeon: Melrose Nakayama, MD;  [facility];  Service: Thoracic;  Laterality: N/A;    Family History  Problem Relation Age of Onset  . Colon cancer Brother 30    deceased  . Hyperlipidemia Brother   . Hypertension Brother   . Lung cancer Sister     deceased, three primary cancers: lung, esophageal, lymphoma.   . Diabetes Sister   . Hypertension Sister   . Cancer Father     gallbladder  . Hyperlipidemia Father   . Hypertension Father   . Cancer Brother     unknown primary  . Cancer Sister     unknown primary  . Hyperlipidemia Sister   . Diabetes Mother   . Hypertension Mother     Social History   Social History  . Marital Status: Married    Spouse Name: N/A  . Number of Children: N/A  . Years of Education: N/A   Social History Main Topics  . Smoking status: Current Every Day Smoker -- 2.00 packs/day for 50 years    Types: Cigarettes  . Smokeless tobacco: Former Systems developer    Quit date: 02/08/2012     Comment: Smokes about 2 packs of tiny skinny cigarettes  . Alcohol Use: No  . Drug Use: No  . Sexual Activity: Not Asked   Other Topics Concern  . None   Social History Narrative     PHYSICAL EXAMINATION  ECOG PERFORMANCE STATUS: 2 - Symptomatic, <50% confined to bed  Filed Vitals:   11/05/15 1112  BP: 126/60  Pulse: 74  Temp: 97.8 F (36.6 C)  Resp: 20    GENERAL:alert, no distress, comfortable,  cooperative, smiling and chronically ill appearing, in a chemo-recliner, accompanied by his wife. SKIN: skin color, texture, turgor are normal, no rashes or significant lesions, right medial lower arm abrasion from trauma. HEAD: Normocephalic, No masses, lesions, tenderness or abnormalities EYES: normal, EOMI, Conjunctiva are pink and non-injected EARS: External ears normal OROPHARYNX:lips, buccal mucosa, and tongue normal and mucous membranes are moist  NECK: supple, trachea midline LYMPH:  no palpable lymphadenopathy BREAST:not examined LUNGS: clear to auscultation  HEART: regular rate & rhythm  ABDOMEN:abdomen soft and normal bowel sounds BACK: Back symmetric, no curvature. EXTREMITIES:less then 2 second capillary refill, no skin discoloration, no cyanosis, positive findings:  edema left lower extremity edema, 1+ pitting, compression stocking in place. No calf or popliteal tenderness to palpation, ankle tenderness to palpation. No palpable cords.    NEURO: alert & oriented x 3 with fluent speech, no focal motor/sensory deficits   LABORATORY DATA: CBC    Component Value Date/Time   WBC 13.2* 11/05/2015 1050   RBC 3.14* 11/05/2015 1050   RBC 3.51* 09/14/2013 1430   HGB 9.6* 11/05/2015 1050   HGB 8.0* 09/13/2013 0840   HCT 28.6* 11/05/2015 1050   PLT 199 11/05/2015 1050   MCV 91.1 11/05/2015 1050   MCH 30.6 11/05/2015 1050   MCHC 33.6 11/05/2015 1050   RDW 15.6* 11/05/2015 1050   LYMPHSABS 0.9 11/05/2015 1050   MONOABS 0.8 11/05/2015 1050   EOSABS 0.1 11/05/2015 1050   BASOSABS 0.0 11/05/2015 1050      Chemistry      Component Value Date/Time   NA 132* 11/05/2015 1050   NA 140 05/06/2015 1512   K 5.2* 11/05/2015 1050   CL 99* 11/05/2015 1050   CO2 27 11/05/2015 1050   BUN 45* 11/05/2015 1050   BUN 34* 05/06/2015 1512   CREATININE 3.30* 11/05/2015 1050   CREATININE 1.59* 12/06/2012 0852      Component Value Date/Time   CALCIUM 8.7* 11/05/2015 1050   CALCIUM 8.4*  09/03/2015 1230   ALKPHOS 126 11/05/2015 1050   AST 15 11/05/2015 1050   ALT 13* 11/05/2015 1050   BILITOT 0.5 11/05/2015 1050   BILITOT 0.2 05/06/2015 1512        PENDING LABS:   RADIOGRAPHIC STUDIES:  Dg Chest 2 View  10/11/2015  CLINICAL DATA:  Shortness of breath with productive cough, worse yesterday. Preoperative labs done yesterday for right lung biopsy on Monday. Smoker. EXAM: CHEST  2 VIEW COMPARISON:  Chest 10/10/2015.  CT chest 09/26/2015. FINDINGS: 2.6 cm diameter nodule in the left upper lung. 2.1 cm nodule in the left mid lung. Right hilar mass lesion with postobstructive change in the right lung base. Features are similar to previous study. Emphysematous changes in the aerated portions of the lungs. Borderline heart size with normal pulmonary vascularity. No pneumothorax. Degenerative changes in the spine. IMPRESSION: Right hilar mass lesion with postobstructive change in the right lung base. Nodules in the left upper lung and left mid lung. No change since prior study. Findings likely malignant. Electronically Signed   By: Lucienne Capers M.D.   On: 10/11/2015 05:36   Dg Chest 2 View  10/10/2015  CLINICAL DATA:  Preoperative examination prior to left lung biopsy EXAM: CHEST  2 VIEW COMPARISON:  CT scan of the chest of September 26, 2015 FINDINGS: There is volume loss on the right which is not entirely new but more conspicuous than on the previous study. This is in part due to elevation of the right hemidiaphragm. There is right basilar atelectasis or pneumonia. On the left the known upper lobe mass is visible overlying the posterior aspect of the second rib. An additional subtle masses visible inferior to this. There is no definite pleural effusion though a small effusion on the right is not excluded. There is soft tissue fullness in the right paratracheal and right suprahilar region consistent with lymphadenopathy. The heart is not enlarged. The pulmonary vascularity is not engorged.  There is mild multilevel degenerative disc disease of the thoracic  spine. IMPRESSION: 1. Increased volume loss on the right likely due to postobstructive atelectasis as well as elevated right hemidiaphragm. 2. Persistent bulky right paratracheal lymphadenopathy. Two upper lobe left lung masses remain visible. 3. No evidence of CHF. Electronically Signed   By: David  Martinique M.D.   On: 10/10/2015 14:57   US Venous Img Lower Unilateral Left  11/05/2015  CLINICAL DATA:  Left leg swelling EXAM: Left LOWER EXTREMITY VENOUS DOPPLER ULTRASOUND TECHNIQUE: Gray-scale sonography with graded compression, as well as color Doppler and duplex ultrasound were performed to evaluate the lower extremity deep venous systems from the level of the common femoral vein and including the common femoral, femoral, profunda femoral, popliteal and calf veins including the posterior tibial, peroneal and gastrocnemius veins when visible. The superficial great saphenous vein was also interrogated. Spectral Doppler was utilized to evaluate flow at rest and with distal augmentation maneuvers in the common femoral, femoral and popliteal veins. COMPARISON:  None. FINDINGS: Contralateral Common Femoral Vein: Respiratory phasicity is normal and symmetric with the symptomatic side. No evidence of thrombus. Normal compressibility. Common Femoral Vein: No evidence of thrombus. Normal compressibility, respiratory phasicity and response to augmentation. Saphenofemoral Junction: No evidence of thrombus. Normal compressibility and flow on color Doppler imaging. Profunda Femoral Vein: No evidence of thrombus. Normal compressibility and flow on color Doppler imaging. Femoral Vein: No evidence of thrombus. Normal compressibility, respiratory phasicity and response to augmentation. Popliteal Vein: No evidence of thrombus. Normal compressibility, respiratory phasicity and response to augmentation. Calf Veins: No evidence of thrombus. Normal compressibility and  flow on color Doppler imaging. Superficial Great Saphenous Vein: No evidence of thrombus. Normal compressibility and flow on color Doppler imaging. Venous Reflux:  None. Other Findings:  None. IMPRESSION: No evidence of deep venous thrombosis. Electronically Signed   By: Inez Catalina M.D.   On: 11/05/2015 15:41   US Venous Img Upper Uni Left  10/16/2015  CLINICAL DATA:  65 year old male with left upper extremity swelling EXAM: LEFT UPPER EXTREMITY VENOUS DOPPLER ULTRASOUND TECHNIQUE: Gray-scale sonography with graded compression, as well as color Doppler and duplex ultrasound were performed to evaluate the upper extremity deep venous system from the level of the subclavian vein and including the jugular, axillary, basilic, radial, ulnar and upper cephalic vein. Spectral Doppler was utilized to evaluate flow at rest and with distal augmentation maneuvers. COMPARISON:  Prior ultrasound 09/01/2015 FINDINGS: Contralateral Subclavian Vein: Respiratory phasicity is normal and symmetric with the symptomatic side. No evidence of thrombus. Normal compressibility. Internal Jugular Vein: No evidence of thrombus. Normal compressibility, respiratory phasicity and response to augmentation. Subclavian Vein: No evidence of thrombus. Normal compressibility, respiratory phasicity and response to augmentation. Axillary Vein: No evidence of thrombus. Normal compressibility, respiratory phasicity and response to augmentation. Cephalic Vein: No evidence of thrombus. Normal compressibility, respiratory phasicity and response to augmentation. Basilic Vein: No evidence of thrombus. Normal compressibility, respiratory phasicity and response to augmentation. Brachial Veins: No evidence of thrombus. Normal compressibility, respiratory phasicity and response to augmentation. Radial Veins: No evidence of thrombus. Normal compressibility, respiratory phasicity and response to augmentation. Ulnar Veins: No evidence of thrombus. Normal  compressibility, respiratory phasicity and response to augmentation. Venous Reflux:  None visualized. Other Findings: Edema noted in the superficial soft tissues of the left forearm IMPRESSION: No evidence of deep venous thrombosis. Electronically Signed   By: Jacqulynn Cadet M.D.   On: 10/16/2015 13:44     PATHOLOGY:    ASSESSMENT AND PLAN:  Small cell lung cancer (HCC) Extensive stage small  cell lung cancer status post VATS with endobronchial ultrasound and mediastinal endoscopy by Dr. Roxan Hockey on 10/13/2015 resulting in aforementioned diagnosis.  Began palliative Carboplatin/Etoposide on 10/16/2015.  Oncology history updated.  Patients with extended stage disease, the median survival is 8 to 13 months, and the five-year survival rate is 1 to 2 percent.  In most series, less than 5 percent of those with Extensive Stage SCLC survive beyond two years.  Prophylactic cranial irradiation decreases the incidence of symptomatic brain metastases in patients who have responded to systemic chemotherapy, although its impact on overall survival is uncertain. For patients with a response to systemic chemotherapy, thoracic radiation may be of benefit in increasing the percentage of long-term survivors.   DNR   First cycle of chemotherapy was given peripherally without any difficulties.  Pre-treatment labs today: CBC diff, CMET, Magnesium.  Labs meet treatment parameters today.  Hyperkalemia noted.  Order placed for 30 g of Kayexalate today in the clinic.  Given his recurrent hyperkalemia, I have ordered Kayexalate 30 grams weekly and this is escribed to his pharmacy.  Left LE edema, new.  Order placed for STAT left LE [place] to evaluate for DVT.  He needs imaging of brain and we discussed this in detail today.  I reviewed the nature history of small cell lung cancer and its predilection for intracranial metastases.  He is educated that if present, he would be a candidate for XRT.   He has been resistant  to aggressive intervention thus far.  He is asymptomatic.  He reports that he believes in [place] and he is healed.  "By his stripes, I am healed."  He declines imaging of brain.  He also has a strong history of claustrophobia.  He is due for restaging scans following cycle 3 of chemotherapy. Clinically he is improved. Orders place for CT chest, abdomen, and pelvis with contrast following cycle #3.  Return in 21 days for follow-up and cycle #3 of treatment.  Addendum: Left lower extremity US is negative for DVT.  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  Oncology Flowsheet 07/18/2015 07/25/2015  ferumoxytol (FERAHEME) IV 510 mg 510 mg    Labs today: CBC diff, iron/TIBC, ferritin  Labs in 6 weeks: CBC diff, iron/TIBC, ferritin   DNR (do not resuscitate) DNR, in the setting of extensive stage small cell lung cancer.  Confirmed on 10/16/2015.  Sacral pressure sore Stage I pressure sore of right sacral area with light exudate measuring 1.2 x 0.7 cm.    Treatment: Thin Duoderm applied with instructions to change every 7 days or before if it gets soiled.   Current status: Resolved    ORDERS PLACED FOR THIS ENCOUNTER: Orders Placed This Encounter  Procedures  . US Venous Img Lower Unilateral Left  . CT Abdomen Pelvis W Contrast  . CT Chest W Contrast  . Iron and TIBC  . Ferritin  . Iron and TIBC  . Ferritin    MEDICATIONS PRESCRIBED THIS ENCOUNTER: Meds ordered this encounter  Medications  . DISCONTD: sodium polystyrene (KAYEXALATE) 15 GM/60ML suspension 30 g    Sig:   . sodium polystyrene (KAYEXALATE) 15 GM/60ML suspension 30 g    Sig:   . sodium polystyrene (KAYEXALATE) 15 GM/60ML suspension    Sig: Take 120 mLs (30 g  total) by mouth once a  week.    Dispense:  500 mL    Refill:  0  . sodium polystyrene (KAYEXALATE) powder    Sig: Take by mouth once. Take 30 grams weekly    Dispense:  454 g    Refill:  0    Order Specific Question:  Supervising Provider    Answer:  Patrici Ranks [id number]    THERAPY PLAN:  Continue with palliative treatment as planned.  Will restaging him with CT imaging following cycle #3.  All questions were answered. The patient knows to call the clinic with any problems, questions or concerns. We can certainly see the patient much sooner if necessary.  Patient and plan discussed with Dr. Ancil Linsey and she is in agreement with the aforementioned.   This note is electronically signed by: Doy Mince 11/05/2015 5:29 PM

## 2015-11-04 NOTE — Assessment & Plan Note (Signed)
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  Oncology Flowsheet 07/18/2015 07/25/2015  ferumoxytol (FERAHEME) IV 510 mg 510 mg    Labs today: CBC diff, iron/TIBC, ferritin  Labs in 6 weeks: CBC diff, iron/TIBC, ferritin

## 2015-11-04 NOTE — Assessment & Plan Note (Signed)
DNR, in the setting of extensive stage small cell lung cancer.  Confirmed on 10/16/2015.

## 2015-11-04 NOTE — Assessment & Plan Note (Addendum)
Extensive stage small cell lung cancer status post VATS with endobronchial ultrasound and mediastinal endoscopy by Dr. Roxan Hockey on 10/13/2015 resulting in aforementioned diagnosis.  Began palliative Carboplatin/Etoposide on 10/16/2015.  Oncology history updated.  Patients with extended stage disease, the median survival is 8 to 13 months, and the five-year survival rate is 1 to 2 percent.  In most series, less than 5 percent of those with Extensive Stage SCLC survive beyond two years.  Prophylactic cranial irradiation decreases the incidence of symptomatic brain metastases in patients who have responded to systemic chemotherapy, although its impact on overall survival is uncertain. For patients with a response to systemic chemotherapy, thoracic radiation may be of benefit in increasing the percentage of long-term survivors.   DNR   First cycle of chemotherapy was given peripherally without any difficulties.  Pre-treatment labs today: CBC diff, CMET, Magnesium.  Labs meet treatment parameters today.  Hyperkalemia noted.  Order placed for 30 g of Kayexalate today in the clinic.  Given his recurrent hyperkalemia, I have ordered Kayexalate 30 grams weekly and this is escribed to his pharmacy.  Left LE edema, new.  Order placed for STAT left LE [place] to evaluate for DVT.  He needs imaging of brain and we discussed this in detail today.  I reviewed the nature history of small cell lung cancer and its predilection for intracranial metastases.  He is educated that if present, he would be a candidate for XRT.   He has been resistant to aggressive intervention thus far.  He is asymptomatic.  He reports that he believes in [place] and he is healed.  "By his stripes, I am healed."  He declines imaging of brain.  He also has a strong history of claustrophobia.  He is due for restaging scans following cycle 3 of chemotherapy. Clinically he is improved. Orders place for CT chest, abdomen, and pelvis with contrast  following cycle #3.  Return in 21 days for follow-up and cycle #3 of treatment.  Addendum: Left lower extremity US is negative for DVT.

## 2015-11-05 ENCOUNTER — Encounter (HOSPITAL_BASED_OUTPATIENT_CLINIC_OR_DEPARTMENT_OTHER): Payer: Medicaid Other | Admitting: Oncology

## 2015-11-05 ENCOUNTER — Inpatient Hospital Stay (HOSPITAL_COMMUNITY): Payer: Medicare Other

## 2015-11-05 ENCOUNTER — Encounter (HOSPITAL_BASED_OUTPATIENT_CLINIC_OR_DEPARTMENT_OTHER): Payer: Medicaid Other

## 2015-11-05 ENCOUNTER — Ambulatory Visit (HOSPITAL_COMMUNITY): Payer: Medicare Other | Admitting: Hematology & Oncology

## 2015-11-05 ENCOUNTER — Ambulatory Visit (HOSPITAL_COMMUNITY)
Admission: RE | Admit: 2015-11-05 | Discharge: 2015-11-05 | Disposition: A | Discharge status: Home / Self Care | Payer: Medicare Other | Source: Ambulatory Visit | Attending: Oncology | Admitting: Oncology

## 2015-11-05 ENCOUNTER — Encounter (HOSPITAL_COMMUNITY): Payer: Self-pay | Admitting: Oncology

## 2015-11-05 ENCOUNTER — Encounter: Payer: Self-pay | Admitting: Dietician

## 2015-11-05 VITALS — BP 146/53 | HR 79 | Temp 98.3°F | Resp 18

## 2015-11-05 VITALS — BP 126/60 | HR 74 | Temp 97.8°F | Resp 20 | Wt 140.0 lb

## 2015-11-05 DIAGNOSIS — Z5111 Encounter for antineoplastic chemotherapy: Secondary | ICD-10-CM | POA: Diagnosis not present

## 2015-11-05 DIAGNOSIS — C3401 Malignant neoplasm of right main bronchus: Secondary | ICD-10-CM | POA: Diagnosis not present

## 2015-11-05 DIAGNOSIS — L89151 Pressure ulcer of sacral region, stage 1: Secondary | ICD-10-CM

## 2015-11-05 DIAGNOSIS — E875 Hyperkalemia: Secondary | ICD-10-CM

## 2015-11-05 DIAGNOSIS — R600 Localized edema: Secondary | ICD-10-CM | POA: Insufficient documentation

## 2015-11-05 DIAGNOSIS — D500 Iron deficiency anemia secondary to blood loss (chronic): Secondary | ICD-10-CM | POA: Diagnosis not present

## 2015-11-05 DIAGNOSIS — Z66 Do not resuscitate: Secondary | ICD-10-CM | POA: Diagnosis not present

## 2015-11-05 DIAGNOSIS — C3491 Malignant neoplasm of unspecified part of right bronchus or lung: Secondary | ICD-10-CM

## 2015-11-05 DIAGNOSIS — I5020 Unspecified systolic (congestive) heart failure: Secondary | ICD-10-CM | POA: Diagnosis not present

## 2015-11-05 DIAGNOSIS — M7989 Other specified soft tissue disorders: Secondary | ICD-10-CM | POA: Diagnosis not present

## 2015-11-05 DIAGNOSIS — R918 Other nonspecific abnormal finding of lung field: Secondary | ICD-10-CM | POA: Diagnosis not present

## 2015-11-05 DIAGNOSIS — C3490 Malignant neoplasm of unspecified part of unspecified bronchus or lung: Secondary | ICD-10-CM

## 2015-11-05 LAB — COMPREHENSIVE METABOLIC PANEL
ALT: 13 U/L — AB (ref 17–63)
ANION GAP: 6 (ref 5–15)
AST: 15 U/L (ref 15–41)
Albumin: 3 g/dL — ABNORMAL LOW (ref 3.5–5.0)
Alkaline Phosphatase: 126 U/L (ref 38–126)
BUN: 45 mg/dL — ABNORMAL HIGH (ref 6–20)
CALCIUM: 8.7 mg/dL — AB (ref 8.9–10.3)
CHLORIDE: 99 mmol/L — AB (ref 101–111)
CO2: 27 mmol/L (ref 22–32)
CREATININE: 3.3 mg/dL — AB (ref 0.61–1.24)
GFR, EST AFRICAN AMERICAN: 21 mL/min — AB (ref >60)
GFR, EST NON AFRICAN AMERICAN: 18 mL/min — AB (ref >60)
Glucose, Bld: 168 mg/dL — ABNORMAL HIGH (ref 65–99)
Potassium: 5.2 mmol/L — ABNORMAL HIGH (ref 3.5–5.1)
SODIUM: 132 mmol/L — AB (ref 135–145)
Total Bilirubin: 0.5 mg/dL (ref 0.3–1.2)
Total Protein: 6 g/dL — ABNORMAL LOW (ref 6.5–8.1)

## 2015-11-05 LAB — CBC WITH DIFFERENTIAL/PLATELET
Basophils Absolute: 0 10*3/uL (ref 0.0–0.1)
Basophils Relative: 0 %
EOS ABS: 0.1 10*3/uL (ref 0.0–0.7)
EOS PCT: 0 %
HCT: 28.6 % — ABNORMAL LOW (ref 39.0–52.0)
Hemoglobin: 9.6 g/dL — ABNORMAL LOW (ref 13.0–17.0)
LYMPHS ABS: 0.9 10*3/uL (ref 0.7–4.0)
LYMPHS PCT: 6 %
MCH: 30.6 pg (ref 26.0–34.0)
MCHC: 33.6 g/dL (ref 30.0–36.0)
MCV: 91.1 fL (ref 78.0–100.0)
MONO ABS: 0.8 10*3/uL (ref 0.1–1.0)
MONOS PCT: 6 %
Neutro Abs: 11.4 10*3/uL — ABNORMAL HIGH (ref 1.7–7.7)
Neutrophils Relative %: 87 %
PLATELETS: 199 10*3/uL (ref 150–400)
RBC: 3.14 MIL/uL — AB (ref 4.22–5.81)
RDW: 15.6 % — AB (ref 11.5–15.5)
WBC: 13.2 10*3/uL — ABNORMAL HIGH (ref 4.0–10.5)

## 2015-11-05 LAB — IRON AND TIBC
IRON: 51 ug/dL (ref 45–182)
SATURATION RATIOS: 27 % (ref 17.9–39.5)
TIBC: 190 ug/dL — AB (ref 250–450)
UIBC: 139 ug/dL

## 2015-11-05 LAB — FERRITIN: Ferritin: 338 ng/mL — ABNORMAL HIGH (ref 24–336)

## 2015-11-05 MED ORDER — SODIUM POLYSTYRENE SULFONATE 15 GM/60ML PO SUSP: 30.0000 g | Freq: Once | ORAL | Status: DC

## 2015-11-05 MED ORDER — SODIUM POLYSTYRENE SULFONATE PO POWD: Freq: Once | ORAL | Status: DC

## 2015-11-05 MED ORDER — SODIUM CHLORIDE 0.9% FLUSH: 10.0000 mL | INTRAVENOUS | Status: DC | PRN

## 2015-11-05 MED ORDER — SODIUM POLYSTYRENE SULFONATE 15 GM/60ML PO SUSP: 30.0000 g | ORAL | Status: DC

## 2015-11-05 MED ORDER — SODIUM CHLORIDE 0.9 % IV SOLN
50.0000 mg/m2 | Freq: Once | INTRAVENOUS | Status: AC
  Administered 2015-11-05: 90 mg via INTRAVENOUS
  Filled 2015-11-05: qty 4.5

## 2015-11-05 MED ORDER — SODIUM CHLORIDE 0.9 % IV SOLN
Freq: Once | INTRAVENOUS | Status: AC
  Administered 2015-11-05: 12:00:00 via INTRAVENOUS

## 2015-11-05 MED ORDER — PALONOSETRON HCL INJECTION 0.25 MG/5ML
0.2500 mg | Freq: Once | INTRAVENOUS | Status: AC
  Administered 2015-11-05: 0.25 mg via INTRAVENOUS
  Filled 2015-11-05: qty 5

## 2015-11-05 MED ORDER — SODIUM CHLORIDE 0.9 % IV SOLN
229.5000 mg | Freq: Once | INTRAVENOUS | Status: AC
  Administered 2015-11-05: 230 mg via INTRAVENOUS
  Filled 2015-11-05: qty 23

## 2015-11-05 MED ORDER — DEXAMETHASONE SODIUM PHOSPHATE 100 MG/10ML IJ SOLN
10.0000 mg | Freq: Once | INTRAMUSCULAR | Status: AC
  Administered 2015-11-05: 10 mg via INTRAVENOUS
  Filled 2015-11-05: qty 1

## 2015-11-05 MED ORDER — SODIUM POLYSTYRENE SULFONATE 15 GM/60ML PO SUSP
30.0000 g | Freq: Once | ORAL | Status: AC
  Administered 2015-11-05: 30 g via ORAL
  Filled 2015-11-05: qty 120

## 2015-11-05 NOTE — Progress Notes (Signed)
Following up with patient and dropping off case of Suplena  Wt Readings from Last 10 Encounters:  11/05/15 140 lb (63.504 kg)  10/23/15 146 lb 9.6 oz (66.497 kg)  10/17/15 147 lb (66.679 kg)  10/16/15 147 lb (66.679 kg)  10/13/15 146 lb (66.225 kg)  10/10/15 146 lb (66.225 kg)  10/10/15 146 lb 12.8 oz (66.588 kg)  10/08/15 153 lb (69.4 kg)  10/06/15 153 lb (69.4 kg)  09/27/15 152 lb (68.947 kg)   Patient weight has fallen greatly. Has lost another 6 lbs x 2 weeks.   Patient reports oral intake as fair and is suffering from taste changes/loss. (could be more renal related)  Wife says that pt is not consuming meals; he just nibbles throughout the day. From what they report, he does not eat high calorie foods. He eats bread, broth and snack foods.   He says he would like tomato soup and crackers, but knows this can be dangerous given his CKD. His K on 5/4 was 5.7.   After briefly conferring with MD/PA,  Discussed with pt and wife that given pt's rate of loss, he should eat what he wants. MD can manage with kayexalate and phosphate binders.   Briefly went through some of pt's meals and talked about how to increase increase its calories. Emphasized high protein/kcal foods. Left handout and suplena case. Emphasized importance of drinking these frequently. He is eligible for 2 more cases   Pt seemed to be in good spirits and in no distress.   Left Suplena Case and handouts titled "Increasing calories and protein"   Burtis Junes RD, LDN Nutrition Pager: [phone] 11/05/2015 11:22 AM

## 2015-11-05 NOTE — Progress Notes (Signed)
[name] Tolerated chemotherapy well today Discharged via wheelchair

## 2015-11-05 NOTE — Patient Instructions (Signed)
[place] at [facility] Discharge Instructions  RECOMMENDATIONS MADE BY THE CONSULTANT AND ANY TEST RESULTS WILL BE SENT TO YOUR REFERRING PHYSICIAN.  Exam and discussion today with [name] Crigler, PA. Kayexalate given today to help with your elevated potassium. Kayexalate prescription sent to your pharmacy - you will be taking 30 g WEEKLY. Ultrasound to be done of your left leg today (return to clinic after exam). CT scan of chest, abdomen, and pelvis after Cycle 3 of chemotherapy.   Thank you for choosing [place] at [facility] to provide your oncology and hematology care.  To afford each patient quality time with our provider, please arrive at least 15 minutes before your scheduled appointment time.   Beginning January 23rd 2017 lab work for the [facility] will be done in the  Main lab at [facility] on 1st floor. If you have a lab appointment with the [facility] please come in thru the  Main Entrance and check in at the main information desk  You need to re-schedule your appointment should you arrive 10 or more minutes late.  We strive to give you quality time with our providers, and arriving late affects you and other patients whose appointments are after yours.  Also, if you no show three or more times for appointments you may be dismissed from the clinic at the providers discretion.     Again, thank you for choosing [facility].  Our hope is that these requests will decrease the amount of time that you wait before being seen by our physicians.       _____________________________________________________________  Should you have questions after your visit to [facility], please contact our office at [phone] between the hours of 8:30 a.m. and 4:30 p.m.  Voicemails left after 4:30 p.m. will not be returned until the following business day.  For prescription refill requests, have your pharmacy contact our  office.         Resources For Cancer Patients and their Caregivers ? American Cancer Society: Can assist with transportation, wigs, general needs, runs Look Good Feel Better.        [phone] ? Cancer Care: Provides financial assistance, online support groups, medication/co-pay assistance.  1-800-813-HOPE 954-105-3242) ? [place] Assists [place] cancer patients and their families through emotional , educational and financial support.  859-115-9991 ? [place] DSS Where to apply for food stamps, Medicaid and utility assistance. (925)735-0644 ? RCATS: Transportation to medical appointments. (410)003-0411 ? Social Security Administration: May apply for disability if have a Stage IV cancer. ([phone] 320-557-5156 ? [employer], Disability and Transit Services: Assists with nutrition, care and transit needs. [place] Support Programs: '@10RELATIVEDAYS'$ @ > Cancer Support Group  2nd Tuesday of the month 1pm-2pm, Journey Room  > Creative Journey  3rd Tuesday of the month 1130am-1pm, Journey Room  > Look Good Feel Better  1st Wednesday of the month 10am-12 noon, Journey Room (Call [place] to register [phone])

## 2015-11-05 NOTE — Patient Instructions (Signed)
[place] at [facility] Discharge Instructions  RECOMMENDATIONS MADE BY THE CONSULTANT AND ANY TEST RESULTS WILL BE SENT TO YOUR REFERRING PHYSICIAN.    Chemotherapy carboplatin and vp-16 today Return as scheduled    Thank you for choosing [place] at [facility] to provide your oncology and hematology care.  To afford each patient quality time with our provider, please arrive at least 15 minutes before your scheduled appointment time.   Beginning January 23rd 2017 lab work for the [facility] will be done in the  Main lab at [facility] on 1st floor. If you have a lab appointment with the [facility] please come in thru the  Main Entrance and check in at the main information desk  You need to re-schedule your appointment should you arrive 10 or more minutes late.  We strive to give you quality time with our providers, and arriving late affects you and other patients whose appointments are after yours.  Also, if you no show three or more times for appointments you may be dismissed from the clinic at the providers discretion.     Again, thank you for choosing [facility].  Our hope is that these requests will decrease the amount of time that you wait before being seen by our physicians.       _____________________________________________________________  Should you have questions after your visit to [facility], please contact our office at [phone] between the hours of 8:30 a.m. and 4:30 p.m.  Voicemails left after 4:30 p.m. will not be returned until the following business day.  For prescription refill requests, have your pharmacy contact our office.         Resources For Cancer Patients and their Caregivers ? American Cancer Society: Can assist with transportation, wigs, general needs, runs Look Good Feel Better.        ([phone] ? Cancer Care: Provides financial assistance, online support  groups, medication/co-pay assistance.  1-800-813-HOPE 8700586684) ? [place] Assists [place] cancer patients and their families through emotional , educational and financial support.  347-462-0192 ? [place] DSS Where to apply for food stamps, Medicaid and utility assistance. [phone] 610-8464 ? RCATS: Transportation to medical appointments. ([phone] 344-9377 ? Social Security Administration: May apply for disability if have a Stage IV cancer. 225-721-2326 906-486-1739 ? [employer], Disability and Transit Services: Assists with nutrition, care and transit needs. [place] Support Programs: '@10RELATIVEDAYS'$ @ > Cancer Support Group  2nd Tuesday of the month 1pm-2pm, Journey Room  > Creative Journey  3rd Tuesday of the month 1130am-1pm, Journey Room  > Look Good Feel Better  1st Wednesday of the month 10am-12 noon, Journey Room (Call [place] to register [phone])

## 2015-11-06 ENCOUNTER — Encounter (HOSPITAL_BASED_OUTPATIENT_CLINIC_OR_DEPARTMENT_OTHER): Payer: Medicaid Other

## 2015-11-06 ENCOUNTER — Encounter (HOSPITAL_COMMUNITY): Payer: Self-pay

## 2015-11-06 VITALS — BP 151/67 | HR 86 | Temp 98.1°F | Resp 20 | Wt 144.0 lb

## 2015-11-06 DIAGNOSIS — Z5111 Encounter for antineoplastic chemotherapy: Secondary | ICD-10-CM

## 2015-11-06 DIAGNOSIS — C3491 Malignant neoplasm of unspecified part of right bronchus or lung: Secondary | ICD-10-CM | POA: Diagnosis not present

## 2015-11-06 MED ORDER — SODIUM CHLORIDE 0.9 % IV SOLN
10.0000 mg | Freq: Once | INTRAVENOUS | Status: AC
  Administered 2015-11-06: 10 mg via INTRAVENOUS
  Filled 2015-11-06: qty 1

## 2015-11-06 MED ORDER — PEGFILGRASTIM 6 MG/0.6ML ~~LOC~~ PSKT
PREFILLED_SYRINGE | SUBCUTANEOUS | Status: AC
  Filled 2015-11-06: qty 0.6

## 2015-11-06 MED ORDER — SODIUM CHLORIDE 0.9 % IV SOLN
50.0000 mg/m2 | Freq: Once | INTRAVENOUS | Status: AC
  Administered 2015-11-06: 90 mg via INTRAVENOUS
  Filled 2015-11-06: qty 4.5

## 2015-11-06 MED ORDER — SODIUM CHLORIDE 0.9 % IV SOLN
Freq: Once | INTRAVENOUS | Status: AC
  Administered 2015-11-06: 14:00:00 via INTRAVENOUS

## 2015-11-06 MED ORDER — SODIUM CHLORIDE 0.9% FLUSH: 10.0000 mL | INTRAVENOUS | Status: DC | PRN

## 2015-11-06 NOTE — Progress Notes (Signed)
[name]  Tolerated chemotherapy well today Discharged via walker with wife

## 2015-11-06 NOTE — Patient Instructions (Signed)
[place] at [facility] Discharge Instructions  RECOMMENDATIONS MADE BY THE CONSULTANT AND ANY TEST RESULTS WILL BE SENT TO YOUR REFERRING PHYSICIAN.  VP-16 today Return tomorrow for chemotherapy again Follow up as scheduled   Thank you for choosing [place] at [facility] to provide your oncology and hematology care.  To afford each patient quality time with our provider, please arrive at least 15 minutes before your scheduled appointment time.   Beginning January 23rd 2017 lab work for the [facility] will be done in the  Main lab at [facility] on 1st floor. If you have a lab appointment with the [facility] please come in thru the  Main Entrance and check in at the main information desk  You need to re-schedule your appointment should you arrive 10 or more minutes late.  We strive to give you quality time with our providers, and arriving late affects you and other patients whose appointments are after yours.  Also, if you no show three or more times for appointments you may be dismissed from the clinic at the providers discretion.     Again, thank you for choosing [facility].  Our hope is that these requests will decrease the amount of time that you wait before being seen by our physicians.       _____________________________________________________________  Should you have questions after your visit to [facility], please contact our office at [phone] between the hours of 8:30 a.m. and 4:30 p.m.  Voicemails left after 4:30 p.m. will not be returned until the following business day.  For prescription refill requests, have your pharmacy contact our office.         Resources For Cancer Patients and their Caregivers ? American Cancer Society: Can assist with transportation, wigs, general needs, runs Look Good Feel Better.        819-607-8227 ? Cancer Care: Provides financial assistance, online  support groups, medication/co-pay assistance.  1-800-813-HOPE [phone]) ? [place] Assists [place] cancer patients and their families through emotional , educational and financial support.  [phone] ? [place] DSS Where to apply for food stamps, Medicaid and utility assistance. 717-699-0722 ? RCATS: Transportation to medical appointments. 562-668-4331 ? Social Security Administration: May apply for disability if have a Stage IV cancer. [phone] 734-289-7962 ? [employer], Disability and Transit Services: Assists with nutrition, care and transit needs. [place] Support Programs: '@10RELATIVEDAYS'$ @ > Cancer Support Group  2nd Tuesday of the month 1pm-2pm, Journey Room  > Creative Journey  3rd Tuesday of the month 1130am-1pm, Journey Room  > Look Good Feel Better  1st Wednesday of the month 10am-12 noon, Journey Room (Call [place] to register [phone])

## 2015-11-07 ENCOUNTER — Encounter (HOSPITAL_BASED_OUTPATIENT_CLINIC_OR_DEPARTMENT_OTHER): Payer: Medicaid Other

## 2015-11-07 VITALS — BP 153/69 | HR 79 | Temp 98.1°F | Resp 18 | Wt 145.4 lb

## 2015-11-07 DIAGNOSIS — Z5111 Encounter for antineoplastic chemotherapy: Secondary | ICD-10-CM

## 2015-11-07 DIAGNOSIS — C3491 Malignant neoplasm of unspecified part of right bronchus or lung: Secondary | ICD-10-CM | POA: Diagnosis not present

## 2015-11-07 DIAGNOSIS — Z5189 Encounter for other specified aftercare: Secondary | ICD-10-CM | POA: Diagnosis not present

## 2015-11-07 MED ORDER — SODIUM CHLORIDE 0.9 % IV SOLN
10.0000 mg | Freq: Once | INTRAVENOUS | Status: AC
  Administered 2015-11-07: 10 mg via INTRAVENOUS
  Filled 2015-11-07: qty 1

## 2015-11-07 MED ORDER — SODIUM CHLORIDE 0.9 % IV SOLN
Freq: Once | INTRAVENOUS | Status: AC
  Administered 2015-11-07: 13:00:00 via INTRAVENOUS

## 2015-11-07 MED ORDER — SODIUM CHLORIDE 0.9% FLUSH: 10.0000 mL | INTRAVENOUS | Status: DC | PRN

## 2015-11-07 MED ORDER — PEGFILGRASTIM 6 MG/0.6ML ~~LOC~~ PSKT
6.0000 mg | PREFILLED_SYRINGE | Freq: Once | SUBCUTANEOUS | Status: AC
  Administered 2015-11-07: 6 mg via SUBCUTANEOUS

## 2015-11-07 MED ORDER — PEGFILGRASTIM 6 MG/0.6ML ~~LOC~~ PSKT
PREFILLED_SYRINGE | SUBCUTANEOUS | Status: AC
  Filled 2015-11-07: qty 0.6

## 2015-11-07 MED ORDER — ETOPOSIDE CHEMO INJECTION 1 GM/50ML
50.0000 mg/m2 | Freq: Once | INTRAVENOUS | Status: AC
  Administered 2015-11-07: 90 mg via INTRAVENOUS
  Filled 2015-11-07: qty 4.5

## 2015-11-07 NOTE — Telephone Encounter (Signed)
Erroneous encounter

## 2015-11-07 NOTE — Progress Notes (Signed)
Patient tolerated infusion well.  VSS.   

## 2015-11-07 NOTE — Patient Instructions (Signed)
[facility] Discharge Instructions for Patients Receiving Chemotherapy   Beginning January 23rd 2017 lab work for the [facility] will be done in the  Main lab at [facility] on 1st floor. If you have a lab appointment with the [facility] please come in thru the  Main Entrance and check in at the main information desk   Today you received the following chemotherapy agents: Etoposide.     If you develop nausea and vomiting, or diarrhea that is not controlled by your medication, call the clinic.  The clinic phone number is [phone]. Office hours are Monday-Friday 8:30am-5:00pm.  BELOW ARE SYMPTOMS THAT SHOULD BE REPORTED IMMEDIATELY:  *FEVER GREATER THAN 101.0 F  *CHILLS WITH OR WITHOUT FEVER  NAUSEA AND VOMITING THAT IS NOT CONTROLLED WITH YOUR NAUSEA MEDICATION  *UNUSUAL SHORTNESS OF BREATH  *UNUSUAL BRUISING OR BLEEDING  TENDERNESS IN MOUTH AND THROAT WITH OR WITHOUT PRESENCE OF ULCERS  *URINARY PROBLEMS  *BOWEL PROBLEMS  UNUSUAL RASH Items with * indicate a potential emergency and should be followed up as soon as possible. If you have an emergency after office hours please contact your primary care physician or go to the nearest emergency department.  Please call the clinic during office hours if you have any questions or concerns.   You may also contact the Patient Navigator at [phone] should you have any questions or need assistance in obtaining follow up care.      Resources For Cancer Patients and their Caregivers ? American Cancer Society: Can assist with transportation, wigs, general needs, runs Look Good Feel Better.        ([phone] ? Cancer Care: Provides financial assistance, online support groups, medication/co-pay assistance.  1-800-813-HOPE 2622116644) ? [place] Assists [place] cancer patients and their families through emotional , educational and financial support.   831-460-5928 ? [place] DSS Where to apply for food stamps, Medicaid and utility assistance. 7437402445 ? RCATS: Transportation to medical appointments. 408-244-5860 ? Social Security Administration: May apply for disability if have a Stage IV cancer. [phone] ? [employer], Disability and Transit Services: Assists with nutrition, care and transit needs. [phone]

## 2015-11-11 DIAGNOSIS — E1142 Type 2 diabetes mellitus with diabetic polyneuropathy: Secondary | ICD-10-CM | POA: Diagnosis not present

## 2015-11-12 ENCOUNTER — Other Ambulatory Visit: Payer: Self-pay | Admitting: Family

## 2015-11-18 ENCOUNTER — Telehealth: Payer: Self-pay | Admitting: Nurse Practitioner

## 2015-11-18 ENCOUNTER — Other Ambulatory Visit: Payer: Self-pay | Admitting: *Deleted

## 2015-11-18 DIAGNOSIS — N179 Acute kidney failure, unspecified: Secondary | ICD-10-CM

## 2015-11-18 DIAGNOSIS — R7989 Other specified abnormal findings of blood chemistry: Secondary | ICD-10-CM

## 2015-11-18 NOTE — Telephone Encounter (Signed)
Referral made 

## 2015-11-20 ENCOUNTER — Ambulatory Visit: Payer: Medicare Other | Admitting: Gastroenterology

## 2015-11-26 ENCOUNTER — Encounter (HOSPITAL_COMMUNITY): Payer: Medicare Other | Attending: Hematology & Oncology

## 2015-11-26 ENCOUNTER — Inpatient Hospital Stay (HOSPITAL_COMMUNITY): Payer: Medicare Other

## 2015-11-26 ENCOUNTER — Encounter (HOSPITAL_BASED_OUTPATIENT_CLINIC_OR_DEPARTMENT_OTHER): Payer: Medicare Other | Admitting: Hematology & Oncology

## 2015-11-26 ENCOUNTER — Encounter (HOSPITAL_COMMUNITY): Payer: Self-pay | Admitting: Hematology & Oncology

## 2015-11-26 VITALS — BP 156/62 | HR 92 | Temp 98.2°F | Resp 18

## 2015-11-26 VITALS — BP 136/45 | HR 85 | Temp 97.7°F | Resp 18 | Wt 148.6 lb

## 2015-11-26 DIAGNOSIS — C3491 Malignant neoplasm of unspecified part of right bronchus or lung: Secondary | ICD-10-CM

## 2015-11-26 DIAGNOSIS — D500 Iron deficiency anemia secondary to blood loss (chronic): Secondary | ICD-10-CM | POA: Insufficient documentation

## 2015-11-26 DIAGNOSIS — Z5111 Encounter for antineoplastic chemotherapy: Secondary | ICD-10-CM

## 2015-11-26 DIAGNOSIS — Q2733 Arteriovenous malformation of digestive system vessel: Secondary | ICD-10-CM

## 2015-11-26 DIAGNOSIS — N185 Chronic kidney disease, stage 5: Secondary | ICD-10-CM | POA: Diagnosis not present

## 2015-11-26 DIAGNOSIS — C3401 Malignant neoplasm of right main bronchus: Secondary | ICD-10-CM | POA: Diagnosis not present

## 2015-11-26 DIAGNOSIS — D6481 Anemia due to antineoplastic chemotherapy: Secondary | ICD-10-CM

## 2015-11-26 DIAGNOSIS — T451X5A Adverse effect of antineoplastic and immunosuppressive drugs, initial encounter: Secondary | ICD-10-CM

## 2015-11-26 DIAGNOSIS — C3490 Malignant neoplasm of unspecified part of unspecified bronchus or lung: Secondary | ICD-10-CM

## 2015-11-26 LAB — COMPREHENSIVE METABOLIC PANEL
ALK PHOS: 136 U/L — AB (ref 38–126)
ALT: 12 U/L — AB (ref 17–63)
AST: 17 U/L (ref 15–41)
Albumin: 3 g/dL — ABNORMAL LOW (ref 3.5–5.0)
Anion gap: 4 — ABNORMAL LOW (ref 5–15)
BILIRUBIN TOTAL: 0.5 mg/dL (ref 0.3–1.2)
BUN: 43 mg/dL — ABNORMAL HIGH (ref 6–20)
CALCIUM: 8.4 mg/dL — AB (ref 8.9–10.3)
CO2: 28 mmol/L (ref 22–32)
CREATININE: 2.95 mg/dL — AB (ref 0.61–1.24)
Chloride: 105 mmol/L (ref 101–111)
GFR calc non Af Amer: 21 mL/min — ABNORMAL LOW (ref >60)
GFR, EST AFRICAN AMERICAN: 24 mL/min — AB (ref >60)
Glucose, Bld: 138 mg/dL — ABNORMAL HIGH (ref 65–99)
Potassium: 4.8 mmol/L (ref 3.5–5.1)
SODIUM: 137 mmol/L (ref 135–145)
Total Protein: 5.7 g/dL — ABNORMAL LOW (ref 6.5–8.1)

## 2015-11-26 LAB — CBC WITH DIFFERENTIAL/PLATELET
Basophils Absolute: 0.1 10*3/uL (ref 0.0–0.1)
Basophils Relative: 0 %
Eosinophils Absolute: 0.1 10*3/uL (ref 0.0–0.7)
Eosinophils Relative: 0 %
HEMATOCRIT: 20.2 % — AB (ref 39.0–52.0)
HEMOGLOBIN: 6.5 g/dL — AB (ref 13.0–17.0)
LYMPHS ABS: 1.2 10*3/uL (ref 0.7–4.0)
LYMPHS PCT: 7 %
MCH: 30.2 pg (ref 26.0–34.0)
MCHC: 32.2 g/dL (ref 30.0–36.0)
MCV: 94 fL (ref 78.0–100.0)
Monocytes Absolute: 1.1 10*3/uL — ABNORMAL HIGH (ref 0.1–1.0)
Monocytes Relative: 6 %
NEUTROS ABS: 14.3 10*3/uL — AB (ref 1.7–7.7)
NEUTROS PCT: 86 %
Platelets: 235 10*3/uL (ref 150–400)
RBC: 2.15 MIL/uL — AB (ref 4.22–5.81)
RDW: 17.7 % — ABNORMAL HIGH (ref 11.5–15.5)
WBC: 16.6 10*3/uL — AB (ref 4.0–10.5)

## 2015-11-26 LAB — PREPARE RBC (CROSSMATCH)

## 2015-11-26 LAB — FERRITIN: Ferritin: 317 ng/mL (ref 24–336)

## 2015-11-26 MED ORDER — SODIUM CHLORIDE 0.9 % IV SOLN
Freq: Once | INTRAVENOUS | Status: AC
Start: 2015-11-26 — End: 2015-11-26
  Administered 2015-11-26: 11:00:00 via INTRAVENOUS

## 2015-11-26 MED ORDER — SODIUM CHLORIDE 0.9 % IV SOLN
50.0000 mg/m2 | Freq: Once | INTRAVENOUS | Status: AC
  Administered 2015-11-26: 90 mg via INTRAVENOUS
  Filled 2015-11-26: qty 4.5

## 2015-11-26 MED ORDER — HYDROCODONE-ACETAMINOPHEN 5-325 MG PO TABS: 1.0000 | ORAL_TABLET | ORAL | Status: DC | PRN

## 2015-11-26 MED ORDER — PALONOSETRON HCL INJECTION 0.25 MG/5ML
0.2500 mg | Freq: Once | INTRAVENOUS | Status: AC
  Administered 2015-11-26: 0.25 mg via INTRAVENOUS
  Filled 2015-11-26: qty 5

## 2015-11-26 MED ORDER — SODIUM CHLORIDE 0.9% FLUSH: 10.0000 mL | INTRAVENOUS | Status: DC | PRN

## 2015-11-26 MED ORDER — SODIUM CHLORIDE 0.9 % IV SOLN
242.0000 mg | Freq: Once | INTRAVENOUS | Status: AC
  Administered 2015-11-26: 240 mg via INTRAVENOUS
  Filled 2015-11-26: qty 24

## 2015-11-26 MED ORDER — SODIUM CHLORIDE 0.9 % IV SOLN
10.0000 mg | Freq: Once | INTRAVENOUS | Status: AC
  Administered 2015-11-26: 10 mg via INTRAVENOUS
  Filled 2015-11-26: qty 1

## 2015-11-26 MED ORDER — SODIUM CHLORIDE 0.9 % IV SOLN: 250.0000 mL | Freq: Once | INTRAVENOUS | Status: DC

## 2015-11-26 MED ORDER — DIPHENHYDRAMINE HCL 25 MG PO CAPS
ORAL_CAPSULE | ORAL | Status: AC
  Filled 2015-11-26: qty 1

## 2015-11-26 MED ORDER — ACETAMINOPHEN 325 MG PO TABS
ORAL_TABLET | ORAL | Status: AC
Start: 2015-11-26 — End: 2015-11-26
  Filled 2015-11-26: qty 2

## 2015-11-26 MED ORDER — DIPHENHYDRAMINE HCL 25 MG PO CAPS
25.0000 mg | ORAL_CAPSULE | Freq: Once | ORAL | Status: AC
  Administered 2015-11-26: 25 mg via ORAL

## 2015-11-26 MED ORDER — ACETAMINOPHEN 325 MG PO TABS
650.0000 mg | ORAL_TABLET | Freq: Once | ORAL | Status: AC
  Administered 2015-11-26: 650 mg via ORAL

## 2015-11-26 NOTE — Progress Notes (Signed)
[name], [place] [address]  No diagnosis found.  REASON FOR VISIT:    Small cell lung cancer ([place])   09/26/2015 Initial Diagnosis Small cell lung cancer ([place])   09/26/2015 Imaging CT CAP- Extensive malignancy in the thorax. Large right hilar/mediastinal masslike soft tissue density causing encasement and narrowing of the right mainstem bronchus and bronchus intermedius. Discrete mass difficult separate from the extensive multifo..   09/26/2015 - 09/30/2015 Hospital Admission Epigastric pain   09/29/2015 Procedure Bronchoscopy by Dr. Luan Pulling   09/29/2015 Pathology Results Bronchial brushing and washing- BENIGN REACTIVE/REPARATIVE CHANGES.   10/13/2015 Procedure VIDEO BRONCHOSCOPY WITH ENDOBRONCHIAL ULTRASOUND and MEDIASTINOSCOPY by Dr. Roxan Hockey   10/14/2015 Pathology Results 1. Lymph node, biopsy, 2R - METASTATIC SMALL CELL CARCINOMA. 2. Lymph node, biopsy, cervical - ONE BENIGN LYMPH NODE. 3. Lymph node, biopsy, 2R #2 - METASTATIC SMALL CELL CARCINOMA.   10/16/2015 -  Chemotherapy Carboplatin/Etoposide (dose reduced based upon renal function).   10/16/2015 Code Status DNR   10/16/2015 Imaging [place] L upper extremity- No evidence of deep venous thrombosis.   11/05/2015 Imaging [place] of L LE- No evidence of deep venous thrombosis.   History of Iron Deficiency   INTERVAL HISTORY: [name] 65 y.o. male returns for followup of Extensive Stage SCLC. 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 Hgb today is 6.5 gm/dl. He notes that he could tell he was getting "low on blood again", he never thought to call.  [name] is accompanied by his wife and is in a treatment chair. He is here for Cycle #3 Carboplatin / Etoposide.I personally reviewed and went over laboratory studies with the patient.   He is eating better now and able to eat solid foods. His breathing has improved, as well. He has quit smoking. His mood  is okay.  His wife reports he never complains of nausea after treatment. He denies blood in stool. He does know if his blood is low when he feels his heartbeat in his head. He denies headaches or blurry vision. He currently denies CP.   His wife reports he occasionally grabs the right lower portion of his abdomen and complains of pain. She notes this is intermittent. He is taking Miralax.  They have received gas cards and this has been helpful with transportation back and forth for treatment.  He denies nausea, vomiting. No diarrhea. He notes that chemotherapy overall has not made him feel "bad".   Past Medical History  Diagnosis Date  . Diverticulosis 03/04/10  . Peripheral vascular disease, unspecified ([place])   . Undiagnosed cardiac murmurs   . Other symptoms involving cardiovascular system   . HLD (hyperlipidemia)   . HTN (hypertension)   . Type II or unspecified type diabetes mellitus without mention of complication, not stated as uncontrolled   . Ulcer   . Carotid artery occlusion   . Umbilical hernia now    has not been repaired  . Blood transfusion   . AVM (arteriovenous malformation) of colon with hemorrhage   . Gastritis   . Internal hemorrhoids   . Right carotid bruit   . Shortness of breath     noted /w low Hgb  . GERD (gastroesophageal reflux disease)   . Arthritis     back  . Anemia     8/3,4,5- blood transfusion- [facility], colonoscopy- done & found 2 areas of bleeding   . Tobacco use disorder   .  Anxiety   . CHF (congestive heart failure) ([place])     3 yrs ago  . Irregular heart beat   . Iron deficiency anemia due to chronic blood loss 10/07/2010  . Pneumonia April 2017  . Chronic kidney disease     renal failure /w osteomyelitis- 2010  . Chronic renal disease, stage 5, glomerular filtration rate less than or equal to 15 mL/min/1.73 square meter (HCC) 10/07/2010  . Difficulty swallowing solids   . Lung mass 09/26/2015  . DNR (do not resuscitate) 10/16/2015    10/16/2015    . 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[place]); Diabetic peripheral neuropathy ([place]); Iron deficiency anemia due to chronic blood loss; Noncompliance; Occlusion and stenosis of carotid artery without mention of cerebral infarction; GI bleeding; Hyponatremia; Acute renal failure ([place]); Aftercare following surgery of the circulatory system, NEC; AVM (arteriovenous malformation) of colon with hemorrhage; GERD (gastroesophageal reflux disease); Anxiety; CHF (congestive heart failure) (HCC); BPH (benign prostatic hyperplasia); Nausea without vomiting; Abdominal bloating; Dyspepsia; Dysphagia; Anemia; Constipation; Acute renal failure superimposed on stage 4 chronic kidney disease ([place]); Weakness; Mass of oral cavity; Left arm swelling; Hyperkalemia; Absolute anemia; Pneumonia; Small cell lung cancer ([place]); DNR (do not resuscitate); and Sacral pressure sore on his problem list.     is allergic to zocor; asa; doxycycline; hctz; iodine; and sulfa antibiotics.  [name] does not currently have medications on file.  Past Surgical History  Procedure Laterality Date  . Colonoscopy  03/04/2010    Dr. Princella Pellegrini AMV without mention of ablation, scattered diverticula, internal hemorrhoids  . Bilateral common superficial femoral and profudus artery endarterectomies      2003    Dr. Sherren Mocha Early  . Esophagogastroduodenoscopy  03/2000    Dr. Tharon Aquas gastritis, H.Pylori gastritis, ?treated  . Esophagogastroduodenoscopy  05/2006    Dr. Marcello Fennel recurrent GI bleed. antral gastritis, single gastric AVM s/p APC, CLO results?  . Colonoscopy  11/2004    Dr. Barkley Bruns  .  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 Both of these are ablated with argon plasma coagulator./ Small external hemorrhoids  . Esophagogastroduodenoscopy  01/21/2012    SLF: MILD TO MAODERATE Gastritis/ SLOW GIB LIKEY DUE TO PT BEING ON ASA ND SUBSEQUENT BLOOD/ LOSS FROM AVMS IN STOMACH AND COLON AS WELL AS GASTRITIS  . Enteroscopy  01/21/2012    Procedure: ENTEROSCOPY;  Surgeon: Danie Binder, MD;  [facility];  Service: Endoscopy;;  . Below knee leg amputation      2010, wears prosthesis   . Endarterectomy  02/09/2012    Procedure: ENDARTERECTOMY CAROTID;  Surgeon: Rosetta Posner, MD;  Location: [facility];  Service: Vascular;  Laterality: Left;  . Carotid endarterectomy Left 02-09-12  . Esophagogastroduodenoscopy N/A 09/03/2013    Mild chronic gastritis. benign gastric polyps  . Givens capsule study N/A 09/03/2013    Dr. Oneida Alar: small bowel and colonic AVMs. no active bleeding  . Esophagogastroduodenoscopy N/A 08/19/2015    Dr. Oneida Alar: 2 large gastric AVMs in fundus, 1 actively bleeding s/p APC and clip placement, empiric Savary dilation  .  Savory dilation N/A 08/19/2015    Procedure: SAVORY DILATION;  Surgeon: Danie Binder, MD;  [facility];  Service: Endoscopy;  Laterality: N/A;  . Flexible bronchoscopy N/A 09/29/2015    Procedure: FLEXIBLE BRONCHOSCOPY;  Surgeon: Sinda Du, MD;  [facility];  Service: Cardiopulmonary;  Laterality: N/A;  . Bronchial brushings  09/29/2015    Procedure: BRONCHIAL BRUSHINGS;  Surgeon: Sinda Du, MD;  [facility];  Service: Cardiopulmonary;;  Tracheal and left lung  . Bronchial washings  09/29/2015    Procedure: BRONCHIAL WASHINGS;  Surgeon: Sinda Du, MD;  [facility];  Service: Cardiopulmonary;;  Bilatersl washings  . Vascular surgery      rt bka  and rt 1st toe amputation  . Video bronchoscopy with endobronchial ultrasound N/A 10/13/2015    Procedure: VIDEO BRONCHOSCOPY WITH ENDOBRONCHIAL ULTRASOUND;  Surgeon: Melrose Nakayama, MD;  Location: [facility];  Service: Thoracic;  Laterality: N/A;  . Mediastinoscopy N/A 10/13/2015    Procedure: MEDIASTINOSCOPY;  Surgeon: Melrose Nakayama, MD;  Location: [facility];  Service: Thoracic;  Laterality: N/A;   REVIEW OF SYSTEMS: Denies any headaches, dizziness, double vision, fevers, chills, night sweats, nausea, vomiting, diarrhea, constipation, chest pain, heart palpitations, shortness of breath, blood in stool, black tarry stool, urinary pain, urinary burning, urinary frequency, hematuria. Positive for fatigue. Tired and has no energy. Positive for leg swelling Left leg swelling Positive for abdominal pain Intermittent left lower abdominal pain  14 point review of systems was performed and is negative except as detailed under history of present illness and above  PHYSICAL EXAMINATION  ECOG PERFORMANCE STATUS: 1 - Symptomatic but completely ambulatory  Filed Vitals:   11/26/15 0947  BP: 136/45  Pulse: 85  Temp: 97.7 F (36.5 C)  Resp: 18    GENERAL:alert, no distress, well nourished, well developed, comfortable, cooperative, smiling and accompanied by his wife. SKIN: decubitus resolved HEAD: Normocephalic, No masses, lesions, tenderness or abnormalities MOUTH: 1/2 cm growth noted in the posterior aspect of the right tonsillar pillar, no thrush, no mucositis EYES: normal, PERRLA, EOMI, Conjunctiva are pink and non-injected EARS: External ears normal OROPHARYNX:lips, buccal mucosa, and tongue normal and mucous membranes are moist  NECK: supple, thyroid normal size, non-tender, without nodularity, no stridor, non-tender, trachea midline LYMPH:  no palpable lymphadenopathy BREAST:not examined LUNGS: Normal breath sounds. HEART: regular rate & rhythm, no murmurs, no gallops, S1 normal  and S2 normal ABDOMEN:abdomen soft, non-tender, normal bowel sounds and no masses or organomegaly BACK: Back symmetric, no curvature., No CVA tenderness EXTREMITIES:less then 2 second capillary refill, no joint deformities, effusion, or inflammation, no edema, no skin discoloration, Positive findings:  Right BLK amputation.   NEURO: alert & oriented x 3 with fluent speech, no focal motor/sensory deficits, gait normal   LABORATORY DATA: I have reviewed the data as listed.  Results for [name], [name] (MRN [id number]) as of 11/26/2015 10:45  Ref. Range 11/26/2015 09:30  Sodium Latest Ref Range: 135-145 mmol/L 137  Potassium Latest Ref Range: 3.5-5.1 mmol/L 4.8  Chloride Latest Ref Range: 101-111 mmol/L 105  CO2 Latest Ref Range: 22-32 mmol/L 28  BUN Latest Ref Range: 6-20 mg/dL 43 (H)  Creatinine Latest Ref Range: 0.61-1.24 mg/dL 2.95 (H)  Calcium Latest Ref Range: 8.9-10.3 mg/dL 8.4 (L)  EGFR (Non-African Amer.) Latest Ref Range: >60 mL/min 21 (L)  EGFR (African American) Latest Ref Range: >60 mL/min 24 (L)  Glucose Latest Ref Range: 65-99 mg/dL 138 (H)  Anion gap  Latest Ref Range: 5-15  4 (L)  Alkaline Phosphatase Latest Ref Range: 38-126 U/L 136 (H)  Albumin Latest Ref Range: 3.5-5.0 g/dL 3.0 (L)  AST Latest Ref Range: 15-41 U/L 17  ALT Latest Ref Range: 17-63 U/L 12 (L)  Total Protein Latest Ref Range: 6.5-8.1 g/dL 5.7 (L)  Total Bilirubin Latest Ref Range: 0.3-1.2 mg/dL 0.5  WBC Latest Ref Range: 4.0-10.5 K/uL 16.6 (H)  RBC Latest Ref Range: 4.22-5.81 MIL/uL 2.15 (L)  Hemoglobin Latest Ref Range: 13.0-17.0 g/dL 6.5 (LL)  HCT Latest Ref Range: 39.0-52.0 % 20.2 (L)  MCV Latest Ref Range: 78.0-100.0 fL 94.0  MCH Latest Ref Range: 26.0-34.0 pg 30.2  MCHC Latest Ref Range: 30.0-36.0 g/dL 32.2  RDW Latest Ref Range: 11.5-15.5 % 17.7 (H)  Platelets Latest Ref Range: 150-400 K/uL 235  Neutrophils Latest Units: % 86  Lymphocytes Latest Units: % 7  Monocytes Relative Latest Units: % 6   Eosinophil Latest Units: % 0  Basophil Latest Units: % 0  NEUT# Latest Ref Range: 1.7-7.7 K/uL 14.3 (H)  Lymphocyte # Latest Ref Range: 0.7-4.0 K/uL 1.2  Monocyte # Latest Ref Range: 0.1-1.0 K/uL 1.1 (H)  Eosinophils Absolute Latest Ref Range: 0.0-0.7 K/uL 0.1  Basophils Absolute Latest Ref Range: 0.0-0.1 K/uL 0.1    RADIOLOGY RESULTS: I have personally reviewed the radiological images as listed and agreed with the findings in the report.  Study Result     CLINICAL DATA: Left leg swelling  EXAM: Left LOWER EXTREMITY VENOUS DOPPLER ULTRASOUND  TECHNIQUE: Gray-scale sonography with graded compression, as well as color Doppler and duplex ultrasound were performed to evaluate the lower extremity deep venous systems from the level of the common femoral vein and including the common femoral, femoral, profunda femoral, popliteal and calf veins including the posterior tibial, peroneal and gastrocnemius veins when visible. The superficial great saphenous vein was also interrogated. Spectral Doppler was utilized to evaluate flow at rest and with distal augmentation maneuvers in the common femoral, femoral and popliteal veins.  COMPARISON: None.  FINDINGS: Contralateral Common Femoral Vein: Respiratory phasicity is normal and symmetric with the symptomatic side. No evidence of thrombus. Normal compressibility.  Common Femoral Vein: No evidence of thrombus. Normal compressibility, respiratory phasicity and response to augmentation.  Saphenofemoral Junction: No evidence of thrombus. Normal compressibility and flow on color Doppler imaging.  Profunda Femoral Vein: No evidence of thrombus. Normal compressibility and flow on color Doppler imaging.  Femoral Vein: No evidence of thrombus. Normal compressibility, respiratory phasicity and response to augmentation.  Popliteal Vein: No evidence of thrombus. Normal compressibility, respiratory phasicity and response to  augmentation.  Calf Veins: No evidence of thrombus. Normal compressibility and flow on color Doppler imaging.  Superficial Great Saphenous Vein: No evidence of thrombus. Normal compressibility and flow on color Doppler imaging.  Venous Reflux: None.  Other Findings: None.  IMPRESSION: No evidence of deep venous thrombosis.   Electronically Signed  By: Inez Catalina M.D.  On: 11/05/2015 15:41     PATHOLOGY:    ASSESSMENT AND PLAN:  Intestinal AVM's  Gastric AVM's Iron deficiency anemia secondary to chronic GI related blood loss CKD, Stage V EGD August 19, 2015 with gastric AVMs s/p APC therapy and clip placement LUE swelling, negative Ultrasound on 09/01/2015 Oropharyngeal lesion Extensive Stage SCLC  Decubitus Ulcer Stage I DNR status  He is improved clinically. He is here for cycle #3 of carboplatin/etoposide. He has imaging set up for the end of the month prior to cycle #4. Transportation costs have  been an issue, we have been able to provide them with gas cards which have helped significantly.  He has not had brain imaging, he notes today that he will be willing to pursue this at some point. WBRT is still questionable because of daily travel. We will continue to discuss.  We will transfuse 1U PRBC today and 1 U tomorrow. I have strongly encouraged the patient to please notify us when he notices dark stool or other symptoms that let him know he may have GI related blood loss. We will have to recheck a CBC next week.   The patient has quit smoking. He has noticeable respiratory improvement.  I have refilled his pain medication.  He will return for follow up in 3 weeks with his next cycle of treatment. CT imaging will be reviewed at this appointment.   All questions were answered. The patient knows to call the clinic with any problems, questions or concerns. We can certainly see the patient much sooner if necessary.  This document serves as a record of  services personally performed by Ancil Linsey, MD. It was created on her behalf by Arlyce Harman, a trained medical scribe. The creation of this record is based on the scribe's personal observations and the provider's statements to them. This document has been checked and approved by the attending provider.  I have reviewed the above documentation for accuracy and completeness, and I agree with the above.  This note is electronically signed by:  Molli Hazard, MD  11/26/2015 10:18 AM

## 2015-11-26 NOTE — Patient Instructions (Signed)
[place] at [facility] Discharge Instructions  RECOMMENDATIONS MADE BY THE CONSULTANT AND ANY TEST RESULTS WILL BE SENT TO YOUR REFERRING PHYSICIAN.  Ct scans ordered for 6/26  One unit of blood to be given today and one unit to be given tomorrow  Return to clinic in 3 weeks for labs and chem as scheduled    Thank you for choosing [place] at [facility] to provide your oncology and hematology care.  To afford each patient quality time with our provider, please arrive at least 15 minutes before your scheduled appointment time.   Beginning January 23rd 2017 lab work for the [facility] will be done in the  Main lab at [facility] on 1st floor. If you have a lab appointment with the [facility] please come in thru the  Main Entrance and check in at the main information desk  You need to re-schedule your appointment should you arrive 10 or more minutes late.  We strive to give you quality time with our providers, and arriving late affects you and other patients whose appointments are after yours.  Also, if you no show three or more times for appointments you may be dismissed from the clinic at the providers discretion.     Again, thank you for choosing [facility].  Our hope is that these requests will decrease the amount of time that you wait before being seen by our physicians.       _____________________________________________________________  Should you have questions after your visit to [facility], please contact our office at [phone] between the hours of 8:30 a.m. and 4:30 p.m.  Voicemails left after 4:30 p.m. will not be returned until the following business day.  For prescription refill requests, have your pharmacy contact our office.         Resources For Cancer Patients and their Caregivers ? American Cancer Society: Can assist with transportation, wigs, general needs, runs Look Good Feel  Better.        501-347-1113 ? Cancer Care: Provides financial assistance, online support groups, medication/co-pay assistance.  1-800-813-HOPE 802 847 6671) ? [place] Assists [place] cancer patients and their families through emotional , educational and financial support.  347-128-6206 ? [place] DSS Where to apply for food stamps, Medicaid and utility assistance. 386-815-1916 ? [facility]ATS: Transportation to medical appointments. 8055600541 ? Social Security Administration: May apply for disability if have a Stage IV cancer. (408)397-8234 478-848-5888 ? [employer], Disability and Transit Services: Assists with nutrition, care and transit needs. [place] Support Programs: '@10RELATIVEDAYS'$ @ > Cancer Support Group  2nd Tuesday of the month 1pm-2pm, Journey Room  > Creative Journey  3rd Tuesday of the month 1130am-1pm, Journey Room  > Look Good Feel Better  1st Wednesday of the month 10am-12 noon, Journey Room (Call [place] to register [phone])

## 2015-11-26 NOTE — Progress Notes (Signed)
Per Dr. Whitney Muse, we will proceed with chemotherapy today in addition to transfusing 2 units of PRBCs.   1525:  Tolerated chemotherapy and blood transfusion (one unit) w/o adverse reaction.  A&Ox4, in no distress.  VSS.  Discharged ambulatory.

## 2015-11-26 NOTE — Patient Instructions (Signed)
[facility] Discharge Instructions for Patients Receiving Chemotherapy   Beginning January 23rd 2017 lab work for the [facility] will be done in the  Main lab at [facility] on 1st floor. If you have a lab appointment with the [facility] please come in thru the  Main Entrance and check in at the main information desk   Today you received the following chemotherapy agents:  Carboplatin and etoposide. You also received one unit of blood today. Return tomorrow as scheduled for chemo and one unit of blood.  To help prevent nausea and vomiting after your treatment, we encourage you to take your nausea medication as prescribed. If you develop nausea, vomiting, or diarrhea that is not controlled by your medication, call the clinic.  The clinic phone number is [phone]. Office hours are Monday-Friday 8:30am-5:00pm.  BELOW ARE SYMPTOMS THAT SHOULD BE REPORTED IMMEDIATELY:  *FEVER GREATER THAN 101.0 F  *CHILLS WITH OR WITHOUT FEVER  NAUSEA AND VOMITING THAT IS NOT CONTROLLED WITH YOUR NAUSEA MEDICATION  *UNUSUAL SHORTNESS OF BREATH  *UNUSUAL BRUISING OR BLEEDING  TENDERNESS IN MOUTH AND THROAT WITH OR WITHOUT PRESENCE OF ULCERS  *URINARY PROBLEMS  *BOWEL PROBLEMS  UNUSUAL RASH Items with * indicate a potential emergency and should be followed up as soon as possible. If you have an emergency after office hours please contact your primary care physician or go to the nearest emergency department.  Please call the clinic during office hours if you have any questions or concerns.   You may also contact the Patient Navigator at [phone] should you have any questions or need assistance in obtaining follow up care.      Resources For Cancer Patients and their Caregivers ? American Cancer Society: Can assist with transportation, wigs, general needs, runs Look Good Feel Better.        4016248186 ? Cancer Care: Provides financial assistance, online  support groups, medication/co-pay assistance.  1-800-813-HOPE 787 305 2598) ? [place] Assists [place] cancer patients and their families through emotional , educational and financial support.  (519)858-2533 ? [place] DSS Where to apply for food stamps, Medicaid and utility assistance. 623-753-5466 ? RCATS: Transportation to medical appointments. 908-432-1692 ? Social Security Administration: May apply for disability if have a Stage IV cancer. (229)038-9997 984-416-4569 ? [employer], Disability and Transit Services: Assists with nutrition, care and transit needs. (445)[phone]

## 2015-11-27 ENCOUNTER — Encounter (HOSPITAL_COMMUNITY): Payer: Self-pay

## 2015-11-27 ENCOUNTER — Encounter (HOSPITAL_BASED_OUTPATIENT_CLINIC_OR_DEPARTMENT_OTHER): Payer: Medicare Other

## 2015-11-27 ENCOUNTER — Encounter (HOSPITAL_COMMUNITY): Payer: Medicare Other

## 2015-11-27 ENCOUNTER — Inpatient Hospital Stay (HOSPITAL_COMMUNITY): Payer: Medicare Other

## 2015-11-27 VITALS — BP 176/74 | HR 87 | Temp 98.0°F | Resp 18

## 2015-11-27 DIAGNOSIS — D500 Iron deficiency anemia secondary to blood loss (chronic): Secondary | ICD-10-CM | POA: Diagnosis not present

## 2015-11-27 DIAGNOSIS — C3401 Malignant neoplasm of right main bronchus: Secondary | ICD-10-CM

## 2015-11-27 DIAGNOSIS — C3491 Malignant neoplasm of unspecified part of right bronchus or lung: Secondary | ICD-10-CM

## 2015-11-27 DIAGNOSIS — Z5111 Encounter for antineoplastic chemotherapy: Secondary | ICD-10-CM | POA: Diagnosis not present

## 2015-11-27 DIAGNOSIS — T451X5A Adverse effect of antineoplastic and immunosuppressive drugs, initial encounter: Secondary | ICD-10-CM

## 2015-11-27 DIAGNOSIS — D6481 Anemia due to antineoplastic chemotherapy: Secondary | ICD-10-CM

## 2015-11-27 MED ORDER — ETOPOSIDE CHEMO INJECTION 1 GM/50ML
50.0000 mg/m2 | Freq: Once | INTRAVENOUS | Status: AC
  Administered 2015-11-27: 90 mg via INTRAVENOUS
  Filled 2015-11-27: qty 4.5

## 2015-11-27 MED ORDER — SODIUM CHLORIDE 0.9 % IV SOLN
Freq: Once | INTRAVENOUS | Status: AC
  Administered 2015-11-27: 10:00:00 via INTRAVENOUS

## 2015-11-27 MED ORDER — ACETAMINOPHEN 325 MG PO TABS
ORAL_TABLET | ORAL | Status: AC
  Filled 2015-11-27: qty 2

## 2015-11-27 MED ORDER — ACETAMINOPHEN 325 MG PO TABS
650.0000 mg | ORAL_TABLET | Freq: Once | ORAL | Status: AC
  Administered 2015-11-27: 650 mg via ORAL

## 2015-11-27 MED ORDER — SODIUM CHLORIDE 0.9 % IV SOLN
10.0000 mg | Freq: Once | INTRAVENOUS | Status: AC
  Administered 2015-11-27: 10 mg via INTRAVENOUS
  Filled 2015-11-27: qty 1

## 2015-11-27 MED ORDER — SODIUM CHLORIDE 0.9% FLUSH: 10.0000 mL | INTRAVENOUS | Status: DC | PRN

## 2015-11-27 MED ORDER — DIPHENHYDRAMINE HCL 25 MG PO CAPS
25.0000 mg | ORAL_CAPSULE | Freq: Once | ORAL | Status: AC
  Administered 2015-11-27: 25 mg via ORAL

## 2015-11-27 MED ORDER — DIPHENHYDRAMINE HCL 25 MG PO CAPS
ORAL_CAPSULE | ORAL | Status: AC
  Filled 2015-11-27: qty 1

## 2015-11-27 MED ORDER — SODIUM CHLORIDE 0.9 % IV SOLN
250.0000 mL | Freq: Once | INTRAVENOUS | Status: AC
  Administered 2015-11-27: 250 mL via INTRAVENOUS

## 2015-11-27 NOTE — Patient Instructions (Signed)
[facility] Discharge Instructions for Patients Receiving Chemotherapy   Beginning January 23rd 2017 lab work for the [facility] will be done in the  Main lab at [facility] on 1st floor. If you have a lab appointment with the [facility] please come in thru the  Main Entrance and check in at the main information desk   Today you received the following chemotherapy agents:  Etoposide You also received one unit of blood today. Return tomorrow as scheduled for Day 3 of chemotherapy.  If you develop nausea and vomiting, or diarrhea that is not controlled by your medication, call the clinic.  The clinic phone number is [phone]. Office hours are Monday-Friday 8:30am-5:00pm.  BELOW ARE SYMPTOMS THAT SHOULD BE REPORTED IMMEDIATELY:  *FEVER GREATER THAN 101.0 F  *CHILLS WITH OR WITHOUT FEVER  NAUSEA AND VOMITING THAT IS NOT CONTROLLED WITH YOUR NAUSEA MEDICATION  *UNUSUAL SHORTNESS OF BREATH  *UNUSUAL BRUISING OR BLEEDING  TENDERNESS IN MOUTH AND THROAT WITH OR WITHOUT PRESENCE OF ULCERS  *URINARY PROBLEMS  *BOWEL PROBLEMS  UNUSUAL RASH Items with * indicate a potential emergency and should be followed up as soon as possible. If you have an emergency after office hours please contact your primary care physician or go to the nearest emergency department.  Please call the clinic during office hours if you have any questions or concerns.   You may also contact the Patient Navigator at [phone] should you have any questions or need assistance in obtaining follow up care.      Resources For Cancer Patients and their Caregivers ? American Cancer Society: Can assist with transportation, wigs, general needs, runs Look Good Feel Better.        240-782-0576 ? Cancer Care: Provides financial assistance, online support groups, medication/co-pay assistance.  1-800-813-HOPE 719-649-0551) ? [place] Assists [place] cancer  patients and their families through emotional , educational and financial support.  ([phone] ? [place] DSS Where to apply for food stamps, Medicaid and utility assistance. 605-417-3735 ? RCATS: Transportation to medical appointments. [phone] ? Social Security Administration: May apply for disability if have a Stage IV cancer. 702-007-9986 [phone] ? [employer], Disability and Transit Services: Assists with nutrition, care and transit needs. [phone]

## 2015-11-27 NOTE — Progress Notes (Signed)
1415:  Tolerated tx and transfusion of one unit of blood w/o adverse reaction.  VSS.  A&Ox4, in no distress.  Discharged ambulatory in c/o spouse for transport home.

## 2015-11-27 NOTE — Progress Notes (Signed)
See chemotherapy encounter

## 2015-11-28 ENCOUNTER — Other Ambulatory Visit: Payer: Self-pay | Admitting: Family

## 2015-11-28 ENCOUNTER — Ambulatory Visit (HOSPITAL_COMMUNITY): Payer: Medicare Other | Admitting: Hematology & Oncology

## 2015-11-28 ENCOUNTER — Encounter (HOSPITAL_BASED_OUTPATIENT_CLINIC_OR_DEPARTMENT_OTHER): Payer: Medicare Other

## 2015-11-28 VITALS — BP 153/62 | HR 73 | Temp 98.3°F | Resp 18

## 2015-11-28 DIAGNOSIS — C3401 Malignant neoplasm of right main bronchus: Secondary | ICD-10-CM

## 2015-11-28 DIAGNOSIS — Z5111 Encounter for antineoplastic chemotherapy: Secondary | ICD-10-CM

## 2015-11-28 DIAGNOSIS — C3491 Malignant neoplasm of unspecified part of right bronchus or lung: Secondary | ICD-10-CM

## 2015-11-28 LAB — TYPE AND SCREEN
ABO/RH(D): O POS
Antibody Screen: NEGATIVE
UNIT DIVISION: 0
UNIT DIVISION: 0

## 2015-11-28 MED ORDER — SODIUM CHLORIDE 0.9 % IV SOLN
50.0000 mg/m2 | Freq: Once | INTRAVENOUS | Status: AC
  Administered 2015-11-28: 90 mg via INTRAVENOUS
  Filled 2015-11-28: qty 4.5

## 2015-11-28 MED ORDER — SODIUM CHLORIDE 0.9% FLUSH: 10.0000 mL | INTRAVENOUS | Status: DC | PRN

## 2015-11-28 MED ORDER — SODIUM CHLORIDE 0.9 % IV SOLN
Freq: Once | INTRAVENOUS | Status: AC
  Administered 2015-11-28: 13:00:00 via INTRAVENOUS

## 2015-11-28 MED ORDER — SODIUM CHLORIDE 0.9 % IV SOLN
10.0000 mg | Freq: Once | INTRAVENOUS | Status: AC
  Administered 2015-11-28: 10 mg via INTRAVENOUS
  Filled 2015-11-28: qty 1

## 2015-11-28 NOTE — Progress Notes (Signed)
Patient tolerated infusion well.

## 2015-11-28 NOTE — Patient Instructions (Signed)
[facility] Discharge Instructions for Patients Receiving Chemotherapy   Beginning January 23rd 2017 lab work for the [facility] will be done in the  Main lab at [facility] on 1st floor. If you have a lab appointment with the [facility] please come in thru the  Main Entrance and check in at the main information desk   Today you received the following chemotherapy agent: Etoposide.     If you develop nausea and vomiting, or diarrhea that is not controlled by your medication, call the clinic.  The clinic phone number is [phone]. Office hours are Monday-Friday 8:30am-5:00pm.  BELOW ARE SYMPTOMS THAT SHOULD BE REPORTED IMMEDIATELY:  *FEVER GREATER THAN 101.0 F  *CHILLS WITH OR WITHOUT FEVER  NAUSEA AND VOMITING THAT IS NOT CONTROLLED WITH YOUR NAUSEA MEDICATION  *UNUSUAL SHORTNESS OF BREATH  *UNUSUAL BRUISING OR BLEEDING  TENDERNESS IN MOUTH AND THROAT WITH OR WITHOUT PRESENCE OF ULCERS  *URINARY PROBLEMS  *BOWEL PROBLEMS  UNUSUAL RASH Items with * indicate a potential emergency and should be followed up as soon as possible. If you have an emergency after office hours please contact your primary care physician or go to the nearest emergency department.  Please call the clinic during office hours if you have any questions or concerns.   You may also contact the Patient Navigator at [phone] should you have any questions or need assistance in obtaining follow up care.      Resources For Cancer Patients and their Caregivers ? American Cancer Society: Can assist with transportation, wigs, general needs, runs Look Good Feel Better.        8450787185 ? Cancer Care: Provides financial assistance, online support groups, medication/co-pay assistance.  1-800-813-HOPE 343-314-6870) ? [place] Assists [place] cancer patients and their families through emotional , educational and financial support.   805-028-4007 ? [place] DSS Where to apply for food stamps, Medicaid and utility assistance. 818-562-0176 ? RCATS: Transportation to medical appointments. 978-508-1131 ? Social Security Administration: May apply for disability if have a Stage IV cancer. 365-064-1653 [phone] ? [employer], Disability and Transit Services: Assists with nutrition, care and transit needs. [phone]

## 2015-11-30 ENCOUNTER — Other Ambulatory Visit: Payer: Self-pay | Admitting: Nurse Practitioner

## 2015-11-30 ENCOUNTER — Other Ambulatory Visit (HOSPITAL_COMMUNITY): Payer: Self-pay | Admitting: Oncology

## 2015-12-01 ENCOUNTER — Encounter (HOSPITAL_COMMUNITY): Payer: Medicare Other

## 2015-12-01 ENCOUNTER — Other Ambulatory Visit (HOSPITAL_COMMUNITY): Payer: Self-pay | Admitting: *Deleted

## 2015-12-01 ENCOUNTER — Encounter (HOSPITAL_BASED_OUTPATIENT_CLINIC_OR_DEPARTMENT_OTHER): Payer: Medicare Other

## 2015-12-01 ENCOUNTER — Encounter (HOSPITAL_COMMUNITY): Payer: Self-pay

## 2015-12-01 VITALS — BP 146/49 | HR 40 | Temp 97.9°F | Resp 18

## 2015-12-01 DIAGNOSIS — Z5189 Encounter for other specified aftercare: Secondary | ICD-10-CM | POA: Diagnosis not present

## 2015-12-01 DIAGNOSIS — C3401 Malignant neoplasm of right main bronchus: Secondary | ICD-10-CM

## 2015-12-01 DIAGNOSIS — C3490 Malignant neoplasm of unspecified part of unspecified bronchus or lung: Secondary | ICD-10-CM

## 2015-12-01 DIAGNOSIS — D500 Iron deficiency anemia secondary to blood loss (chronic): Secondary | ICD-10-CM | POA: Diagnosis not present

## 2015-12-01 DIAGNOSIS — C3491 Malignant neoplasm of unspecified part of right bronchus or lung: Secondary | ICD-10-CM

## 2015-12-01 LAB — CBC WITH DIFFERENTIAL/PLATELET
BASOS ABS: 0 10*3/uL (ref 0.0–0.1)
Basophils Relative: 0 %
EOS PCT: 1 %
Eosinophils Absolute: 0 10*3/uL (ref 0.0–0.7)
HCT: 25.5 % — ABNORMAL LOW (ref 39.0–52.0)
Hemoglobin: 8.5 g/dL — ABNORMAL LOW (ref 13.0–17.0)
LYMPHS PCT: 10 %
Lymphs Abs: 0.6 10*3/uL — ABNORMAL LOW (ref 0.7–4.0)
MCH: 30.1 pg (ref 26.0–34.0)
MCHC: 33.3 g/dL (ref 30.0–36.0)
MCV: 90.4 fL (ref 78.0–100.0)
MONO ABS: 0 10*3/uL — AB (ref 0.1–1.0)
Monocytes Relative: 1 %
Neutro Abs: 5.6 10*3/uL (ref 1.7–7.7)
Neutrophils Relative %: 88 %
PLATELETS: 230 10*3/uL (ref 150–400)
RBC: 2.82 MIL/uL — AB (ref 4.22–5.81)
RDW: 17.3 % — AB (ref 11.5–15.5)
WBC: 6.3 10*3/uL (ref 4.0–10.5)

## 2015-12-01 MED ORDER — PEGFILGRASTIM INJECTION 6 MG/0.6ML ~~LOC~~
6.0000 mg | PREFILLED_SYRINGE | Freq: Once | SUBCUTANEOUS | Status: AC
  Administered 2015-12-01: 6 mg via SUBCUTANEOUS

## 2015-12-01 MED ORDER — PEGFILGRASTIM INJECTION 6 MG/0.6ML ~~LOC~~
PREFILLED_SYRINGE | SUBCUTANEOUS | Status: AC
  Filled 2015-12-01: qty 0.6

## 2015-12-01 NOTE — Patient Instructions (Signed)
[place] at [facility] Discharge Instructions  RECOMMENDATIONS MADE BY THE CONSULTANT AND ANY TEST RESULTS WILL BE SENT TO YOUR REFERRING PHYSICIAN.   neulasta today Follow up as scheduled Please call the clinic if you have any questions or concerns    Thank you for choosing [place] at [facility] to provide your oncology and hematology care.  To afford each patient quality time with our provider, please arrive at least 15 minutes before your scheduled appointment time.   Beginning January 23rd 2017 lab work for the [facility] will be done in the  Main lab at [facility] on 1st floor. If you have a lab appointment with the [place] please come in thru the  Main Entrance and check in at the main information desk  You need to re-schedule your appointment should you arrive 10 or more minutes late.  We strive to give you quality time with our providers, and arriving late affects you and other patients whose appointments are after yours.  Also, if you no show three or more times for appointments you may be dismissed from the clinic at the providers discretion.     Again, thank you for choosing [facility].  Our hope is that these requests will decrease the amount of time that you wait before being seen by our physicians.       _____________________________________________________________  Should you have questions after your visit to [facility], please contact our office at [phone] between the hours of 8:30 a.m. and 4:30 p.m.  Voicemails left after 4:30 p.m. will not be returned until the following business day.  For prescription refill requests, have your pharmacy contact our office.         Resources For Cancer Patients and their Caregivers ? American Cancer Society: Can assist with transportation, wigs, general needs, runs Look Good Feel Better.        701-316-8500 ? Cancer Care: Provides  financial assistance, online support groups, medication/co-pay assistance.  1-800-813-HOPE 450-237-0519) ? [place] Assists [place] cancer patients and their families through emotional , educational and financial support.  418-717-4533 ? [place] DSS Where to apply for food stamps, Medicaid and utility assistance. 614-695-9564 ? RCATS: Transportation to medical appointments. 820-771-8991 ? Social Security Administration: May apply for disability if have a Stage IV cancer. [phone] ? [employer], Disability and Transit Services: Assists with nutrition, care and transit needs. [place] Support Programs: '@10RELATIVEDAYS'$ @ > Cancer Support Group  2nd Tuesday of the month 1pm-2pm, Journey Room  > Creative Journey  3rd Tuesday of the month 1130am-1pm, Journey Room  > Look Good Feel Better  1st Wednesday of the month 10am-12 noon, Journey Room (Call [place] to register [phone])

## 2015-12-01 NOTE — Progress Notes (Signed)
[name] presents today for injection per the provider's orders.  neulasta administration without incident; see MAR for injection details.  Patient tolerated procedure well and without incident.  No questions or complaints noted at this time.

## 2015-12-02 ENCOUNTER — Other Ambulatory Visit (HOSPITAL_COMMUNITY): Payer: Medicare Other

## 2015-12-03 ENCOUNTER — Other Ambulatory Visit: Payer: Self-pay | Admitting: Nurse Practitioner

## 2015-12-03 DIAGNOSIS — E1129 Type 2 diabetes mellitus with other diabetic kidney complication: Secondary | ICD-10-CM | POA: Diagnosis not present

## 2015-12-03 DIAGNOSIS — N184 Chronic kidney disease, stage 4 (severe): Secondary | ICD-10-CM | POA: Diagnosis not present

## 2015-12-03 DIAGNOSIS — R801 Persistent proteinuria, unspecified: Secondary | ICD-10-CM | POA: Diagnosis not present

## 2015-12-03 DIAGNOSIS — D6481 Anemia due to antineoplastic chemotherapy: Secondary | ICD-10-CM | POA: Diagnosis not present

## 2015-12-03 DIAGNOSIS — I10 Essential (primary) hypertension: Secondary | ICD-10-CM | POA: Diagnosis not present

## 2015-12-04 DIAGNOSIS — Z7984 Long term (current) use of oral hypoglycemic drugs: Secondary | ICD-10-CM | POA: Diagnosis not present

## 2015-12-04 DIAGNOSIS — E119 Type 2 diabetes mellitus without complications: Secondary | ICD-10-CM | POA: Diagnosis not present

## 2015-12-04 DIAGNOSIS — H25813 Combined forms of age-related cataract, bilateral: Secondary | ICD-10-CM | POA: Diagnosis not present

## 2015-12-06 DIAGNOSIS — R918 Other nonspecific abnormal finding of lung field: Secondary | ICD-10-CM | POA: Diagnosis not present

## 2015-12-06 DIAGNOSIS — I5020 Unspecified systolic (congestive) heart failure: Secondary | ICD-10-CM | POA: Diagnosis not present

## 2015-12-08 ENCOUNTER — Encounter (HOSPITAL_COMMUNITY): Payer: Medicare Other

## 2015-12-08 DIAGNOSIS — C3490 Malignant neoplasm of unspecified part of unspecified bronchus or lung: Secondary | ICD-10-CM

## 2015-12-08 DIAGNOSIS — D500 Iron deficiency anemia secondary to blood loss (chronic): Secondary | ICD-10-CM | POA: Diagnosis not present

## 2015-12-08 DIAGNOSIS — C3491 Malignant neoplasm of unspecified part of right bronchus or lung: Secondary | ICD-10-CM

## 2015-12-08 LAB — CBC WITH DIFFERENTIAL/PLATELET
BASOS ABS: 0 10*3/uL (ref 0.0–0.1)
BASOS PCT: 1 %
Eosinophils Absolute: 0 10*3/uL (ref 0.0–0.7)
Eosinophils Relative: 1 %
HEMATOCRIT: 19.6 % — AB (ref 39.0–52.0)
HEMOGLOBIN: 6.4 g/dL — AB (ref 13.0–17.0)
Lymphocytes Relative: 16 %
Lymphs Abs: 0.5 10*3/uL — ABNORMAL LOW (ref 0.7–4.0)
MCH: 30.2 pg (ref 26.0–34.0)
MCHC: 32.7 g/dL (ref 30.0–36.0)
MCV: 92.5 fL (ref 78.0–100.0)
Monocytes Absolute: 0.3 10*3/uL (ref 0.1–1.0)
Monocytes Relative: 9 %
NEUTROS ABS: 2.1 10*3/uL (ref 1.7–7.7)
NEUTROS PCT: 73 %
Platelets: 48 10*3/uL — ABNORMAL LOW (ref 150–400)
RBC: 2.12 MIL/uL — AB (ref 4.22–5.81)
RDW: 18.2 % — AB (ref 11.5–15.5)
WBC: 2.8 10*3/uL — AB (ref 4.0–10.5)

## 2015-12-08 LAB — SAMPLE TO BLOOD BANK

## 2015-12-08 LAB — PREPARE RBC (CROSSMATCH)

## 2015-12-08 NOTE — Progress Notes (Unsigned)
CRITICAL VALUE ALERT Critical value received:  Hgb- 6.4 Date of notification:  12/08/2015 Time of notification:  1300  Critical value read back:  Yes.   Nurse who received alert:  Josephina Shih, RN MD notified (1st page):  Robynn Pane, PA   Patient to get two units of blood in short stay tomorrow.  Scheduler and myself called the patient to inform them of this.

## 2015-12-09 ENCOUNTER — Encounter (HOSPITAL_COMMUNITY): Payer: Self-pay

## 2015-12-09 ENCOUNTER — Encounter (HOSPITAL_COMMUNITY)
Admission: RE | Admit: 2015-12-09 | Discharge: 2015-12-09 | Disposition: A | Discharge status: Home / Self Care | Payer: Medicare Other | Source: Ambulatory Visit | Attending: Hematology & Oncology | Admitting: Hematology & Oncology

## 2015-12-09 VITALS — BP 191/75 | HR 88 | Temp 98.2°F | Resp 18 | Ht 64.0 in | Wt 148.0 lb

## 2015-12-09 DIAGNOSIS — D500 Iron deficiency anemia secondary to blood loss (chronic): Secondary | ICD-10-CM | POA: Diagnosis not present

## 2015-12-09 DIAGNOSIS — C3491 Malignant neoplasm of unspecified part of right bronchus or lung: Secondary | ICD-10-CM | POA: Insufficient documentation

## 2015-12-09 DIAGNOSIS — T451X5A Adverse effect of antineoplastic and immunosuppressive drugs, initial encounter: Secondary | ICD-10-CM

## 2015-12-09 DIAGNOSIS — D6481 Anemia due to antineoplastic chemotherapy: Secondary | ICD-10-CM

## 2015-12-09 MED ORDER — ACETAMINOPHEN 325 MG PO TABS
650.0000 mg | ORAL_TABLET | Freq: Once | ORAL | Status: AC
  Administered 2015-12-09: 650 mg via ORAL
  Filled 2015-12-09: qty 2

## 2015-12-09 MED ORDER — HEPARIN SOD (PORK) LOCK FLUSH 100 UNIT/ML IV SOLN: 500.0000 [IU] | Freq: Every day | INTRAVENOUS | Status: DC | PRN

## 2015-12-09 MED ORDER — SODIUM CHLORIDE 0.9 % IV SOLN
250.0000 mL | Freq: Once | INTRAVENOUS | Status: AC
  Administered 2015-12-09: 250 mL via INTRAVENOUS

## 2015-12-09 MED ORDER — SODIUM CHLORIDE 0.9% FLUSH: 10.0000 mL | INTRAVENOUS | Status: DC | PRN

## 2015-12-09 MED ORDER — DIPHENHYDRAMINE HCL 25 MG PO CAPS
25.0000 mg | ORAL_CAPSULE | Freq: Once | ORAL | Status: AC
  Administered 2015-12-09: 25 mg via ORAL
  Filled 2015-12-09: qty 1

## 2015-12-10 LAB — TYPE AND SCREEN
ABO/RH(D): O POS
ANTIBODY SCREEN: NEGATIVE
UNIT DIVISION: 0
UNIT DIVISION: 0

## 2015-12-15 ENCOUNTER — Other Ambulatory Visit (HOSPITAL_COMMUNITY): Payer: Self-pay | Admitting: Oncology

## 2015-12-15 ENCOUNTER — Ambulatory Visit (HOSPITAL_COMMUNITY)
Admission: RE | Admit: 2015-12-15 | Discharge: 2015-12-15 | Disposition: A | Discharge status: Home / Self Care | Payer: Medicare Other | Source: Ambulatory Visit | Attending: Oncology | Admitting: Oncology

## 2015-12-15 DIAGNOSIS — C3490 Malignant neoplasm of unspecified part of unspecified bronchus or lung: Secondary | ICD-10-CM

## 2015-12-15 DIAGNOSIS — R918 Other nonspecific abnormal finding of lung field: Secondary | ICD-10-CM | POA: Insufficient documentation

## 2015-12-15 DIAGNOSIS — I313 Pericardial effusion (noninflammatory): Secondary | ICD-10-CM | POA: Insufficient documentation

## 2015-12-15 DIAGNOSIS — R935 Abnormal findings on diagnostic imaging of other abdominal regions, including retroperitoneum: Secondary | ICD-10-CM | POA: Diagnosis not present

## 2015-12-15 DIAGNOSIS — C3491 Malignant neoplasm of unspecified part of right bronchus or lung: Secondary | ICD-10-CM

## 2015-12-17 ENCOUNTER — Encounter (HOSPITAL_BASED_OUTPATIENT_CLINIC_OR_DEPARTMENT_OTHER): Payer: Medicare Other | Admitting: Oncology

## 2015-12-17 ENCOUNTER — Encounter (HOSPITAL_BASED_OUTPATIENT_CLINIC_OR_DEPARTMENT_OTHER): Payer: Medicare Other

## 2015-12-17 ENCOUNTER — Encounter (HOSPITAL_COMMUNITY): Payer: Self-pay

## 2015-12-17 ENCOUNTER — Inpatient Hospital Stay (HOSPITAL_COMMUNITY): Payer: Medicare Other

## 2015-12-17 VITALS — BP 172/63 | HR 83 | Temp 98.4°F | Resp 20

## 2015-12-17 VITALS — BP 166/84 | HR 83 | Temp 97.5°F | Resp 20 | Wt 148.9 lb

## 2015-12-17 DIAGNOSIS — Z5111 Encounter for antineoplastic chemotherapy: Secondary | ICD-10-CM | POA: Diagnosis not present

## 2015-12-17 DIAGNOSIS — D500 Iron deficiency anemia secondary to blood loss (chronic): Secondary | ICD-10-CM

## 2015-12-17 DIAGNOSIS — C3490 Malignant neoplasm of unspecified part of unspecified bronchus or lung: Secondary | ICD-10-CM

## 2015-12-17 DIAGNOSIS — T451X5A Adverse effect of antineoplastic and immunosuppressive drugs, initial encounter: Secondary | ICD-10-CM

## 2015-12-17 DIAGNOSIS — D6481 Anemia due to antineoplastic chemotherapy: Secondary | ICD-10-CM

## 2015-12-17 DIAGNOSIS — Z66 Do not resuscitate: Secondary | ICD-10-CM

## 2015-12-17 DIAGNOSIS — C3491 Malignant neoplasm of unspecified part of right bronchus or lung: Secondary | ICD-10-CM

## 2015-12-17 DIAGNOSIS — C781 Secondary malignant neoplasm of mediastinum: Secondary | ICD-10-CM

## 2015-12-17 LAB — CBC WITH DIFFERENTIAL/PLATELET
BASOS ABS: 0.1 10*3/uL (ref 0.0–0.1)
BASOS PCT: 0 %
EOS PCT: 1 %
Eosinophils Absolute: 0.1 10*3/uL (ref 0.0–0.7)
HCT: 31.6 % — ABNORMAL LOW (ref 39.0–52.0)
HEMOGLOBIN: 10.4 g/dL — AB (ref 13.0–17.0)
LYMPHS ABS: 1.3 10*3/uL (ref 0.7–4.0)
LYMPHS PCT: 7 %
MCH: 30.2 pg (ref 26.0–34.0)
MCHC: 32.9 g/dL (ref 30.0–36.0)
MCV: 91.9 fL (ref 78.0–100.0)
Monocytes Absolute: 1.2 10*3/uL — ABNORMAL HIGH (ref 0.1–1.0)
Monocytes Relative: 6 %
NEUTROS ABS: 16.1 10*3/uL — AB (ref 1.7–7.7)
NEUTROS PCT: 86 %
PLATELETS: 195 10*3/uL (ref 150–400)
RBC: 3.44 MIL/uL — AB (ref 4.22–5.81)
RDW: 18.8 % — ABNORMAL HIGH (ref 11.5–15.5)
WBC: 18.7 10*3/uL — AB (ref 4.0–10.5)

## 2015-12-17 LAB — IRON AND TIBC
Iron: 73 ug/dL (ref 45–182)
Saturation Ratios: 30 % (ref 17.9–39.5)
TIBC: 241 ug/dL — AB (ref 250–450)
UIBC: 168 ug/dL

## 2015-12-17 LAB — COMPREHENSIVE METABOLIC PANEL
ALBUMIN: 3.3 g/dL — AB (ref 3.5–5.0)
ALK PHOS: 163 U/L — AB (ref 38–126)
ALT: 11 U/L — AB (ref 17–63)
AST: 16 U/L (ref 15–41)
Anion gap: 6 (ref 5–15)
BUN: 37 mg/dL — AB (ref 6–20)
CALCIUM: 8.6 mg/dL — AB (ref 8.9–10.3)
CHLORIDE: 106 mmol/L (ref 101–111)
CO2: 26 mmol/L (ref 22–32)
CREATININE: 2.95 mg/dL — AB (ref 0.61–1.24)
GFR calc non Af Amer: 21 mL/min — ABNORMAL LOW (ref >60)
GFR, EST AFRICAN AMERICAN: 24 mL/min — AB (ref >60)
GLUCOSE: 137 mg/dL — AB (ref 65–99)
Potassium: 4.6 mmol/L (ref 3.5–5.1)
SODIUM: 138 mmol/L (ref 135–145)
Total Bilirubin: 0.7 mg/dL (ref 0.3–1.2)
Total Protein: 6.4 g/dL — ABNORMAL LOW (ref 6.5–8.1)

## 2015-12-17 LAB — FERRITIN: FERRITIN: 599 ng/mL — AB (ref 24–336)

## 2015-12-17 MED ORDER — PALONOSETRON HCL INJECTION 0.25 MG/5ML
INTRAVENOUS | Status: AC
  Filled 2015-12-17: qty 5

## 2015-12-17 MED ORDER — DEXAMETHASONE SODIUM PHOSPHATE 100 MG/10ML IJ SOLN
10.0000 mg | Freq: Once | INTRAMUSCULAR | Status: AC
  Administered 2015-12-17: 10 mg via INTRAVENOUS
  Filled 2015-12-17: qty 1

## 2015-12-17 MED ORDER — SODIUM CHLORIDE 0.9 % IV SOLN
50.0000 mg/m2 | Freq: Once | INTRAVENOUS | Status: AC
  Administered 2015-12-17: 90 mg via INTRAVENOUS
  Filled 2015-12-17: qty 4.5

## 2015-12-17 MED ORDER — PALONOSETRON HCL INJECTION 0.25 MG/5ML
0.2500 mg | Freq: Once | INTRAVENOUS | Status: AC
  Administered 2015-12-17: 0.25 mg via INTRAVENOUS

## 2015-12-17 MED ORDER — SODIUM CHLORIDE 0.9% FLUSH: 10.0000 mL | INTRAVENOUS | Status: DC | PRN

## 2015-12-17 MED ORDER — SODIUM CHLORIDE 0.9 % IV SOLN
Freq: Once | INTRAVENOUS | Status: AC
Start: 2015-12-17 — End: 2015-12-17
  Administered 2015-12-17: 12:00:00 via INTRAVENOUS

## 2015-12-17 MED ORDER — SODIUM CHLORIDE 0.9 % IV SOLN
242.0000 mg | Freq: Once | INTRAVENOUS | Status: AC
  Administered 2015-12-17: 240 mg via INTRAVENOUS
  Filled 2015-12-17: qty 24

## 2015-12-17 NOTE — Assessment & Plan Note (Signed)
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  Oncology Flowsheet 07/18/2015 07/25/2015  ferumoxytol (FERAHEME) IV 510 mg 510 mg    Labs today: CBC diff, iron/TIBC, ferritin  Labs in 6 weeks: CBC diff, iron/TIBC, ferritin

## 2015-12-17 NOTE — Patient Instructions (Signed)
[place] at [facility] Discharge Instructions  RECOMMENDATIONS MADE BY THE CONSULTANT AND ANY TEST RESULTS WILL BE SENT TO YOUR REFERRING PHYSICIAN.    Thank you for choosing [place] at [facility] to provide your oncology and hematology care.  To afford each patient quality time with our provider, please arrive at least 15 minutes before your scheduled appointment time.   Beginning January 23rd 2017 lab work for the [facility] will be done in the  Main lab at [facility] on 1st floor. If you have a lab appointment with the [place] please come in thru the  Main Entrance and check in at the main information desk  You need to re-schedule your appointment should you arrive 10 or more minutes late.  We strive to give you quality time with our providers, and arriving late affects you and other patients whose appointments are after yours.  Also, if you no show three or more times for appointments you may be dismissed from the clinic at the providers discretion.     Again, thank you for choosing [facility].  Our hope is that these requests will decrease the amount of time that you wait before being seen by our physicians.       _____________________________________________________________  Should you have questions after your visit to [facility], please contact our office at [phone] between the hours of 8:30 a.m. and 4:30 p.m.  Voicemails left after 4:30 p.m. will not be returned until the following business day.  For prescription refill requests, have your pharmacy contact our office.         Resources For Cancer Patients and their Caregivers ? American Cancer Society: Can assist with transportation, wigs, general needs, runs Look Good Feel Better.        8601267701 ? Cancer Care: Provides financial assistance, online support groups, medication/co-pay assistance.  1-800-813-HOPE 531-741-0124) ? [place] Assists [place] cancer patients and their families through emotional , educational and financial support.  [phone] ? [place] DSS Where to apply for food stamps, Medicaid and utility assistance. 551-187-8572 ? RCATS: Transportation to medical appointments. 312 269 4285 ? Social Security Administration: May apply for disability if have a Stage IV cancer. 807-343-7366 [phone] ? [employer], Disability and Transit Services: Assists with nutrition, care and transit needs. [place] Support Programs: '@10RELATIVEDAYS'$ @ > Cancer Support Group  2nd Tuesday of the month 1pm-2pm, Journey Room  > Creative Journey  3rd Tuesday of the month 1130am-1pm, Journey Room  > Look Good Feel Better  1st Wednesday of the month 10am-12 noon, Journey Room (Call [place] to register [phone])

## 2015-12-17 NOTE — Assessment & Plan Note (Addendum)
Extensive stage small cell lung cancer status post VATS with endobronchial ultrasound and mediastinal endoscopy by Dr. Roxan Hockey on 10/13/2015 resulting in aforementioned diagnosis.  Began palliative Carboplatin/Etoposide on 10/16/2015.  Oncology history updated.  Patients with extended stage disease, the median survival is 8 to 13 months, and the five-year survival rate is 1 to 2 percent.  In most series, less than 5 percent of those with Extensive Stage SCLC survive beyond two years.  Prophylactic cranial irradiation decreases the incidence of symptomatic brain metastases in patients who have responded to systemic chemotherapy, although its impact on overall survival is uncertain. For patients with a response to systemic chemotherapy, thoracic radiation may be of benefit in increasing the percentage of long-term survivors.   DNR   Pre-treatment labs today: CBC diff, CMET, Magnesium.  I personally reviewed and went over laboratory results with the patient.  The results are noted within this dictation.  CT CAP performed on 12/15/2015 demonstrates a positive response to treatment thus far.  I personally reviewed and went over radiographic studies with the patient.  The results are noted within this dictation.    He has refused brain imaging to complete the staging process secondary to claustrophobia and, I suspect, his fear related to the potential of positive exam for intracranial metastases.  He is asymptomatic.  He reports that he believes in [place] and he is healed.  "By his stripes, I am healed."  He declines imaging of brain.    Return in 21 days for follow-up and cycle #5 of treatment.  At follow-up appointment, we can order and get him scheduled for restaging tests 4-6 weeks out from cycle #6.  He would benefit from WBRT.

## 2015-12-17 NOTE — Progress Notes (Signed)
Eisen R Gilbert Tolerated chemotherapy well today Discharged via [facility]er

## 2015-12-17 NOTE — Progress Notes (Signed)
[name], [place] [address]  Small cell lung cancer, unspecified laterality ([place])  Iron deficiency anemia due to chronic blood loss - Plan: Iron and TIBC, Ferritin  DNR (do not resuscitate)  CURRENT THERAPY: Carboplatin/Etoposide (dose-reduced based upon renal function) and IV iron replacement in the past and we will continue to do so.  INTERVAL HISTORY: [name] 65 y.o. male returns for followup of Extensive stage small cell lung cancer (based upon bilateral pulmonary involvement radiographically without biopsy of left lung nodules) beginning Carboplatin/Etoposide beginning on 10/16/2015. 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     Small cell lung cancer ([place])   09/26/2015 Initial Diagnosis Small cell lung cancer ([place])   09/26/2015 Imaging CT CAP- Extensive malignancy in the thorax. Large right hilar/mediastinal masslike soft tissue density causing encasement and narrowing of the right mainstem bronchus and bronchus intermedius. Discrete mass difficult separate from the extensive multifo..   09/26/2015 - 09/30/2015 Hospital Admission Epigastric pain   09/29/2015 Procedure Bronchoscopy by Dr. Luan Pulling   09/29/2015 Pathology Results Bronchial brushing and washing- BENIGN REACTIVE/REPARATIVE CHANGES.   10/13/2015 Procedure VIDEO BRONCHOSCOPY WITH ENDOBRONCHIAL ULTRASOUND and MEDIASTINOSCOPY by Dr. Roxan Hockey   10/14/2015 Pathology Results 1. Lymph node, biopsy, 2R - METASTATIC SMALL CELL CARCINOMA. 2. Lymph node, biopsy, cervical - ONE BENIGN LYMPH NODE. 3. Lymph node, biopsy, 2R #2 - METASTATIC SMALL CELL CARCINOMA.   10/16/2015 -  Chemotherapy Carboplatin/Etoposide (dose reduced based upon renal function).   10/16/2015 Code Status DNR   10/16/2015 Imaging [place] L upper extremity- No evidence of deep venous thrombosis.   11/05/2015 Imaging [place] of L LE- No evidence of deep venous thrombosis.   12/15/2015 Imaging CT  CAP- Significant interval decrease in confluent soft tissue within the right peritracheal and right hilar region. Interval decrease in size of two spiculated left upper lobe pulmonary nodules. Small pericardial effusion.    Clinically he looks well.  He notes that he is doing well.  His breathing is improved compared to the start of treatment.  He is seen up walking in the exam room.  He denies any active complaints.  We reviewed his scan results today which are improved.  He is given a copy of the report.  His weight is improved.  Review of Systems  Constitutional: Negative for fever, chills and weight loss.  HENT: Negative.  Negative for tinnitus.   Eyes: Negative.  Negative for blurred vision and double vision.  Respiratory: Negative.  Negative for hemoptysis.   Cardiovascular: Negative.   Gastrointestinal: Negative.  Negative for nausea, vomiting, diarrhea and constipation.  Genitourinary: Negative.   Musculoskeletal: Negative.   Skin: Negative.   Neurological: Negative.  Negative for dizziness, sensory change, speech change, focal weakness, seizures, loss of consciousness, weakness and headaches.  Endo/Heme/Allergies: Negative.   Psychiatric/Behavioral: Negative.     Past Medical History  Diagnosis Date  . Diverticulosis 03/04/10  . Peripheral vascular disease, unspecified ([place])   . Undiagnosed cardiac murmurs   . Other symptoms involving cardiovascular system   . HLD (hyperlipidemia)   . HTN (hypertension)   . Type II or unspecified type diabetes mellitus without mention of complication, not stated as uncontrolled   . Ulcer   . Carotid artery occlusion   . Umbilical hernia now    has not been repaired  . Blood transfusion   . AVM (arteriovenous malformation) of colon with hemorrhage   . Gastritis   .  Internal hemorrhoids   . Right carotid bruit   . Shortness of breath     noted /w low Hgb  . GERD (gastroesophageal reflux disease)   . Arthritis     back  . Anemia       8/3,4,5- blood transfusion- [facility], colonoscopy- done & found 2 areas of bleeding   . Tobacco use disorder   . Anxiety   . CHF (congestive heart failure) ([place])     3 yrs ago  . Irregular heart beat   . Iron deficiency anemia due to chronic blood loss 10/07/2010  . Pneumonia April 2017  . Chronic kidney disease     renal failure /w osteomyelitis- 2010  . Chronic renal disease, stage 5, glomerular filtration rate less than or equal to 15 mL/min/1.73 square meter (HCC) 10/07/2010  . Difficulty swallowing solids   . Lung mass 09/26/2015  . DNR (do not resuscitate) 10/16/2015    10/16/2015  . Sacral pressure sore 11/04/2015    Past Surgical History  Procedure Laterality Date  . Colonoscopy  03/04/2010    Dr. Princella Pellegrini AMV without mention of ablation, scattered diverticula, internal hemorrhoids  . Bilateral common superficial femoral and profudus artery endarterectomies      2003    Dr. Sherren Mocha Early  . Esophagogastroduodenoscopy  03/2000    Dr. Tharon Aquas gastritis, H.Pylori gastritis, ?treated  . Esophagogastroduodenoscopy  05/2006    Dr. Marcello Fennel recurrent GI bleed. antral gastritis, single gastric AVM s/p APC, CLO results?  . Colonoscopy  11/2004    Dr. Barkley Bruns  . 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 Both of these are ablated with argon plasma coagulator./ Small external hemorrhoids  . Esophagogastroduodenoscopy  01/21/2012    SLF: MILD TO MAODERATE Gastritis/ SLOW GIB LIKEY DUE TO PT BEING ON ASA ND SUBSEQUENT BLOOD/ LOSS FROM AVMS IN STOMACH AND COLON AS WELL AS GASTRITIS  . Enteroscopy  01/21/2012    Procedure: ENTEROSCOPY;  Surgeon: Danie Binder, MD;  [facility];  Service: Endoscopy;;  . Below knee leg amputation      2010, wears prosthesis   . Endarterectomy  02/09/2012    Procedure: ENDARTERECTOMY  CAROTID;  Surgeon: Rosetta Posner, MD;  Location: [facility];  Service: Vascular;  Laterality: Left;  . Carotid endarterectomy Left 02-09-12  . Esophagogastroduodenoscopy N/A 09/03/2013    Mild chronic gastritis. benign gastric polyps  . [place] capsule study N/A 09/03/2013    Dr. Oneida Alar: small bowel and colonic AVMs. no active bleeding  . Esophagogastroduodenoscopy N/A 08/19/2015    Dr. Oneida Alar: 2 large gastric AVMs in fundus, 1 actively bleeding s/p APC and clip placement, empiric Savary dilation  . Savory dilation N/A 08/19/2015    Procedure: SAVORY DILATION;  Surgeon: Danie Binder, MD;  [facility];  Service: Endoscopy;  Laterality: N/A;  . Flexible bronchoscopy N/A 09/29/2015    Procedure: FLEXIBLE BRONCHOSCOPY;  Surgeon: Sinda Du, MD;  [facility];  Service: Cardiopulmonary;  Laterality: N/A;  . Bronchial brushings  09/29/2015    Procedure: BRONCHIAL BRUSHINGS;  Surgeon: Sinda Du, MD;  [facility];  Service: Cardiopulmonary;;  Tracheal and left lung  . Bronchial washings  09/29/2015    Procedure: BRONCHIAL WASHINGS;  Surgeon: Sinda Du, MD;  [facility];  Service: Cardiopulmonary;;  Bilatersl washings  .  Vascular surgery      rt bka and rt 1st toe amputation  . Video bronchoscopy with endobronchial ultrasound N/A 10/13/2015    Procedure: VIDEO BRONCHOSCOPY WITH ENDOBRONCHIAL ULTRASOUND;  Surgeon: Melrose Nakayama, MD;  Location: [facility];  Service: Thoracic;  Laterality: N/A;  . Mediastinoscopy N/A 10/13/2015    Procedure: MEDIASTINOSCOPY;  Surgeon: Melrose Nakayama, MD;  [facility];  Service: Thoracic;  Laterality: N/A;    Family History  Problem Relation Age of Onset  . Colon cancer Brother 68    deceased  . Hyperlipidemia Brother   . Hypertension Brother   . Lung cancer Sister     deceased, three primary cancers: lung, esophageal, lymphoma.   . Diabetes Sister   . Hypertension Sister   . Cancer Father      gallbladder  . Hyperlipidemia Father   . Hypertension Father   . Cancer Brother     unknown primary  . Cancer Sister     unknown primary  . Hyperlipidemia Sister   . Diabetes Mother   . Hypertension Mother     Social History   Social History  . Marital Status: Married    Spouse Name: N/A  . Number of Children: N/A  . Years of Education: N/A   Social History Main Topics  . Smoking status: Former Smoker -- 2.00 packs/day for 50 years    Types: Cigarettes  . Smokeless tobacco: Former Systems developer    Quit date: 02/08/2012     Comment: Smokes about 2 packs of tiny skinny cigarettes  . Alcohol Use: No  . Drug Use: No  . Sexual Activity: Not on file   Other Topics Concern  . Not on file   Social History Narrative     PHYSICAL EXAMINATION  ECOG PERFORMANCE STATUS: 2 - Symptomatic, <50% confined to bed  Filed Vitals:   12/17/15 0917  BP: 166/84  Pulse: 83  Temp: 97.5 F (36.4 C)  Resp: 20    GENERAL:alert, no distress, comfortable, cooperative, smiling and chronically ill appearing, in a chemo-recliner, accompanied by his wife. SKIN: skin color, texture, turgor are normal, no rashes or significant lesions. HEAD: Normocephalic, No masses, lesions, tenderness or abnormalities EYES: normal, EOMI, Conjunctiva are pink and non-injected EARS: External ears normal OROPHARYNX:lips, buccal mucosa, and tongue normal and mucous membranes are moist  NECK: supple, trachea midline LYMPH:  no palpable lymphadenopathy BREAST:not examined LUNGS: clear to auscultation  HEART: regular rate & rhythm ABDOMEN:abdomen soft and normal bowel sounds BACK: Back symmetric, no curvature. EXTREMITIES:less then 2 second capillary refill, no skin discoloration, no cyanosis NEURO: alert & oriented x 3 with fluent speech, no focal motor/sensory deficits   LABORATORY DATA: CBC    Component Value Date/Time   WBC 18.7* 12/17/2015 0930   RBC 3.44* 12/17/2015 0930   RBC 3.51* 09/14/2013 1430   HGB  10.4* 12/17/2015 0930   HGB 8.0* 09/13/2013 0840   HCT 31.6* 12/17/2015 0930   PLT 195 12/17/2015 0930   MCV 91.9 12/17/2015 0930   MCH 30.2 12/17/2015 0930   MCHC 32.9 12/17/2015 0930   RDW 18.8* 12/17/2015 0930   LYMPHSABS 1.3 12/17/2015 0930   MONOABS 1.2* 12/17/2015 0930   EOSABS 0.1 12/17/2015 0930   BASOSABS 0.1 12/17/2015 0930      Chemistry      Component Value Date/Time   NA 138 12/17/2015 0930   NA 140 05/06/2015 1512   K 4.6 12/17/2015 0930   CL 106 12/17/2015 0930  CO2 26 12/17/2015 0930   BUN 37* 12/17/2015 0930   BUN 34* 05/06/2015 1512   CREATININE 2.95* 12/17/2015 0930   CREATININE 1.59* 12/06/2012 0852      Component Value Date/Time   CALCIUM 8.6* 12/17/2015 0930   CALCIUM 8.4* 09/03/2015 1230   ALKPHOS 163* 12/17/2015 0930   AST 16 12/17/2015 0930   ALT 11* 12/17/2015 0930   BILITOT 0.7 12/17/2015 0930   BILITOT 0.2 05/06/2015 1512      Lab Results  Component Value Date   IRON 73 12/17/2015   TIBC 241* 12/17/2015   FERRITIN 599* 12/17/2015     PENDING LABS:   RADIOGRAPHIC STUDIES:  Ct Abdomen Pelvis Wo Contrast  12/15/2015  CLINICAL DATA:  Patient with history of small cell lung carcinoma status post chemotherapy. EXAM: CT CHEST, ABDOMEN AND PELVIS WITHOUT CONTRAST TECHNIQUE: Multidetector CT imaging of the chest, abdomen and pelvis was performed following the standard protocol without IV contrast. COMPARISON:  CT chest 09/26/2015; CT abdomen pelvis 09/26/2015. FINDINGS: CT CHEST FINDINGS Mediastinum/Lymph Nodes: No axillary lymphadenopathy. Normal heart size. Small pericardial effusion. Dense coronary arterial vascular calcifications. Aorta main pulmonary artery normal in caliber. Significant interval decrease and conformance right hilar soft tissue extending within the right peritracheal region. This measures 1.2 cm in maximal thickness (image 18; series 2), previously 3.9 cm. Lungs/Pleura: Central airways are patent. Interval decrease in size  of left upper lobe spiculated nodules as follows: 1.7 x 1.2 cm (11; series 6), previously 2.4 x 1.8 cm. 1.4 x 1.3 cm irregular left upper lobe nodule (image 17; series 6), previously 2.1 x 1.6 cm. Unchanged 6 mm left lower lobe pulmonary nodule (image 35; series 6). No pleural effusion or pneumothorax. Musculoskeletal: No aggressive or acute appearing osseous lesions. Multilevel degenerative changes of the thoracic spine. CT ABDOMEN PELVIS FINDINGS Hepatobiliary: Liver is normal in size and contour. Gallbladder is unremarkable. Unchanged calcification left hepatic lobe. Pancreas: Unremarkable Spleen: Unremarkable Adrenals/Urinary Tract: The the right kidney is atrophic. Stable bilateral perinephric fat stranding. No hydronephrosis. Urinary bladder unremarkable. Stomach/Bowel: No abnormal bowel wall thickening or evidence for bowel obstruction. No free fluid or free intraperitoneal air. Normal morphology of the stomach. Vascular/Lymphatic: Normal caliber abdominal aorta. Peripheral calcified atherosclerotic plaque. No retroperitoneal lymphadenopathy. Interval increase in size of 1.4 cm left inguinal lymph node, previously 0.7 cm (image 118; series 2). Interval increase in size of 1.4 cm right inguinal lymph node (image 119; series 2), previously 0.9 cm. Other: Central dystrophic calcifications in the prostate. Seminal vesicle calcifications. Musculoskeletal: Lumbar spine degenerative changes. No aggressive or acute appearing osseous lesions. IMPRESSION: Significant interval decrease in confluent soft tissue within the right peritracheal and right hilar region. Interval decrease in size of two spiculated left upper lobe pulmonary nodules. Small pericardial effusion. Electronically Signed   By: Lovey Newcomer M.D.   On: 12/15/2015 12:56   Ct Chest Wo Contrast  12/15/2015  CLINICAL DATA:  Patient with history of small cell lung carcinoma status post chemotherapy. EXAM: CT CHEST, ABDOMEN AND PELVIS WITHOUT CONTRAST  TECHNIQUE: Multidetector CT imaging of the chest, abdomen and pelvis was performed following the standard protocol without IV contrast. COMPARISON:  CT chest 09/26/2015; CT abdomen pelvis 09/26/2015. FINDINGS: CT CHEST FINDINGS Mediastinum/Lymph Nodes: No axillary lymphadenopathy. Normal heart size. Small pericardial effusion. Dense coronary arterial vascular calcifications. Aorta main pulmonary artery normal in caliber. Significant interval decrease and conformance right hilar soft tissue extending within the right peritracheal region. This measures 1.2 cm in maximal thickness (image 18;  series 2), previously 3.9 cm. Lungs/Pleura: Central airways are patent. Interval decrease in size of left upper lobe spiculated nodules as follows: 1.7 x 1.2 cm (11; series 6), previously 2.4 x 1.8 cm. 1.4 x 1.3 cm irregular left upper lobe nodule (image 17; series 6), previously 2.1 x 1.6 cm. Unchanged 6 mm left lower lobe pulmonary nodule (image 35; series 6). No pleural effusion or pneumothorax. Musculoskeletal: No aggressive or acute appearing osseous lesions. Multilevel degenerative changes of the thoracic spine. CT ABDOMEN PELVIS FINDINGS Hepatobiliary: Liver is normal in size and contour. Gallbladder is unremarkable. Unchanged calcification left hepatic lobe. Pancreas: Unremarkable Spleen: Unremarkable Adrenals/Urinary Tract: The the right kidney is atrophic. Stable bilateral perinephric fat stranding. No hydronephrosis. Urinary bladder unremarkable. Stomach/Bowel: No abnormal bowel wall thickening or evidence for bowel obstruction. No free fluid or free intraperitoneal air. Normal morphology of the stomach. Vascular/Lymphatic: Normal caliber abdominal aorta. Peripheral calcified atherosclerotic plaque. No retroperitoneal lymphadenopathy. Interval increase in size of 1.4 cm left inguinal lymph node, previously 0.7 cm (image 118; series 2). Interval increase in size of 1.4 cm right inguinal lymph node (image 119; series 2),  previously 0.9 cm. Other: Central dystrophic calcifications in the prostate. Seminal vesicle calcifications. Musculoskeletal: Lumbar spine degenerative changes. No aggressive or acute appearing osseous lesions. IMPRESSION: Significant interval decrease in confluent soft tissue within the right peritracheal and right hilar region. Interval decrease in size of two spiculated left upper lobe pulmonary nodules. Small pericardial effusion. Electronically Signed   By: Lovey Newcomer M.D.   On: 12/15/2015 12:56     PATHOLOGY:    ASSESSMENT AND PLAN:  Small cell lung cancer ([place]) Extensive stage small cell lung cancer status post VATS with endobronchial ultrasound and mediastinal endoscopy by Dr. Roxan Hockey on 10/13/2015 resulting in aforementioned diagnosis.  Began palliative Carboplatin/Etoposide on 10/16/2015.  Oncology history updated.  Patients with extended stage disease, the median survival is 8 to 13 months, and the five-year survival rate is 1 to 2 percent.  In most series, less than 5 percent of those with Extensive Stage SCLC survive beyond two years.  Prophylactic cranial irradiation decreases the incidence of symptomatic brain metastases in patients who have responded to systemic chemotherapy, although its impact on overall survival is uncertain. For patients with a response to systemic chemotherapy, thoracic radiation may be of benefit in increasing the percentage of long-term survivors.   DNR   Pre-treatment labs today: CBC diff, CMET, Magnesium.  I personally reviewed and went over laboratory results with the patient.  The results are noted within this dictation.  CT CAP performed on 12/15/2015 demonstrates a positive response to treatment thus far.  I personally reviewed and went over radiographic studies with the patient.  The results are noted within this dictation.    He has refused brain imaging to complete the staging process secondary to claustrophobia and, I suspect, his fear related  to the potential of positive exam for intracranial metastases.  He is asymptomatic.  He reports that he believes in [place] and he is healed.  "By his stripes, I am healed."  He declines imaging of brain.    Return in 21 days for follow-up and cycle #5 of treatment.  At follow-up appointment, we can order and get him scheduled for restaging tests 4-6 weeks out from cycle #6.  He would benefit from WBRT.   Iron deficiency anemia due to chronic blood loss Iron deficiency anemia secondary to intestinal AVMs requiring IV iron to support counts; complicated by inability of  oral iron to keep up with chronic blood loss perhaps due to malabsorption from use of proton pump inhibitors.  Oncology Flowsheet 07/18/2015 07/25/2015  ferumoxytol (FERAHEME) IV 510 mg 510 mg    Labs today: CBC diff, iron/TIBC, ferritin  Labs in 6 weeks: CBC diff, iron/TIBC, ferritin  DNR (do not resuscitate) DNR, in the setting of extensive stage small cell lung cancer.  Confirmed on 10/16/2015.    ORDERS PLACED FOR THIS ENCOUNTER: Orders Placed This Encounter  Procedures  . Iron and TIBC  . Ferritin    MEDICATIONS PRESCRIBED THIS ENCOUNTER: No orders of the defined types were placed in this encounter.    THERAPY PLAN:  Continue with palliative treatment as planned.  Will restaging him with CT imaging following cycle #6.  All questions were answered. The patient knows to call the clinic with any problems, questions or concerns. We can certainly see the patient much sooner if necessary.  Patient and plan discussed with Dr. Ancil Linsey and she is in agreement with the aforementioned.   This note is electronically signed by: Doy Mince 12/17/2015 6:31 PM

## 2015-12-17 NOTE — Assessment & Plan Note (Signed)
DNR, in the setting of extensive stage small cell lung cancer.  Confirmed on 10/16/2015.

## 2015-12-17 NOTE — Patient Instructions (Signed)
[facility] Discharge Instructions for Patients Receiving Chemotherapy   Beginning January 23rd 2017 lab work for the [facility] will be done in the  Main lab at [facility] on 1st floor. If you have a lab appointment with the [facility] please come in thru the  Main Entrance and check in at the main information desk   Today you received the following chemotherapy agents carbo and VP-16  Follow up as scheduled  Please call the clinic if you have any questions or concerns   To help prevent nausea and vomiting after your treatment, we encourage you to take your nausea medication.   If you develop nausea and vomiting, or diarrhea that is not controlled by your medication, call the clinic.  The clinic phone number is [phone]. Office hours are Monday-Friday 8:30am-5:00pm.  BELOW ARE SYMPTOMS THAT SHOULD BE REPORTED IMMEDIATELY:  *FEVER GREATER THAN 101.0 F  *CHILLS WITH OR WITHOUT FEVER  NAUSEA AND VOMITING THAT IS NOT CONTROLLED WITH YOUR NAUSEA MEDICATION  *UNUSUAL SHORTNESS OF BREATH  *UNUSUAL BRUISING OR BLEEDING  TENDERNESS IN MOUTH AND THROAT WITH OR WITHOUT PRESENCE OF ULCERS  *URINARY PROBLEMS  *BOWEL PROBLEMS  UNUSUAL RASH Items with * indicate a potential emergency and should be followed up as soon as possible. If you have an emergency after office hours please contact your primary care physician or go to the nearest emergency department.  Please call the clinic during office hours if you have any questions or concerns.   You may also contact the Patient Navigator at [phone] should you have any questions or need assistance in obtaining follow up care.      Resources For Cancer Patients and their Caregivers ? American Cancer Society: Can assist with transportation, wigs, general needs, runs Look Good Feel Better.        (217)015-0429 ? Cancer Care: Provides financial assistance, online support groups, medication/co-pay  assistance.  1-800-813-HOPE 442 322 6214) ? [place] Assists [place] cancer patients and their families through emotional , educational and financial support.  315-828-6883 ? [place] DSS Where to apply for food stamps, Medicaid and utility assistance. 859-802-0487 ? [facility]: Transportation to medical appointments. 5403294118 ? Social Security Administration: May apply for disability if have a Stage IV cancer. 862-082-4610 801-801-4899 ? [employer], Disability and Transit Services: Assists with nutrition, care and transit needs. 810-177-6514

## 2015-12-18 ENCOUNTER — Encounter (HOSPITAL_COMMUNITY): Payer: Self-pay

## 2015-12-18 ENCOUNTER — Ambulatory Visit (HOSPITAL_COMMUNITY): Payer: Medicare Other | Admitting: Hematology & Oncology

## 2015-12-18 ENCOUNTER — Inpatient Hospital Stay (HOSPITAL_COMMUNITY): Payer: Medicare Other

## 2015-12-18 ENCOUNTER — Encounter (HOSPITAL_BASED_OUTPATIENT_CLINIC_OR_DEPARTMENT_OTHER): Payer: Medicare Other

## 2015-12-18 VITALS — BP 172/69 | HR 85 | Temp 97.9°F | Resp 18

## 2015-12-18 DIAGNOSIS — Z5111 Encounter for antineoplastic chemotherapy: Secondary | ICD-10-CM

## 2015-12-18 DIAGNOSIS — C3491 Malignant neoplasm of unspecified part of right bronchus or lung: Secondary | ICD-10-CM

## 2015-12-18 MED ORDER — SODIUM CHLORIDE 0.9 % IV SOLN
10.0000 mg | Freq: Once | INTRAVENOUS | Status: AC
  Administered 2015-12-18: 10 mg via INTRAVENOUS
  Filled 2015-12-18: qty 1

## 2015-12-18 MED ORDER — SODIUM CHLORIDE 0.9 % IV SOLN
Freq: Once | INTRAVENOUS | Status: AC
  Administered 2015-12-18: 14:00:00 via INTRAVENOUS

## 2015-12-18 MED ORDER — SODIUM CHLORIDE 0.9% FLUSH: 10.0000 mL | INTRAVENOUS | Status: DC | PRN

## 2015-12-18 MED ORDER — SODIUM CHLORIDE 0.9 % IV SOLN
50.0000 mg/m2 | Freq: Once | INTRAVENOUS | Status: AC
  Administered 2015-12-18: 90 mg via INTRAVENOUS
  Filled 2015-12-18: qty 4.5

## 2015-12-18 NOTE — Patient Instructions (Signed)
[facility] Discharge Instructions for Patients Receiving Chemotherapy   Beginning January 23rd 2017 lab work for the Cancer Center will be done in the  Main lab at [place] on 1st floor. If you have a lab appointment with the Cancer Center please come in thru the  Main Entrance and check in at the main information desk   Today you received the following chemotherapy agents VP-16.  To help prevent nausea and vomiting after your treatment, we encourage you to take your nausea medication If you develop nausea and vomiting, or diarrhea that is not controlled by your medication, call the clinic.  The clinic phone number is [phone]. Office hours are Monday-Friday 8:30am-5:00pm.  BELOW ARE SYMPTOMS THAT SHOULD BE REPORTED IMMEDIATELY:  *FEVER GREATER THAN 101.0 F  *CHILLS WITH OR WITHOUT FEVER  NAUSEA AND VOMITING THAT IS NOT CONTROLLED WITH YOUR NAUSEA MEDICATION  *UNUSUAL SHORTNESS OF BREATH  *UNUSUAL BRUISING OR BLEEDING  TENDERNESS IN MOUTH AND THROAT WITH OR WITHOUT PRESENCE OF ULCERS  *URINARY PROBLEMS  *BOWEL PROBLEMS  UNUSUAL RASH Items with * indicate a potential emergency and should be followed up as soon as possible. If you have an emergency after office hours please contact your primary care physician or go to the nearest emergency department.  Please call the clinic during office hours if you have any questions or concerns.   You may also contact the Patient Navigator at [phone] should you have any questions or need assistance in obtaining follow up care.      Resources For Cancer Patients and their Caregivers ? American Cancer Society: Can assist with transportation, wigs, general needs, runs Look Good Feel Better.        1-888-227-6333 ? Cancer Care: Provides financial assistance, online support groups, medication/co-pay assistance.  1-800-813-HOPE (4673) ? [facility] Assists [place] cancer  patients and their families through emotional , educational and financial support.  336-427-4357 ? [place] DSS Where to apply for food stamps, Medicaid and utility assistance. 336-342-1394 ? RCATS: Transportation to medical appointments. 336-347-2287 ? Social Security Administration: May apply for disability if have a Stage IV cancer. 336-342-7796 1-800-772-1213 ? [place] Aging, Disability and Transit Services: Assists with nutrition, care and transit needs. 336-349-2343          

## 2015-12-18 NOTE — Progress Notes (Signed)
Cash R Umeda Tolerated chemotherapy well today Discharged via walker

## 2015-12-19 ENCOUNTER — Encounter (HOSPITAL_COMMUNITY): Payer: Self-pay

## 2015-12-19 ENCOUNTER — Inpatient Hospital Stay (HOSPITAL_COMMUNITY): Payer: Medicare Other

## 2015-12-19 ENCOUNTER — Encounter (HOSPITAL_BASED_OUTPATIENT_CLINIC_OR_DEPARTMENT_OTHER): Payer: Medicare Other

## 2015-12-19 VITALS — BP 173/56 | HR 80 | Temp 98.4°F | Resp 18 | Wt 152.2 lb

## 2015-12-19 DIAGNOSIS — Z5111 Encounter for antineoplastic chemotherapy: Secondary | ICD-10-CM | POA: Diagnosis not present

## 2015-12-19 DIAGNOSIS — C3490 Malignant neoplasm of unspecified part of unspecified bronchus or lung: Secondary | ICD-10-CM

## 2015-12-19 DIAGNOSIS — Z5189 Encounter for other specified aftercare: Secondary | ICD-10-CM

## 2015-12-19 DIAGNOSIS — C3491 Malignant neoplasm of unspecified part of right bronchus or lung: Secondary | ICD-10-CM

## 2015-12-19 MED ORDER — PEGFILGRASTIM 6 MG/0.6ML ~~LOC~~ PSKT
6.0000 mg | PREFILLED_SYRINGE | Freq: Once | SUBCUTANEOUS | Status: AC
  Administered 2015-12-19: 6 mg via SUBCUTANEOUS
  Filled 2015-12-19: qty 0.6

## 2015-12-19 MED ORDER — SODIUM CHLORIDE 0.9% FLUSH: 10.0000 mL | INTRAVENOUS | Status: DC | PRN

## 2015-12-19 MED ORDER — SODIUM CHLORIDE 0.9 % IV SOLN
Freq: Once | INTRAVENOUS | Status: AC
  Administered 2015-12-19: 14:00:00 via INTRAVENOUS

## 2015-12-19 MED ORDER — SODIUM CHLORIDE 0.9 % IV SOLN
10.0000 mg | Freq: Once | INTRAVENOUS | Status: AC
  Administered 2015-12-19: 10 mg via INTRAVENOUS
  Filled 2015-12-19: qty 1

## 2015-12-19 MED ORDER — SODIUM CHLORIDE 0.9 % IV SOLN
50.0000 mg/m2 | Freq: Once | INTRAVENOUS | Status: AC
  Administered 2015-12-19: 90 mg via INTRAVENOUS
  Filled 2015-12-19: qty 4.5

## 2015-12-19 NOTE — Progress Notes (Signed)
Nain R Eades Tolerated chemotherapy well today Discharged via walker   .Marland KitchenWonda Cheng Autwellpresents today for neulasta OBI placement per MD orders. OBI device filled per protocol and placed on right Upper Arm. Needle/catheter placement noted prior to patient leaving. Tolerated without incident and aware of injection to be delivered in  27 hours.

## 2015-12-19 NOTE — Patient Instructions (Signed)
[facility] Discharge Instructions for Patients Receiving Chemotherapy   Beginning January 23rd 2017 lab work for the [facility] will be done in the  Main lab at [facility] on 1st floor. If you have a lab appointment with the [facility] please come in thru the  Main Entrance and check in at the main information desk   Today you received the following chemotherapy agents vp-16  To help prevent nausea and vomiting after your treatment, we encourage you to take your nausea medication    If you develop nausea and vomiting, or diarrhea that is not controlled by your medication, call the clinic.  The clinic phone number is [phone]. Office hours are Monday-Friday 8:30am-5:00pm.  BELOW ARE SYMPTOMS THAT SHOULD BE REPORTED IMMEDIATELY:  *FEVER GREATER THAN 101.0 F  *CHILLS WITH OR WITHOUT FEVER  NAUSEA AND VOMITING THAT IS NOT CONTROLLED WITH YOUR NAUSEA MEDICATION  *UNUSUAL SHORTNESS OF BREATH  *UNUSUAL BRUISING OR BLEEDING  TENDERNESS IN MOUTH AND THROAT WITH OR WITHOUT PRESENCE OF ULCERS  *URINARY PROBLEMS  *BOWEL PROBLEMS  UNUSUAL RASH Items with * indicate a potential emergency and should be followed up as soon as possible. If you have an emergency after office hours please contact your primary care physician or go to the nearest emergency department.  Please call the clinic during office hours if you have any questions or concerns.   You may also contact the Patient Navigator at [phone] should you have any questions or need assistance in obtaining follow up care.      Resources For Cancer Patients and their Caregivers ? American Cancer Society: Can assist with transportation, wigs, general needs, runs Look Good Feel Better.        (762) 184-9760 ? Cancer Care: Provides financial assistance, online support groups, medication/co-pay assistance.  1-800-813-HOPE 475-316-0919) ? [place] Assists [place] cancer  patients and their families through emotional , educational and financial support.  970-691-9266 ? [place] DSS Where to apply for food stamps, Medicaid and utility assistance. 301-256-7223 ? RCATS: Transportation to medical appointments. 440 317 6692 ? Social Security Administration: May apply for disability if have a Stage IV cancer. 817-862-0739 708-243-0816 ? [employer], Disability and Transit Services: Assists with nutrition, care and transit needs. [phone]

## 2015-12-25 ENCOUNTER — Other Ambulatory Visit: Payer: Self-pay | Admitting: Family

## 2015-12-26 NOTE — Telephone Encounter (Signed)
Lasix isn't on his active med list. I do see it in the history but wanted to check prior to approving. Ok to refill?

## 2016-01-05 DIAGNOSIS — R918 Other nonspecific abnormal finding of lung field: Secondary | ICD-10-CM | POA: Diagnosis not present

## 2016-01-05 DIAGNOSIS — I5020 Unspecified systolic (congestive) heart failure: Secondary | ICD-10-CM | POA: Diagnosis not present

## 2016-01-07 ENCOUNTER — Encounter (HOSPITAL_COMMUNITY): Payer: Self-pay | Admitting: Emergency Medicine

## 2016-01-07 ENCOUNTER — Encounter (HOSPITAL_COMMUNITY): Payer: Self-pay

## 2016-01-07 ENCOUNTER — Encounter (HOSPITAL_BASED_OUTPATIENT_CLINIC_OR_DEPARTMENT_OTHER): Payer: Medicare Other

## 2016-01-07 ENCOUNTER — Other Ambulatory Visit (HOSPITAL_COMMUNITY): Payer: Self-pay | Admitting: Oncology

## 2016-01-07 ENCOUNTER — Encounter (HOSPITAL_COMMUNITY): Payer: Self-pay | Admitting: Oncology

## 2016-01-07 ENCOUNTER — Encounter (HOSPITAL_COMMUNITY): Payer: Medicare Other | Attending: Hematology & Oncology | Admitting: Hematology & Oncology

## 2016-01-07 ENCOUNTER — Observation Stay (HOSPITAL_COMMUNITY)
Admission: AD | Admit: 2016-01-07 | Discharge: 2016-01-07 | Disposition: A | Discharge status: Home / Self Care | Payer: Medicare Other | Source: Ambulatory Visit | Attending: Nephrology | Admitting: Nephrology

## 2016-01-07 VITALS — BP 150/60 | HR 87 | Temp 98.0°F | Resp 20 | Wt 153.2 lb

## 2016-01-07 DIAGNOSIS — Z8679 Personal history of other diseases of the circulatory system: Secondary | ICD-10-CM

## 2016-01-07 DIAGNOSIS — T451X5A Adverse effect of antineoplastic and immunosuppressive drugs, initial encounter: Principal | ICD-10-CM

## 2016-01-07 DIAGNOSIS — D6481 Anemia due to antineoplastic chemotherapy: Secondary | ICD-10-CM

## 2016-01-07 DIAGNOSIS — F17211 Nicotine dependence, cigarettes, in remission: Secondary | ICD-10-CM

## 2016-01-07 DIAGNOSIS — D6489 Other specified anemias: Secondary | ICD-10-CM | POA: Diagnosis present

## 2016-01-07 DIAGNOSIS — D500 Iron deficiency anemia secondary to blood loss (chronic): Secondary | ICD-10-CM | POA: Diagnosis not present

## 2016-01-07 DIAGNOSIS — Q2733 Arteriovenous malformation of digestive system vessel: Secondary | ICD-10-CM | POA: Diagnosis not present

## 2016-01-07 DIAGNOSIS — I132 Hypertensive heart and chronic kidney disease with heart failure and with stage 5 chronic kidney disease, or end stage renal disease: Secondary | ICD-10-CM | POA: Insufficient documentation

## 2016-01-07 DIAGNOSIS — M1990 Unspecified osteoarthritis, unspecified site: Secondary | ICD-10-CM | POA: Diagnosis not present

## 2016-01-07 DIAGNOSIS — Z66 Do not resuscitate: Secondary | ICD-10-CM | POA: Diagnosis not present

## 2016-01-07 DIAGNOSIS — R5383 Other fatigue: Secondary | ICD-10-CM | POA: Diagnosis present

## 2016-01-07 DIAGNOSIS — Z8774 Personal history of (corrected) congenital malformations of heart and circulatory system: Secondary | ICD-10-CM | POA: Insufficient documentation

## 2016-01-07 DIAGNOSIS — I509 Heart failure, unspecified: Secondary | ICD-10-CM | POA: Insufficient documentation

## 2016-01-07 DIAGNOSIS — E1122 Type 2 diabetes mellitus with diabetic chronic kidney disease: Secondary | ICD-10-CM | POA: Insufficient documentation

## 2016-01-07 DIAGNOSIS — C3491 Malignant neoplasm of unspecified part of right bronchus or lung: Principal | ICD-10-CM | POA: Insufficient documentation

## 2016-01-07 DIAGNOSIS — C3401 Malignant neoplasm of right main bronchus: Secondary | ICD-10-CM

## 2016-01-07 DIAGNOSIS — I10 Essential (primary) hypertension: Secondary | ICD-10-CM

## 2016-01-07 DIAGNOSIS — E785 Hyperlipidemia, unspecified: Secondary | ICD-10-CM | POA: Diagnosis not present

## 2016-01-07 DIAGNOSIS — Z79899 Other long term (current) drug therapy: Secondary | ICD-10-CM | POA: Insufficient documentation

## 2016-01-07 DIAGNOSIS — Z87891 Personal history of nicotine dependence: Secondary | ICD-10-CM | POA: Insufficient documentation

## 2016-01-07 DIAGNOSIS — Z5111 Encounter for antineoplastic chemotherapy: Secondary | ICD-10-CM

## 2016-01-07 DIAGNOSIS — N184 Chronic kidney disease, stage 4 (severe): Secondary | ICD-10-CM | POA: Diagnosis present

## 2016-01-07 DIAGNOSIS — E119 Type 2 diabetes mellitus without complications: Secondary | ICD-10-CM

## 2016-01-07 DIAGNOSIS — N185 Chronic kidney disease, stage 5: Secondary | ICD-10-CM | POA: Diagnosis not present

## 2016-01-07 DIAGNOSIS — C3490 Malignant neoplasm of unspecified part of unspecified bronchus or lung: Secondary | ICD-10-CM | POA: Diagnosis present

## 2016-01-07 DIAGNOSIS — D649 Anemia, unspecified: Secondary | ICD-10-CM | POA: Diagnosis present

## 2016-01-07 DIAGNOSIS — Z89519 Acquired absence of unspecified leg below knee: Secondary | ICD-10-CM

## 2016-01-07 DIAGNOSIS — S88119A Complete traumatic amputation at level between knee and ankle, unspecified lower leg, initial encounter: Secondary | ICD-10-CM

## 2016-01-07 HISTORY — DX: Adverse effect of antineoplastic and immunosuppressive drugs, initial encounter: D64.81

## 2016-01-07 HISTORY — DX: Adverse effect of antineoplastic and immunosuppressive drugs, initial encounter: T45.1X5A

## 2016-01-07 LAB — COMPREHENSIVE METABOLIC PANEL
ALK PHOS: 144 U/L — AB (ref 38–126)
ALT: 9 U/L — AB (ref 17–63)
ANION GAP: 6 (ref 5–15)
AST: 14 U/L — ABNORMAL LOW (ref 15–41)
Albumin: 2.9 g/dL — ABNORMAL LOW (ref 3.5–5.0)
BILIRUBIN TOTAL: 0.7 mg/dL (ref 0.3–1.2)
BUN: 46 mg/dL — ABNORMAL HIGH (ref 6–20)
CALCIUM: 8 mg/dL — AB (ref 8.9–10.3)
CO2: 26 mmol/L (ref 22–32)
CREATININE: 3.47 mg/dL — AB (ref 0.61–1.24)
Chloride: 104 mmol/L (ref 101–111)
GFR calc non Af Amer: 17 mL/min — ABNORMAL LOW (ref >60)
GFR, EST AFRICAN AMERICAN: 20 mL/min — AB (ref >60)
GLUCOSE: 170 mg/dL — AB (ref 65–99)
Potassium: 4.6 mmol/L (ref 3.5–5.1)
Sodium: 136 mmol/L (ref 135–145)
TOTAL PROTEIN: 5.8 g/dL — AB (ref 6.5–8.1)

## 2016-01-07 LAB — CBC WITH DIFFERENTIAL/PLATELET
Basophils Absolute: 0 10*3/uL (ref 0.0–0.1)
Basophils Relative: 0 %
Eosinophils Absolute: 0.1 10*3/uL (ref 0.0–0.7)
Eosinophils Relative: 1 %
HEMATOCRIT: 18.9 % — AB (ref 39.0–52.0)
HEMOGLOBIN: 6 g/dL — AB (ref 13.0–17.0)
LYMPHS ABS: 0.8 10*3/uL (ref 0.7–4.0)
LYMPHS PCT: 8 %
MCH: 31.7 pg (ref 26.0–34.0)
MCHC: 31.7 g/dL (ref 30.0–36.0)
MCV: 100 fL (ref 78.0–100.0)
MONOS PCT: 8 %
Monocytes Absolute: 0.8 10*3/uL (ref 0.1–1.0)
NEUTROS ABS: 8.9 10*3/uL — AB (ref 1.7–7.7)
NEUTROS PCT: 84 %
Platelets: 225 10*3/uL (ref 150–400)
RBC: 1.89 MIL/uL — AB (ref 4.22–5.81)
RDW: 20.9 % — ABNORMAL HIGH (ref 11.5–15.5)
WBC: 10.6 10*3/uL — ABNORMAL HIGH (ref 4.0–10.5)

## 2016-01-07 LAB — GLUCOSE, CAPILLARY
Glucose-Capillary: 225 mg/dL — ABNORMAL HIGH (ref 65–99)
Glucose-Capillary: 322 mg/dL — ABNORMAL HIGH (ref 65–99)

## 2016-01-07 LAB — PREPARE RBC (CROSSMATCH)

## 2016-01-07 MED ORDER — PANTOPRAZOLE SODIUM 40 MG PO TBEC
40.0000 mg | DELAYED_RELEASE_TABLET | Freq: Every day | ORAL | Status: DC
  Administered 2016-01-07: 40 mg via ORAL
  Filled 2016-01-07: qty 1

## 2016-01-07 MED ORDER — HYDROCODONE-ACETAMINOPHEN 5-325 MG PO TABS: 1.0000 | ORAL_TABLET | ORAL | Status: DC | PRN

## 2016-01-07 MED ORDER — ONDANSETRON HCL 4 MG PO TABS: 4.0000 mg | ORAL_TABLET | Freq: Four times a day (QID) | ORAL | Status: DC | PRN

## 2016-01-07 MED ORDER — SODIUM CHLORIDE 0.9 % IV SOLN
224.5000 mg | Freq: Once | INTRAVENOUS | Status: AC
  Administered 2016-01-07: 220 mg via INTRAVENOUS
  Filled 2016-01-07: qty 22

## 2016-01-07 MED ORDER — SODIUM CHLORIDE 0.9 % IV SOLN
50.0000 mg/m2 | Freq: Once | INTRAVENOUS | Status: AC
  Administered 2016-01-07: 90 mg via INTRAVENOUS
  Filled 2016-01-07: qty 4.5

## 2016-01-07 MED ORDER — GLIPIZIDE 5 MG PO TABS: 10.0000 mg | ORAL_TABLET | Freq: Every day | ORAL | Status: DC

## 2016-01-07 MED ORDER — ATENOLOL 25 MG PO TABS: 50.0000 mg | ORAL_TABLET | Freq: Two times a day (BID) | ORAL | Status: DC

## 2016-01-07 MED ORDER — POLYETHYLENE GLYCOL 3350 17 G PO PACK: 17.0000 g | PACK | Freq: Every day | ORAL | Status: DC | PRN

## 2016-01-07 MED ORDER — IRBESARTAN 150 MG PO TABS
150.0000 mg | ORAL_TABLET | Freq: Every day | ORAL | Status: DC
  Administered 2016-01-07: 150 mg via ORAL
  Filled 2016-01-07: qty 1

## 2016-01-07 MED ORDER — FUROSEMIDE 40 MG PO TABS
40.0000 mg | ORAL_TABLET | Freq: Every day | ORAL | Status: DC
  Administered 2016-01-07: 40 mg via ORAL
  Filled 2016-01-07: qty 1

## 2016-01-07 MED ORDER — DIPHENHYDRAMINE HCL 25 MG PO CAPS
25.0000 mg | ORAL_CAPSULE | Freq: Once | ORAL | Status: AC
Start: 2016-01-07 — End: 2016-01-07
  Administered 2016-01-07: 25 mg via ORAL
  Filled 2016-01-07: qty 1

## 2016-01-07 MED ORDER — ONDANSETRON HCL 4 MG/2ML IJ SOLN: 4.0000 mg | Freq: Four times a day (QID) | INTRAMUSCULAR | Status: DC | PRN

## 2016-01-07 MED ORDER — ACETAMINOPHEN 325 MG PO TABS
650.0000 mg | ORAL_TABLET | Freq: Once | ORAL | Status: AC
  Administered 2016-01-07: 650 mg via ORAL
  Filled 2016-01-07: qty 2

## 2016-01-07 MED ORDER — SODIUM POLYSTYRENE SULFONATE 15 GM/60ML PO SUSP: 30.0000 g | ORAL | Status: DC

## 2016-01-07 MED ORDER — SODIUM CHLORIDE 0.9 % IV SOLN
250.0000 mL | Freq: Once | INTRAVENOUS | Status: DC
  Administered 2016-01-07: 250 mL via INTRAVENOUS

## 2016-01-07 MED ORDER — SODIUM CHLORIDE 0.9% FLUSH: 10.0000 mL | INTRAVENOUS | Status: DC | PRN

## 2016-01-07 MED ORDER — DEXAMETHASONE SODIUM PHOSPHATE 100 MG/10ML IJ SOLN
10.0000 mg | Freq: Once | INTRAMUSCULAR | Status: AC
  Administered 2016-01-07: 10 mg via INTRAVENOUS
  Filled 2016-01-07: qty 1

## 2016-01-07 MED ORDER — PALONOSETRON HCL INJECTION 0.25 MG/5ML
0.2500 mg | Freq: Once | INTRAVENOUS | Status: AC
  Administered 2016-01-07: 0.25 mg via INTRAVENOUS
  Filled 2016-01-07: qty 5

## 2016-01-07 MED ORDER — ACETAMINOPHEN 650 MG RE SUPP
650.0000 mg | Freq: Four times a day (QID) | RECTAL | Status: DC | PRN
Start: 2016-01-07 — End: 2016-01-08

## 2016-01-07 MED ORDER — DARBEPOETIN ALFA 500 MCG/ML IJ SOSY
500.0000 ug | PREFILLED_SYRINGE | Freq: Once | INTRAMUSCULAR | Status: AC
  Administered 2016-01-07: 500 ug via SUBCUTANEOUS
  Filled 2016-01-07: qty 1

## 2016-01-07 MED ORDER — CLONIDINE HCL 0.2 MG PO TABS: 0.2000 mg | ORAL_TABLET | Freq: Two times a day (BID) | ORAL | Status: DC

## 2016-01-07 MED ORDER — ACETAMINOPHEN 325 MG PO TABS: 650.0000 mg | ORAL_TABLET | Freq: Four times a day (QID) | ORAL | Status: DC | PRN

## 2016-01-07 MED ORDER — SODIUM CHLORIDE 0.9% FLUSH: 3.0000 mL | INTRAVENOUS | Status: DC | PRN

## 2016-01-07 MED ORDER — SODIUM CHLORIDE 0.9 % IV SOLN
Freq: Once | INTRAVENOUS | Status: AC
  Administered 2016-01-07: 12:00:00 via INTRAVENOUS

## 2016-01-07 MED ORDER — SODIUM CHLORIDE 0.9% FLUSH: 3.0000 mL | Freq: Two times a day (BID) | INTRAVENOUS | Status: DC

## 2016-01-07 MED ORDER — AMLODIPINE BESYLATE 5 MG PO TABS
10.0000 mg | ORAL_TABLET | Freq: Every day | ORAL | Status: DC
  Administered 2016-01-07: 10 mg via ORAL
  Filled 2016-01-07: qty 2

## 2016-01-07 MED ORDER — SODIUM CHLORIDE 0.9 % IV SOLN: 250.0000 mL | INTRAVENOUS | Status: DC | PRN

## 2016-01-07 MED ORDER — GLIPIZIDE 5 MG PO TABS
5.0000 mg | ORAL_TABLET | Freq: Every evening | ORAL | Status: DC
Start: 2016-01-07 — End: 2016-01-08
  Administered 2016-01-07: 5 mg via ORAL
  Filled 2016-01-07: qty 1

## 2016-01-07 NOTE — Progress Notes (Signed)
[name] tolerated chemo tx well. Accompanied pt ambulatory to 3rd floor for observation/blood transfusion after report given to [facility].

## 2016-01-07 NOTE — H&P (Signed)
[facility] History and Physical  RAYE SLYTER GTX:[id number] DOB: 1951/03/22 DOA: 01/07/2016  Referring physician: Dr Whitney Muse PCP: Chevis Pretty, FNP   Chief Complaint: Fatigue , low Hb  HPI: [name] is a 65 y.o. 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  Pt is getting chemoRx (Carbo/ Etoposide) and is getting round #5 or 6 cycles today, tomorrow and the next day per Dr. Whitney Muse.  Labs from today showed Hb 6.0, patient admitted for blood transfusion.  Has hx of AVM's and chron GI blood loss, as well as Fe def anemia but doesn't tolerate po iron per ONC notes.    Patient is w/o complaints other than increased fatigue.  No DOE, SOB/ CP, no f/c/s, no n/v, no abd pain or voiding issues.  No joitn pain , HA or focal weakness.  No bloody stool, having brown stool.  Normal BM's.    Cancer presented w progressive wt loss and dysphagia, found to have large mediastinal LAN compressing esophagus.  Dysphagia has resolved since starting chemo.    Had GIB in Feb 2017 w gastric AVM's, underwent APC Rx and clip placement.  Pt is DNR per ONC notes.      ROS  denies CP  no joint pain   no HA  no blurry vision  no rash  no diarrhea  no nausea/ vomiting  no dysuria  no difficulty voiding  no change in urine color    Past Medical History  Past Medical History  Diagnosis Date  . Diverticulosis 03/04/10  . Peripheral vascular disease, unspecified ([place])   . Undiagnosed cardiac murmurs   . Other symptoms involving cardiovascular system   . HLD (hyperlipidemia)   . HTN (hypertension)   . Type II or unspecified type diabetes mellitus without mention of complication, not stated as uncontrolled   . Ulcer   . Carotid artery occlusion   . Umbilical hernia now    has not been repaired  . Blood transfusion   . AVM (arteriovenous malformation) of colon with hemorrhage   . Gastritis   .  Internal hemorrhoids   . Right carotid bruit   . Shortness of breath     noted /w low Hgb  . GERD (gastroesophageal reflux disease)   . Arthritis     back  . Anemia     8/3,4,5- blood transfusion- [facility], colonoscopy- done & found 2 areas of bleeding   . Tobacco use disorder   . Anxiety   . CHF (congestive heart failure) ([place])     3 yrs ago  . Irregular heart beat   . Iron deficiency anemia due to chronic blood loss 10/07/2010  . Pneumonia April 2017  . Chronic kidney disease     renal failure /w osteomyelitis- 2010  . Chronic renal disease, stage 5, glomerular filtration rate less than or equal to 15 mL/min/1.73 square meter (HCC) 10/07/2010  . Difficulty swallowing solids   . Lung mass 09/26/2015  . DNR (do not resuscitate) 10/16/2015    10/16/2015  . Sacral pressure sore 11/04/2015  . Antineoplastic chemotherapy induced anemia 01/07/2016   Past Surgical History  Past Surgical History  Procedure Laterality Date  . Colonoscopy  03/04/2010    Dr. Princella Pellegrini AMV without mention of ablation, scattered diverticula, internal hemorrhoids  . Bilateral common superficial femoral and profudus artery endarterectomies      2003  Dr. Sherren Mocha Early  . Esophagogastroduodenoscopy  03/2000    Dr. Tharon Aquas gastritis, H.Pylori gastritis, ?treated  . Esophagogastroduodenoscopy  05/2006    Dr. Marcello Fennel recurrent GI bleed. antral gastritis, single gastric AVM s/p APC, CLO results?  . Colonoscopy  11/2004    Dr. Barkley Bruns  . 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 Both of these are ablated with argon plasma coagulator./ Small external hemorrhoids  . Esophagogastroduodenoscopy  01/21/2012    SLF: MILD TO MAODERATE Gastritis/ SLOW GIB LIKEY DUE TO PT BEING ON ASA ND SUBSEQUENT BLOOD/ LOSS FROM AVMS IN STOMACH AND COLON AS WELL AS GASTRITIS   . Enteroscopy  01/21/2012    Procedure: ENTEROSCOPY;  Surgeon: Danie Binder, MD;  [facility];  Service: Endoscopy;;  . Below knee leg amputation      2010, wears prosthesis   . Endarterectomy  02/09/2012    Procedure: ENDARTERECTOMY CAROTID;  Surgeon: Rosetta Posner, MD;  Location: [place];  Service: Vascular;  Laterality: Left;  . Carotid endarterectomy Left 02-09-12  . Esophagogastroduodenoscopy N/A 09/03/2013    Mild chronic gastritis. benign gastric polyps  . Givens capsule study N/A 09/03/2013    Dr. Oneida Alar: small bowel and colonic AVMs. no active bleeding  . Esophagogastroduodenoscopy N/A 08/19/2015    Dr. Oneida Alar: 2 large gastric AVMs in fundus, 1 actively bleeding s/p APC and clip placement, empiric Savary dilation  . Savory dilation N/A 08/19/2015    Procedure: SAVORY DILATION;  Surgeon: Danie Binder, MD;  [facility];  Service: Endoscopy;  Laterality: N/A;  . Flexible bronchoscopy N/A 09/29/2015    Procedure: FLEXIBLE BRONCHOSCOPY;  Surgeon: Sinda Du, MD;  [facility];  Service: Cardiopulmonary;  Laterality: N/A;  . Bronchial brushings  09/29/2015    Procedure: BRONCHIAL BRUSHINGS;  Surgeon: Sinda Du, MD;  [facility];  Service: Cardiopulmonary;;  Tracheal and left lung  . Bronchial washings  09/29/2015    Procedure: BRONCHIAL WASHINGS;  Surgeon: Sinda Du, MD;  [facility];  Service: Cardiopulmonary;;  Bilatersl washings  . Vascular surgery      rt bka and rt 1st toe amputation  . Video bronchoscopy with endobronchial ultrasound N/A 10/13/2015    Procedure: VIDEO BRONCHOSCOPY WITH ENDOBRONCHIAL ULTRASOUND;  Surgeon: Melrose Nakayama, MD;  Location: [place];  Service: Thoracic;  Laterality: N/A;  . Mediastinoscopy N/A 10/13/2015    Procedure: MEDIASTINOSCOPY;  Surgeon: Melrose Nakayama, MD;  Location: [facility] OR;  Service: Thoracic;  Laterality: N/A;   Family History  Family History  Problem Relation Age of  Onset  . Colon cancer Brother 40    deceased  . Hyperlipidemia Brother   . Hypertension Brother   . Lung cancer Sister     deceased, three primary cancers: lung, esophageal, lymphoma.   . Diabetes Sister   . Hypertension Sister   . Cancer Father     gallbladder  . Hyperlipidemia Father   . Hypertension Father   . Cancer Brother     unknown primary  . Cancer Sister     unknown primary  . Hyperlipidemia Sister   . Diabetes Mother   . Hypertension Mother    Social History  reports that he has quit smoking. His smoking use included Cigarettes. He has a 100 pack-year smoking history. He quit smokeless tobacco  use about 3 years ago. He reports that he does not drink alcohol or use illicit drugs. Allergies  Allergies  Allergen Reactions  . Zocor [Simvastatin] Palpitations    Chest soreness  . Asa [Aspirin] Other (See Comments)    GI Bleed  . Doxycycline Diarrhea  . Hctz [Hydrochlorothiazide]     Leg cramps  . Iodine Hives    IVP dye  . Sulfa Antibiotics Other (See Comments)    Unknown    Home medications Prior to Admission medications   Medication Sig Start Date End Date Taking? Authorizing Provider  acetaminophen (TYLENOL) 500 MG tablet Take 1,000 mg by mouth daily as needed for mild pain or headache.    Historical Provider, MD  amLODipine (NORVASC) 10 MG tablet TAKE 1 TABLET BY MOUTH DAILY, BEFORE BREAKFAST 11/13/15   [name] Done, FNP  atenolol (TENORMIN) 100 MG tablet TAKE 1/2 TABLET (50 MG TOTAL) BY MOUTH TWICE  DAILY. 10/14/15   Donielle Liston Alba, PA-C  Calcium Carbonate Antacid (TUMS ULTRA 1000 PO) Take 2 tablets by mouth daily as needed (indigestion).     Historical Provider, MD  CARBOPLATIN IV Inject into the vein. Day 1 every 21 days 10/15/15   Historical Provider, MD  cloNIDine (CATAPRES) 0.2 MG tablet TAKE 1 TABLET BY MOUTH TWICE A DAY 12/01/15   [name] Done, FNP  cloNIDine (CATAPRES) 0.2 MG tablet TAKE 1 TABLET BY MOUTH TWICE A DAY 12/03/15    [name] Done, FNP  diazepam (VALIUM) 5 MG tablet Take 5 mg by mouth daily. Reported on 11/05/2015 10/07/15   Historical Provider, MD  ETOPOSIDE IV Inject into the vein. Days 1-3 every 21 days 10/15/15   Historical Provider, MD  furosemide (LASIX) 40 MG tablet TAKE 1 TABLET (40 MG TOTAL) BY MOUTH AS NEEDED. 12/26/15   [name] Done, FNP  gabapentin (NEURONTIN) 300 MG capsule Take 1 capsule (300 mg total) by mouth daily as needed (nerve pain). 10/14/15   Donielle Liston Alba, PA-C  gabapentin (NEURONTIN) 300 MG capsule TAKE 1 CAPSULE (300 MG TOTAL) BY MOUTH 3 (THREE) TIMES DAILY. 11/28/15   [name] Done, FNP  glipiZIDE (GLUCOTROL) 10 MG tablet TAKE 1 TABLET IN THE MORNING AND 1/2 TABLET IN THE EVENING 10/13/15   [name] Done, FNP  guaifenesin (HUMIBID E) 400 MG TABS tablet Take 1 tablet (400 mg total) by mouth every 6 (six) hours. 10/11/15   April Palumbo, MD  HYDROcodone-acetaminophen (NORCO/VICODIN) 5-325 MG tablet Take 1 tablet by mouth every 4 (four) hours as needed. 11/26/15   Patrici Ranks, MD  omeprazole (PRILOSEC) 40 MG capsule Take 1 capsule (40 mg total) by mouth daily. 10/16/15   Baird Cancer, PA-C  ondansetron (ZOFRAN) 4 MG tablet Take 1 tablet (4 mg total) by mouth 4 (four) times daily -  before meals and at bedtime. 10/14/15   Donielle Liston Alba, PA-C  ondansetron (ZOFRAN) 8 MG tablet Take 1 tablet (8 mg total) by mouth every 8 (eight) hours as needed for nausea or vomiting. 10/13/15   Patrici Ranks, MD  pantoprazole (PROTONIX) 40 MG tablet Take 1 tablet by mouth daily. 07/15/15   Historical Provider, MD  Pegfilgrastim (NEULASTA ONPRO [place]) Inject into the skin. To be given 27 hours after the completion of chemo (every 21 days) 10/17/15   Historical Provider, MD  polyethylene glycol (MIRALAX / GLYCOLAX) packet Take 17 g by mouth daily as needed for mild constipation (*tAKES [place]).     Historical Provider, MD  predniSONE (DELTASONE) 20  MG tablet  Take on po daily 10/14/15   Donielle Liston Alba, PA-C  prochlorperazine (COMPAZINE) 10 MG tablet Take 1 tablet (10 mg total) by mouth every 6 (six) hours as needed for nausea or vomiting. 10/13/15   Patrici Ranks, MD  silver sulfADIAZINE (SILVADENE) 1 % cream APPLY TO AFFECTED AREA TWICE A DAY Patient taking differently: Apply 1 application topically 2 (two) times daily as needed (sore skin).  03/14/14   [name] Done, FNP  sodium polystyrene (KAYEXALATE) 15 GM/60ML suspension Take 120 mLs (30 g total) by mouth once a week. 11/05/15   Baird Cancer, PA-C  sodium polystyrene (KAYEXALATE) powder Take by mouth once. Take 30 grams weekly 11/05/15   Manon Hilding Kefalas, PA-C  valsartan (DIOVAN) 160 MG tablet Take 1 tablet (160 mg total) by mouth daily. 09/18/15   [name] Done, FNP  Vitamin D, Ergocalciferol, (DRISDOL) 50000 units CAPS capsule Take 50,000 Units by mouth once a week. Takes on Saturdays. 08/10/15   Historical Provider, MD   Liver Function Tests  Recent Labs Lab 01/07/16 0934  AST 14*  ALT 9*  ALKPHOS 144*  BILITOT 0.7  PROT 5.8*  ALBUMIN 2.9*   No results for input(s): LIPASE, AMYLASE in the last 168 hours. CBC  Recent Labs Lab 01/07/16 0934  WBC 10.6*  NEUTROABS 8.9*  HGB 6.0*  HCT 18.9*  MCV 100.0  PLT 245   Basic Metabolic Panel  Recent Labs Lab 01/07/16 0934  NA 136  K 4.6  CL 104  CO2 26  GLUCOSE 170*  BUN 46*  CREATININE 3.47*  CALCIUM 8.0*     Filed Vitals:   01/07/16 1619  BP: 155/62  Pulse: 88  Temp: 97.6 F (36.4 C)  TempSrc: Oral  Resp: 20  Height: 5' 1" (1.549 m)  Weight: 69.4 kg (153 lb)  SpO2: 95%   Exam: Gen elderly pleasant WM no distress No rash, cyanosis or gangrene Sclera anicteric, throat clear  No jvd or bruits Chest clear bilat RRR no MRG Abd soft ntnd no mass or ascites +bs GU normal male MS R BKA w prosthesis in place Ext trace LLE edema / no wounds or ulcers Neuro is alert, Ox 3 , nf   Na 136  K  4.6   CO2 26  BUN 46  Creat 3.47   Ca 8   eGFR 17  Alb 2.9 LFT's ok  TProt 5.8  Tbili 0.7 WBC 10k   Hb 6.0  Hct 18%   plt 225     Assessment: 1.  Anemia - in patient w known extensive stage SCLC, hx of AVM"s treated in Feb.  Prob chronic GI blood loss + Fe def, +/- chemo. Admitted for transfusion per ONC.  Is to be dc'd home after transfusion. 2.  ES-SCLC - getting chemoRx, on round #5/6 today/ tomorrow/ Friday 3.  DM 2 oral agents, long standing 25 yrs 4.  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  HTN on mult BP meds and lasix, continue 6.  DNR  Plan - as above, dc after prbc's in      [facility] Pager [phone]  Cell [phone]  If 7PM-7AM, please contact night-coverage [email or web address] Password [email or web address] 01/07/2016, 5:01 PM

## 2016-01-07 NOTE — Patient Instructions (Signed)
[facility] Discharge Instructions for Patients Receiving Chemotherapy   Beginning January 23rd 2017 lab work for the [facility] will be done in the  Main lab at [facility] on 1st floor. If you have a lab appointment with the [facility] please come in thru the  Main Entrance and check in at the main information desk   Today you received the following chemotherapy agents Carboplatin and VP-16 as well as Aranesp injection.Pt admitted for observation for blood transfusion. Pt to return to clinic for chemo tomorrow.   To help prevent nausea and vomiting after your treatment, we encourage you to take your nausea medication.   If you develop nausea and vomiting, or diarrhea that is not controlled by your medication, call the clinic.  The clinic phone number is [phone]. Office hours are Monday-Friday 8:30am-5:00pm.  BELOW ARE SYMPTOMS THAT SHOULD BE REPORTED IMMEDIATELY:  *FEVER GREATER THAN 101.0 F  *CHILLS WITH OR WITHOUT FEVER  NAUSEA AND VOMITING THAT IS NOT CONTROLLED WITH YOUR NAUSEA MEDICATION  *UNUSUAL SHORTNESS OF BREATH  *UNUSUAL BRUISING OR BLEEDING  TENDERNESS IN MOUTH AND THROAT WITH OR WITHOUT PRESENCE OF ULCERS  *URINARY PROBLEMS  *BOWEL PROBLEMS  UNUSUAL RASH Items with * indicate a potential emergency and should be followed up as soon as possible. If you have an emergency after office hours please contact your primary care physician or go to the nearest emergency department.  Please call the clinic during office hours if you have any questions or concerns.   You may also contact the Patient Navigator at [phone] should you have any questions or need assistance in obtaining follow up care.      Resources For Cancer Patients and their Caregivers ? American Cancer Society: Can assist with transportation, wigs, general needs, runs Look Good Feel Better.        (205) 350-2772 ? Cancer Care: Provides financial assistance, online  support groups, medication/co-pay assistance.  1-800-813-HOPE 816-701-5623) ? [place] Assists [place] cancer patients and their families through emotional , educational and financial support.  (442) 147-3744 ? [place] DSS Where to apply for food stamps, Medicaid and utility assistance. 309-571-5762 ? [facility]: Transportation to medical appointments. 838 382 6135 ? Social Security Administration: May apply for disability if have a Stage IV cancer. [phone] [phone] ? [employer], Disability and Transit Services: Assists with nutrition, care and transit needs. [phone]

## 2016-01-07 NOTE — Progress Notes (Signed)
[name], [place] [id number]  No diagnosis found.  REASON FOR VISIT:    Small cell lung cancer ([place])   09/26/2015 Initial Diagnosis Small cell lung cancer ([place])   09/26/2015 Imaging CT CAP- Extensive malignancy in the thorax. Large right hilar/mediastinal masslike soft tissue density causing encasement and narrowing of the right mainstem bronchus and bronchus intermedius. Discrete mass difficult separate from the extensive multifo..   09/26/2015 - 09/30/2015 Hospital Admission Epigastric pain   09/29/2015 Procedure Bronchoscopy by Dr. Luan Pulling   09/29/2015 Pathology Results Bronchial brushing and washing- BENIGN REACTIVE/REPARATIVE CHANGES.   10/13/2015 Procedure VIDEO BRONCHOSCOPY WITH ENDOBRONCHIAL ULTRASOUND and MEDIASTINOSCOPY by Dr. Roxan Hockey   10/14/2015 Pathology Results 1. Lymph node, biopsy, 2R - METASTATIC SMALL CELL CARCINOMA. 2. Lymph node, biopsy, cervical - ONE BENIGN LYMPH NODE. 3. Lymph node, biopsy, 2R #2 - METASTATIC SMALL CELL CARCINOMA.   10/16/2015 -  Chemotherapy Carboplatin/Etoposide (dose reduced based upon renal function).   10/16/2015 Code Status DNR   10/16/2015 Imaging [place] L upper extremity- No evidence of deep venous thrombosis.   11/05/2015 Imaging [place] of L LE- No evidence of deep venous thrombosis.   12/15/2015 Imaging CT CAP- Significant interval decrease in confluent soft tissue within the right peritracheal and right hilar region. Interval decrease in size of two spiculated left upper lobe pulmonary nodules. Small pericardial effusion.   History of Iron Deficiency   INTERVAL HISTORY: [name] 65 y.o. male returns for followup of Extensive Stage SCLC. 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   [name] is accompanied by his wife and presents in treatment chair. He is here for treatment today. I personally reviewed and went over laboratory studies with the patient,  including low counts.  Initially states he does not want to come into the hospital overnight to receive blood. He is worried whether the hospital will let him go so he can come for treatment tomorrow. His wife notes that if he receives blood he will want to leave right after it is completed.  He denies blood in stool and describes stool as brown in color. He has been eating well. He knew his blood counts were low, as he could hear his heartbeat in his head. He did not contact us because he knew he was returning soon. He has also felt very tired. His wife reports she called to have labs drawn last week and was told he could not.  He denies CP.   He is still not interested in pursuing WBRT. No other complaints today.   Past Medical History  Diagnosis Date  . Diverticulosis 03/04/10  . Peripheral vascular disease, unspecified ([place])   . Undiagnosed cardiac murmurs   . Other symptoms involving cardiovascular system   . HLD (hyperlipidemia)   . HTN (hypertension)   . Type II or unspecified type diabetes mellitus without mention of complication, not stated as uncontrolled   . Ulcer   . Carotid artery occlusion   . Umbilical hernia now    has not been repaired  . Blood transfusion   . AVM (arteriovenous malformation) of colon with hemorrhage   . Gastritis   . Internal hemorrhoids   . Right carotid bruit   . Shortness of breath     noted /w low Hgb  . GERD (gastroesophageal reflux disease)   . Arthritis     back  . Anemia  8/3,4,5- blood transfusion- [facility], colonoscopy- done & found 2 areas of bleeding   . Tobacco use disorder   . Anxiety   . CHF (congestive heart failure) ([place])     3 yrs ago  . Irregular heart beat   . Iron deficiency anemia due to chronic blood loss 10/07/2010  . Pneumonia April 2017  . Chronic kidney disease     renal failure /w osteomyelitis- 2010  . Chronic renal disease, stage 5, glomerular filtration rate less than or equal to 15 mL/min/1.73 square meter  (HCC) 10/07/2010  . Difficulty swallowing solids   . Lung mass 09/26/2015  . DNR (do not resuscitate) 10/16/2015    10/16/2015  . 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[place]); Diabetic peripheral neuropathy ([place]); Iron deficiency anemia due to chronic blood loss; Noncompliance; Occlusion and stenosis of carotid artery without mention of cerebral infarction; GI bleeding; Hyponatremia; Acute renal failure ([place]); Aftercare following surgery of the circulatory system, NEC; AVM (arteriovenous malformation) of colon with hemorrhage; GERD (gastroesophageal reflux disease); Anxiety; CHF (congestive heart failure) (HCC); BPH (benign prostatic hyperplasia); Nausea without vomiting; Abdominal bloating; Dyspepsia; Dysphagia; Anemia; Constipation; Acute renal failure superimposed on stage 4 chronic kidney disease ([place]); Weakness; Mass of oral cavity; Left arm swelling; Hyperkalemia; Absolute anemia; Pneumonia; Small cell lung cancer ([place]); DNR (do not resuscitate); and Sacral pressure sore on his problem list.     is allergic to zocor; asa; doxycycline; hctz; iodine; and sulfa antibiotics.  Mr. Nomura does not currently have medications on file.  Past Surgical History  Procedure Laterality Date  . Colonoscopy  03/04/2010    Dr. Princella Pellegrini AMV without mention of ablation, scattered diverticula, internal hemorrhoids  . Bilateral common superficial femoral and profudus artery endarterectomies      2003    Dr. Sherren Mocha Early  . Esophagogastroduodenoscopy  03/2000    Dr. Tharon Aquas gastritis, H.Pylori gastritis, ?treated  . Esophagogastroduodenoscopy  05/2006    Dr. Marcello Fennel  recurrent GI bleed. antral gastritis, single gastric AVM s/p APC, CLO results?  . Colonoscopy  11/2004    Dr. Barkley Bruns  . 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 Both of these are ablated with argon plasma coagulator./ Small external hemorrhoids  . Esophagogastroduodenoscopy  01/21/2012    SLF: MILD TO MAODERATE Gastritis/ SLOW GIB LIKEY DUE TO PT BEING ON ASA ND SUBSEQUENT BLOOD/ LOSS FROM AVMS IN STOMACH AND COLON AS WELL AS GASTRITIS  . Enteroscopy  01/21/2012    Procedure: ENTEROSCOPY;  Surgeon: Danie Binder, MD;  [facility];  Service: Endoscopy;;  . Below knee leg amputation      2010, wears prosthesis   . Endarterectomy  02/09/2012    Procedure: ENDARTERECTOMY CAROTID;  Surgeon: Rosetta Posner, MD;  Location: [facility];  Service: Vascular;  Laterality: Left;  . Carotid endarterectomy Left 02-09-12  . Esophagogastroduodenoscopy N/A 09/03/2013    Mild chronic gastritis. benign gastric polyps  . Givens capsule study N/A 09/03/2013    Dr. Oneida Alar: small bowel and colonic AVMs. no active bleeding  . Esophagogastroduodenoscopy N/A 08/19/2015  Dr. Oneida Alar: 2 large gastric AVMs in fundus, 1 actively bleeding s/p APC and clip placement, empiric Savary dilation  . Savory dilation N/A 08/19/2015    Procedure: SAVORY DILATION;  Surgeon: Danie Binder, MD;  [facility];  Service: Endoscopy;  Laterality: N/A;  . Flexible bronchoscopy N/A 09/29/2015    Procedure: FLEXIBLE BRONCHOSCOPY;  Surgeon: Sinda Du, MD;  [facility];  Service: Cardiopulmonary;  Laterality: N/A;  . Bronchial brushings  09/29/2015    Procedure: BRONCHIAL BRUSHINGS;  Surgeon: Sinda Du, MD;  [facility];  Service: Cardiopulmonary;;  Tracheal and left lung  . Bronchial washings  09/29/2015    Procedure: BRONCHIAL WASHINGS;   Surgeon: Sinda Du, MD;  [facility];  Service: Cardiopulmonary;;  Bilatersl washings  . Vascular surgery      rt bka and rt 1st toe amputation  . Video bronchoscopy with endobronchial ultrasound N/A 10/13/2015    Procedure: VIDEO BRONCHOSCOPY WITH ENDOBRONCHIAL ULTRASOUND;  Surgeon: Melrose Nakayama, MD;  Location: [facility];  Service: Thoracic;  Laterality: N/A;  . Mediastinoscopy N/A 10/13/2015    Procedure: MEDIASTINOSCOPY;  Surgeon: Melrose Nakayama, MD;  Location: [facility];  Service: Thoracic;  Laterality: N/A;   REVIEW OF SYSTEMS: Denies any headaches, dizziness, double vision, fevers, chills, night sweats, nausea, vomiting, diarrhea, constipation, chest pain, heart palpitations, shortness of breath, blood in stool, black tarry stool, urinary pain, urinary burning, urinary frequency, hematuria. Positive for fatigue. Tired and has no energy. 14 point review of systems was performed and is negative except as detailed under history of present illness and above    PHYSICAL EXAMINATION  ECOG PERFORMANCE STATUS: 1 - Symptomatic but completely ambulatory  Vitals with BMI 01/07/2016  Height   Weight 153 lbs 3 oz  BMI   Systolic 801  Diastolic 50  Pulse 80  Respirations 20    GENERAL:alert, no distress, well nourished, well developed, comfortable, cooperative, smiling and accompanied by his wife. SKIN: decubitus resolved HEAD: Normocephalic, No masses, lesions, tenderness or abnormalities MOUTH: 1/2 cm growth noted in the posterior aspect of the right tonsillar pillar, no thrush, no mucositis (unchanged) EYES: normal, PERRLA, EOMI, Conjunctiva are pink and non-injected EARS: External ears normal OROPHARYNX:lips, buccal mucosa, and tongue normal and mucous membranes are moist  NECK: supple, thyroid normal size, non-tender, without nodularity, no stridor, non-tender, trachea midline LYMPH:  no palpable lymphadenopathy BREAST:not examined LUNGS: Normal breath  sounds. HEART: regular rate & rhythm, no murmurs, no gallops, S1 normal and S2 normal ABDOMEN:abdomen soft, non-tender, normal bowel sounds and no masses or organomegaly BACK: Back symmetric, no curvature., No CVA tenderness EXTREMITIES:less then 2 second capillary refill, no joint deformities, effusion, or inflammation, no edema, no skin discoloration, Positive findings:  Right BLK amputation.   NEURO: alert & oriented x 3 with fluent speech, no focal motor/sensory deficits,   LABORATORY DATA: I have reviewed the data as listed.  Results for EZRA, DENNE (MRN [id number]) as of 01/07/2016 11:43  Ref. Range 01/07/2016 09:34  Sodium Latest Ref Range: 135-145 mmol/L 136  Potassium Latest Ref Range: 3.5-5.1 mmol/L 4.6  Chloride Latest Ref Range: 101-111 mmol/L 104  CO2 Latest Ref Range: 22-32 mmol/L 26  BUN Latest Ref Range: 6-20 mg/dL 46 (H)  Creatinine Latest Ref Range: 0.61-1.24 mg/dL 3.47 (H)  Calcium Latest Ref Range: 8.9-10.3 mg/dL 8.0 (L)  EGFR (Non-African Amer.) Latest Ref Range: >60 mL/min 17 (L)  EGFR (African American) Latest Ref Range: >60 mL/min 20 (L)  Glucose Latest Ref Range: 65-99 mg/dL 170 (H)  Anion gap Latest Ref Range: 5-15  6  Alkaline Phosphatase Latest Ref Range: 38-126 U/L 144 (H)  Albumin Latest Ref Range: 3.5-5.0 g/dL 2.9 (L)  AST Latest Ref Range: 15-41 U/L 14 (L)  ALT Latest Ref Range: 17-63 U/L 9 (L)  Total Protein Latest Ref Range: 6.5-8.1 g/dL 5.8 (L)  Total Bilirubin Latest Ref Range: 0.3-1.2 mg/dL 0.7  WBC Latest Ref Range: 4.0-10.5 K/uL 10.6 (H)  RBC Latest Ref Range: 4.22-5.81 MIL/uL 1.89 (L)  Hemoglobin Latest Ref Range: 13.0-17.0 g/dL 6.0 (LL)  HCT Latest Ref Range: 39.0-52.0 % 18.9 (L)  MCV Latest Ref Range: 78.0-100.0 fL 100.0  MCH Latest Ref Range: 26.0-34.0 pg 31.7  MCHC Latest Ref Range: 30.0-36.0 g/dL 31.7  RDW Latest Ref Range: 11.5-15.5 % 20.9 (H)  Platelets Latest Ref Range: 150-400 K/uL 225  Neutrophils Latest Units: % 84   Lymphocytes Latest Units: % 8  Monocytes Relative Latest Units: % 8  Eosinophil Latest Units: % 1  Basophil Latest Units: % 0  NEUT# Latest Ref Range: 1.7-7.7 K/uL 8.9 (H)  Lymphocyte # Latest Ref Range: 0.7-4.0 K/uL 0.8  Monocyte # Latest Ref Range: 0.1-1.0 K/uL 0.8  Eosinophils Absolute Latest Ref Range: 0.0-0.7 K/uL 0.1  Basophils Absolute Latest Ref Range: 0.0-0.1 K/uL 0.0    RADIOLOGY RESULTS: I have personally reviewed the radiological images as listed and agreed with the findings in the report. Study Result     CLINICAL DATA: Patient with history of small cell lung carcinoma status post chemotherapy.  EXAM: CT CHEST, ABDOMEN AND PELVIS WITHOUT CONTRAST  TECHNIQUE: Multidetector CT imaging of the chest, abdomen and pelvis was performed following the standard protocol without IV contrast.  COMPARISON: CT chest 09/26/2015; CT abdomen pelvis 09/26/2015.  FINDINGS: CT CHEST FINDINGS  Mediastinum/Lymph Nodes: No axillary lymphadenopathy. Normal heart size. Small pericardial effusion. Dense coronary arterial vascular calcifications. Aorta main pulmonary artery normal in caliber. Significant interval decrease and conformance right hilar soft tissue extending within the right peritracheal region. This measures 1.2 cm in maximal thickness (image 18; series 2), previously 3.9 cm.  Lungs/Pleura: Central airways are patent. Interval decrease in size of left upper lobe spiculated nodules as follows: 1.7 x 1.2 cm (11; series 6), previously 2.4 x 1.8 cm. 1.4 x 1.3 cm irregular left upper lobe nodule (image 17; series 6), previously 2.1 x 1.6 cm. Unchanged 6 mm left lower lobe pulmonary nodule (image 35; series 6). No pleural effusion or pneumothorax.  Musculoskeletal: No aggressive or acute appearing osseous lesions. Multilevel degenerative changes of the thoracic spine.  CT ABDOMEN PELVIS FINDINGS  Hepatobiliary: Liver is normal in size and contour. Gallbladder  is unremarkable. Unchanged calcification left hepatic lobe.  Pancreas: Unremarkable  Spleen: Unremarkable  Adrenals/Urinary Tract: The the right kidney is atrophic. Stable bilateral perinephric fat stranding. No hydronephrosis. Urinary bladder unremarkable.  Stomach/Bowel: No abnormal bowel wall thickening or evidence for bowel obstruction. No free fluid or free intraperitoneal air. Normal morphology of the stomach.  Vascular/Lymphatic: Normal caliber abdominal aorta. Peripheral calcified atherosclerotic plaque. No retroperitoneal lymphadenopathy. Interval increase in size of 1.4 cm left inguinal lymph node, previously 0.7 cm (image 118; series 2). Interval increase in size of 1.4 cm right inguinal lymph node (image 119; series 2), previously 0.9 cm.  Other: Central dystrophic calcifications in the prostate. Seminal vesicle calcifications.  Musculoskeletal: Lumbar spine degenerative changes. No aggressive or acute appearing osseous lesions.  IMPRESSION: Significant interval decrease in confluent soft tissue within  the right peritracheal and right hilar region.  Interval decrease in size of two spiculated left upper lobe pulmonary nodules.  Small pericardial effusion.   Electronically Signed  By: Lovey Newcomer M.D.  On: 12/15/2015 12:56     PATHOLOGY:    ASSESSMENT AND PLAN:  Intestinal AVM's  Gastric AVM's Iron deficiency anemia secondary to chronic GI related blood loss CKD, Stage V EGD August 19, 2015 with gastric AVMs s/p APC therapy and clip placement LUE swelling, negative Ultrasound on 09/01/2015 Oropharyngeal lesion Extensive Stage SCLC  Decubitus Ulcer Stage I DNR status  He is improved clinically.He wishes to complete current therapy. He has not had brain imaging and at this point is not interested in pursuing. He is also not interested in WBRT. Transportation has been problematic for the patient and his wife in regards to cost.  He  will be admitted for observation to receive 2 U PRBC. I will look into who his wife called last week about coming in for a CBC. I have always advised him that if the patient feels his counts are getting low to call and let us know so we can check his counts.   The patient has quit smoking. He has noticeable respiratory improvement.  He is here for treatment today and will return for etoposide tomorrow.   He will return for follow up in 3 weeks.  All questions were answered. The patient knows to call the clinic with any problems, questions or concerns. We can certainly see the patient much sooner if necessary.  This document serves as a record of services personally performed by Ancil Linsey, MD. It was created on her behalf by Arlyce Harman, a trained medical scribe. The creation of this record is based on the scribe's personal observations and the provider's statements to them. This document has been checked and approved by the attending provider.  I have reviewed the above documentation for accuracy and completeness, and I agree with the above.  This note is electronically signed by:  Molli Hazard, MD  01/07/2016 11:49 AM

## 2016-01-08 ENCOUNTER — Encounter (HOSPITAL_COMMUNITY): Payer: Self-pay

## 2016-01-08 ENCOUNTER — Encounter (HOSPITAL_BASED_OUTPATIENT_CLINIC_OR_DEPARTMENT_OTHER): Payer: Medicare Other

## 2016-01-08 VITALS — BP 156/61 | HR 87 | Temp 97.8°F | Resp 20 | Wt 153.0 lb

## 2016-01-08 DIAGNOSIS — Z5111 Encounter for antineoplastic chemotherapy: Secondary | ICD-10-CM

## 2016-01-08 DIAGNOSIS — C3491 Malignant neoplasm of unspecified part of right bronchus or lung: Secondary | ICD-10-CM | POA: Diagnosis not present

## 2016-01-08 LAB — TYPE AND SCREEN
ABO/RH(D): O POS
ANTIBODY SCREEN: NEGATIVE
Unit division: 0
Unit division: 0

## 2016-01-08 MED ORDER — SODIUM CHLORIDE 0.9 % IV SOLN
50.0000 mg/m2 | Freq: Once | INTRAVENOUS | Status: AC
  Administered 2016-01-08: 90 mg via INTRAVENOUS
  Filled 2016-01-08: qty 4.5

## 2016-01-08 MED ORDER — HEPARIN SOD (PORK) LOCK FLUSH 100 UNIT/ML IV SOLN: 500.0000 [IU] | Freq: Once | INTRAVENOUS | Status: DC | PRN

## 2016-01-08 MED ORDER — SODIUM CHLORIDE 0.9 % IV SOLN
10.0000 mg | Freq: Once | INTRAVENOUS | Status: AC
  Administered 2016-01-08: 10 mg via INTRAVENOUS
  Filled 2016-01-08: qty 1

## 2016-01-08 MED ORDER — SODIUM CHLORIDE 0.9% FLUSH: 10.0000 mL | INTRAVENOUS | Status: DC | PRN

## 2016-01-08 MED ORDER — SODIUM CHLORIDE 0.9 % IV SOLN
Freq: Once | INTRAVENOUS | Status: AC
  Administered 2016-01-08: 14:00:00 via INTRAVENOUS

## 2016-01-08 NOTE — Progress Notes (Signed)
[name] tolerated chemo tx well. Pt discharged ambulatory using walker in satisfactory condition with wife

## 2016-01-08 NOTE — Patient Instructions (Signed)
[facility] Discharge Instructions for Patients Receiving Chemotherapy   Beginning January 23rd 2017 lab work for the Cancer Center will be done in the  Main lab at [facility] on 1st floor. If you have a lab appointment with the Cancer Center please come in thru the  Main Entrance and check in at the main information desk   Today you received the following chemotherapy agents VP-16. Follow-up as scheduled. Call clinic for any questions or concerns  To help prevent nausea and vomiting after your treatment, we encourage you to take your nausea medication   If you develop nausea and vomiting, or diarrhea that is not controlled by your medication, call the clinic.  The clinic phone number is [phone]. Office hours are Monday-Friday 8:30am-5:00pm.  BELOW ARE SYMPTOMS THAT SHOULD BE REPORTED IMMEDIATELY:  *FEVER GREATER THAN 101.0 F  *CHILLS WITH OR WITHOUT FEVER  NAUSEA AND VOMITING THAT IS NOT CONTROLLED WITH YOUR NAUSEA MEDICATION  *UNUSUAL SHORTNESS OF BREATH  *UNUSUAL BRUISING OR BLEEDING  TENDERNESS IN MOUTH AND THROAT WITH OR WITHOUT PRESENCE OF ULCERS  *URINARY PROBLEMS  *BOWEL PROBLEMS  UNUSUAL RASH Items with * indicate a potential emergency and should be followed up as soon as possible. If you have an emergency after office hours please contact your primary care physician or go to the nearest emergency department.  Please call the clinic during office hours if you have any questions or concerns.   You may also contact the Patient Navigator at [phone] should you have any questions or need assistance in obtaining follow up care.      Resources For Cancer Patients and their Caregivers ? American Cancer Society: Can assist with transportation, wigs, general needs, runs Look Good Feel Better.        1-888-227-6333 ? Cancer Care: Provides financial assistance, online support groups, medication/co-pay assistance.  1-800-813-HOPE  (4673) ? [facility] Assists [place] cancer patients and their families through emotional , educational and financial support.  336-427-4357 ? [place] DSS Where to apply for food stamps, Medicaid and utility assistance. 336-342-1394 ? [facility]: Transportation to medical appointments. 336-347-2287 ? Social Security Administration: May apply for disability if have a Stage IV cancer. 336-342-7796 1-800-772-1213 ? [place] Aging, Disability and Transit Services: Assists with nutrition, care and transit needs. 336-349-2343         

## 2016-01-09 ENCOUNTER — Encounter (HOSPITAL_BASED_OUTPATIENT_CLINIC_OR_DEPARTMENT_OTHER): Payer: Medicare Other

## 2016-01-09 VITALS — BP 158/66 | HR 79 | Temp 98.5°F | Resp 18

## 2016-01-09 DIAGNOSIS — Z5189 Encounter for other specified aftercare: Secondary | ICD-10-CM | POA: Diagnosis not present

## 2016-01-09 DIAGNOSIS — Z5111 Encounter for antineoplastic chemotherapy: Secondary | ICD-10-CM | POA: Diagnosis not present

## 2016-01-09 DIAGNOSIS — C3491 Malignant neoplasm of unspecified part of right bronchus or lung: Secondary | ICD-10-CM

## 2016-01-09 MED ORDER — HEPARIN SOD (PORK) LOCK FLUSH 100 UNIT/ML IV SOLN
500.0000 [IU] | Freq: Once | INTRAVENOUS | Status: DC | PRN
Start: 2016-01-09 — End: 2016-01-09

## 2016-01-09 MED ORDER — PEGFILGRASTIM 6 MG/0.6ML ~~LOC~~ PSKT
PREFILLED_SYRINGE | SUBCUTANEOUS | Status: AC
  Filled 2016-01-09: qty 0.6

## 2016-01-09 MED ORDER — ETOPOSIDE CHEMO INJECTION 1 GM/50ML
50.0000 mg/m2 | Freq: Once | INTRAVENOUS | Status: AC
  Administered 2016-01-09: 90 mg via INTRAVENOUS
  Filled 2016-01-09: qty 4.5

## 2016-01-09 MED ORDER — SODIUM CHLORIDE 0.9 % IV SOLN
Freq: Once | INTRAVENOUS | Status: AC
  Administered 2016-01-09: 13:00:00 via INTRAVENOUS

## 2016-01-09 MED ORDER — SODIUM CHLORIDE 0.9% FLUSH: 10.0000 mL | INTRAVENOUS | Status: DC | PRN

## 2016-01-09 MED ORDER — PEGFILGRASTIM 6 MG/0.6ML ~~LOC~~ PSKT
6.0000 mg | PREFILLED_SYRINGE | Freq: Once | SUBCUTANEOUS | Status: AC
  Administered 2016-01-09: 6 mg via SUBCUTANEOUS

## 2016-01-09 MED ORDER — SODIUM CHLORIDE 0.9 % IV SOLN
10.0000 mg | Freq: Once | INTRAVENOUS | Status: AC
  Administered 2016-01-09: 10 mg via INTRAVENOUS
  Filled 2016-01-09: qty 1

## 2016-01-09 NOTE — Progress Notes (Signed)
Patient tolerated infusion well.  VSS.   

## 2016-01-09 NOTE — Patient Instructions (Signed)
[facility] Discharge Instructions for Patients Receiving Chemotherapy   Beginning January 23rd 2017 lab work for the [facility] will be done in the  Main lab at [facility] on 1st floor. If you have a lab appointment with the [facility] please come in thru the  Main Entrance and check in at the main information desk   Today you received the following chemotherapy agent: Etoposide.     If you develop nausea and vomiting, or diarrhea that is not controlled by your medication, call the clinic.  The clinic phone number is [phone]. Office hours are Monday-Friday 8:30am-5:00pm.  BELOW ARE SYMPTOMS THAT SHOULD BE REPORTED IMMEDIATELY:  *FEVER GREATER THAN 101.0 F  *CHILLS WITH OR WITHOUT FEVER  NAUSEA AND VOMITING THAT IS NOT CONTROLLED WITH YOUR NAUSEA MEDICATION  *UNUSUAL SHORTNESS OF BREATH  *UNUSUAL BRUISING OR BLEEDING  TENDERNESS IN MOUTH AND THROAT WITH OR WITHOUT PRESENCE OF ULCERS  *URINARY PROBLEMS  *BOWEL PROBLEMS  UNUSUAL RASH Items with * indicate a potential emergency and should be followed up as soon as possible. If you have an emergency after office hours please contact your primary care physician or go to the nearest emergency department.  Please call the clinic during office hours if you have any questions or concerns.   You may also contact the Patient Navigator at [phone] should you have any questions or need assistance in obtaining follow up care.      Resources For Cancer Patients and their Caregivers ? American Cancer Society: Can assist with transportation, wigs, general needs, runs Look Good Feel Better.        818-077-6289 ? Cancer Care: Provides financial assistance, online support groups, medication/co-pay assistance.  1-800-813-HOPE 720-094-7363) ? [facility] Assists [place] cancer patients and their families through emotional , educational and financial support.   (231)253-1424 ? [place] DSS Where to apply for food stamps, Medicaid and utility assistance. (901) 387-2370 ? [facility]: Transportation to medical appointments. 769-438-4317 ? Social Security Administration: May apply for disability if have a Stage IV cancer. ([phone] 574-076-9120 ? [employer], Disability and Transit Services: Assists with nutrition, care and transit needs. (782)423-4529

## 2016-01-12 MED ORDER — LORAZEPAM 2 MG/ML IJ SOLN
INTRAMUSCULAR | Status: AC
  Filled 2016-01-12: qty 1

## 2016-01-13 ENCOUNTER — Encounter (HOSPITAL_COMMUNITY): Payer: Self-pay

## 2016-01-17 ENCOUNTER — Other Ambulatory Visit: Payer: Self-pay | Admitting: Family

## 2016-01-20 ENCOUNTER — Other Ambulatory Visit (HOSPITAL_COMMUNITY): Payer: Self-pay | Admitting: Oncology

## 2016-01-20 ENCOUNTER — Telehealth: Payer: Self-pay | Admitting: Nurse Practitioner

## 2016-01-20 DIAGNOSIS — D500 Iron deficiency anemia secondary to blood loss (chronic): Secondary | ICD-10-CM

## 2016-01-20 NOTE — Telephone Encounter (Signed)
Patient thinks his hemoglobin is low and would like to come an get it checked tomorrow. States he can feel his heart beat In his ear and when that happens its tomorrow low. Can we put in an order for tomorrow or does patient need to be seen ? Please advise

## 2016-01-20 NOTE — Telephone Encounter (Signed)
Returned wife's phone call in regards to request blood work. Per Suezanne Cheshire orders are in for labs . Appointment set up for tomorrow for labs at 0830. Wife is aware.

## 2016-01-21 ENCOUNTER — Encounter (HOSPITAL_COMMUNITY): Payer: Medicare Other | Attending: Hematology & Oncology

## 2016-01-21 DIAGNOSIS — D500 Iron deficiency anemia secondary to blood loss (chronic): Secondary | ICD-10-CM | POA: Insufficient documentation

## 2016-01-21 LAB — CBC WITH DIFFERENTIAL/PLATELET
BASOS PCT: 0 %
Basophils Absolute: 0 10*3/uL (ref 0.0–0.1)
EOS ABS: 0.1 10*3/uL (ref 0.0–0.7)
EOS PCT: 1 %
HCT: 26.6 % — ABNORMAL LOW (ref 39.0–52.0)
HEMOGLOBIN: 8.5 g/dL — AB (ref 13.0–17.0)
LYMPHS ABS: 0.7 10*3/uL (ref 0.7–4.0)
Lymphocytes Relative: 8 %
MCH: 31.7 pg (ref 26.0–34.0)
MCHC: 32 g/dL (ref 30.0–36.0)
MCV: 99.3 fL (ref 78.0–100.0)
MONOS PCT: 7 %
Monocytes Absolute: 0.7 10*3/uL (ref 0.1–1.0)
NEUTROS PCT: 85 %
Neutro Abs: 8.3 10*3/uL — ABNORMAL HIGH (ref 1.7–7.7)
PLATELETS: 62 10*3/uL — AB (ref 150–400)
RBC: 2.68 MIL/uL — ABNORMAL LOW (ref 4.22–5.81)
RDW: 20.1 % — ABNORMAL HIGH (ref 11.5–15.5)
WBC: 9.7 10*3/uL (ref 4.0–10.5)

## 2016-01-21 LAB — SAMPLE TO BLOOD BANK

## 2016-01-21 LAB — IRON AND TIBC
IRON: 66 ug/dL (ref 45–182)
Saturation Ratios: 33 % (ref 17.9–39.5)
TIBC: 199 ug/dL — ABNORMAL LOW (ref 250–450)
UIBC: 133 ug/dL

## 2016-01-21 LAB — FERRITIN: FERRITIN: 808 ng/mL — AB (ref 24–336)

## 2016-01-22 ENCOUNTER — Other Ambulatory Visit (HOSPITAL_COMMUNITY): Payer: Self-pay | Admitting: *Deleted

## 2016-01-22 DIAGNOSIS — C3490 Malignant neoplasm of unspecified part of unspecified bronchus or lung: Secondary | ICD-10-CM

## 2016-01-23 ENCOUNTER — Encounter (HOSPITAL_COMMUNITY): Payer: Self-pay

## 2016-01-23 ENCOUNTER — Encounter (HOSPITAL_BASED_OUTPATIENT_CLINIC_OR_DEPARTMENT_OTHER): Payer: Medicare Other

## 2016-01-23 ENCOUNTER — Encounter (HOSPITAL_COMMUNITY): Payer: Medicare Other

## 2016-01-23 DIAGNOSIS — D500 Iron deficiency anemia secondary to blood loss (chronic): Secondary | ICD-10-CM

## 2016-01-23 DIAGNOSIS — C3490 Malignant neoplasm of unspecified part of unspecified bronchus or lung: Secondary | ICD-10-CM

## 2016-01-23 DIAGNOSIS — N185 Chronic kidney disease, stage 5: Secondary | ICD-10-CM | POA: Diagnosis not present

## 2016-01-23 DIAGNOSIS — Q2733 Arteriovenous malformation of digestive system vessel: Secondary | ICD-10-CM | POA: Diagnosis not present

## 2016-01-23 LAB — PREPARE RBC (CROSSMATCH)

## 2016-01-23 MED ORDER — ACETAMINOPHEN 325 MG PO TABS
ORAL_TABLET | ORAL | Status: AC
  Filled 2016-01-23: qty 2

## 2016-01-23 MED ORDER — ACETAMINOPHEN 325 MG PO TABS
650.0000 mg | ORAL_TABLET | Freq: Once | ORAL | Status: AC
  Administered 2016-01-23: 650 mg via ORAL

## 2016-01-23 MED ORDER — SODIUM CHLORIDE 0.9% FLUSH: 3.0000 mL | INTRAVENOUS | Status: AC | PRN

## 2016-01-23 MED ORDER — SODIUM CHLORIDE 0.9% FLUSH: 10.0000 mL | INTRAVENOUS | Status: AC | PRN

## 2016-01-23 MED ORDER — DIPHENHYDRAMINE HCL 25 MG PO CAPS
ORAL_CAPSULE | ORAL | Status: AC
  Filled 2016-01-23: qty 1

## 2016-01-23 MED ORDER — DIPHENHYDRAMINE HCL 25 MG PO CAPS: 25.0000 mg | ORAL_CAPSULE | Freq: Once | ORAL | Status: AC

## 2016-01-23 MED ORDER — SODIUM CHLORIDE 0.9 % IV SOLN
250.0000 mL | Freq: Once | INTRAVENOUS | Status: AC
  Administered 2016-01-23: 250 mL via INTRAVENOUS

## 2016-01-24 LAB — TYPE AND SCREEN
ABO/RH(D): O POS
ANTIBODY SCREEN: NEGATIVE
Unit division: 0

## 2016-01-27 DIAGNOSIS — I87332 Chronic venous hypertension (idiopathic) with ulcer and inflammation of left lower extremity: Secondary | ICD-10-CM | POA: Diagnosis not present

## 2016-01-28 ENCOUNTER — Encounter (HOSPITAL_BASED_OUTPATIENT_CLINIC_OR_DEPARTMENT_OTHER): Payer: Medicare Other

## 2016-01-28 ENCOUNTER — Encounter (HOSPITAL_COMMUNITY): Payer: Self-pay

## 2016-01-28 VITALS — BP 138/58 | HR 80 | Temp 97.8°F | Resp 18 | Wt 157.0 lb

## 2016-01-28 DIAGNOSIS — Z5111 Encounter for antineoplastic chemotherapy: Secondary | ICD-10-CM

## 2016-01-28 DIAGNOSIS — D500 Iron deficiency anemia secondary to blood loss (chronic): Secondary | ICD-10-CM | POA: Diagnosis not present

## 2016-01-28 DIAGNOSIS — C3491 Malignant neoplasm of unspecified part of right bronchus or lung: Secondary | ICD-10-CM

## 2016-01-28 DIAGNOSIS — D6481 Anemia due to antineoplastic chemotherapy: Secondary | ICD-10-CM

## 2016-01-28 DIAGNOSIS — T451X5A Adverse effect of antineoplastic and immunosuppressive drugs, initial encounter: Principal | ICD-10-CM

## 2016-01-28 LAB — COMPREHENSIVE METABOLIC PANEL
ALBUMIN: 2.9 g/dL — AB (ref 3.5–5.0)
ALT: 10 U/L — AB (ref 17–63)
ANION GAP: 6 (ref 5–15)
AST: 15 U/L (ref 15–41)
Alkaline Phosphatase: 129 U/L — ABNORMAL HIGH (ref 38–126)
BUN: 51 mg/dL — ABNORMAL HIGH (ref 6–20)
CHLORIDE: 103 mmol/L (ref 101–111)
CO2: 28 mmol/L (ref 22–32)
CREATININE: 3.57 mg/dL — AB (ref 0.61–1.24)
Calcium: 8.1 mg/dL — ABNORMAL LOW (ref 8.9–10.3)
GFR, EST AFRICAN AMERICAN: 19 mL/min — AB (ref >60)
GFR, EST NON AFRICAN AMERICAN: 17 mL/min — AB (ref >60)
Glucose, Bld: 157 mg/dL — ABNORMAL HIGH (ref 65–99)
POTASSIUM: 4.7 mmol/L (ref 3.5–5.1)
SODIUM: 137 mmol/L (ref 135–145)
Total Bilirubin: 0.4 mg/dL (ref 0.3–1.2)
Total Protein: 5.8 g/dL — ABNORMAL LOW (ref 6.5–8.1)

## 2016-01-28 LAB — CBC WITH DIFFERENTIAL/PLATELET
BASOS ABS: 0 10*3/uL (ref 0.0–0.1)
Basophils Relative: 0 %
EOS PCT: 1 %
Eosinophils Absolute: 0.1 10*3/uL (ref 0.0–0.7)
HCT: 27.2 % — ABNORMAL LOW (ref 39.0–52.0)
Hemoglobin: 8.6 g/dL — ABNORMAL LOW (ref 13.0–17.0)
LYMPHS ABS: 0.8 10*3/uL (ref 0.7–4.0)
Lymphocytes Relative: 8 %
MCH: 31.6 pg (ref 26.0–34.0)
MCHC: 31.6 g/dL (ref 30.0–36.0)
MCV: 100 fL (ref 78.0–100.0)
MONO ABS: 0.6 10*3/uL (ref 0.1–1.0)
Monocytes Relative: 6 %
NEUTROS PCT: 85 %
Neutro Abs: 8.8 10*3/uL — ABNORMAL HIGH (ref 1.7–7.7)
PLATELETS: 178 10*3/uL (ref 150–400)
RBC: 2.72 MIL/uL — AB (ref 4.22–5.81)
RDW: 19.3 % — AB (ref 11.5–15.5)
WBC: 10.3 10*3/uL (ref 4.0–10.5)

## 2016-01-28 LAB — IRON AND TIBC
IRON: 40 ug/dL — AB (ref 45–182)
Saturation Ratios: 20 % (ref 17.9–39.5)
TIBC: 196 ug/dL — AB (ref 250–450)
UIBC: 156 ug/dL

## 2016-01-28 LAB — PREPARE RBC (CROSSMATCH)

## 2016-01-28 LAB — FERRITIN: FERRITIN: 550 ng/mL — AB (ref 24–336)

## 2016-01-28 MED ORDER — SODIUM CHLORIDE 0.9% FLUSH: 10.0000 mL | INTRAVENOUS | Status: DC | PRN

## 2016-01-28 MED ORDER — CARBOPLATIN CHEMO INJECTION 450 MG/45ML
221.5000 mg | Freq: Once | INTRAVENOUS | Status: AC
  Administered 2016-01-28: 220 mg via INTRAVENOUS
  Filled 2016-01-28: qty 22

## 2016-01-28 MED ORDER — PALONOSETRON HCL INJECTION 0.25 MG/5ML
0.2500 mg | Freq: Once | INTRAVENOUS | Status: AC
  Administered 2016-01-28: 0.25 mg via INTRAVENOUS
  Filled 2016-01-28: qty 5

## 2016-01-28 MED ORDER — SODIUM CHLORIDE 0.9 % IV SOLN
10.0000 mg | Freq: Once | INTRAVENOUS | Status: AC
  Administered 2016-01-28: 10 mg via INTRAVENOUS
  Filled 2016-01-28: qty 1

## 2016-01-28 MED ORDER — SODIUM CHLORIDE 0.9 % IV SOLN
Freq: Once | INTRAVENOUS | Status: AC
  Administered 2016-01-28: 12:00:00 via INTRAVENOUS

## 2016-01-28 MED ORDER — SODIUM CHLORIDE 0.9 % IV SOLN
50.0000 mg/m2 | Freq: Once | INTRAVENOUS | Status: AC
  Administered 2016-01-28: 90 mg via INTRAVENOUS
  Filled 2016-01-28: qty 4.5

## 2016-01-28 NOTE — Patient Instructions (Signed)
[facility] Discharge Instructions for Patients Receiving Chemotherapy   Beginning January 23rd 2017 lab work for the [facility] will be done in the  Main lab at [facility] on 1st floor. If you have a lab appointment with the [facility] please come in thru the  Main Entrance and check in at the main information desk   Today you received the following chemotherapy agents:  Carboplatin and etoposide  If you develop nausea and vomiting, or diarrhea that is not controlled by your medication, call the clinic.  The clinic phone number is [phone]. Office hours are Monday-Friday 8:30am-5:00pm.  BELOW ARE SYMPTOMS THAT SHOULD BE REPORTED IMMEDIATELY:  *FEVER GREATER THAN 101.0 F  *CHILLS WITH OR WITHOUT FEVER  NAUSEA AND VOMITING THAT IS NOT CONTROLLED WITH YOUR NAUSEA MEDICATION  *UNUSUAL SHORTNESS OF BREATH  *UNUSUAL BRUISING OR BLEEDING  TENDERNESS IN MOUTH AND THROAT WITH OR WITHOUT PRESENCE OF ULCERS  *URINARY PROBLEMS  *BOWEL PROBLEMS  UNUSUAL RASH Items with * indicate a potential emergency and should be followed up as soon as possible. If you have an emergency after office hours please contact your primary care physician or go to the nearest emergency department.  Please call the clinic during office hours if you have any questions or concerns.   You may also contact the Patient Navigator at [phone] should you have any questions or need assistance in obtaining follow up care.      Resources For Cancer Patients and their Caregivers ? American Cancer Society: Can assist with transportation, wigs, general needs, runs Look Good Feel Better.        ([phone] ? Cancer Care: Provides financial assistance, online support groups, medication/co-pay assistance.  1-800-813-HOPE (915) 828-4017) ? [place] Assists [place] cancer patients and their families through emotional , educational and financial support.   4067606601 ? [place] DSS Where to apply for food stamps, Medicaid and utility assistance. 470-406-6013 ? RCATS: Transportation to medical appointments. 870-621-0605 ? Social Security Administration: May apply for disability if have a Stage IV cancer. 509-150-8116 667-721-6639 ? [employer], Disability and Transit Services: Assists with nutrition, care and transit needs. [phone]

## 2016-01-28 NOTE — Progress Notes (Signed)
1345:  Tolerated tx w/o adverse reaction.  Alert, in no distress. VSS.  Discharged ambulatory.

## 2016-01-29 ENCOUNTER — Encounter (HOSPITAL_COMMUNITY): Payer: Self-pay | Admitting: Oncology

## 2016-01-29 ENCOUNTER — Encounter (HOSPITAL_BASED_OUTPATIENT_CLINIC_OR_DEPARTMENT_OTHER): Payer: Medicare Other

## 2016-01-29 ENCOUNTER — Encounter (HOSPITAL_BASED_OUTPATIENT_CLINIC_OR_DEPARTMENT_OTHER): Payer: Medicare Other | Admitting: Oncology

## 2016-01-29 VITALS — BP 174/78 | HR 91 | Temp 97.3°F | Resp 18 | Wt 158.4 lb

## 2016-01-29 DIAGNOSIS — D6481 Anemia due to antineoplastic chemotherapy: Secondary | ICD-10-CM

## 2016-01-29 DIAGNOSIS — D500 Iron deficiency anemia secondary to blood loss (chronic): Secondary | ICD-10-CM

## 2016-01-29 DIAGNOSIS — T451X5A Adverse effect of antineoplastic and immunosuppressive drugs, initial encounter: Secondary | ICD-10-CM

## 2016-01-29 DIAGNOSIS — C3490 Malignant neoplasm of unspecified part of unspecified bronchus or lung: Secondary | ICD-10-CM | POA: Diagnosis not present

## 2016-01-29 DIAGNOSIS — Z5111 Encounter for antineoplastic chemotherapy: Secondary | ICD-10-CM | POA: Diagnosis not present

## 2016-01-29 DIAGNOSIS — C3491 Malignant neoplasm of unspecified part of right bronchus or lung: Secondary | ICD-10-CM

## 2016-01-29 DIAGNOSIS — Q2733 Arteriovenous malformation of digestive system vessel: Secondary | ICD-10-CM

## 2016-01-29 MED ORDER — SODIUM CHLORIDE 0.9% FLUSH: 10.0000 mL | INTRAVENOUS | Status: DC | PRN

## 2016-01-29 MED ORDER — DIPHENHYDRAMINE HCL 25 MG PO CAPS
25.0000 mg | ORAL_CAPSULE | Freq: Once | ORAL | Status: AC
  Administered 2016-01-29: 25 mg via ORAL

## 2016-01-29 MED ORDER — DEXAMETHASONE SODIUM PHOSPHATE 100 MG/10ML IJ SOLN
10.0000 mg | Freq: Once | INTRAMUSCULAR | Status: AC
  Administered 2016-01-29: 10 mg via INTRAVENOUS
  Filled 2016-01-29: qty 1

## 2016-01-29 MED ORDER — ACETAMINOPHEN 325 MG PO TABS
650.0000 mg | ORAL_TABLET | Freq: Once | ORAL | Status: AC
  Administered 2016-01-29: 650 mg via ORAL

## 2016-01-29 MED ORDER — SODIUM CHLORIDE 0.9 % IV SOLN
250.0000 mL | Freq: Once | INTRAVENOUS | Status: AC
  Administered 2016-01-29: 250 mL via INTRAVENOUS

## 2016-01-29 MED ORDER — SODIUM CHLORIDE 0.9 % IV SOLN
Freq: Once | INTRAVENOUS | Status: AC
  Administered 2016-01-29: 09:00:00 via INTRAVENOUS

## 2016-01-29 MED ORDER — ACETAMINOPHEN 325 MG PO TABS
ORAL_TABLET | ORAL | Status: AC
  Filled 2016-01-29: qty 2

## 2016-01-29 MED ORDER — DIPHENHYDRAMINE HCL 25 MG PO CAPS
ORAL_CAPSULE | ORAL | Status: AC
  Filled 2016-01-29: qty 1

## 2016-01-29 MED ORDER — SODIUM CHLORIDE 0.9 % IV SOLN
50.0000 mg/m2 | Freq: Once | INTRAVENOUS | Status: AC
  Administered 2016-01-29: 90 mg via INTRAVENOUS
  Filled 2016-01-29: qty 4.5

## 2016-01-29 NOTE — Assessment & Plan Note (Addendum)
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  Oncology Flowsheet 07/18/2015 07/25/2015  ferumoxytol (FERAHEME) IV 510 mg 510 mg    Labs performed yesterday: CBC diff, iron/TIBC, ferritin.  I personally reviewed and went over laboratory results with the patient.  The results are noted within this dictation.  Labs in 2 weeks: CBC, ferritin.  Labs in 4-6 weeks: CBC diff, iron/TIBC, ferritin

## 2016-01-29 NOTE — Assessment & Plan Note (Addendum)
Extensive stage small cell lung cancer status post VATS with endobronchial ultrasound and mediastinal endoscopy by Dr. Roxan Hockey on 10/13/2015 resulting in aforementioned diagnosis.  Began palliative Carboplatin/Etoposide on 10/16/2015.  Oncology history updated.  Patients with extended stage disease, the median survival is 8 to 13 months, and the five-year survival rate is 1 to 2 percent.  In most series, less than 5 percent of those with Extensive Stage SCLC survive beyond two years.  Prophylactic cranial irradiation decreases the incidence of symptomatic brain metastases in patients who have responded to systemic chemotherapy, although its impact on overall survival is uncertain. For patients with a response to systemic chemotherapy, thoracic radiation may be of benefit in increasing the percentage of long-term survivors.   DNR   Pre-treatment labs yesterday: CBC diff, CMET, Magnesium.  I personally reviewed and went over laboratory results with the patient.  The results are noted within this dictation.  Labs satisfy treatment criteria.  CT CAP is ordered 4-6 weeks out from cycle #6 for restaging purposes in addition to being his new baseline study for comparative purposes moving forward.  Labs in 4-6 weeks: CBC diff, CMET.  He has refused brain imaging to complete the staging process secondary to claustrophobia and, I suspect, his fear related to the potential of positive exam for intracranial metastases.  He is asymptomatic.  He reports that he believes in [place] and he is healed.  "By his stripes, I am healed."  He declines imaging of brain.    Return in 4-6 weeks for follow-up (after restaging tests).  He would benefit from WBRT.

## 2016-01-29 NOTE — Progress Notes (Signed)
1220:  Tolerated tx and blood transfusion w/o adverse reaction.  Alert, in no distress.  VSS.  Discharged ambulatory.

## 2016-01-29 NOTE — Patient Instructions (Signed)
[place] at [facility] Discharge Instructions  RECOMMENDATIONS MADE BY THE CONSULTANT AND ANY TEST RESULTS WILL BE SENT TO YOUR REFERRING PHYSICIAN.  You were seen by Gershon Mussel today.  Labs in 2 weeks Return to center in 4 weeks for follow up and labs CT scan in 4 weeks  Please call the center with any related concerns.  Thank you for choosing [place] at [facility] to provide your oncology and hematology care.  To afford each patient quality time with our provider, please arrive at least 15 minutes before your scheduled appointment time.   Beginning January 23rd 2017 lab work for the [facility] will be done in the  Main lab at [facility] on 1st floor. If you have a lab appointment with the [facility] please come in thru the  Main Entrance and check in at the main information desk  You need to re-schedule your appointment should you arrive 10 or more minutes late.  We strive to give you quality time with our providers, and arriving late affects you and other patients whose appointments are after yours.  Also, if you no show three or more times for appointments you may be dismissed from the clinic at the providers discretion.     Again, thank you for choosing [facility].  Our hope is that these requests will decrease the amount of time that you wait before being seen by our physicians.       _____________________________________________________________  Should you have questions after your visit to [facility], please contact our office at [phone] between the hours of 8:30 a.m. and 4:30 p.m.  Voicemails left after 4:30 p.m. will not be returned until the following business day.  For prescription refill requests, have your pharmacy contact our office.         Resources For Cancer Patients and their Caregivers ? American Cancer Society: Can assist with transportation, wigs, general needs, runs Look Good  Feel Better.        563-202-2757 ? Cancer Care: Provides financial assistance, online support groups, medication/co-pay assistance.  1-800-813-HOPE 318 738 0565) ? [place] Assists [place] cancer patients and their families through emotional , educational and financial support.  743-794-4574 ? [place] DSS Where to apply for food stamps, Medicaid and utility assistance. 914-361-7596 ? RCATS: Transportation to medical appointments. (332)069-8434 ? Social Security Administration: May apply for disability if have a Stage IV cancer. 715-325-2866 760-504-4515 ? [employer], Disability and Transit Services: Assists with nutrition, care and transit needs. [place] Support Programs: '@10RELATIVEDAYS'$ @ > Cancer Support Group  2nd Tuesday of the month 1pm-2pm, Journey Room  > Creative Journey  3rd Tuesday of the month 1130am-1pm, Journey Room  > Look Good Feel Better  1st Wednesday of the month 10am-12 noon, Journey Room (Call [place] to register [phone])

## 2016-01-29 NOTE — Progress Notes (Signed)
[name], [place] [address]  Small cell lung cancer, unspecified laterality [facility]) - Plan: CBC with Differential, Comprehensive metabolic panel, CT Abdomen Pelvis Wo Contrast, CT Chest Wo Contrast  Iron deficiency anemia due to chronic blood loss - Plan: CBC with Differential, Iron and TIBC, Ferritin, CBC with Differential, Ferritin  CURRENT THERAPY: Carboplatin/Etoposide (dose-reduced based upon renal function) and IV iron replacement in the past and we will continue to do so.  INTERVAL HISTORY: [name] 65 y.o. male returns for followup of Extensive stage small cell lung cancer (based upon bilateral pulmonary involvement radiographically without biopsy of left lung nodules) beginning Carboplatin/Etoposide beginning on 10/16/2015. 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     Small cell lung cancer ([place])   09/26/2015 Initial Diagnosis    Small cell lung cancer ([place])     09/26/2015 Imaging    CT CAP- Extensive malignancy in the thorax. Large right hilar/mediastinal masslike soft tissue density causing encasement and narrowing of the right mainstem bronchus and bronchus intermedius. Discrete mass difficult separate from the extensive multifo..     09/26/2015 - 09/30/2015 Hospital Admission    Epigastric pain     09/29/2015 Procedure    Bronchoscopy by Dr. Luan Pulling     09/29/2015 Pathology Results    Bronchial brushing and washing- BENIGN REACTIVE/REPARATIVE CHANGES.     10/13/2015 Procedure    VIDEO BRONCHOSCOPY WITH ENDOBRONCHIAL ULTRASOUND and MEDIASTINOSCOPY by Dr. Roxan Hockey     10/14/2015 Pathology Results    1. Lymph node, biopsy, 2R - METASTATIC SMALL CELL CARCINOMA. 2. Lymph node, biopsy, cervical - ONE BENIGN LYMPH NODE. 3. Lymph node, biopsy, 2R #2 - METASTATIC SMALL CELL CARCINOMA.     10/16/2015 -  Chemotherapy    Carboplatin/Etoposide (dose reduced based upon renal function).     10/16/2015 Code Status    DNR     10/16/2015 Imaging    [place] L upper extremity- No evidence of deep venous thrombosis.     11/05/2015 Imaging    [place] of L LE- No evidence of deep venous thrombosis.     12/15/2015 Imaging    CT CAP- Significant interval decrease in confluent soft tissue within the right peritracheal and right hilar region. Interval decrease in size of two spiculated left upper lobe pulmonary nodules. Small pericardial effusion.      He denies any complaints today. He is looking forward to completing therapy tomorrow. He has tolerated 6 cycles of chemotherapy extremely well.  Review of Systems  Constitutional: Negative for chills, fever and weight loss.  HENT: Negative.  Negative for tinnitus.   Eyes: Positive for redness (right). Negative for blurred vision and double vision.  Respiratory: Negative.  Negative for hemoptysis.   Cardiovascular: Negative.   Gastrointestinal: Negative.  Negative for constipation, diarrhea, nausea and vomiting.  Genitourinary: Negative.   Musculoskeletal: Negative.   Skin: Negative.   Neurological: Negative.  Negative for dizziness, sensory change, speech change, focal weakness, seizures, loss of consciousness, weakness and headaches.  Endo/Heme/Allergies: Negative.   Psychiatric/Behavioral: Negative.     Past Medical History:  Diagnosis Date  . Anemia    8/3,4,5- blood transfusion- [facility], colonoscopy- [name] & found 2 areas of bleeding   . Antineoplastic chemotherapy induced anemia 01/07/2016  . Anxiety   . Arthritis    back  . AVM (arteriovenous malformation) of colon with hemorrhage   . Blood transfusion   . Carotid artery  occlusion   . CHF (congestive heart failure) ([place])    3 yrs ago  . Chronic kidney disease    renal failure /w osteomyelitis- 2010  . Chronic renal disease, stage 5, glomerular filtration rate less than or equal to 15 mL/min/1.73 square meter (HCC) 10/07/2010  . Difficulty swallowing solids   . Diverticulosis 03/04/10    . DNR (do not resuscitate) 10/16/2015   10/16/2015  . Gastritis   . GERD (gastroesophageal reflux disease)   . HLD (hyperlipidemia)   . HTN (hypertension)   . Internal hemorrhoids   . Iron deficiency anemia due to chronic blood loss 10/07/2010  . Irregular heart beat   . Lung mass 09/26/2015  . Other symptoms involving cardiovascular system   . Peripheral vascular disease, unspecified ([place])   . Pneumonia April 2017  . Right carotid bruit   . Sacral pressure sore 11/04/2015  . Shortness of breath    noted /w low Hgb  . Tobacco use disorder   . Type II or unspecified type diabetes mellitus without mention of complication, not stated as uncontrolled   . Ulcer   . Umbilical hernia now   has not been repaired  . Undiagnosed cardiac murmurs     Past Surgical History:  Procedure Laterality Date  . BELOW KNEE LEG AMPUTATION     2010, wears prosthesis   . bilateral common superficial femoral and profudus artery endarterectomies      2003   Dr. Sherren Mocha Early  . BRONCHIAL BRUSHINGS  09/29/2015   Procedure: BRONCHIAL BRUSHINGS;  Surgeon: Sinda Du, MD;  [facility];  Service: Cardiopulmonary;;  Tracheal and left lung  . BRONCHIAL WASHINGS  09/29/2015   Procedure: BRONCHIAL WASHINGS;  Surgeon: Sinda Du, MD;  [facility];  Service: Cardiopulmonary;;  Bilatersl washings  . CAROTID ENDARTERECTOMY Left 02-09-12  . COLONOSCOPY  03/04/2010   Dr. Princella Pellegrini AMV without mention of ablation, scattered diverticula, internal hemorrhoids  . COLONOSCOPY  11/2004   Dr. Barkley Bruns  . COLONOSCOPY  01/22/2012   NUR: Two cecal AV malformations without stigmata of bleed with maximal.meter of 8-10 mm. Both of these are ablated with argon plasma coagulator./ Small external hemorrhoids  . ENDARTERECTOMY  02/09/2012   Procedure: ENDARTERECTOMY CAROTID;  Surgeon: Rosetta Posner, MD;  Location: [facility];  Service: Vascular;  Laterality: Left;  . ENTEROSCOPY  01/21/2012   Procedure:  ENTEROSCOPY;  Surgeon: Danie Binder, MD;  [facility];  Service: Endoscopy;;  . ESOPHAGOGASTRODUODENOSCOPY  03/2000   Dr. Tharon Aquas gastritis, H.Pylori gastritis, ?treated  . ESOPHAGOGASTRODUODENOSCOPY  05/2006   Dr. Marcello Fennel recurrent GI bleed. antral gastritis, single gastric AVM s/p APC, CLO results?  . ESOPHAGOGASTRODUODENOSCOPY  01/21/2012   SLF: MILD TO MAODERATE Gastritis/ SLOW GIB LIKEY DUE TO PT BEING ON ASA ND SUBSEQUENT BLOOD/ LOSS FROM AVMS IN STOMACH AND COLON AS WELL AS GASTRITIS  . ESOPHAGOGASTRODUODENOSCOPY N/A 09/03/2013   Mild chronic gastritis. benign gastric polyps  . ESOPHAGOGASTRODUODENOSCOPY N/A 08/19/2015   Dr. Oneida Alar: 2 large gastric AVMs in fundus, 1 actively bleeding s/p APC and clip placement, empiric Savary dilation  . FLEXIBLE BRONCHOSCOPY N/A 09/29/2015   Procedure: FLEXIBLE BRONCHOSCOPY;  Surgeon: Sinda Du, MD;  [facility];  Service: Cardiopulmonary;  Laterality: N/A;  . GIVENS CAPSULE STUDY N/A 09/03/2013   Dr. Oneida Alar: small bowel and colonic AVMs. no active bleeding  . MEDIASTINOSCOPY N/A 10/13/2015   Procedure: MEDIASTINOSCOPY;  Surgeon: Melrose Nakayama, MD;  Location: [facility];  Service: Thoracic;  Laterality: N/A;  . 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 SAVORY DILATION N/A 08/19/2015   Procedure: SAVORY DILATION;  Surgeon: Danie Binder, MD;  [facility];  Service: Endoscopy;  Laterality: N/A;  . VASCULAR SURGERY     rt bka and rt 1st toe amputation  . VIDEO BRONCHOSCOPY WITH ENDOBRONCHIAL ULTRASOUND N/A 10/13/2015   Procedure: VIDEO BRONCHOSCOPY WITH ENDOBRONCHIAL ULTRASOUND;  Surgeon: Melrose Nakayama, MD;  Location: [facility];  Service: Thoracic;  Laterality: N/A;    Family History  Problem Relation Age of Onset  . Colon cancer Brother 37    deceased  . Hyperlipidemia Brother   . Hypertension Brother   . Lung cancer Sister     deceased, three primary  cancers: lung, esophageal, lymphoma.   . Diabetes Sister   . Hypertension Sister   . Cancer Father     gallbladder  . Hyperlipidemia Father   . Hypertension Father   . Cancer Brother     unknown primary  . Cancer Sister     unknown primary  . Hyperlipidemia Sister   . Diabetes Mother   . Hypertension Mother     Social History   Social History  . Marital status: Married    Spouse name: N/A  . Number of children: N/A  . Years of education: N/A   Social History Main Topics  . Smoking status: Former Smoker    Packs/day: 2.00    Years: 50.00    Types: Cigarettes  . Smokeless tobacco: Former Systems developer    Quit date: 02/08/2012     Comment: Smokes about 2 packs of tiny skinny cigarettes  . Alcohol use No  . Drug use: No  . Sexual activity: Not Asked   Other Topics Concern  . None   Social History Narrative  . None     PHYSICAL EXAMINATION  ECOG PERFORMANCE STATUS: 2 - Symptomatic, <50% confined to bed  There were no vitals filed for this visit.  Blood pressure 151/47 Pulse 88 Respirations 18 Temperature 97.6 Oxygen saturation 97% on room air  GENERAL:alert, no distress, comfortable, cooperative, smiling and chronically ill appearing, in a chemo-recliner, accompanied by his wife. SKIN: skin color, texture, turgor are normal, no rashes or significant lesions. HEAD: Normocephalic, No masses, lesions, tenderness or abnormalities EYES: right subconjunctival blood on lateral aspect EARS: External ears normal OROPHARYNX:lips, buccal mucosa, and tongue normal and mucous membranes are moist  NECK: supple, trachea midline LYMPH:  no palpable lymphadenopathy BREAST:not examined LUNGS: clear to auscultation decreased breath sounds bilaterally HEART: regular rate & rhythm ABDOMEN:abdomen soft and normal bowel sounds BACK: Back symmetric, no curvature. EXTREMITIES:less then 2 second capillary refill, no skin discoloration, no cyanosis, right above the knee amputation NEURO:  alert & oriented x 3 with fluent speech, no focal motor/sensory deficits   LABORATORY DATA: CBC    Component Value Date/Time   WBC 10.3 01/28/2016 0951   RBC 2.72 (L) 01/28/2016 0951   HGB 8.6 (L) 01/28/2016 0951   HCT 27.2 (L) 01/28/2016 0951   PLT 178 01/28/2016 0951   MCV 100.0 01/28/2016 0951   MCH 31.6 01/28/2016 0951   MCHC 31.6 01/28/2016 0951   RDW 19.3 (H) 01/28/2016 0951   LYMPHSABS 0.8 01/28/2016 0951   MONOABS 0.6 01/28/2016 0951   EOSABS 0.1 01/28/2016 0951   BASOSABS 0.0 01/28/2016 0951      Chemistry      Component Value Date/Time  NA 137 01/28/2016 0951   NA 140 05/06/2015 1512   K 4.7 01/28/2016 0951   CL 103 01/28/2016 0951   CO2 28 01/28/2016 0951   BUN 51 (H) 01/28/2016 0951   BUN 34 (H) 05/06/2015 1512   CREATININE 3.57 (H) 01/28/2016 0951   CREATININE 1.59 (H) 12/06/2012 0852      Component Value Date/Time   CALCIUM 8.1 (L) 01/28/2016 0951   CALCIUM 8.4 (L) 09/03/2015 1230   ALKPHOS 129 (H) 01/28/2016 0951   AST 15 01/28/2016 0951   ALT 10 (L) 01/28/2016 0951   BILITOT 0.4 01/28/2016 0951   BILITOT 0.2 05/06/2015 1512      Lab Results  Component Value Date   IRON 40 (L) 01/28/2016   TIBC 196 (L) 01/28/2016   FERRITIN 550 (H) 01/28/2016     PENDING LABS:   RADIOGRAPHIC STUDIES:  No results found.   PATHOLOGY:    ASSESSMENT AND PLAN:  Small cell lung cancer ([place]) Extensive stage small cell lung cancer status post VATS with endobronchial ultrasound and mediastinal endoscopy by Dr. Roxan Hockey on 10/13/2015 resulting in aforementioned diagnosis.  Began palliative Carboplatin/Etoposide on 10/16/2015.  Oncology history updated.  Patients with extended stage disease, the median survival is 8 to 13 months, and the five-year survival rate is 1 to 2 percent.  In most series, less than 5 percent of those with Extensive Stage SCLC survive beyond two years.  Prophylactic cranial irradiation decreases the incidence of symptomatic brain  metastases in patients who have responded to systemic chemotherapy, although its impact on overall survival is uncertain. For patients with a response to systemic chemotherapy, thoracic radiation may be of benefit in increasing the percentage of long-term survivors.   DNR   Pre-treatment labs yesterday: CBC diff, CMET, Magnesium.  I personally reviewed and went over laboratory results with the patient.  The results are noted within this dictation.  Labs satisfy treatment criteria.  CT CAP is ordered 4-6 weeks out from cycle #6 for restaging purposes in addition to being his new baseline study for comparative purposes moving forward.  Labs in 4-6 weeks: CBC diff, CMET.  He has refused brain imaging to complete the staging process secondary to claustrophobia and, I suspect, his fear related to the potential of positive exam for intracranial metastases.  He is asymptomatic.  He reports that he believes in [place] and he is healed.  "By his stripes, I am healed."  He declines imaging of brain.    Return in 4-6 weeks for follow-up (after restaging tests).  He would benefit from WBRT.   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  Oncology Flowsheet 07/18/2015 07/25/2015  ferumoxytol (FERAHEME) IV 510 mg 510 mg    Labs performed yesterday: CBC diff, iron/TIBC, ferritin.  I personally reviewed and went over laboratory results with the patient.  The results are noted within this dictation.  Labs in 2 weeks: CBC, ferritin.  Labs in 4-6 weeks: CBC diff, iron/TIBC, ferritin   ORDERS PLACED FOR THIS ENCOUNTER: Orders Placed This Encounter  Procedures  . CT Abdomen Pelvis Wo Contrast  . CT Chest Wo Contrast  . CBC with Differential  . Comprehensive metabolic panel  .  Iron and TIBC  . Ferritin  . CBC with Differential  . Ferritin  MEDICATIONS PRESCRIBED THIS ENCOUNTER: Meds ordered this encounter  Medications  . omeprazole (PRILOSEC) 40 MG capsule    THERAPY PLAN:  Continue with palliative treatment as planned.  Will restaging him with CT imaging following cycle #6.  All questions were answered. The patient knows to call the clinic with any problems, questions or concerns. We can certainly see the patient much sooner if necessary.  Patient and plan discussed with Dr. Ancil Linsey and she is in agreement with the aforementioned.   This note is electronically signed by: Doy Mince 01/29/2016 7:34 PM

## 2016-01-29 NOTE — Patient Instructions (Signed)
[facility] Discharge Instructions for Patients Receiving Chemotherapy   Beginning January 23rd 2017 lab work for the [facility] will be done in the  Main lab at [facility] on 1st floor. If you have a lab appointment with the [facility] please come in thru the  Main Entrance and check in at the main information desk   Today you received the following chemotherapy agents:  Etoposide You also received one unit of blood. Return as scheduled for treatment. If you develop nausea and vomiting, or diarrhea that is not controlled by your medication, call the clinic.  The clinic phone number is [phone]. Office hours are Monday-Friday 8:30am-5:00pm.  BELOW ARE SYMPTOMS THAT SHOULD BE REPORTED IMMEDIATELY:  *FEVER GREATER THAN 101.0 F  *CHILLS WITH OR WITHOUT FEVER  NAUSEA AND VOMITING THAT IS NOT CONTROLLED WITH YOUR NAUSEA MEDICATION  *UNUSUAL SHORTNESS OF BREATH  *UNUSUAL BRUISING OR BLEEDING  TENDERNESS IN MOUTH AND THROAT WITH OR WITHOUT PRESENCE OF ULCERS  *URINARY PROBLEMS  *BOWEL PROBLEMS  UNUSUAL RASH Items with * indicate a potential emergency and should be followed up as soon as possible. If you have an emergency after office hours please contact your primary care physician or go to the nearest emergency department.  Please call the clinic during office hours if you have any questions or concerns.   You may also contact the Patient Navigator at [phone] should you have any questions or need assistance in obtaining follow up care.      Resources For Cancer Patients and their Caregivers ? American Cancer Society: Can assist with transportation, wigs, general needs, runs Look Good Feel Better.        (360) 671-5534 ? Cancer Care: Provides financial assistance, online support groups, medication/co-pay assistance.  1-800-813-HOPE 867-203-6172) ? [place] Assists [place] cancer patients and their families  through emotional , educational and financial support.  706-808-2466 ? [place] DSS Where to apply for food stamps, Medicaid and utility assistance. (331)413-0180 ? [employer]: Transportation to medical appointments. 708-300-4801 ? Social Security Administration: May apply for disability if have a Stage IV cancer. 647-359-7935 337-051-3432 ? [employer], Disability and Transit Services: Assists with nutrition, care and transit needs. [phone]

## 2016-01-30 ENCOUNTER — Encounter (HOSPITAL_BASED_OUTPATIENT_CLINIC_OR_DEPARTMENT_OTHER): Payer: Medicare Other

## 2016-01-30 VITALS — BP 140/58 | HR 84 | Temp 97.8°F | Resp 18 | Wt 153.2 lb

## 2016-01-30 DIAGNOSIS — C3491 Malignant neoplasm of unspecified part of right bronchus or lung: Secondary | ICD-10-CM

## 2016-01-30 DIAGNOSIS — Z5111 Encounter for antineoplastic chemotherapy: Secondary | ICD-10-CM

## 2016-01-30 DIAGNOSIS — Z5189 Encounter for other specified aftercare: Secondary | ICD-10-CM | POA: Diagnosis not present

## 2016-01-30 LAB — TYPE AND SCREEN
ABO/RH(D): O POS
ANTIBODY SCREEN: NEGATIVE
Unit division: 0

## 2016-01-30 MED ORDER — SODIUM CHLORIDE 0.9% FLUSH
10.0000 mL | INTRAVENOUS | Status: DC | PRN
  Administered 2016-01-30: 10 mL
  Filled 2016-01-30: qty 10

## 2016-01-30 MED ORDER — SODIUM CHLORIDE 0.9 % IV SOLN
50.0000 mg/m2 | Freq: Once | INTRAVENOUS | Status: AC
  Administered 2016-01-30: 90 mg via INTRAVENOUS
  Filled 2016-01-30: qty 4.5

## 2016-01-30 MED ORDER — PEGFILGRASTIM 6 MG/0.6ML ~~LOC~~ PSKT
6.0000 mg | PREFILLED_SYRINGE | Freq: Once | SUBCUTANEOUS | Status: AC
  Administered 2016-01-30: 6 mg via SUBCUTANEOUS
  Filled 2016-01-30: qty 0.6

## 2016-01-30 MED ORDER — SODIUM CHLORIDE 0.9 % IV SOLN
Freq: Once | INTRAVENOUS | Status: AC
  Administered 2016-01-30: 14:00:00 via INTRAVENOUS

## 2016-01-30 MED ORDER — SODIUM CHLORIDE 0.9 % IV SOLN
10.0000 mg | Freq: Once | INTRAVENOUS | Status: AC
  Administered 2016-01-30: 10 mg via INTRAVENOUS
  Filled 2016-01-30: qty 1

## 2016-01-30 NOTE — Patient Instructions (Signed)
[facility] Discharge Instructions for Patients Receiving Chemotherapy   Beginning January 23rd 2017 lab work for the Cancer Center will be done in the  Main lab at [facility] on 1st floor. If you have a lab appointment with the Cancer Center please come in thru the  Main Entrance and check in at the main information desk   Today you received the following chemotherapy agents VP-16. Follow-up as scheduled. Call clinic for any questions or concerns  To help prevent nausea and vomiting after your treatment, we encourage you to take your nausea medication   If you develop nausea and vomiting, or diarrhea that is not controlled by your medication, call the clinic.  The clinic phone number is [phone]. Office hours are Monday-Friday 8:30am-5:00pm.  BELOW ARE SYMPTOMS THAT SHOULD BE REPORTED IMMEDIATELY:  *FEVER GREATER THAN 101.0 F  *CHILLS WITH OR WITHOUT FEVER  NAUSEA AND VOMITING THAT IS NOT CONTROLLED WITH YOUR NAUSEA MEDICATION  *UNUSUAL SHORTNESS OF BREATH  *UNUSUAL BRUISING OR BLEEDING  TENDERNESS IN MOUTH AND THROAT WITH OR WITHOUT PRESENCE OF ULCERS  *URINARY PROBLEMS  *BOWEL PROBLEMS  UNUSUAL RASH Items with * indicate a potential emergency and should be followed up as soon as possible. If you have an emergency after office hours please contact your primary care physician or go to the nearest emergency department.  Please call the clinic during office hours if you have any questions or concerns.   You may also contact the Patient Navigator at [phone] should you have any questions or need assistance in obtaining follow up care.      Resources For Cancer Patients and their Caregivers ? American Cancer Society: Can assist with transportation, wigs, general needs, runs Look Good Feel Better.        1-888-227-6333 ? Cancer Care: Provides financial assistance, online support groups, medication/co-pay assistance.  1-800-813-HOPE  (4673) ? [facility] Assists [place] cancer patients and their families through emotional , educational and financial support.  336-427-4357 ? [place] DSS Where to apply for food stamps, Medicaid and utility assistance. 336-342-1394 ? [employer]: Transportation to medical appointments. 336-347-2287 ? Social Security Administration: May apply for disability if have a Stage IV cancer. 336-342-7796 1-800-772-1213 ? [place] [employer], Disability and Transit Services: Assists with nutrition, care and transit needs. 336-349-2343         

## 2016-01-30 NOTE — Progress Notes (Signed)
[name] tolerated chemo tx well without complaints. Neulasta on-pro applied to pt's right arm and instructed pt on when to remove it. Pt discharged self ambulatory using walker in satisfactory condition with wife

## 2016-02-01 ENCOUNTER — Encounter (HOSPITAL_COMMUNITY): Payer: Self-pay | Admitting: Hematology & Oncology

## 2016-02-02 ENCOUNTER — Other Ambulatory Visit: Payer: Self-pay | Admitting: Nurse Practitioner

## 2016-02-04 ENCOUNTER — Telehealth: Payer: Self-pay | Admitting: Nurse Practitioner

## 2016-02-04 NOTE — Telephone Encounter (Signed)
Pt aware.

## 2016-02-04 NOTE — Telephone Encounter (Signed)
Will need to come by and pick up rx

## 2016-02-04 NOTE — Telephone Encounter (Signed)
Please review and advise.

## 2016-02-05 DIAGNOSIS — R918 Other nonspecific abnormal finding of lung field: Secondary | ICD-10-CM | POA: Diagnosis not present

## 2016-02-05 DIAGNOSIS — I5020 Unspecified systolic (congestive) heart failure: Secondary | ICD-10-CM | POA: Diagnosis not present

## 2016-02-06 ENCOUNTER — Other Ambulatory Visit (HOSPITAL_COMMUNITY)
Admission: RE | Admit: 2016-02-06 | Discharge: 2016-02-06 | Disposition: A | Discharge status: Home / Self Care | Payer: Medicare Other | Source: Other Acute Inpatient Hospital | Attending: Nephrology | Admitting: Nephrology

## 2016-02-06 ENCOUNTER — Encounter (HOSPITAL_COMMUNITY): Payer: Medicare Other

## 2016-02-06 DIAGNOSIS — R350 Frequency of micturition: Secondary | ICD-10-CM | POA: Insufficient documentation

## 2016-02-06 DIAGNOSIS — D500 Iron deficiency anemia secondary to blood loss (chronic): Secondary | ICD-10-CM | POA: Diagnosis not present

## 2016-02-06 LAB — URINALYSIS, ROUTINE W REFLEX MICROSCOPIC
BILIRUBIN URINE: NEGATIVE
Glucose, UA: 100 mg/dL — AB
KETONES UR: NEGATIVE mg/dL
Leukocytes, UA: NEGATIVE
Nitrite: NEGATIVE
PH: 5.5 (ref 5.0–8.0)
Protein, ur: 100 mg/dL — AB
SPECIFIC GRAVITY, URINE: 1.015 (ref 1.005–1.030)

## 2016-02-06 LAB — CBC WITH DIFFERENTIAL/PLATELET
Basophils Absolute: 0 10*3/uL (ref 0.0–0.1)
Basophils Relative: 0 %
EOS ABS: 0 10*3/uL (ref 0.0–0.7)
EOS PCT: 0 %
HEMATOCRIT: 22.6 % — AB (ref 39.0–52.0)
Hemoglobin: 7.3 g/dL — ABNORMAL LOW (ref 13.0–17.0)
LYMPHS ABS: 0.9 10*3/uL (ref 0.7–4.0)
LYMPHS PCT: 8 %
MCH: 31.5 pg (ref 26.0–34.0)
MCHC: 32.3 g/dL (ref 30.0–36.0)
MCV: 97.4 fL (ref 78.0–100.0)
MONO ABS: 0.8 10*3/uL (ref 0.1–1.0)
MONOS PCT: 7 %
Neutro Abs: 9.4 10*3/uL — ABNORMAL HIGH (ref 1.7–7.7)
Neutrophils Relative %: 84 %
PLATELETS: 86 10*3/uL — AB (ref 150–400)
RBC: 2.32 MIL/uL — AB (ref 4.22–5.81)
RDW: 18 % — AB (ref 11.5–15.5)
WBC: 11.2 10*3/uL — AB (ref 4.0–10.5)

## 2016-02-06 LAB — URINE MICROSCOPIC-ADD ON
Bacteria, UA: NONE SEEN
Squamous Epithelial / LPF: NONE SEEN
WBC, UA: NONE SEEN WBC/hpf (ref 0–5)

## 2016-02-06 LAB — FERRITIN: Ferritin: 811 ng/mL — ABNORMAL HIGH (ref 24–336)

## 2016-02-09 ENCOUNTER — Encounter (HOSPITAL_COMMUNITY): Payer: Medicare Other

## 2016-02-09 DIAGNOSIS — D500 Iron deficiency anemia secondary to blood loss (chronic): Secondary | ICD-10-CM

## 2016-02-09 DIAGNOSIS — D649 Anemia, unspecified: Principal | ICD-10-CM

## 2016-02-09 DIAGNOSIS — C3490 Malignant neoplasm of unspecified part of unspecified bronchus or lung: Secondary | ICD-10-CM

## 2016-02-09 DIAGNOSIS — D6489 Other specified anemias: Secondary | ICD-10-CM

## 2016-02-09 LAB — URINE CULTURE: Culture: <10000 — AB

## 2016-02-09 LAB — PREPARE RBC (CROSSMATCH)

## 2016-02-10 ENCOUNTER — Encounter (HOSPITAL_COMMUNITY)
Admission: RE | Admit: 2016-02-10 | Discharge: 2016-02-10 | Disposition: A | Discharge status: Home / Self Care | Payer: Medicare Other | Source: Ambulatory Visit | Attending: Oncology | Admitting: Oncology

## 2016-02-10 DIAGNOSIS — C3490 Malignant neoplasm of unspecified part of unspecified bronchus or lung: Secondary | ICD-10-CM

## 2016-02-10 DIAGNOSIS — D649 Anemia, unspecified: Secondary | ICD-10-CM | POA: Insufficient documentation

## 2016-02-10 DIAGNOSIS — D500 Iron deficiency anemia secondary to blood loss (chronic): Secondary | ICD-10-CM | POA: Diagnosis not present

## 2016-02-10 DIAGNOSIS — D6489 Other specified anemias: Secondary | ICD-10-CM

## 2016-02-10 MED ORDER — HEPARIN SOD (PORK) LOCK FLUSH 100 UNIT/ML IV SOLN
500.0000 [IU] | Freq: Every day | INTRAVENOUS | Status: DC | PRN
  Filled 2016-02-10: qty 5

## 2016-02-10 MED ORDER — DIPHENHYDRAMINE HCL 25 MG PO CAPS
25.0000 mg | ORAL_CAPSULE | Freq: Once | ORAL | Status: AC
  Administered 2016-02-10: 25 mg via ORAL
  Filled 2016-02-10: qty 1

## 2016-02-10 MED ORDER — SODIUM CHLORIDE 0.9 % IV SOLN: 250.0000 mL | Freq: Once | INTRAVENOUS | Status: DC

## 2016-02-10 MED ORDER — ACETAMINOPHEN 325 MG PO TABS
650.0000 mg | ORAL_TABLET | Freq: Once | ORAL | Status: AC
  Administered 2016-02-10: 650 mg via ORAL
  Filled 2016-02-10: qty 2

## 2016-02-10 MED ORDER — SODIUM CHLORIDE 0.9% FLUSH: 10.0000 mL | INTRAVENOUS | Status: DC | PRN

## 2016-02-11 LAB — TYPE AND SCREEN
ABO/RH(D): O POS
Antibody Screen: NEGATIVE
Unit division: 0
Unit division: 0

## 2016-02-12 ENCOUNTER — Other Ambulatory Visit (HOSPITAL_COMMUNITY): Payer: Medicare Other

## 2016-02-13 ENCOUNTER — Ambulatory Visit (HOSPITAL_COMMUNITY): Payer: Medicare Other

## 2016-02-20 ENCOUNTER — Other Ambulatory Visit: Payer: Self-pay | Admitting: Nurse Practitioner

## 2016-02-20 DIAGNOSIS — Z89511 Acquired absence of right leg below knee: Secondary | ICD-10-CM | POA: Diagnosis not present

## 2016-02-20 DIAGNOSIS — I87332 Chronic venous hypertension (idiopathic) with ulcer and inflammation of left lower extremity: Secondary | ICD-10-CM | POA: Diagnosis not present

## 2016-02-25 ENCOUNTER — Telehealth: Payer: Self-pay | Admitting: Nurse Practitioner

## 2016-02-25 NOTE — Telephone Encounter (Signed)
Scheduled

## 2016-02-26 DIAGNOSIS — Z89511 Acquired absence of right leg below knee: Secondary | ICD-10-CM | POA: Diagnosis not present

## 2016-02-27 ENCOUNTER — Other Ambulatory Visit (HOSPITAL_COMMUNITY): Payer: Self-pay | Admitting: Hematology & Oncology

## 2016-02-27 ENCOUNTER — Ambulatory Visit (HOSPITAL_COMMUNITY)
Admission: RE | Admit: 2016-02-27 | Discharge: 2016-02-27 | Disposition: A | Discharge status: Home / Self Care | Payer: Medicare Other | Source: Ambulatory Visit | Attending: Oncology | Admitting: Oncology

## 2016-02-27 DIAGNOSIS — C3490 Malignant neoplasm of unspecified part of unspecified bronchus or lung: Secondary | ICD-10-CM | POA: Diagnosis not present

## 2016-03-01 ENCOUNTER — Ambulatory Visit (HOSPITAL_COMMUNITY): Payer: Medicare Other

## 2016-03-01 ENCOUNTER — Other Ambulatory Visit (HOSPITAL_COMMUNITY): Payer: Self-pay | Admitting: Emergency Medicine

## 2016-03-02 ENCOUNTER — Encounter (HOSPITAL_COMMUNITY): Payer: Medicare Other

## 2016-03-02 ENCOUNTER — Encounter (HOSPITAL_COMMUNITY): Payer: Medicare Other | Attending: Hematology & Oncology | Admitting: Hematology & Oncology

## 2016-03-02 ENCOUNTER — Encounter (HOSPITAL_COMMUNITY): Payer: Self-pay | Admitting: Hematology & Oncology

## 2016-03-02 VITALS — BP 153/58 | HR 89 | Temp 97.7°F | Resp 18 | Wt 152.2 lb

## 2016-03-02 DIAGNOSIS — Q2733 Arteriovenous malformation of digestive system vessel: Secondary | ICD-10-CM | POA: Diagnosis not present

## 2016-03-02 DIAGNOSIS — M479 Spondylosis, unspecified: Secondary | ICD-10-CM | POA: Insufficient documentation

## 2016-03-02 DIAGNOSIS — E1142 Type 2 diabetes mellitus with diabetic polyneuropathy: Secondary | ICD-10-CM | POA: Insufficient documentation

## 2016-03-02 DIAGNOSIS — I509 Heart failure, unspecified: Secondary | ICD-10-CM | POA: Diagnosis not present

## 2016-03-02 DIAGNOSIS — K429 Umbilical hernia without obstruction or gangrene: Secondary | ICD-10-CM | POA: Insufficient documentation

## 2016-03-02 DIAGNOSIS — D500 Iron deficiency anemia secondary to blood loss (chronic): Secondary | ICD-10-CM | POA: Insufficient documentation

## 2016-03-02 DIAGNOSIS — C3401 Malignant neoplasm of right main bronchus: Secondary | ICD-10-CM | POA: Diagnosis not present

## 2016-03-02 DIAGNOSIS — K2970 Gastritis, unspecified, without bleeding: Secondary | ICD-10-CM | POA: Insufficient documentation

## 2016-03-02 DIAGNOSIS — K219 Gastro-esophageal reflux disease without esophagitis: Secondary | ICD-10-CM | POA: Diagnosis not present

## 2016-03-02 DIAGNOSIS — Q2730 Arteriovenous malformation, site unspecified: Secondary | ICD-10-CM | POA: Insufficient documentation

## 2016-03-02 DIAGNOSIS — E785 Hyperlipidemia, unspecified: Secondary | ICD-10-CM | POA: Insufficient documentation

## 2016-03-02 DIAGNOSIS — D6481 Anemia due to antineoplastic chemotherapy: Secondary | ICD-10-CM | POA: Diagnosis not present

## 2016-03-02 DIAGNOSIS — Z720 Tobacco use: Secondary | ICD-10-CM | POA: Diagnosis not present

## 2016-03-02 DIAGNOSIS — Z89519 Acquired absence of unspecified leg below knee: Secondary | ICD-10-CM | POA: Diagnosis not present

## 2016-03-02 DIAGNOSIS — R0989 Other specified symptoms and signs involving the circulatory and respiratory systems: Secondary | ICD-10-CM | POA: Diagnosis not present

## 2016-03-02 DIAGNOSIS — F419 Anxiety disorder, unspecified: Secondary | ICD-10-CM | POA: Diagnosis not present

## 2016-03-02 DIAGNOSIS — N185 Chronic kidney disease, stage 5: Secondary | ICD-10-CM

## 2016-03-02 DIAGNOSIS — N184 Chronic kidney disease, stage 4 (severe): Secondary | ICD-10-CM | POA: Diagnosis not present

## 2016-03-02 DIAGNOSIS — C3490 Malignant neoplasm of unspecified part of unspecified bronchus or lung: Secondary | ICD-10-CM | POA: Diagnosis not present

## 2016-03-02 DIAGNOSIS — I6529 Occlusion and stenosis of unspecified carotid artery: Secondary | ICD-10-CM | POA: Diagnosis not present

## 2016-03-02 DIAGNOSIS — E1122 Type 2 diabetes mellitus with diabetic chronic kidney disease: Secondary | ICD-10-CM | POA: Diagnosis not present

## 2016-03-02 DIAGNOSIS — I130 Hypertensive heart and chronic kidney disease with heart failure and stage 1 through stage 4 chronic kidney disease, or unspecified chronic kidney disease: Secondary | ICD-10-CM | POA: Insufficient documentation

## 2016-03-02 DIAGNOSIS — Z66 Do not resuscitate: Secondary | ICD-10-CM

## 2016-03-02 DIAGNOSIS — L8991 Pressure ulcer of unspecified site, stage 1: Secondary | ICD-10-CM | POA: Diagnosis not present

## 2016-03-02 DIAGNOSIS — K5521 Angiodysplasia of colon with hemorrhage: Secondary | ICD-10-CM

## 2016-03-02 LAB — CBC WITH DIFFERENTIAL/PLATELET
Basophils Absolute: 0 10*3/uL (ref 0.0–0.1)
Basophils Relative: 0 %
Eosinophils Absolute: 0.1 10*3/uL (ref 0.0–0.7)
Eosinophils Relative: 2 %
HEMATOCRIT: 27.2 % — AB (ref 39.0–52.0)
HEMOGLOBIN: 8.8 g/dL — AB (ref 13.0–17.0)
LYMPHS ABS: 0.6 10*3/uL — AB (ref 0.7–4.0)
Lymphocytes Relative: 12 %
MCH: 31.2 pg (ref 26.0–34.0)
MCHC: 32.4 g/dL (ref 30.0–36.0)
MCV: 96.5 fL (ref 78.0–100.0)
MONO ABS: 0.4 10*3/uL (ref 0.1–1.0)
MONOS PCT: 8 %
NEUTROS ABS: 4 10*3/uL (ref 1.7–7.7)
NEUTROS PCT: 78 %
Platelets: 201 10*3/uL (ref 150–400)
RBC: 2.82 MIL/uL — ABNORMAL LOW (ref 4.22–5.81)
RDW: 19.5 % — AB (ref 11.5–15.5)
WBC: 5.1 10*3/uL (ref 4.0–10.5)

## 2016-03-02 LAB — IRON AND TIBC
IRON: 38 ug/dL — AB (ref 45–182)
SATURATION RATIOS: 18 % (ref 17.9–39.5)
TIBC: 210 ug/dL — ABNORMAL LOW (ref 250–450)
UIBC: 172 ug/dL

## 2016-03-02 LAB — COMPREHENSIVE METABOLIC PANEL
ALK PHOS: 109 U/L (ref 38–126)
ALT: 8 U/L — ABNORMAL LOW (ref 17–63)
ANION GAP: 7 (ref 5–15)
AST: 14 U/L — ABNORMAL LOW (ref 15–41)
Albumin: 3.3 g/dL — ABNORMAL LOW (ref 3.5–5.0)
BILIRUBIN TOTAL: 0.5 mg/dL (ref 0.3–1.2)
BUN: 46 mg/dL — ABNORMAL HIGH (ref 6–20)
CALCIUM: 8.6 mg/dL — AB (ref 8.9–10.3)
CO2: 25 mmol/L (ref 22–32)
Chloride: 105 mmol/L (ref 101–111)
Creatinine, Ser: 3.54 mg/dL — ABNORMAL HIGH (ref 0.61–1.24)
GFR, EST AFRICAN AMERICAN: 19 mL/min — AB (ref >60)
GFR, EST NON AFRICAN AMERICAN: 17 mL/min — AB (ref >60)
GLUCOSE: 124 mg/dL — AB (ref 65–99)
Potassium: 4.8 mmol/L (ref 3.5–5.1)
Sodium: 137 mmol/L (ref 135–145)
TOTAL PROTEIN: 6.1 g/dL — AB (ref 6.5–8.1)

## 2016-03-02 LAB — FERRITIN: Ferritin: 363 ng/mL — ABNORMAL HIGH (ref 24–336)

## 2016-03-02 MED ORDER — HYDROCODONE-ACETAMINOPHEN 5-325 MG PO TABS: 1.0000 | ORAL_TABLET | ORAL | 0 refills | Status: AC | PRN

## 2016-03-02 NOTE — Patient Instructions (Addendum)
[place] at [facility] Discharge Instructions  RECOMMENDATIONS MADE BY THE CONSULTANT AND ANY TEST RESULTS WILL BE SENT TO YOUR REFERRING PHYSICIAN.  You saw Dr. Whitney Muse today. Blood transfusion next week. Follow up with Tom in 2 weeks.  Thank you for choosing [place] at [facility] to provide your oncology and hematology care.  To afford each patient quality time with our provider, please arrive at least 15 minutes before your scheduled appointment time.   Beginning January 23rd 2017 lab work for the [facility] will be done in the  Main lab at [facility] on 1st floor. If you have a lab appointment with the [facility] please come in thru the  Main Entrance and check in at the main information desk  You need to re-schedule your appointment should you arrive 10 or more minutes late.  We strive to give you quality time with our providers, and arriving late affects you and other patients whose appointments are after yours.  Also, if you no show three or more times for appointments you may be dismissed from the clinic at the providers discretion.     Again, thank you for choosing [facility].  Our hope is that these requests will decrease the amount of time that you wait before being seen by our physicians.       _____________________________________________________________  Should you have questions after your visit to [facility], please contact our office at [phone] between the hours of 8:30 a.m. and 4:30 p.m.  Voicemails left after 4:30 p.m. will not be returned until the following business day.  For prescription refill requests, have your pharmacy contact our office.         Resources For Cancer Patients and their Caregivers ? American Cancer Society: Can assist with transportation, wigs, general needs, runs Look Good Feel Better.        930-070-9271 ? Cancer Care: Provides financial  assistance, online support groups, medication/co-pay assistance.  1-800-813-HOPE 516 439 6725) ? [place] Assists [place] cancer patients and their families through emotional , educational and financial support.  619-472-9318 ? [place] DSS Where to apply for food stamps, Medicaid and utility assistance. 810-581-4768 ? [employer]: Transportation to medical appointments. 719-798-3666 ? Social Security Administration: May apply for disability if have a Stage IV cancer. (804)222-1032 2363403503 ? [employer], Disability and Transit Services: Assists with nutrition, care and transit needs. [facility] Support Programs: '@10RELATIVEDAYS'$ @ > Cancer Support Group  2nd Tuesday of the month 1pm-2pm, Journey Room  > Creative Journey  3rd Tuesday of the month 1130am-1pm, Journey Room  > Look Good Feel Better  1st Wednesday of the month 10am-12 noon, Journey Room (Call [place] to register [phone])

## 2016-03-02 NOTE — Progress Notes (Signed)
[name], [place] [address]  Small cell lung cancer, unspecified laterality [facility] : [facility]) - Plan: HYDROcodone-acetaminophen (NORCO/VICODIN) 5-325 MG tablet  Iron deficiency anemia due to chronic blood loss - Plan: Practitioner attestation of consent, Complete patient signature process for consent form, Care order/instruction, 0.9 %  sodium chloride infusion, sodium chloride flush (NS) 0.9 % injection 10 mL, heparin lock flush 100 unit/mL, heparin lock flush 100 unit/mL, sodium chloride flush (NS) 0.9 % injection 3 mL, Type and screen, Prepare RBC, Transfuse RBC, acetaminophen (TYLENOL) tablet 650 mg, diphenhydrAMINE (BENADRYL) capsule 25 mg  REASON FOR VISIT:    Small cell lung cancer ([place])   09/26/2015 Initial Diagnosis    Small cell lung cancer ([place])      09/26/2015 Imaging    CT CAP- Extensive malignancy in the thorax. Large right hilar/mediastinal masslike soft tissue density causing encasement and narrowing of the right mainstem bronchus and bronchus intermedius. Discrete mass difficult separate from the extensive multifo..      09/26/2015 - 09/30/2015 Hospital Admission    Epigastric pain      09/29/2015 Procedure    Bronchoscopy by Dr. Luan Pulling      09/29/2015 Pathology Results    Bronchial brushing and washing- BENIGN REACTIVE/REPARATIVE CHANGES.      10/13/2015 Procedure    VIDEO BRONCHOSCOPY WITH ENDOBRONCHIAL ULTRASOUND and MEDIASTINOSCOPY by Dr. Roxan Hockey      10/14/2015 Pathology Results    1. Lymph node, biopsy, 2R - METASTATIC SMALL CELL CARCINOMA. 2. Lymph node, biopsy, cervical - ONE BENIGN LYMPH NODE. 3. Lymph node, biopsy, 2R #2 - METASTATIC SMALL CELL CARCINOMA.      10/16/2015 -  Chemotherapy    Carboplatin/Etoposide (dose reduced based upon renal function).      10/16/2015 Code Status    DNR      10/16/2015 Imaging    [place] L upper extremity- No evidence of deep venous thrombosis.      11/05/2015 Imaging    [place] of L  LE- No evidence of deep venous thrombosis.      12/15/2015 Imaging    CT CAP- Significant interval decrease in confluent soft tissue within the right peritracheal and right hilar region. Interval decrease in size of two spiculated left upper lobe pulmonary nodules. Small pericardial effusion.      02/27/2016 Imaging    CT CAP- Interval progression of right suprahilar disease, now obliterating the airway to the apical segment right upper lobe. This is associated with interval development of new right upper lobe pulmonary nodules, suspicious for metastatic disease.      02/27/2016 Progression    CT scan demonstrates progression of disease.      History of Iron Deficiency   INTERVAL HISTORY: [name] 65 y.o. male returns for followup of Extensive Stage SCLC. 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   Patient is accompanied by his wife today. He complains of difficulty breathing and dysphagia. He notes that his breathing however is nothing like it was when he was first diagnosed with SCLC.   He notes today that he is not interested in radiation.   He is here to review recent CT scans. He is requesting refills of his pain medications.    Past Medical History:  Diagnosis Date  . Anemia    8/3,4,5- blood transfusion- [facility], colonoscopy- [name] & found 2 areas of bleeding   . Antineoplastic chemotherapy induced anemia 01/07/2016  .  Anxiety   . Arthritis    back  . AVM (arteriovenous malformation) of colon with hemorrhage   . Blood transfusion   . Carotid artery occlusion   . CHF (congestive heart failure) (HCC)    3 yrs ago  . Chronic kidney disease    renal failure /w osteomyelitis- 2010  . Chronic renal disease, stage 5, glomerular filtration rate less than or equal to 15 mL/min/1.73 square meter (HCC) 10/07/2010  . Difficulty swallowing solids   . Diverticulosis 03/04/10  . DNR (do not resuscitate) 10/16/2015   10/16/2015  . Gastritis    . GERD (gastroesophageal reflux disease)   . HLD (hyperlipidemia)   . HTN (hypertension)   . Internal hemorrhoids   . Iron deficiency anemia due to chronic blood loss 10/07/2010  . Irregular heart beat   . Lung mass 09/26/2015  . Other symptoms involving cardiovascular system   . Peripheral vascular disease, unspecified (HCC)   . Pneumonia April 2017  . Right carotid bruit   . Sacral pressure sore 11/04/2015  . Shortness of breath    noted /w low Hgb  . Tobacco use disorder   . Type II or unspecified type diabetes mellitus without mention of complication, not stated as uncontrolled   . Ulcer   . Umbilical hernia now   has not been repaired  . Undiagnosed cardiac murmurs     has Diabetes mellitus, type 2 (HCC); Hyperlipidemia; TOBACCO USER; Essential hypertension; Peripheral vascular disease (HCC); SYSTOLIC MURMUR; CAROTID BRUIT, RIGHT; CAD (coronary artery disease); CKD (chronic kidney disease) stage 4, GFR 15-29 ml/min (HCC); Amputee, below knee (HCC); Diabetic peripheral neuropathy (HCC); Iron deficiency anemia due to chronic blood loss; Noncompliance; Occlusion and stenosis of carotid artery without mention of cerebral infarction; GI bleeding; Hyponatremia; Acute renal failure (HCC); Aftercare following surgery of the circulatory system, NEC; AVM (arteriovenous malformation) of colon with hemorrhage; GERD (gastroesophageal reflux disease); Anxiety; CHF (congestive heart failure) (HCC); BPH (benign prostatic hyperplasia); Nausea without vomiting; Abdominal bloating; Dyspepsia; Dysphagia; Anemia; Constipation; Weakness; Mass of oral cavity; Left arm swelling; Pneumonia; Small cell lung cancer (HCC); DNR (do not resuscitate); Sacral pressure sore; Antineoplastic chemotherapy induced anemia; Anemia due to multiple mechanisms; and History of arteriovenous malformation (AVM) on his problem list.     is allergic to zocor [simvastatin]; asa [aspirin]; doxycycline; hctz [hydrochlorothiazide]; iodine;  and sulfa antibiotics.  [name] does not currently have medications on file.  Past Surgical History:  Procedure Laterality Date  . BELOW KNEE LEG AMPUTATION     2010, wears prosthesis   . bilateral common superficial femoral and profudus artery endarterectomies      2003   Dr. Tawanna Cooler Early  . BRONCHIAL BRUSHINGS  09/29/2015   Procedure: BRONCHIAL BRUSHINGS;  Surgeon: Kari Baars, MD;  [facility];  Service: Cardiopulmonary;;  Tracheal and left lung  . BRONCHIAL WASHINGS  09/29/2015   Procedure: BRONCHIAL WASHINGS;  Surgeon: Kari Baars, MD;  [facility];  Service: Cardiopulmonary;;  Bilatersl washings  . CAROTID ENDARTERECTOMY Left 02-09-12  . COLONOSCOPY  03/04/2010   Dr. Alysia Penna AMV without mention of ablation, scattered diverticula, internal hemorrhoids  . COLONOSCOPY  11/2004   Dr. Georgeann Oppenheim  . COLONOSCOPY  01/22/2012   NUR: Two cecal AV malformations without stigmata of bleed with maximal.meter of 8-10 mm. Both of these are ablated with argon plasma coagulator./ Small external hemorrhoids  . ENDARTERECTOMY  02/09/2012   Procedure: ENDARTERECTOMY CAROTID;  Surgeon: Larina Earthly, MD;  Location: [facility] OR;  Service: Vascular;  Laterality: Left;  . ENTEROSCOPY  01/21/2012   Procedure: ENTEROSCOPY;  Surgeon: Danie Binder, MD;  [facility];  Service: Endoscopy;;  . ESOPHAGOGASTRODUODENOSCOPY  03/2000   Dr. Tharon Aquas gastritis, H.Pylori gastritis, ?treated  . ESOPHAGOGASTRODUODENOSCOPY  05/2006   Dr. Marcello Fennel recurrent GI bleed. antral gastritis, single gastric AVM s/p APC, CLO results?  . ESOPHAGOGASTRODUODENOSCOPY  01/21/2012   SLF: MILD TO MAODERATE Gastritis/ SLOW GIB LIKEY DUE TO PT BEING ON ASA ND SUBSEQUENT BLOOD/ LOSS FROM AVMS IN STOMACH AND COLON AS WELL AS GASTRITIS  . ESOPHAGOGASTRODUODENOSCOPY N/A 09/03/2013   Mild chronic gastritis. benign gastric polyps  . ESOPHAGOGASTRODUODENOSCOPY N/A 08/19/2015   Dr. Oneida Alar: 2  large gastric AVMs in fundus, 1 actively bleeding s/p APC and clip placement, empiric Savary dilation  . FLEXIBLE BRONCHOSCOPY N/A 09/29/2015   Procedure: FLEXIBLE BRONCHOSCOPY;  Surgeon: Sinda Du, MD;  [facility];  Service: Cardiopulmonary;  Laterality: N/A;  . GIVENS CAPSULE STUDY N/A 09/03/2013   Dr. Oneida Alar: small bowel and colonic AVMs. no active bleeding  . MEDIASTINOSCOPY N/A 10/13/2015   Procedure: MEDIASTINOSCOPY;  Surgeon: Melrose Nakayama, MD;  Location: [facility];  Service: Thoracic;  Laterality: N/A;  . 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 SAVORY DILATION N/A 08/19/2015   Procedure: SAVORY DILATION;  Surgeon: Danie Binder, MD;  [facility];  Service: Endoscopy;  Laterality: N/A;  . VASCULAR SURGERY     rt bka and rt 1st toe amputation  . VIDEO BRONCHOSCOPY WITH ENDOBRONCHIAL ULTRASOUND N/A 10/13/2015   Procedure: VIDEO BRONCHOSCOPY WITH ENDOBRONCHIAL ULTRASOUND;  Surgeon: Melrose Nakayama, MD;  Location: [facility];  Service: Thoracic;  Laterality: N/A;   REVIEW OF SYSTEMS: Denies any headaches, dizziness, double vision, fevers, chills, night sweats, nausea, vomiting, diarrhea, constipation, chest pain, heart palpitations, shortness of breath, blood in stool, black tarry stool, urinary pain, urinary burning, urinary frequency, hematuria. Positive for fatigue, difficulty breathing, and dysphagia. Tired and has no energy. 14 point review of systems was performed and is negative except as detailed under history of present illness and above   PHYSICAL EXAMINATION  ECOG PERFORMANCE STATUS: 1 - Symptomatic but completely ambulatory Vitals with BMI 03/02/2016  Height   Weight 152 lbs 3 oz  BMI   Systolic 924  Diastolic 58  Pulse 89  Respirations 18   GENERAL:alert, no distress, well nourished, well developed, comfortable, cooperative, smiling and accompanied by his wife. Does not appear SOB SKIN: decubitus  resolved HEAD: Normocephalic, No masses, lesions, tenderness or abnormalities MOUTH: 1/2 cm growth noted in the posterior aspect of the right tonsillar pillar, no thrush, no mucositis (unchanged) EYES: normal, PERRLA, EOMI, Conjunctiva are pink and non-injected EARS: External ears normal OROPHARYNX:lips, buccal mucosa, and tongue normal and mucous membranes are moist  NECK: supple, thyroid normal size, non-tender, without nodularity, no stridor, non-tender, trachea midline LYMPH:  no palpable lymphadenopathy BREAST:not examined LUNGS: Normal breath sounds. HEART: regular rate & rhythm, no murmurs, no gallops, S1 normal and S2 normal ABDOMEN:abdomen soft, non-tender, normal bowel sounds and no masses or organomegaly BACK: Back symmetric, no curvature., No CVA tenderness EXTREMITIES:less then 2 second capillary refill, no joint deformities, effusion, or inflammation, no edema, no skin discoloration, Positive findings:  Right BLK amputation.   NEURO: alert & oriented x 3 with fluent speech, no focal motor/sensory deficits,   LABORATORY DATA: I have reviewed the data as listed. Results for ARLIE, RIKER (MRN  [id number]) as of 03/04/2016 18:09  Ref. Range 03/02/2016 09:20  Sodium Latest Ref Range: 135 - 145 mmol/L 137  Potassium Latest Ref Range: 3.5 - 5.1 mmol/L 4.8  Chloride Latest Ref Range: 101 - 111 mmol/L 105  CO2 Latest Ref Range: 22 - 32 mmol/L 25  BUN Latest Ref Range: 6 - 20 mg/dL 46 (H)  Creatinine Latest Ref Range: 0.61 - 1.24 mg/dL 3.54 (H)  Calcium Latest Ref Range: 8.9 - 10.3 mg/dL 8.6 (L)  EGFR (Non-African Amer.) Latest Ref Range: >60 mL/min 17 (L)  EGFR (African American) Latest Ref Range: >60 mL/min 19 (L)  Glucose Latest Ref Range: 65 - 99 mg/dL 124 (H)  Anion gap Latest Ref Range: 5 - 15  7  Alkaline Phosphatase Latest Ref Range: 38 - 126 U/L 109  Albumin Latest Ref Range: 3.5 - 5.0 g/dL 3.3 (L)  AST Latest Ref Range: 15 - 41 U/L 14 (L)  ALT Latest Ref Range: 17 -  63 U/L 8 (L)  Total Protein Latest Ref Range: 6.5 - 8.1 g/dL 6.1 (L)  Total Bilirubin Latest Ref Range: 0.3 - 1.2 mg/dL 0.5  Iron Latest Ref Range: 45 - 182 ug/dL 38 (L)  UIBC Latest Units: ug/dL 172  TIBC Latest Ref Range: 250 - 450 ug/dL 210 (L)  Saturation Ratios Latest Ref Range: 17.9 - 39.5 % 18  Ferritin Latest Ref Range: 24 - 336 ng/mL 363 (H)  WBC Latest Ref Range: 4.0 - 10.5 K/uL 5.1  RBC Latest Ref Range: 4.22 - 5.81 MIL/uL 2.82 (L)  Hemoglobin Latest Ref Range: 13.0 - 17.0 g/dL 8.8 (L)  HCT Latest Ref Range: 39.0 - 52.0 % 27.2 (L)  MCV Latest Ref Range: 78.0 - 100.0 fL 96.5  MCH Latest Ref Range: 26.0 - 34.0 pg 31.2  MCHC Latest Ref Range: 30.0 - 36.0 g/dL 32.4  RDW Latest Ref Range: 11.5 - 15.5 % 19.5 (H)  Platelets Latest Ref Range: 150 - 400 K/uL 201  Neutrophils Latest Units: % 78  Lymphocytes Latest Units: % 12  Monocytes Relative Latest Units: % 8  Eosinophil Latest Units: % 2  Basophil Latest Units: % 0  NEUT# Latest Ref Range: 1.7 - 7.7 K/uL 4.0  Lymphocyte # Latest Ref Range: 0.7 - 4.0 K/uL 0.6 (L)  Monocyte # Latest Ref Range: 0.1 - 1.0 K/uL 0.4  Eosinophils Absolute Latest Ref Range: 0.0 - 0.7 K/uL 0.1  Basophils Absolute Latest Ref Range: 0.0 - 0.1 K/uL 0.0    RADIOLOGY RESULTS: I have personally reviewed the radiological images as listed and agreed with the findings in the report. Study Result   CLINICAL DATA:  Small-cell lung cancer.  EXAM: CT CHEST, ABDOMEN AND PELVIS WITHOUT CONTRAST  TECHNIQUE: Multidetector CT imaging of the chest, abdomen and pelvis was performed following the standard protocol without IV contrast.  COMPARISON:  12/15/2015  FINDINGS: CT CHEST FINDINGS  Cardiovascular: Heart size upper normal. Trace pericardial effusion again noted. Coronary artery calcification is noted. Atherosclerotic calcification is noted in the wall of the thoracic aorta.  Mediastinum/Nodes: Abnormal soft tissue attenuation in the  central mediastinum, around the trachea, AP window, precarinal station, and subcarinal station persist. The the thickness of the right paratracheal tissue was measured previously at 1.3 cm. Given the amorphous appearance , reproducible measurement is difficult, but measuring at approximately the same level and an the same dimension as on the prior study, the appearance is stable at 1.3 cm. The abnormal soft tissue attenuation extends into the right  hilum.  Lungs/Pleura: 1.2 x 1.7 cm left upper lobe pulmonary nodule on the previous study measures 0.9 x 1.7 cm today.  Another irregular left upper lobe pulmonary nodule seen more anteriorly in the right lung was measured previously at 1.4 x 1.3 cm. This nodule now measures 1.6 x 1.2 cm.  6 mm left lower lobe pulmonary nodule measured on the prior study is stable today (image 104 series 7).  In the interval since the prior study, right suprahilar disease has progressed. There is more bulky soft tissue attenuation in the upper right hilum which causes irregularity mass-effect of the right upper lobe bronchi (see image 67 series 7 in compared to image 21 series 6 on the previous study. Progression is well demonstrated on coronal reconstructions (compare image 76 of series 3 today - demonstrating obliteration of the bronchus to the apical segment right upper lobe- 2 image 32 of series 3 previously).  2.9 x 3.0 cm right suprahilar lesion measured only about 1.2 x 1.1 cm previously. Impaction of multiple right upper lobe central airways is associated.  The patient has developed multiple new pulmonary nodules, mainly in the right upper lobe 5 mm right upper lobe nodule image 61 series 7 is new in the interval. 7 mm right upper lobe nodule seen on image 65 measured 3 mm previously.  10 mm right upper lobe nodule on image 55 is new in the interval.  Musculoskeletal: Numerous small lucencies in the vertebral bodies of the  thoracolumbar spine are stable in the interval and are adjacent to endplates, suggesting degenerative etiology.  CT ABDOMEN PELVIS FINDINGS  Hepatobiliary: No focal abnormality identified on this noncontrast CT exam. Gallbladder nondistended. No intrahepatic or extrahepatic biliary dilation.  Pancreas: No focal mass lesion. No dilatation of the main duct. No intraparenchymal cyst. No peripancreatic edema.  Spleen: No splenomegaly. No focal mass lesion.  Adrenals/Urinary Tract: No adrenal nodule or mass. Kidneys are unremarkable. No hydroureteronephrosis. The urinary bladder appears normal for the degree of distention.  Stomach/Bowel: Stomach is nondistended. No gastric wall thickening. No evidence of outlet obstruction. Metallic foreign body along the greater curvature of the stomach likely from previous upper endoscopy. Duodenum is normally positioned as is the ligament of Treitz. No small bowel wall thickening. No small bowel dilatation. The terminal ileum is normal. The appendix is normal. No gross colonic mass. No colonic wall thickening. No substantial diverticular change.  Vascular/Lymphatic: There is abdominal aortic atherosclerosis without aneurysm. There is no gastrohepatic or hepatoduodenal ligament lymphadenopathy. No intraperitoneal or retroperitoneal lymphadenopathy. No pelvic sidewall lymphadenopathy.  Reproductive: The prostate gland and seminal vesicles have normal imaging features.  Other: No intraperitoneal free fluid.  Musculoskeletal: No gross lytic or sclerotic osseous lesion identified to confirm bony metastatic involvement.  IMPRESSION: 1. Interval progression of right suprahilar disease, now obliterating the airway to the apical segment right upper lobe. This is associated with interval development of new right upper lobe pulmonary nodules, suspicious for metastatic disease. 2. Previously described left upper lobe nodules are stable  to decreased in the interval. 3. No definite findings for metastatic disease in the abdomen or pelvis on this study performed without intravenous contrast material.   Electronically Signed   By: Misty Stanley M.D.   On: 02/27/2016 16:25    PATHOLOGY:    ASSESSMENT AND PLAN:  Intestinal AVM's  Gastric AVM's Iron deficiency anemia secondary to chronic GI related blood loss CKD, Stage V EGD August 19, 2015 with gastric AVMs s/p APC therapy and clip  placement LUE swelling, negative Ultrasound on 09/01/2015 Oropharyngeal lesion Extensive Stage SCLC  Decubitus Ulcer Stage I DNR status Disease progression on CT chest 02/27/2016  I reviewed imaging in detail.  I discussed the findings regarding the RUL and explained that is probably the cause of his increasing SOB. We discussed XRT but he is absolutely opposed to this.   We discussed alternative chemotherapy options, hycamtin, taxol. We discussed no treatment and hospice.   I also recommended that he get a blood transfusion next week, counts are down and he has AVM's. He would like to do this. He also notes he will think on what he would like to do in regards to his malignancy and let me know next week.   Follow- up with Robynn Pane on 9/28.  All questions were answered. The patient knows to call the clinic with any problems, questions or concerns. We can certainly see the patient much sooner if necessary.  This document serves as a record of services personally performed by Ancil Linsey, MD. It was created on her behalf by Elmyra Ricks, a trained medical scribe. The creation of this record is based on the scribe's personal observations and the provider's statements to them. This document has been checked and approved by the attending provider.  I have reviewed the above documentation for accuracy and completeness, and I agree with the above.  This note is electronically signed by:  Molli Hazard, MD   03/02/2016 2:29 PM

## 2016-03-03 MED ORDER — HEPARIN SOD (PORK) LOCK FLUSH 100 UNIT/ML IV SOLN
INTRAVENOUS | Status: AC
  Filled 2016-03-03: qty 5

## 2016-03-04 ENCOUNTER — Encounter (HOSPITAL_COMMUNITY): Payer: Self-pay | Admitting: Hematology & Oncology

## 2016-03-04 DIAGNOSIS — I87332 Chronic venous hypertension (idiopathic) with ulcer and inflammation of left lower extremity: Secondary | ICD-10-CM | POA: Diagnosis not present

## 2016-03-04 DIAGNOSIS — Z89511 Acquired absence of right leg below knee: Secondary | ICD-10-CM | POA: Diagnosis not present

## 2016-03-07 DIAGNOSIS — R918 Other nonspecific abnormal finding of lung field: Secondary | ICD-10-CM | POA: Diagnosis not present

## 2016-03-07 DIAGNOSIS — I5020 Unspecified systolic (congestive) heart failure: Secondary | ICD-10-CM | POA: Diagnosis not present

## 2016-03-08 ENCOUNTER — Encounter (HOSPITAL_COMMUNITY): Payer: Medicare Other

## 2016-03-08 ENCOUNTER — Other Ambulatory Visit (HOSPITAL_COMMUNITY): Payer: Self-pay

## 2016-03-08 DIAGNOSIS — L8991 Pressure ulcer of unspecified site, stage 1: Secondary | ICD-10-CM | POA: Diagnosis not present

## 2016-03-08 DIAGNOSIS — D6481 Anemia due to antineoplastic chemotherapy: Secondary | ICD-10-CM | POA: Diagnosis not present

## 2016-03-08 DIAGNOSIS — D500 Iron deficiency anemia secondary to blood loss (chronic): Secondary | ICD-10-CM

## 2016-03-08 DIAGNOSIS — Z720 Tobacco use: Secondary | ICD-10-CM | POA: Diagnosis not present

## 2016-03-08 DIAGNOSIS — K429 Umbilical hernia without obstruction or gangrene: Secondary | ICD-10-CM | POA: Diagnosis not present

## 2016-03-08 DIAGNOSIS — C3490 Malignant neoplasm of unspecified part of unspecified bronchus or lung: Secondary | ICD-10-CM | POA: Diagnosis not present

## 2016-03-08 DIAGNOSIS — Z89519 Acquired absence of unspecified leg below knee: Secondary | ICD-10-CM | POA: Diagnosis not present

## 2016-03-08 DIAGNOSIS — E1142 Type 2 diabetes mellitus with diabetic polyneuropathy: Secondary | ICD-10-CM | POA: Diagnosis not present

## 2016-03-08 DIAGNOSIS — I509 Heart failure, unspecified: Secondary | ICD-10-CM | POA: Diagnosis not present

## 2016-03-08 DIAGNOSIS — I130 Hypertensive heart and chronic kidney disease with heart failure and stage 1 through stage 4 chronic kidney disease, or unspecified chronic kidney disease: Secondary | ICD-10-CM | POA: Diagnosis not present

## 2016-03-08 DIAGNOSIS — R0989 Other specified symptoms and signs involving the circulatory and respiratory systems: Secondary | ICD-10-CM | POA: Diagnosis not present

## 2016-03-08 DIAGNOSIS — I6529 Occlusion and stenosis of unspecified carotid artery: Secondary | ICD-10-CM | POA: Diagnosis not present

## 2016-03-08 DIAGNOSIS — E785 Hyperlipidemia, unspecified: Secondary | ICD-10-CM | POA: Diagnosis not present

## 2016-03-08 DIAGNOSIS — M479 Spondylosis, unspecified: Secondary | ICD-10-CM | POA: Diagnosis not present

## 2016-03-08 DIAGNOSIS — E1122 Type 2 diabetes mellitus with diabetic chronic kidney disease: Secondary | ICD-10-CM | POA: Diagnosis not present

## 2016-03-08 DIAGNOSIS — K219 Gastro-esophageal reflux disease without esophagitis: Secondary | ICD-10-CM | POA: Diagnosis not present

## 2016-03-08 DIAGNOSIS — K2970 Gastritis, unspecified, without bleeding: Secondary | ICD-10-CM | POA: Diagnosis not present

## 2016-03-08 DIAGNOSIS — N184 Chronic kidney disease, stage 4 (severe): Secondary | ICD-10-CM | POA: Diagnosis not present

## 2016-03-08 DIAGNOSIS — Q2730 Arteriovenous malformation, site unspecified: Secondary | ICD-10-CM | POA: Diagnosis not present

## 2016-03-08 LAB — CBC WITH DIFFERENTIAL/PLATELET
BASOS ABS: 0 10*3/uL (ref 0.0–0.1)
Basophils Relative: 1 %
EOS PCT: 7 %
Eosinophils Absolute: 0.3 10*3/uL (ref 0.0–0.7)
HEMATOCRIT: 25 % — AB (ref 39.0–52.0)
Hemoglobin: 7.9 g/dL — ABNORMAL LOW (ref 13.0–17.0)
LYMPHS ABS: 0.8 10*3/uL (ref 0.7–4.0)
LYMPHS PCT: 16 %
MCH: 31.7 pg (ref 26.0–34.0)
MCHC: 31.6 g/dL (ref 30.0–36.0)
MCV: 100.4 fL — AB (ref 78.0–100.0)
MONO ABS: 0.4 10*3/uL (ref 0.1–1.0)
Monocytes Relative: 8 %
NEUTROS ABS: 3.2 10*3/uL (ref 1.7–7.7)
NEUTROS PCT: 68 %
PLATELETS: 171 10*3/uL (ref 150–400)
RBC: 2.49 MIL/uL — AB (ref 4.22–5.81)
RDW: 18.4 % — AB (ref 11.5–15.5)
WBC: 4.7 10*3/uL (ref 4.0–10.5)

## 2016-03-08 LAB — PREPARE RBC (CROSSMATCH)

## 2016-03-10 ENCOUNTER — Encounter (HOSPITAL_COMMUNITY)
Admission: RE | Admit: 2016-03-10 | Discharge: 2016-03-10 | Disposition: A | Discharge status: Home / Self Care | Payer: Medicare Other | Source: Ambulatory Visit | Attending: Hematology & Oncology | Admitting: Hematology & Oncology

## 2016-03-10 DIAGNOSIS — D500 Iron deficiency anemia secondary to blood loss (chronic): Secondary | ICD-10-CM | POA: Insufficient documentation

## 2016-03-10 MED ORDER — SODIUM CHLORIDE 0.9 % IV SOLN: 250.0000 mL | Freq: Once | INTRAVENOUS | Status: DC

## 2016-03-10 MED ORDER — ACETAMINOPHEN 325 MG PO TABS
650.0000 mg | ORAL_TABLET | Freq: Once | ORAL | Status: AC
  Administered 2016-03-10: 650 mg via ORAL

## 2016-03-10 MED ORDER — DIPHENHYDRAMINE HCL 25 MG PO CAPS
ORAL_CAPSULE | ORAL | Status: AC
  Filled 2016-03-10: qty 1

## 2016-03-10 MED ORDER — SODIUM CHLORIDE 0.9% FLUSH: 10.0000 mL | INTRAVENOUS | Status: DC | PRN

## 2016-03-10 MED ORDER — HEPARIN SOD (PORK) LOCK FLUSH 100 UNIT/ML IV SOLN: 250.0000 [IU] | INTRAVENOUS | Status: DC | PRN

## 2016-03-10 MED ORDER — SODIUM CHLORIDE 0.9% FLUSH
3.0000 mL | INTRAVENOUS | Status: DC | PRN
Start: 2016-03-10 — End: 2016-03-11

## 2016-03-10 MED ORDER — HEPARIN SOD (PORK) LOCK FLUSH 100 UNIT/ML IV SOLN: 500.0000 [IU] | Freq: Every day | INTRAVENOUS | Status: DC | PRN

## 2016-03-10 MED ORDER — DIPHENHYDRAMINE HCL 25 MG PO CAPS
25.0000 mg | ORAL_CAPSULE | Freq: Once | ORAL | Status: AC
  Administered 2016-03-10: 25 mg via ORAL

## 2016-03-10 MED ORDER — ACETAMINOPHEN 325 MG PO TABS
ORAL_TABLET | ORAL | Status: AC
  Filled 2016-03-10: qty 2

## 2016-03-11 LAB — TYPE AND SCREEN
ABO/RH(D): O POS
ANTIBODY SCREEN: NEGATIVE
UNIT DIVISION: 0
Unit division: 0

## 2016-03-12 ENCOUNTER — Encounter: Payer: Medicare Other | Admitting: Nurse Practitioner

## 2016-03-15 ENCOUNTER — Ambulatory Visit (HOSPITAL_COMMUNITY): Payer: Medicare Other

## 2016-03-16 ENCOUNTER — Ambulatory Visit (INDEPENDENT_AMBULATORY_CARE_PROVIDER_SITE_OTHER): Payer: Medicare Other | Admitting: Nurse Practitioner

## 2016-03-16 ENCOUNTER — Encounter: Payer: Self-pay | Admitting: Nurse Practitioner

## 2016-03-16 VITALS — BP 162/70 | HR 88 | Temp 97.0°F | Ht 61.0 in | Wt 155.0 lb

## 2016-03-16 DIAGNOSIS — I739 Peripheral vascular disease, unspecified: Secondary | ICD-10-CM

## 2016-03-16 DIAGNOSIS — I5032 Chronic diastolic (congestive) heart failure: Secondary | ICD-10-CM

## 2016-03-16 DIAGNOSIS — Z23 Encounter for immunization: Secondary | ICD-10-CM

## 2016-03-16 DIAGNOSIS — E1142 Type 2 diabetes mellitus with diabetic polyneuropathy: Secondary | ICD-10-CM | POA: Diagnosis not present

## 2016-03-16 DIAGNOSIS — I10 Essential (primary) hypertension: Secondary | ICD-10-CM | POA: Diagnosis not present

## 2016-03-16 DIAGNOSIS — I2510 Atherosclerotic heart disease of native coronary artery without angina pectoris: Secondary | ICD-10-CM

## 2016-03-16 DIAGNOSIS — E785 Hyperlipidemia, unspecified: Secondary | ICD-10-CM

## 2016-03-16 DIAGNOSIS — Z0000 Encounter for general adult medical examination without abnormal findings: Secondary | ICD-10-CM

## 2016-03-16 LAB — BAYER DCA HB A1C WAIVED: HB A1C: 5.5 % (ref <7.0)

## 2016-03-16 NOTE — Progress Notes (Signed)
Subjective:    Patient ID: [name], male    DOB: [birth year], 65 y.o.   MRN: [id number]  HPI Patient hear today in the company of his wife, he is very hard of hearing (can not hear from the left ear)- wife relates my questions to him. Patient here today for a complete physical. He reports feeling fine and has no complaint. His last hospital admission was in April, but has been going in for blood Transfusions- his last transfusion was in a week ago. He sees Ortho for his diabetic ulcer on his foot. His chemo treatments is on hold for his lung cancer. He has follow u appointment next week to discuss his options. He refuses to have radiation treatments.  Current Outpatient Prescriptions on File Prior to Visit  Medication Sig Dispense Refill  . acetaminophen (TYLENOL) 500 MG tablet Take 1,000 mg by mouth daily as needed for mild pain or headache.    Marland Kitchen amLODipine (NORVASC) 10 MG tablet TAKE 1 TABLET BY MOUTH DAILY, BEFORE BREAKFAST 90 tablet 0  . atenolol (TENORMIN) 100 MG tablet TAKE 1/2 TABLET (50 MG TOTAL) BY MOUTH TWICE  DAILY. 90 tablet 1  . Calcium Carbonate Antacid (TUMS ULTRA 1000 PO) Take 2 tablets by mouth daily as needed (indigestion).     . CARBOPLATIN IV Inject into the vein. Day 1 every 21 days    . cloNIDine (CATAPRES) 0.2 MG tablet TAKE 1 TABLET BY MOUTH TWICE A DAY 60 tablet 3  . ETOPOSIDE IV Inject into the vein. Days 1-3 every 21 days    . furosemide (LASIX) 40 MG tablet TAKE 1 TABLET (40 MG TOTAL) BY MOUTH AS NEEDED. (Patient taking differently: TAKE 1 TABLET (40 MG TOTAL) BY MOUTH AS NEEDED FOR FLUID) 30 tablet 5  . gabapentin (NEURONTIN) 300 MG capsule Take 1 capsule (300 mg total) by mouth daily as needed (nerve pain). 270 capsule 3  . glipiZIDE (GLUCOTROL) 10 MG tablet TAKE 1 TABLET IN THE MORNING AND 1/2 TABLET IN THE EVENING (Patient taking differently: TAKE 1/2 TABLET TWICE DAILY) 135 tablet 0  . guaifenesin (HUMIBID E) 400 MG TABS tablet Take 1 tablet (400 mg  total) by mouth every 6 (six) hours. 84 tablet 0  . HYDROcodone-acetaminophen (NORCO/VICODIN) 5-325 MG tablet Take 1 tablet by mouth every 4 (four) hours as needed. 60 tablet 0  . omeprazole (PRILOSEC) 40 MG capsule     . ondansetron (ZOFRAN) 4 MG tablet Take 1 tablet (4 mg total) by mouth 4 (four) times daily -  before meals and at bedtime. (Patient taking differently: Take 4-8 mg by mouth every 8 (eight) hours as needed for nausea or vomiting. ) 120 tablet 1  . pantoprazole (PROTONIX) 40 MG tablet TAKE 1 TABLET BY MOUTH TWICE A DAY BEFORE MEAL 180 tablet 1  . Pegfilgrastim (NEULASTA ONPRO [place]) Inject into the skin. To be given 27 hours after the completion of chemo (every 21 days)    . polyethylene glycol (MIRALAX / GLYCOLAX) packet Take 17 g by mouth daily as needed for mild constipation (*tAKES [place]).     . prochlorperazine (COMPAZINE) 10 MG tablet Take 1 tablet (10 mg total) by mouth every 6 (six) hours as needed for nausea or vomiting. 30 tablet 2  . silver sulfADIAZINE (SILVADENE) 1 % cream APPLY TO AFFECTED AREA TWICE A DAY (Patient taking differently: Apply 1 application topically 2 (two) times daily as needed (sore skin). ) 240 g 1  . sodium polystyrene (  KAYEXALATE) 15 GM/60ML suspension Take 120 mLs (30 g total) by mouth once a week. 500 mL 0  . valsartan (DIOVAN) 160 MG tablet TAKE 1 TABLET BY MOUTH EVERY DAY 30 tablet 0  . Vitamin D, Ergocalciferol, (DRISDOL) 50000 units CAPS capsule Take 50,000 Units by mouth once a week. Takes on Saturdays.  0   Current Facility-Administered Medications on File Prior to Visit  Medication Dose Route Frequency Provider Last Rate Last Dose  . diphenhydrAMINE (BENADRYL) capsule 25 mg  25 mg Oral Once [employer], PA-C      . sodium chloride flush (NS) 0.9 % injection 10 mL  10 mL Intracatheter PRN [facility] [employer], PA-C      . sodium chloride flush (NS) 0.9 % injection 3 mL  3 mL Intracatheter PRN [facility], PA-C          Review of Systems  Constitutional: Negative.   HENT: Negative.   Eyes: Negative.   Respiratory: Positive for cough (intermetent). Negative for chest tightness, shortness of breath and wheezing.   Cardiovascular: Negative for chest pain, palpitations and leg swelling.  Gastrointestinal: Positive for constipation (occassional). Negative for anal bleeding, blood in stool, diarrhea and nausea.  Endocrine: Negative.   Genitourinary: Negative for decreased urine volume, frequency, hematuria and urgency.  Musculoskeletal: Negative.   Skin: Negative.   Neurological: Negative.   Hematological: Negative.   Psychiatric/Behavioral: Negative.  Negative for suicidal ideas.       Objective:   Physical Exam  Constitutional: He is oriented to person, place, and time. He appears well-developed and well-nourished.  HENT:  Head: Normocephalic and atraumatic.  Right Ear: External ear normal.  Left Ear: External ear normal.  Nose: Nose normal.  Mouth/Throat: Oropharynx is clear and moist.  Eyes: Conjunctivae are normal. Pupils are equal, round, and reactive to light. Right eye exhibits no discharge. Left eye exhibits no discharge.  Neck: Normal range of motion. Neck supple. No tracheal deviation present. No thyromegaly present.  Cardiovascular: Normal rate, regular rhythm and normal heart sounds.   Pulmonary/Chest: Effort normal. No respiratory distress.  Breath sound clear bilaterally, diminished on the RLL  Abdominal: Soft. Bowel sounds are normal. He exhibits no distension. There is no tenderness.  Musculoskeletal: He exhibits edema (left lower leg) and deformity (left toe amputation).  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 He has normal reflexes.  Skin: Skin is warm and dry.  Venous statis on the left lower leg, 2cm x1cm ulcer on dorsal part of his left foot.   BP (!) 162/70 (BP Location:  Left Arm, Cuff Size: Normal)   Pulse 88   Temp 97 F (36.1 C) (Oral)   Ht '5\' 1"'$  (1.549 m)   Wt 155 lb (70.3 kg)   BMI 29.29 kg/m   HgbA1C  5.5 today down from 5.9 on 09/27/15     Assessment & Plan:  1. Annual physical exam  - CBC with Differential/Platelet  2. Uncontrolled hypertension Strict low salt diet - CMP14+EGFR  3. Type 2 diabetes mellitus with diabetic polyneuropathy, without long-term current use of insulin (HCC) Continue low carb diet - complete foot exam done - Bayer DCA Hb A1c Waived  4. Hyperlipidemia Low fat diet - Lipid panel  5. Chronic diastolic congestive heart failure (HCC) limit fluid consumption  6. Coronary artery disease involving native coronary artery of native heart without angina pectoris  7. Peripheral vascular disease ([place]) Keep appointment with Ortho    Labs pending Health maintenance reviewed Diet and exercise encouraged Continue all meds Follow up  In 3 months   Doctor Phillips, FNP

## 2016-03-16 NOTE — Patient Instructions (Signed)
Physical  Hypertension Hypertension, commonly called high blood pressure, is when the force of blood pumping through your arteries is too strong. Your arteries are the blood vessels that carry blood from your heart throughout your body. A blood pressure reading consists of a higher number over a lower number, such as 110/72. The higher number (systolic) is the pressure inside your arteries when your heart pumps. The lower number (diastolic) is the pressure inside your arteries when your heart relaxes. Ideally you want your blood pressure below 120/80. Hypertension forces your heart to work harder to pump blood. Your arteries may become narrow or stiff. Having untreated or uncontrolled hypertension can cause heart attack, stroke, kidney disease, and other problems. RISK FACTORS Some risk factors for high blood pressure are controllable. Others are not.  Risk factors you cannot control include:   Race. You may be at higher risk if you are African American.  Age. Risk increases with age.  Gender. Men are at higher risk than women before age 48 years. After age 14, women are at higher risk than men. Risk factors you can control include:  Not getting enough exercise or physical activity.  Being overweight.  Getting too much fat, sugar, calories, or salt in your diet.  Drinking too much alcohol. SIGNS AND SYMPTOMS Hypertension does not usually cause signs or symptoms. Extremely high blood pressure (hypertensive crisis) may cause headache, anxiety, shortness of breath, and nosebleed. DIAGNOSIS To check if you have hypertension, your health care provider will measure your blood pressure while you are seated, with your arm held at the level of your heart. It should be measured at least twice using the same arm. Certain conditions can cause a difference in blood pressure between your right and left arms. A blood pressure reading that is higher than normal on one occasion does not mean that you need  treatment. If it is not clear whether you have high blood pressure, you may be asked to return on a different day to have your blood pressure checked again. Or, you may be asked to monitor your blood pressure at home for 1 or more weeks. TREATMENT Treating high blood pressure includes making lifestyle changes and possibly taking medicine. Living a healthy lifestyle can help lower high blood pressure. You may need to change some of your habits. Lifestyle changes may include:  Following the DASH diet. This diet is high in fruits, vegetables, and whole grains. It is low in salt, red meat, and added sugars.  Keep your sodium intake below 2,300 mg per day.  Getting at least 30-45 minutes of aerobic exercise at least 4 times per week.  Losing weight if necessary.  Not smoking.  Limiting alcoholic beverages.  Learning ways to reduce stress. Your health care provider may prescribe medicine if lifestyle changes are not enough to get your blood pressure under control, and if one of the following is true:  You are 40-54 years of age and your systolic blood pressure is above 140.  You are 6 years of age or older, and your systolic blood pressure is above 150.  Your diastolic blood pressure is above 90.  You have diabetes, and your systolic blood pressure is over 893 or your diastolic blood pressure is over 90.  You have kidney disease and your blood pressure is above 140/90.  You have heart disease and your blood pressure is above 140/90. Your personal target blood pressure may vary depending on your medical conditions, your age, and other factors. HOME  CARE INSTRUCTIONS  Have your blood pressure rechecked as directed by your health care provider.   Take medicines only as directed by your health care provider. Follow the directions carefully. Blood pressure medicines must be taken as prescribed. The medicine does not work as well when you skip doses. Skipping doses also puts you at risk for  problems.  Do not smoke.   Monitor your blood pressure at home as directed by your health care provider. SEEK MEDICAL CARE IF:   You think you are having a reaction to medicines taken.  You have recurrent headaches or feel dizzy.  You have swelling in your ankles.  You have trouble with your vision. SEEK IMMEDIATE MEDICAL CARE IF:  You develop a severe headache or confusion.  You have unusual weakness, numbness, or feel faint.  You have severe chest or abdominal pain.  You vomit repeatedly.  You have trouble breathing. MAKE SURE YOU:   Understand these instructions.  Will watch your condition.  Will get help right away if you are not doing well or get worse.   This information is not intended to replace advice given to you by your health care provider. Make sure you discuss any questions you have with your health care provider.   Document Released: 06/07/2005 Document Revised: 10/22/2014 Document Reviewed: 03/30/2013 Elsevier Interactive Patient Education [employer].

## 2016-03-17 ENCOUNTER — Other Ambulatory Visit: Payer: Self-pay

## 2016-03-17 ENCOUNTER — Emergency Department (HOSPITAL_COMMUNITY)
Admission: EM | Admit: 2016-03-17 | Discharge: 2016-03-17 | Disposition: A | Discharge status: Home / Self Care | Payer: Medicare Other | Attending: Emergency Medicine | Admitting: Emergency Medicine

## 2016-03-17 ENCOUNTER — Telehealth: Payer: Self-pay | Admitting: Family Medicine

## 2016-03-17 ENCOUNTER — Encounter (HOSPITAL_COMMUNITY): Payer: Self-pay

## 2016-03-17 DIAGNOSIS — D696 Thrombocytopenia, unspecified: Secondary | ICD-10-CM | POA: Diagnosis not present

## 2016-03-17 DIAGNOSIS — E1122 Type 2 diabetes mellitus with diabetic chronic kidney disease: Secondary | ICD-10-CM | POA: Insufficient documentation

## 2016-03-17 DIAGNOSIS — N189 Chronic kidney disease, unspecified: Secondary | ICD-10-CM

## 2016-03-17 DIAGNOSIS — Z87891 Personal history of nicotine dependence: Secondary | ICD-10-CM | POA: Diagnosis not present

## 2016-03-17 DIAGNOSIS — I2510 Atherosclerotic heart disease of native coronary artery without angina pectoris: Secondary | ICD-10-CM | POA: Diagnosis not present

## 2016-03-17 DIAGNOSIS — N185 Chronic kidney disease, stage 5: Secondary | ICD-10-CM | POA: Diagnosis not present

## 2016-03-17 DIAGNOSIS — L03116 Cellulitis of left lower limb: Secondary | ICD-10-CM

## 2016-03-17 DIAGNOSIS — L97529 Non-pressure chronic ulcer of other part of left foot with unspecified severity: Secondary | ICD-10-CM | POA: Diagnosis not present

## 2016-03-17 DIAGNOSIS — E875 Hyperkalemia: Secondary | ICD-10-CM | POA: Insufficient documentation

## 2016-03-17 DIAGNOSIS — L97509 Non-pressure chronic ulcer of other part of unspecified foot with unspecified severity: Secondary | ICD-10-CM | POA: Diagnosis not present

## 2016-03-17 DIAGNOSIS — D649 Anemia, unspecified: Secondary | ICD-10-CM | POA: Diagnosis not present

## 2016-03-17 DIAGNOSIS — I509 Heart failure, unspecified: Secondary | ICD-10-CM | POA: Diagnosis not present

## 2016-03-17 DIAGNOSIS — E11621 Type 2 diabetes mellitus with foot ulcer: Secondary | ICD-10-CM | POA: Insufficient documentation

## 2016-03-17 DIAGNOSIS — I132 Hypertensive heart and chronic kidney disease with heart failure and with stage 5 chronic kidney disease, or end stage renal disease: Secondary | ICD-10-CM | POA: Insufficient documentation

## 2016-03-17 DIAGNOSIS — Z7984 Long term (current) use of oral hypoglycemic drugs: Secondary | ICD-10-CM | POA: Diagnosis not present

## 2016-03-17 LAB — CBC WITH DIFFERENTIAL/PLATELET
BASOS ABS: 0 10*3/uL (ref 0.0–0.1)
BASOS PCT: 0 %
BASOS: 0 %
Basophils Absolute: 0 10*3/uL (ref 0.0–0.2)
EOS (ABSOLUTE): 0.4 10*3/uL (ref 0.0–0.4)
EOS ABS: 0.4 10*3/uL (ref 0.0–0.7)
EOS PCT: 7 %
Eos: 7 %
HEMATOCRIT: 28.9 % — AB (ref 39.0–52.0)
HEMOGLOBIN: 10.5 g/dL — AB (ref 12.6–17.7)
Hematocrit: 31.7 % — ABNORMAL LOW (ref 37.5–51.0)
Hemoglobin: 9.3 g/dL — ABNORMAL LOW (ref 13.0–17.0)
IMMATURE GRANS (ABS): 0 10*3/uL (ref 0.0–0.1)
IMMATURE GRANULOCYTES: 0 %
LYMPHS: 13 %
Lymphocytes Absolute: 0.7 10*3/uL (ref 0.7–3.1)
Lymphocytes Relative: 13 %
Lymphs Abs: 0.7 10*3/uL (ref 0.7–4.0)
MCH: 30.5 pg (ref 26.6–33.0)
MCH: 30.5 pg (ref 26.0–34.0)
MCHC: 33.1 g/dL (ref 31.5–35.7)
MCHC: 32.2 g/dL (ref 30.0–36.0)
MCV: 92 fL (ref 79–97)
MCV: 94.8 fL (ref 78.0–100.0)
MONO ABS: 0.4 10*3/uL (ref 0.1–1.0)
MONOCYTES: 6 %
MONOS PCT: 8 %
Monocytes Absolute: 0.3 10*3/uL (ref 0.1–0.9)
NEUTROS ABS: 4 10*3/uL (ref 1.4–7.0)
NEUTROS PCT: 74 %
Neutro Abs: 3.6 10*3/uL (ref 1.7–7.7)
Neutrophils Relative %: 71 %
PLATELETS: 103 10*3/uL — AB (ref 150–400)
Platelets: 120 10*3/uL — ABNORMAL LOW (ref 150–379)
RBC: 3.44 x10E6/uL — ABNORMAL LOW (ref 4.14–5.80)
RBC: 3.05 MIL/uL — ABNORMAL LOW (ref 4.22–5.81)
RDW: 17.1 % — ABNORMAL HIGH (ref 12.3–15.4)
RDW: 17.1 % — AB (ref 11.5–15.5)
WBC: 5.4 10*3/uL (ref 3.4–10.8)
WBC: 5.1 10*3/uL (ref 4.0–10.5)

## 2016-03-17 LAB — COMPREHENSIVE METABOLIC PANEL
ALBUMIN: 3.1 g/dL — AB (ref 3.5–5.0)
ALT: 8 U/L — ABNORMAL LOW (ref 17–63)
ANION GAP: 6 (ref 5–15)
AST: 13 U/L — AB (ref 15–41)
Alkaline Phosphatase: 84 U/L (ref 38–126)
BILIRUBIN TOTAL: 0.9 mg/dL (ref 0.3–1.2)
BUN: 60 mg/dL — AB (ref 6–20)
CHLORIDE: 104 mmol/L (ref 101–111)
CO2: 25 mmol/L (ref 22–32)
Calcium: 8.5 mg/dL — ABNORMAL LOW (ref 8.9–10.3)
Creatinine, Ser: 3.83 mg/dL — ABNORMAL HIGH (ref 0.61–1.24)
GFR calc Af Amer: 18 mL/min — ABNORMAL LOW (ref >60)
GFR calc non Af Amer: 15 mL/min — ABNORMAL LOW (ref >60)
GLUCOSE: 113 mg/dL — AB (ref 65–99)
POTASSIUM: 4.9 mmol/L (ref 3.5–5.1)
Sodium: 135 mmol/L (ref 135–145)
TOTAL PROTEIN: 5.9 g/dL — AB (ref 6.5–8.1)

## 2016-03-17 LAB — CMP14+EGFR
A/G RATIO: 1.5 (ref 1.2–2.2)
ALK PHOS: 113 IU/L (ref 39–117)
ALT: 9 IU/L (ref 0–44)
AST: 11 IU/L (ref 0–40)
Albumin: 3.6 g/dL (ref 3.6–4.8)
BILIRUBIN TOTAL: 0.5 mg/dL (ref 0.0–1.2)
BUN / CREAT RATIO: 15 (ref 10–24)
BUN: 63 mg/dL — ABNORMAL HIGH (ref 8–27)
CHLORIDE: 102 mmol/L (ref 96–106)
CO2: 25 mmol/L (ref 18–29)
Calcium: 9 mg/dL (ref 8.6–10.2)
Creatinine, Ser: 4.08 mg/dL (ref 0.76–1.27)
GFR calc Af Amer: 17 mL/min/{1.73_m2} — ABNORMAL LOW (ref >59)
GFR calc non Af Amer: 14 mL/min/{1.73_m2} — ABNORMAL LOW (ref >59)
GLOBULIN, TOTAL: 2.4 g/dL (ref 1.5–4.5)
Glucose: 150 mg/dL — ABNORMAL HIGH (ref 65–99)
POTASSIUM: 6 mmol/L — AB (ref 3.5–5.2)
SODIUM: 140 mmol/L (ref 134–144)
Total Protein: 6 g/dL (ref 6.0–8.5)

## 2016-03-17 LAB — LIPID PANEL
CHOLESTEROL TOTAL: 119 mg/dL (ref 100–199)
Chol/HDL Ratio: 2.5 ratio units (ref 0.0–5.0)
HDL: 47 mg/dL (ref >39)
LDL Calculated: 57 mg/dL (ref 0–99)
TRIGLYCERIDES: 77 mg/dL (ref 0–149)
VLDL Cholesterol Cal: 15 mg/dL (ref 5–40)

## 2016-03-17 MED ORDER — CLINDAMYCIN HCL 300 MG PO CAPS: 300.0000 mg | ORAL_CAPSULE | Freq: Three times a day (TID) | ORAL | 0 refills | Status: AC

## 2016-03-17 MED ORDER — SODIUM POLYSTYRENE SULFONATE 15 GM/60ML PO SUSP
30.0000 g | Freq: Once | ORAL | Status: AC
  Administered 2016-03-17: 30 g via ORAL
  Filled 2016-03-17: qty 120

## 2016-03-17 MED ORDER — CLINDAMYCIN HCL 150 MG PO CAPS
300.0000 mg | ORAL_CAPSULE | Freq: Once | ORAL | Status: AC
  Administered 2016-03-17: 300 mg via ORAL
  Filled 2016-03-17: qty 2

## 2016-03-17 MED ORDER — PROBIOTIC & ACIDOPHILUS EX ST PO CAPS: 1.0000 | ORAL_CAPSULE | Freq: Two times a day (BID) | ORAL | 0 refills | Status: AC

## 2016-03-17 NOTE — ED Notes (Signed)
Pt comes in for evaluation to high potassium. Denies any pain or sob. Denies any weakness.

## 2016-03-17 NOTE — ED Notes (Signed)
MD at bedside. 

## 2016-03-17 NOTE — ED Provider Notes (Signed)
[facility] DEPT Provider Note   CSN: [id number] Arrival date & time: 03/17/16  1248  By signing my name below, I, Rayna Sexton, attest that this documentation has been prepared under the direction and in the presence of Isla Pence, MD. Electronically Signed: Rayna Sexton, ED Scribe. 03/17/16. 1:36 PM.   History   Chief Complaint Chief Complaint  Patient presents with  . Abnormal Lab    HPI HPI Comments: [name] is a 65 y.o. male with a significant medical history listed below who presents to the Emergency Department from [facility] due to hyperkalemia. Pt was having a routine physical performed prior to being sent to the ED for further evaluation. His wife states that he was supposed to begin dialysis in March of 2017 and due to a hospitalization at the time has not yet began dialysis. Pt has a h/o DM and reports a moderate wound to his left dorsal foot for the past month. He denies he has taken abx for his symptoms but has applied neosporin w/o relief. Pt is not a current smoker but states he smoked regularly in the past. Pt denies CP, SOB or any other associated symptoms at this time.   The history is provided by the patient. No language interpreter was used.    Past Medical History:  Diagnosis Date  . Anemia    8/3,4,5- blood transfusion- [facility], colonoscopy- done & found 2 areas of bleeding   . Antineoplastic chemotherapy induced anemia 01/07/2016  . Anxiety   . Arthritis    back  . AVM (arteriovenous malformation) of colon with hemorrhage   . Blood transfusion   . Carotid artery occlusion   . CHF (congestive heart failure) ([place])    3 yrs ago  . Chronic kidney disease    renal failure /w osteomyelitis- 2010  . Chronic renal disease, stage 5, glomerular filtration rate less than or equal to 15 mL/min/1.73 square meter (HCC) 10/07/2010  . Difficulty swallowing solids   . Diverticulosis 03/04/10  . DNR (do not resuscitate) 10/16/2015   10/16/2015  .  Gastritis   . GERD (gastroesophageal reflux disease)   . HLD (hyperlipidemia)   . HTN (hypertension)   . Internal hemorrhoids   . Iron deficiency anemia due to chronic blood loss 10/07/2010  . Irregular heart beat   . Lung mass 09/26/2015  . Other symptoms involving cardiovascular system   . Peripheral vascular disease, unspecified ([place])   . Pneumonia April 2017  . Right carotid bruit   . Sacral pressure sore 11/04/2015  . Shortness of breath    noted /w low Hgb  . Tobacco use disorder   . Type II or unspecified type diabetes mellitus without mention of complication, not stated as uncontrolled   . Ulcer   . Umbilical hernia now   has not been repaired  . Undiagnosed cardiac murmurs     Patient Active Problem List   Diagnosis Date Noted  . Annual physical exam 03/16/2016  . Antineoplastic chemotherapy induced anemia 01/07/2016  . Anemia due to multiple mechanisms 01/07/2016  . History of arteriovenous malformation (AVM)   . Sacral pressure sore 11/04/2015  . DNR (do not resuscitate) 10/16/2015  . Small cell lung cancer ([place]) 10/13/2015  . Pneumonia 09/26/2015  . Weakness 08/31/2015  . Mass of oral cavity 08/31/2015  . Left arm swelling 08/31/2015  . Dyspepsia 08/18/2015  . Dysphagia 08/18/2015  . Anemia 08/18/2015  . Constipation 08/18/2015  . Nausea without vomiting 07/02/2015  . Abdominal  bloating 07/02/2015  . BPH (benign prostatic hyperplasia) 02/03/2015  . AVM (arteriovenous malformation) of colon with hemorrhage   . GERD (gastroesophageal reflux disease)   . Anxiety   . CHF (congestive heart failure) ([place])   . Aftercare following surgery of the circulatory system, [place] 03/07/2013  . GI bleeding 01/20/2012  . Hyponatremia 01/20/2012  . Acute renal failure ([place]) 01/20/2012  . Occlusion and stenosis of carotid artery without mention of cerebral infarction 01/11/2012  . CAD (coronary artery disease) 10/07/2010  . CKD (chronic kidney disease) stage 4, GFR 15-29 ml/min  (HCC) 10/07/2010  . 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 Diabetic peripheral neuropathy ([place]) 10/07/2010  . Iron deficiency anemia due to chronic blood loss 10/07/2010  . Noncompliance 10/07/2010  . Diabetes mellitus, type 2 ([place]) 11/20/2007  . Hyperlipidemia 11/20/2007  . TOBACCO USER 11/20/2007  . Essential hypertension 11/20/2007  . Peripheral vascular disease ([place]) 11/20/2007  . SYSTOLIC MURMUR 83/41/9622  . CAROTID BRUIT, RIGHT 11/20/2007    Past Surgical History:  Procedure Laterality Date  . BELOW KNEE LEG AMPUTATION     2010, wears prosthesis   . bilateral common superficial femoral and profudus artery endarterectomies      2003   Dr. Sherren Mocha Early  . BRONCHIAL BRUSHINGS  09/29/2015   Procedure: BRONCHIAL BRUSHINGS;  Surgeon: Sinda Du, MD;  [facility];  Service: Cardiopulmonary;;  Tracheal and left lung  . BRONCHIAL WASHINGS  09/29/2015   Procedure: BRONCHIAL WASHINGS;  Surgeon: Sinda Du, MD;  [facility];  Service: Cardiopulmonary;;  Bilatersl washings  . CAROTID ENDARTERECTOMY Left 02-09-12  . COLONOSCOPY  03/04/2010   Dr. Princella Pellegrini AMV without mention of ablation, scattered diverticula, internal hemorrhoids  . COLONOSCOPY  11/2004   Dr. Barkley Bruns  . COLONOSCOPY  01/22/2012   NUR: Two cecal AV malformations without stigmata of bleed with maximal.meter of 8-10 mm. Both of these are ablated with argon plasma coagulator./ Small external hemorrhoids  . ENDARTERECTOMY  02/09/2012   Procedure: ENDARTERECTOMY CAROTID;  Surgeon: Rosetta Posner, MD;  Location: [place];  Service: Vascular;  Laterality: Left;  . ENTEROSCOPY  01/21/2012   Procedure: ENTEROSCOPY;  Surgeon: Danie Binder, MD;  [facility];  Service: Endoscopy;;  . ESOPHAGOGASTRODUODENOSCOPY  03/2000   Dr. Tharon Aquas gastritis, H.Pylori gastritis, ?treated  . ESOPHAGOGASTRODUODENOSCOPY  05/2006   Dr. Marcello Fennel recurrent GI bleed. antral gastritis, single  gastric AVM s/p APC, CLO results?  . ESOPHAGOGASTRODUODENOSCOPY  01/21/2012   SLF: MILD TO MAODERATE Gastritis/ SLOW GIB LIKEY DUE TO PT BEING ON ASA ND SUBSEQUENT BLOOD/ LOSS FROM AVMS IN STOMACH AND COLON AS WELL AS GASTRITIS  . ESOPHAGOGASTRODUODENOSCOPY N/A 09/03/2013   Mild chronic gastritis. benign gastric polyps  . ESOPHAGOGASTRODUODENOSCOPY N/A 08/19/2015   Dr. Oneida Alar: 2 large gastric AVMs in fundus, 1 actively bleeding s/p APC and clip placement, empiric Savary dilation  . FLEXIBLE BRONCHOSCOPY N/A 09/29/2015   Procedure: FLEXIBLE BRONCHOSCOPY;  Surgeon: Sinda Du, MD;  [facility];  Service: Cardiopulmonary;  Laterality: N/A;  . GIVENS CAPSULE STUDY N/A 09/03/2013   Dr. Oneida Alar: small bowel and colonic AVMs. no active bleeding  . MEDIASTINOSCOPY N/A 10/13/2015   Procedure: MEDIASTINOSCOPY;  Surgeon: Melrose Nakayama, MD;  Location: [place] [facility];  Service: Thoracic;  Laterality: N/A;  . 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 SAVORY DILATION N/A 08/19/2015   Procedure: SAVORY DILATION;  Surgeon: Danie Binder, MD;  [facility];  Service: Endoscopy;  Laterality: N/A;  . VASCULAR SURGERY     rt bka and rt 1st toe amputation  . VIDEO BRONCHOSCOPY WITH ENDOBRONCHIAL ULTRASOUND N/A 10/13/2015   Procedure: VIDEO BRONCHOSCOPY WITH ENDOBRONCHIAL ULTRASOUND;  Surgeon: Melrose Nakayama, MD;  Location: [place];  Service: Thoracic;  Laterality: N/A;       Home Medications    Prior to Admission medications   Medication Sig Start Date End Date Taking? Authorizing Provider  acetaminophen (TYLENOL) 500 MG tablet Take 1,000 mg by mouth daily as needed for mild pain or headache.   Yes Historical Provider, MD  amLODipine (NORVASC) 10 MG tablet TAKE 1 TABLET BY MOUTH DAILY, BEFORE BREAKFAST 02/03/16  Yes [name] Done, FNP  atenolol (TENORMIN) 100 MG tablet TAKE 1/2 TABLET (50 MG TOTAL) BY MOUTH TWICE  DAILY. 10/14/15  Yes Donielle Liston Alba, PA-C  Calcium Carbonate Antacid (TUMS ULTRA 1000 PO) Take 2 tablets by mouth daily as needed (indigestion).    Yes Historical Provider, MD  CARBOPLATIN IV Inject into the vein. Day 1 every 21 days 10/15/15  Yes Historical Provider, MD  cloNIDine (CATAPRES) 0.2 MG tablet TAKE 1 TABLET BY MOUTH TWICE A DAY 12/03/15  Yes [name] Done, FNP  ETOPOSIDE IV Inject into the vein. Days 1-3 every 21 days 10/15/15  Yes Historical Provider, MD  furosemide (LASIX) 40 MG tablet TAKE 1 TABLET (40 MG TOTAL) BY MOUTH AS NEEDED. Patient taking differently: TAKE 1 TABLET (40 MG TOTAL) BY MOUTH AS NEEDED FOR FLUID 12/26/15  Yes [name] Done, FNP  gabapentin (NEURONTIN) 300 MG capsule Take 1 capsule (300 mg total) by mouth daily as needed (nerve pain). 10/14/15  Yes Donielle Liston Alba, PA-C  guaifenesin (HUMIBID E) 400 MG TABS tablet Take 1 tablet (400 mg total) by mouth every 6 (six) hours. 10/11/15  Yes April Palumbo, MD  polyethylene glycol [facility] / GLYCOLAX) packet Take 17 g by mouth daily as needed for mild constipation (*tAKES [place]).    Yes Historical Provider, MD  silver sulfADIAZINE (SILVADENE) 1 % cream APPLY TO AFFECTED AREA TWICE A DAY Patient taking differently: Apply 1 application topically 2 (two) times daily as needed (sore skin).  03/14/14  Yes [name] Done, FNP  sodium polystyrene (KAYEXALATE) 15 GM/60ML suspension Take 120 mLs (30 g total) by mouth once a week. 11/05/15  Yes Manon Hilding Kefalas, PA-C  clindamycin (CLEOCIN) 300 MG capsule Take 1 capsule (300 mg total) by mouth 3 (three) times daily. 03/17/16   Isla Pence, MD  glipiZIDE (GLUCOTROL) 10 MG tablet TAKE 1 TABLET IN THE MORNING AND 1/2 TABLET IN THE EVENING Patient taking differently: TAKE 1/2 TABLET TWICE DAILY 10/13/15   [name] Done, FNP  HYDROcodone-acetaminophen (NORCO/VICODIN) 5-325 MG tablet Take 1 tablet by mouth every 4 (four) hours as needed. 03/02/16   Patrici Ranks, MD    omeprazole (PRILOSEC) 40 MG capsule Take 40 mg by mouth daily.  12/30/15   Historical Provider, MD  ondansetron (ZOFRAN) 4 MG tablet Take 1 tablet (4 mg total) by mouth 4 (four) times daily -  before meals and at bedtime. Patient taking differently: Take 4-8 mg by mouth every 8 (eight) hours as needed for nausea or vomiting.  10/14/15   Donielle Liston Alba, PA-C  Pegfilgrastim (NEULASTA ONPRO [place]) Inject into the skin. To be given 27 hours after the completion of chemo (every 21 days) 10/17/15   Historical Provider, MD  Probiotic Product (PROBIOTIC & ACIDOPHILUS EX ST) CAPS Take  1 capsule by mouth 2 (two) times daily. 03/17/16   Isla Pence, MD  prochlorperazine (COMPAZINE) 10 MG tablet Take 1 tablet (10 mg total) by mouth every 6 (six) hours as needed for nausea or vomiting. 10/13/15   Patrici Ranks, MD    Family History Family History  Problem Relation Age of Onset  . Colon cancer Brother 45    deceased  . Hyperlipidemia Brother   . Hypertension Brother   . Lung cancer Sister     deceased, three primary cancers: lung, esophageal, lymphoma.   . Diabetes Sister   . Hypertension Sister   . Cancer Father     gallbladder  . Hyperlipidemia Father   . Hypertension Father   . Cancer Brother     unknown primary  . Cancer Sister     unknown primary  . Hyperlipidemia Sister   . Diabetes Mother   . Hypertension Mother     Social History Social History  Substance Use Topics  . Smoking status: Former Smoker    Packs/day: 2.00    Years: 50.00    Types: Cigarettes  . Smokeless tobacco: Former Systems developer    Quit date: 02/08/2012     Comment: Smokes about 2 packs of tiny skinny cigarettes  . Alcohol use No     Allergies   Zocor [simvastatin]; Asa [aspirin]; Doxycycline; Hctz [hydrochlorothiazide]; Iodine; and Sulfa antibiotics   Review of Systems Review of Systems  Respiratory: Negative for shortness of breath.   Cardiovascular: Negative for chest pain.  Skin: Positive for color  change and wound.  All other systems reviewed and are negative.  Physical Exam Updated Vital Signs BP 181/73   Pulse 81   Temp 98.7 F (37.1 C) (Oral)   Resp 21   Ht '5\' 1"'$  (1.549 m)   Wt 155 lb (70.3 kg)   SpO2 93%   BMI 29.29 kg/m   Physical Exam  Constitutional: He is oriented to person, place, and time. He appears well-developed and well-nourished.  HENT:  Head: Normocephalic and atraumatic.  Eyes: EOM are normal.  Neck: Normal range of motion.  Cardiovascular: Normal rate, regular rhythm and normal heart sounds.  Exam reveals no gallop and no friction rub.   No murmur heard. Pulmonary/Chest: Effort normal. No respiratory distress. He has wheezes.  Abdominal: Soft.  Musculoskeletal:  Right BKA.   Neurological: He is alert and oriented to person, place, and time.  Skin: Skin is warm and dry.  Diabetic ulcer noted to the left dorsal foot. Cellulitis noted to the LLE.  Psychiatric: He has a normal mood and affect.  Nursing note and vitals reviewed.  ED Treatments / Results  Labs (all labs ordered are listed, but only abnormal results are displayed) Labs Reviewed  CBC WITH DIFFERENTIAL/PLATELET - Abnormal; Notable for the following:       Result Value   RBC 3.05 (*)    Hemoglobin 9.3 (*)    HCT 28.9 (*)    RDW 17.1 (*)    Platelets 103 (*)    All other components within normal limits  COMPREHENSIVE METABOLIC PANEL - Abnormal; Notable for the following:    Glucose, Bld 113 (*)    BUN 60 (*)    Creatinine, Ser 3.83 (*)    Calcium 8.5 (*)    Total Protein 5.9 (*)    Albumin 3.1 (*)    AST 13 (*)    ALT 8 (*)    GFR calc non Af Amer 15 (*)  GFR calc Af Amer 18 (*)    All other components within normal limits    EKG  EKG Interpretation  Date/Time:  Wednesday March 17 2016 13:05:14 EDT Ventricular Rate:  78 PR Interval:  162 QRS Duration: 88 QT Interval:  374 QTC Calculation: 426 R Axis:   -3 Text Interpretation:  Normal sinus rhythm Minimal  voltage criteria for LVH, may be normal variant Borderline ECG qrs voltage increased since last tracing Confirmed by KNAPP  MD-J, JON ([id number]) on 03/17/2016 1:20:28 PM       Radiology No results found.  Procedures Procedures  COORDINATION OF CARE: 1:33 PM Discussed next steps with pt. Pt verbalized understanding and is agreeable with the plan.    Medications Ordered in ED Medications  sodium polystyrene (KAYEXALATE) 15 GM/60ML suspension 30 g (30 g Oral Given 03/17/16 1440)  clindamycin (CLEOCIN) capsule 300 mg (300 mg Oral Given 03/17/16 1440)    Initial Impression / Assessment and Plan / ED Course  I have reviewed the triage vital signs and the nursing notes.  Pertinent labs & imaging results that were available during my care of the patient were reviewed by me and considered in my medical decision making (see chart for details).  Clinical Course    I personally performed the services described in this documentation, which was scribed in my presence. The recorded information has been reviewed and is accurate.  Pt's potassium level was reading over 6 earlier today, but it is 4.9 now.  Pt will be given 1 dose of kayexalate.  The pt was also told to discontinue diovan by his nephrologist.  He is encouraged to do that.    The pt also has an ulcer with cellulitis to his left foot, so I will start him on clindamycin and a probiotic.  The pt knows to return if the cellulitis worsens.   Final Clinical Impressions(s) / ED Diagnoses   Final diagnoses:  Cellulitis of left lower extremity  Hyperkalemia  Diabetic ulcer of left foot associated with type 2 diabetes mellitus (HCC)  CRI (chronic renal insufficiency), unspecified stage  Chronic anemia  Thrombocytopenia (HCC)    New Prescriptions New Prescriptions   CLINDAMYCIN (CLEOCIN) 300 MG CAPSULE    Take 1 capsule (300 mg total) by mouth 3 (three) times daily.   PROBIOTIC PRODUCT (PROBIOTIC & ACIDOPHILUS EX ST) CAPS    Take 1 capsule  by mouth 2 (two) times daily.     Isla Pence, MD 03/17/16 1444

## 2016-03-17 NOTE — ED Triage Notes (Signed)
Patient sent from [facility] for hyperkalemia. Patient denies any symptoms.

## 2016-03-17 NOTE — Telephone Encounter (Signed)
Patient's wife aware and will be taking him to ER

## 2016-03-18 ENCOUNTER — Encounter (HOSPITAL_COMMUNITY): Payer: Self-pay | Admitting: Oncology

## 2016-03-18 ENCOUNTER — Encounter (HOSPITAL_BASED_OUTPATIENT_CLINIC_OR_DEPARTMENT_OTHER): Payer: Medicare Other | Admitting: Oncology

## 2016-03-18 VITALS — BP 162/55 | HR 83 | Temp 97.6°F | Resp 18 | Wt 150.0 lb

## 2016-03-18 DIAGNOSIS — E1129 Type 2 diabetes mellitus with other diabetic kidney complication: Secondary | ICD-10-CM | POA: Diagnosis not present

## 2016-03-18 DIAGNOSIS — C3490 Malignant neoplasm of unspecified part of unspecified bronchus or lung: Secondary | ICD-10-CM | POA: Diagnosis not present

## 2016-03-18 DIAGNOSIS — D6481 Anemia due to antineoplastic chemotherapy: Secondary | ICD-10-CM | POA: Diagnosis not present

## 2016-03-18 DIAGNOSIS — N184 Chronic kidney disease, stage 4 (severe): Secondary | ICD-10-CM | POA: Diagnosis not present

## 2016-03-18 DIAGNOSIS — D500 Iron deficiency anemia secondary to blood loss (chronic): Secondary | ICD-10-CM | POA: Diagnosis not present

## 2016-03-18 DIAGNOSIS — I10 Essential (primary) hypertension: Secondary | ICD-10-CM | POA: Diagnosis not present

## 2016-03-18 DIAGNOSIS — R801 Persistent proteinuria, unspecified: Secondary | ICD-10-CM | POA: Diagnosis not present

## 2016-03-18 NOTE — Assessment & Plan Note (Signed)
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   Labs performed yesterday: CBC diff.  I personally reviewed and went over laboratory results with the patient.  The results are noted within this dictation.

## 2016-03-18 NOTE — Patient Instructions (Signed)
[place] at [facility] Discharge Instructions  RECOMMENDATIONS MADE BY THE CONSULTANT AND ANY TEST RESULTS WILL BE SENT TO YOUR REFERRING PHYSICIAN.  You were seen by Gershon Mussel today Return in 4 weeks with labs  Thank you for choosing [place] at [facility] to provide your oncology and hematology care.  To afford each patient quality time with our provider, please arrive at least 15 minutes before your scheduled appointment time.   Beginning January 23rd 2017 lab work for the [facility] will be done in the  Main lab at [facility] on 1st floor. If you have a lab appointment with the [facility] please come in thru the  Main Entrance and check in at the main information desk  You need to re-schedule your appointment should you arrive 10 or more minutes late.  We strive to give you quality time with our providers, and arriving late affects you and other patients whose appointments are after yours.  Also, if you no show three or more times for appointments you may be dismissed from the clinic at the providers discretion.     Again, thank you for choosing [facility].  Our hope is that these requests will decrease the amount of time that you wait before being seen by our physicians.       _____________________________________________________________  Should you have questions after your visit to [facility], please contact our office at [phone] between the hours of 8:30 a.m. and 4:30 p.m.  Voicemails left after 4:30 p.m. will not be returned until the following business day.  For prescription refill requests, have your pharmacy contact our office.         Resources For Cancer Patients and their Caregivers ? American Cancer Society: Can assist with transportation, wigs, general needs, runs Look Good Feel Better.        781-413-7774 ? Cancer Care: Provides financial assistance, online support groups,  medication/co-pay assistance.  1-800-813-HOPE (201)236-2560) ? [facility] Assists [place] cancer patients and their families through emotional , educational and financial support.  785-041-1983 ? [place] DSS Where to apply for food stamps, Medicaid and utility assistance. (228) 562-9586 ? [facility]: Transportation to medical appointments. (901) 605-6245 ? Social Security Administration: May apply for disability if have a Stage IV cancer. 262 620 9289 2795108008 ? [employer], Disability and Transit Services: Assists with nutrition, care and transit needs. [place] Support Programs: '@10RELATIVEDAYS'$ @ > Cancer Support Group  2nd Tuesday of the month 1pm-2pm, Journey Room  > Creative Journey  3rd Tuesday of the month 1130am-1pm, Journey Room  > Look Good Feel Better  1st Wednesday of the month 10am-12 noon, Journey Room (Call [place] to register [phone])

## 2016-03-18 NOTE — Progress Notes (Signed)
[name], [place] [id number]  Small cell lung cancer, unspecified laterality [facility]) - Plan: CBC with Differential, Comprehensive metabolic panel  Iron deficiency anemia due to chronic blood loss - Plan: CBC with Differential, Iron and TIBC, Ferritin, CBC with Differential, Iron and TIBC, Ferritin, CBC with Differential, Ferritin, Iron and TIBC  CURRENT THERAPY: Discussion regarding treatment options for SCLC and IV iron replacement therapy when needed.  INTERVAL HISTORY: [name] 65 y.o. male returns for followup of Extensive stage small cell lung cancer (based upon bilateral pulmonary involvement radiographically without biopsy of left lung nodules) beginning Carboplatin/Etoposide x 6 cycles (10/16/2015- 01/30/2016).  Post-treatment imaging demonstrates progression of disease. 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     Small cell lung cancer ([place])   09/26/2015 Initial Diagnosis    Small cell lung cancer ([place])      09/26/2015 Imaging    CT CAP- Extensive malignancy in the thorax. Large right hilar/mediastinal masslike soft tissue density causing encasement and narrowing of the right mainstem bronchus and bronchus intermedius. Discrete mass difficult separate from the extensive multifo..      09/26/2015 - 09/30/2015 Hospital Admission    Epigastric pain      09/29/2015 Procedure    Bronchoscopy by Dr. Luan Pulling      09/29/2015 Pathology Results    Bronchial brushing and washing- BENIGN REACTIVE/REPARATIVE CHANGES.      10/13/2015 Procedure    VIDEO BRONCHOSCOPY WITH ENDOBRONCHIAL ULTRASOUND and MEDIASTINOSCOPY by Dr. Roxan Hockey      10/14/2015 Pathology Results    1. Lymph node, biopsy, 2R - METASTATIC SMALL CELL CARCINOMA. 2. Lymph node, biopsy, cervical - ONE BENIGN LYMPH NODE. 3. Lymph node, biopsy, 2R #2 - METASTATIC SMALL CELL CARCINOMA.      10/16/2015 - 01/30/2016 Chemotherapy   Carboplatin/Etoposide (dose reduced based upon renal function) x 6 cycles      10/16/2015 Code Status    DNR      10/16/2015 Imaging    [place] L upper extremity- No evidence of deep venous thrombosis.      11/05/2015 Imaging    [place] of L LE- No evidence of deep venous thrombosis.      12/15/2015 Imaging    CT CAP- Significant interval decrease in confluent soft tissue within the right peritracheal and right hilar region. Interval decrease in size of two spiculated left upper lobe pulmonary nodules. Small pericardial effusion.      02/27/2016 Imaging    CT CAP- Interval progression of right suprahilar disease, now obliterating the airway to the apical segment right upper lobe. This is associated with interval development of new right upper lobe pulmonary nodules, suspicious for metastatic disease.      02/27/2016 Progression    CT scan demonstrates progression of disease.      He denies any complaints today.  He has decided against second line therapy with is reasonable given the less than response rate with resistant/refractory disease.  Review of Systems  Constitutional: Negative.  Negative for chills and fever.  HENT: Negative.   Eyes: Negative for blurred vision and double vision.  Respiratory: Negative.  Negative for cough.   Cardiovascular: Negative.  Negative for chest pain.  Gastrointestinal: Negative.  Negative for constipation, diarrhea, nausea and vomiting.  Genitourinary: Negative.   Musculoskeletal: Negative.   Skin: Negative.   Neurological: Negative.  Negative for headaches.  Endo/Heme/Allergies: Negative.   Psychiatric/Behavioral: Negative.  Past Medical History:  Diagnosis Date  . Anemia    8/3,4,5- blood transfusion- [facility], colonoscopy- [name] & found 2 areas of bleeding   . Antineoplastic chemotherapy induced anemia 01/07/2016  . Anxiety   . Arthritis    back  . AVM (arteriovenous malformation) of colon with hemorrhage   . Blood transfusion   . Carotid  artery occlusion   . CHF (congestive heart failure) ([place])    3 yrs ago  . Chronic kidney disease    renal failure /w osteomyelitis- 2010  . Chronic renal disease, stage 5, glomerular filtration rate less than or equal to 15 mL/min/1.73 square meter (HCC) 10/07/2010  . Difficulty swallowing solids   . Diverticulosis 03/04/10  . DNR (do not resuscitate) 10/16/2015   10/16/2015  . Gastritis   . GERD (gastroesophageal reflux disease)   . HLD (hyperlipidemia)   . HTN (hypertension)   . Internal hemorrhoids   . Iron deficiency anemia due to chronic blood loss 10/07/2010  . Irregular heart beat   . Lung mass 09/26/2015  . Other symptoms involving cardiovascular system   . Peripheral vascular disease, unspecified ([place])   . Pneumonia April 2017  . Right carotid bruit   . Sacral pressure sore 11/04/2015  . Shortness of breath    noted /w low Hgb  . Tobacco use disorder   . Type II or unspecified type diabetes mellitus without mention of complication, not stated as uncontrolled   . Ulcer   . Umbilical hernia now   has not been repaired  . Undiagnosed cardiac murmurs     Past Surgical History:  Procedure Laterality Date  . BELOW KNEE LEG AMPUTATION     2010, wears prosthesis   . bilateral common superficial femoral and profudus artery endarterectomies      2003   Dr. Sherren Mocha Early  . BRONCHIAL BRUSHINGS  09/29/2015   Procedure: BRONCHIAL BRUSHINGS;  Surgeon: Sinda Du, MD;  [facility];  Service: Cardiopulmonary;;  Tracheal and left lung  . BRONCHIAL WASHINGS  09/29/2015   Procedure: BRONCHIAL WASHINGS;  Surgeon: Sinda Du, MD;  [facility];  Service: Cardiopulmonary;;  Bilatersl washings  . CAROTID ENDARTERECTOMY Left 02-09-12  . COLONOSCOPY  03/04/2010   Dr. Princella Pellegrini AMV without mention of ablation, scattered diverticula, internal hemorrhoids  . COLONOSCOPY  11/2004   Dr. Barkley Bruns  . COLONOSCOPY  01/22/2012   NUR: Two cecal AV malformations  without stigmata of bleed with maximal.meter of 8-10 mm. Both of these are ablated with argon plasma coagulator./ Small external hemorrhoids  . ENDARTERECTOMY  02/09/2012   Procedure: ENDARTERECTOMY CAROTID;  Surgeon: Rosetta Posner, MD;  Location: [place];  Service: Vascular;  Laterality: Left;  . ENTEROSCOPY  01/21/2012   Procedure: ENTEROSCOPY;  Surgeon: Danie Binder, MD;  [facility];  Service: Endoscopy;;  . ESOPHAGOGASTRODUODENOSCOPY  03/2000   Dr. Tharon Aquas gastritis, H.Pylori gastritis, ?treated  . ESOPHAGOGASTRODUODENOSCOPY  05/2006   Dr. Marcello Fennel recurrent GI bleed. antral gastritis, single gastric AVM s/p APC, CLO results?  . ESOPHAGOGASTRODUODENOSCOPY  01/21/2012   SLF: MILD TO MAODERATE Gastritis/ SLOW GIB LIKEY DUE TO PT BEING ON ASA ND SUBSEQUENT BLOOD/ LOSS FROM AVMS IN STOMACH AND COLON AS WELL AS GASTRITIS  . ESOPHAGOGASTRODUODENOSCOPY N/A 09/03/2013   Mild chronic gastritis. benign gastric polyps  . ESOPHAGOGASTRODUODENOSCOPY N/A 08/19/2015   Dr. Oneida Alar: 2 large gastric AVMs in fundus, 1 actively bleeding s/p APC and clip placement, empiric Savary dilation  . FLEXIBLE BRONCHOSCOPY N/A  09/29/2015   Procedure: FLEXIBLE BRONCHOSCOPY;  Surgeon: Sinda Du, MD;  [facility];  Service: Cardiopulmonary;  Laterality: N/A;  . GIVENS CAPSULE STUDY N/A 09/03/2013   Dr. Oneida Alar: small bowel and colonic AVMs. no active bleeding  . MEDIASTINOSCOPY N/A 10/13/2015   Procedure: MEDIASTINOSCOPY;  Surgeon: Melrose Nakayama, MD;  Location: [place];  Service: Thoracic;  Laterality: N/A;  . 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 SAVORY DILATION N/A 08/19/2015   Procedure: SAVORY DILATION;  Surgeon: Danie Binder, MD;  [facility];  Service: Endoscopy;  Laterality: N/A;  . VASCULAR SURGERY     rt bka and rt 1st toe amputation  . VIDEO BRONCHOSCOPY WITH ENDOBRONCHIAL ULTRASOUND N/A 10/13/2015   Procedure: VIDEO  BRONCHOSCOPY WITH ENDOBRONCHIAL ULTRASOUND;  Surgeon: Melrose Nakayama, MD;  Location: [facility];  Service: Thoracic;  Laterality: N/A;    Family History  Problem Relation Age of Onset  . Colon cancer Brother 64    deceased  . Hyperlipidemia Brother   . Hypertension Brother   . Lung cancer Sister     deceased, three primary cancers: lung, esophageal, lymphoma.   . Diabetes Sister   . Hypertension Sister   . Cancer Father     gallbladder  . Hyperlipidemia Father   . Hypertension Father   . Cancer Brother     unknown primary  . Cancer Sister     unknown primary  . Hyperlipidemia Sister   . Diabetes Mother   . Hypertension Mother     Social History   Social History  . Marital status: Married    Spouse name: N/A  . Number of children: N/A  . Years of education: N/A   Social History Main Topics  . Smoking status: Former Smoker    Packs/day: 2.00    Years: 50.00    Types: Cigarettes  . Smokeless tobacco: Former Systems developer    Quit date: 02/08/2012     Comment: Smokes about 2 packs of tiny skinny cigarettes  . Alcohol use No  . Drug use: No  . Sexual activity: Not Asked   Other Topics Concern  . None   Social History Narrative  . None     PHYSICAL EXAMINATION  ECOG PERFORMANCE STATUS: 2 - Symptomatic, <50% confined to bed  Vitals:   03/18/16 1000  BP: (!) 162/55  Pulse: 83  Resp: 18  Temp: 97.6 F (36.4 C)     GENERAL:alert, no distress, comfortable, cooperative, smiling and chronically ill appearing, accompanied by his wife and daughter. SKIN: skin color, texture, turgor are normal, no rashes or significant lesions. HEAD: Normocephalic, No masses, lesions, tenderness or abnormalities EARS: External ears normal OROPHARYNX:lips, buccal mucosa, and tongue normal and mucous membranes are moist  NECK: trachea midline LYMPH:  Not examined BREAST:not examined LUNGS: not examined HEART: not examined ABDOMEN:abdomen soft and normal bowel sounds BACK: Back  symmetric, no curvature. EXTREMITIES:less then 2 second capillary refill, no skin discoloration, no cyanosis, right above the knee amputation NEURO: alert & oriented x 3 with fluent speech, no focal motor/sensory deficits   LABORATORY DATA: CBC    Component Value Date/Time   WBC 5.1 03/17/2016 1327   RBC 3.05 (L) 03/17/2016 1327   HGB 9.3 (L) 03/17/2016 1327   HCT 28.9 (L) 03/17/2016 1327   HCT 31.7 (L) 03/16/2016 0909   PLT 103 (L) 03/17/2016 1327   PLT 120 (L) 03/16/2016 0909   MCV 94.8 03/17/2016  1327   MCV 92 03/16/2016 0909   MCH 30.5 03/17/2016 1327   MCHC 32.2 03/17/2016 1327   RDW 17.1 (H) 03/17/2016 1327   RDW 17.1 (H) 03/16/2016 0909   LYMPHSABS 0.7 03/17/2016 1327   LYMPHSABS 0.7 03/16/2016 0909   MONOABS 0.4 03/17/2016 1327   EOSABS 0.4 03/17/2016 1327   EOSABS 0.4 03/16/2016 0909   BASOSABS 0.0 03/17/2016 1327   BASOSABS 0.0 03/16/2016 0909      Chemistry      Component Value Date/Time   NA 135 03/17/2016 1327   NA 140 03/16/2016 0909   K 4.9 03/17/2016 1327   CL 104 03/17/2016 1327   CO2 25 03/17/2016 1327   BUN 60 (H) 03/17/2016 1327   BUN 63 (H) 03/16/2016 0909   CREATININE 3.83 (H) 03/17/2016 1327   CREATININE 1.59 (H) 12/06/2012 0852      Component Value Date/Time   CALCIUM 8.5 (L) 03/17/2016 1327   CALCIUM 8.4 (L) 09/03/2015 1230   ALKPHOS 84 03/17/2016 1327   AST 13 (L) 03/17/2016 1327   ALT 8 (L) 03/17/2016 1327   BILITOT 0.9 03/17/2016 1327   BILITOT 0.5 03/16/2016 0909      Lab Results  Component Value Date   IRON 38 (L) 03/02/2016   TIBC 210 (L) 03/02/2016   FERRITIN 363 (H) 03/02/2016     PENDING LABS:   RADIOGRAPHIC STUDIES:  Ct Abdomen Pelvis Wo Contrast  Result Date: 02/27/2016 CLINICAL DATA:  Small-cell lung cancer. EXAM: CT CHEST, ABDOMEN AND PELVIS WITHOUT CONTRAST TECHNIQUE: Multidetector CT imaging of the chest, abdomen and pelvis was performed following the standard protocol without IV contrast. COMPARISON:   12/15/2015 FINDINGS: CT CHEST FINDINGS Cardiovascular: Heart size upper normal. Trace pericardial effusion again noted. Coronary artery calcification is noted. Atherosclerotic calcification is noted in the wall of the thoracic aorta. Mediastinum/Nodes: Abnormal soft tissue attenuation in the central mediastinum, around the trachea, AP window, precarinal station, and subcarinal station persist. The the thickness of the right paratracheal tissue was measured previously at 1.3 cm. Given the amorphous appearance , reproducible measurement is difficult, but measuring at approximately the same level and an the same dimension as on the prior study, the appearance is stable at 1.3 cm. The abnormal soft tissue attenuation extends into the right hilum. Lungs/Pleura: 1.2 x 1.7 cm left upper lobe pulmonary nodule on the previous study measures 0.9 x 1.7 cm today. Another irregular left upper lobe pulmonary nodule seen more anteriorly in the right lung was measured previously at 1.4 x 1.3 cm. This nodule now measures 1.6 x 1.2 cm. 6 mm left lower lobe pulmonary nodule measured on the prior study is stable today (image 104 series 7). In the interval since the prior study, right suprahilar disease has progressed. There is more bulky soft tissue attenuation in the upper right hilum which causes irregularity mass-effect of the right upper lobe bronchi (see image 67 series 7 in compared to image 21 series 6 on the previous study. Progression is well demonstrated on coronal reconstructions (compare image 76 of series 3 today - demonstrating obliteration of the bronchus to the apical segment right upper lobe- 2 image 32 of series 3 previously). 2.9 x 3.0 cm right suprahilar lesion measured only about 1.2 x 1.1 cm previously. Impaction of multiple right upper lobe central airways is associated. The patient has developed multiple new pulmonary nodules, mainly in the right upper lobe 5 mm right upper lobe nodule image 61 series 7 is new  in  the interval. 7 mm right upper lobe nodule seen on image 65 measured 3 mm previously. 10 mm right upper lobe nodule on image 55 is new in the interval. Musculoskeletal: Numerous small lucencies in the vertebral bodies of the thoracolumbar spine are stable in the interval and are adjacent to endplates, suggesting degenerative etiology. CT ABDOMEN PELVIS FINDINGS Hepatobiliary: No focal abnormality identified on this noncontrast CT exam. Gallbladder nondistended. No intrahepatic or extrahepatic biliary dilation. Pancreas: No focal mass lesion. No dilatation of the main duct. No intraparenchymal cyst. No peripancreatic edema. Spleen: No splenomegaly. No focal mass lesion. Adrenals/Urinary Tract: No adrenal nodule or mass. Kidneys are unremarkable. No hydroureteronephrosis. The urinary bladder appears normal for the degree of distention. Stomach/Bowel: Stomach is nondistended. No gastric wall thickening. No evidence of outlet obstruction. Metallic foreign body along the greater curvature of the stomach likely from previous upper endoscopy. Duodenum is normally positioned as is the ligament of Treitz. No small bowel wall thickening. No small bowel dilatation. The terminal ileum is normal. The appendix is normal. No gross colonic mass. No colonic wall thickening. No substantial diverticular change. Vascular/Lymphatic: There is abdominal aortic atherosclerosis without aneurysm. There is no gastrohepatic or hepatoduodenal ligament lymphadenopathy. No intraperitoneal or retroperitoneal lymphadenopathy. No pelvic sidewall lymphadenopathy. Reproductive: The prostate gland and seminal vesicles have normal imaging features. Other: No intraperitoneal free fluid. Musculoskeletal: No gross lytic or sclerotic osseous lesion identified to confirm bony metastatic involvement. IMPRESSION: 1. Interval progression of right suprahilar disease, now obliterating the airway to the apical segment right upper lobe. This is associated with  interval development of new right upper lobe pulmonary nodules, suspicious for metastatic disease. 2. Previously described left upper lobe nodules are stable to decreased in the interval. 3. No definite findings for metastatic disease in the abdomen or pelvis on this study performed without intravenous contrast material. Electronically Signed   By: Misty Stanley M.D.   On: 02/27/2016 16:25   Ct Chest Wo Contrast  Result Date: 02/27/2016 CLINICAL DATA:  Small-cell lung cancer. EXAM: CT CHEST, ABDOMEN AND PELVIS WITHOUT CONTRAST TECHNIQUE: Multidetector CT imaging of the chest, abdomen and pelvis was performed following the standard protocol without IV contrast. COMPARISON:  12/15/2015 FINDINGS: CT CHEST FINDINGS Cardiovascular: Heart size upper normal. Trace pericardial effusion again noted. Coronary artery calcification is noted. Atherosclerotic calcification is noted in the wall of the thoracic aorta. Mediastinum/Nodes: Abnormal soft tissue attenuation in the central mediastinum, around the trachea, AP window, precarinal station, and subcarinal station persist. The the thickness of the right paratracheal tissue was measured previously at 1.3 cm. Given the amorphous appearance , reproducible measurement is difficult, but measuring at approximately the same level and an the same dimension as on the prior study, the appearance is stable at 1.3 cm. The abnormal soft tissue attenuation extends into the right hilum. Lungs/Pleura: 1.2 x 1.7 cm left upper lobe pulmonary nodule on the previous study measures 0.9 x 1.7 cm today. Another irregular left upper lobe pulmonary nodule seen more anteriorly in the right lung was measured previously at 1.4 x 1.3 cm. This nodule now measures 1.6 x 1.2 cm. 6 mm left lower lobe pulmonary nodule measured on the prior study is stable today (image 104 series 7). In the interval since the prior study, right suprahilar disease has progressed. There is more bulky soft tissue attenuation in  the upper right hilum which causes irregularity mass-effect of the right upper lobe bronchi (see image 67 series 7 in compared to image 21 series 6  on the previous study. Progression is well demonstrated on coronal reconstructions (compare image 76 of series 3 today - demonstrating obliteration of the bronchus to the apical segment right upper lobe- 2 image 32 of series 3 previously). 2.9 x 3.0 cm right suprahilar lesion measured only about 1.2 x 1.1 cm previously. Impaction of multiple right upper lobe central airways is associated. The patient has developed multiple new pulmonary nodules, mainly in the right upper lobe 5 mm right upper lobe nodule image 61 series 7 is new in the interval. 7 mm right upper lobe nodule seen on image 65 measured 3 mm previously. 10 mm right upper lobe nodule on image 55 is new in the interval. Musculoskeletal: Numerous small lucencies in the vertebral bodies of the thoracolumbar spine are stable in the interval and are adjacent to endplates, suggesting degenerative etiology. CT ABDOMEN PELVIS FINDINGS Hepatobiliary: No focal abnormality identified on this noncontrast CT exam. Gallbladder nondistended. No intrahepatic or extrahepatic biliary dilation. Pancreas: No focal mass lesion. No dilatation of the main duct. No intraparenchymal cyst. No peripancreatic edema. Spleen: No splenomegaly. No focal mass lesion. Adrenals/Urinary Tract: No adrenal nodule or mass. Kidneys are unremarkable. No hydroureteronephrosis. The urinary bladder appears normal for the degree of distention. Stomach/Bowel: Stomach is nondistended. No gastric wall thickening. No evidence of outlet obstruction. Metallic foreign body along the greater curvature of the stomach likely from previous upper endoscopy. Duodenum is normally positioned as is the ligament of Treitz. No small bowel wall thickening. No small bowel dilatation. The terminal ileum is normal. The appendix is normal. No gross colonic mass. No colonic  wall thickening. No substantial diverticular change. Vascular/Lymphatic: There is abdominal aortic atherosclerosis without aneurysm. There is no gastrohepatic or hepatoduodenal ligament lymphadenopathy. No intraperitoneal or retroperitoneal lymphadenopathy. No pelvic sidewall lymphadenopathy. Reproductive: The prostate gland and seminal vesicles have normal imaging features. Other: No intraperitoneal free fluid. Musculoskeletal: No gross lytic or sclerotic osseous lesion identified to confirm bony metastatic involvement. IMPRESSION: 1. Interval progression of right suprahilar disease, now obliterating the airway to the apical segment right upper lobe. This is associated with interval development of new right upper lobe pulmonary nodules, suspicious for metastatic disease. 2. Previously described left upper lobe nodules are stable to decreased in the interval. 3. No definite findings for metastatic disease in the abdomen or pelvis on this study performed without intravenous contrast material. Electronically Signed   By: Misty Stanley M.D.   On: 02/27/2016 16:25     PATHOLOGY:    ASSESSMENT AND PLAN:  Small cell lung cancer ([place]) Extensive stage small cell lung cancer status post VATS with endobronchial ultrasound and mediastinal endoscopy by Dr. Roxan Hockey on 10/13/2015 resulting in aforementioned diagnosis. Carboplatin/Etoposide x 6 cycles (10/16/2015- 01/30/2016).  Post-treatment imaging demonstrates progression of disease.  Oncology history updated.  Patients with extended stage disease, the median survival is 8 to 13 months, and the five-year survival rate is 1 to 2 percent.  In most series, less than 5 percent of those with Extensive Stage SCLC survive beyond two years.  Prophylactic cranial irradiation decreases the incidence of symptomatic brain metastases in patients who have responded to systemic chemotherapy, although its impact on overall survival is uncertain. For patients with a response to  systemic chemotherapy, thoracic radiation may be of benefit in increasing the percentage of long-term survivors.   DNR   Labs yesterday reviewed from ED: CBC diff, CMET.  I personally reviewed and went over laboratory results with the patient.  The results are noted  within this dictation.  ED note is reviewed.  Patient referred to ED by primary care provider for hyperkalemia.  This was addressed and managed in ED with kayexalate.  Diovan was discontinued as well.  On previous encounter, imaging was reviewed with the patient.  Further treatment options were discussed: Topotecan vs Taxol vs Hospice.  He is here today to further discuss options and help with decision making.  I personally reviewed and went over radiographic studies with the patient.  The results are noted within this dictation.  He wants to see the images personally.  As a result, these images were reviewed on the computer.  He has decided not to pursue treatment.  HOWEVER, he is not interested in Hospice.  We had a discussion about his wishes.  He wants to continue to with routine blood work to monitor his blood counts and iron studies.  His wife reports that they will be more interested in Hospice when he becomes more debilitated.  Labs every 2 weeks CBC diff, iron/TIBC, ferritin.  Labs in 4-6 weeks: renal panel.  Return in 4-6 weeks for follow-up.  40 minutes spent in face to face discussion and 50 minutes spent on entire encounter.  More than 50% of the time spent with the patient was utilized for counseling and coordination of care.   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   Labs performed yesterday: CBC diff.  I personally reviewed and went over laboratory results with the patient.  The  results are noted within this dictation.   ORDERS PLACED FOR THIS ENCOUNTER: Orders Placed This Encounter  Procedures  . CBC with Differential  . Iron and TIBC  . Ferritin  . CBC with Differential  . Comprehensive metabolic panel  . Iron and TIBC  . Ferritin  . CBC with Differential  . Ferritin  . Iron and TIBC    MEDICATIONS PRESCRIBED THIS ENCOUNTER: Meds ordered this encounter  Medications  . valsartan (DIOVAN) 160 MG tablet    THERAPY PLAN:  Comfort care only.  All questions were answered. The patient knows to call the clinic with any problems, questions or concerns. We can certainly see the patient much sooner if necessary.  Patient and plan discussed with Dr. Ancil Linsey and she is in agreement with the aforementioned.   This note is electronically signed by: Doy Mince 03/18/2016 6:34 PM

## 2016-03-18 NOTE — Assessment & Plan Note (Addendum)
Extensive stage small cell lung cancer status post VATS with endobronchial ultrasound and mediastinal endoscopy by Dr. Roxan Hockey on 10/13/2015 resulting in aforementioned diagnosis. Carboplatin/Etoposide x 6 cycles (10/16/2015- 01/30/2016).  Post-treatment imaging demonstrates progression of disease.  Oncology history updated.  Patients with extended stage disease, the median survival is 8 to 13 months, and the five-year survival rate is 1 to 2 percent.  In most series, less than 5 percent of those with Extensive Stage SCLC survive beyond two years.  Prophylactic cranial irradiation decreases the incidence of symptomatic brain metastases in patients who have responded to systemic chemotherapy, although its impact on overall survival is uncertain. For patients with a response to systemic chemotherapy, thoracic radiation may be of benefit in increasing the percentage of long-term survivors.   DNR   Labs yesterday reviewed from ED: CBC diff, CMET.  I personally reviewed and went over laboratory results with the patient.  The results are noted within this dictation.  ED note is reviewed.  Patient referred to ED by primary care provider for hyperkalemia.  This was addressed and managed in ED with kayexalate.  Diovan was discontinued as well.  On previous encounter, imaging was reviewed with the patient.  Further treatment options were discussed: Topotecan vs Taxol vs Hospice.  He is here today to further discuss options and help with decision making.  I personally reviewed and went over radiographic studies with the patient.  The results are noted within this dictation.  He wants to see the images personally.  As a result, these images were reviewed on the computer.  He has decided not to pursue treatment.  HOWEVER, he is not interested in Hospice.  We had a discussion about his wishes.  He wants to continue to with routine blood work to monitor his blood counts and iron studies.  His wife reports that  they will be more interested in Hospice when he becomes more debilitated.  Labs every 2 weeks CBC diff, iron/TIBC, ferritin.  Labs in 4-6 weeks: renal panel.  Return in 4-6 weeks for follow-up.  40 minutes spent in face to face discussion and 50 minutes spent on entire encounter.  More than 50% of the time spent with the patient was utilized for counseling and coordination of care.

## 2016-03-22 ENCOUNTER — Telehealth: Payer: Self-pay | Admitting: Nurse Practitioner

## 2016-03-22 NOTE — Telephone Encounter (Signed)
Patient's wife aware of recommendation 

## 2016-03-22 NOTE — Telephone Encounter (Signed)
Needs to do what they recommended

## 2016-04-01 ENCOUNTER — Encounter (HOSPITAL_COMMUNITY): Payer: Self-pay | Admitting: Hematology & Oncology

## 2016-04-01 ENCOUNTER — Encounter (HOSPITAL_COMMUNITY): Payer: Medicare Other | Attending: Hematology & Oncology

## 2016-04-01 DIAGNOSIS — I509 Heart failure, unspecified: Secondary | ICD-10-CM | POA: Diagnosis not present

## 2016-04-01 DIAGNOSIS — N184 Chronic kidney disease, stage 4 (severe): Secondary | ICD-10-CM | POA: Diagnosis not present

## 2016-04-01 DIAGNOSIS — M479 Spondylosis, unspecified: Secondary | ICD-10-CM | POA: Insufficient documentation

## 2016-04-01 DIAGNOSIS — E1122 Type 2 diabetes mellitus with diabetic chronic kidney disease: Secondary | ICD-10-CM | POA: Diagnosis not present

## 2016-04-01 DIAGNOSIS — I130 Hypertensive heart and chronic kidney disease with heart failure and stage 1 through stage 4 chronic kidney disease, or unspecified chronic kidney disease: Secondary | ICD-10-CM | POA: Diagnosis not present

## 2016-04-01 DIAGNOSIS — F419 Anxiety disorder, unspecified: Secondary | ICD-10-CM | POA: Insufficient documentation

## 2016-04-01 DIAGNOSIS — E785 Hyperlipidemia, unspecified: Secondary | ICD-10-CM | POA: Insufficient documentation

## 2016-04-01 DIAGNOSIS — E1142 Type 2 diabetes mellitus with diabetic polyneuropathy: Secondary | ICD-10-CM | POA: Insufficient documentation

## 2016-04-01 DIAGNOSIS — K429 Umbilical hernia without obstruction or gangrene: Secondary | ICD-10-CM | POA: Insufficient documentation

## 2016-04-01 DIAGNOSIS — I6529 Occlusion and stenosis of unspecified carotid artery: Secondary | ICD-10-CM | POA: Insufficient documentation

## 2016-04-01 DIAGNOSIS — K219 Gastro-esophageal reflux disease without esophagitis: Secondary | ICD-10-CM | POA: Diagnosis not present

## 2016-04-01 DIAGNOSIS — D500 Iron deficiency anemia secondary to blood loss (chronic): Secondary | ICD-10-CM | POA: Diagnosis not present

## 2016-04-01 DIAGNOSIS — K2970 Gastritis, unspecified, without bleeding: Secondary | ICD-10-CM | POA: Insufficient documentation

## 2016-04-01 DIAGNOSIS — D6481 Anemia due to antineoplastic chemotherapy: Secondary | ICD-10-CM | POA: Diagnosis not present

## 2016-04-01 DIAGNOSIS — L8991 Pressure ulcer of unspecified site, stage 1: Secondary | ICD-10-CM | POA: Insufficient documentation

## 2016-04-01 DIAGNOSIS — Q2730 Arteriovenous malformation, site unspecified: Secondary | ICD-10-CM | POA: Diagnosis not present

## 2016-04-01 DIAGNOSIS — R0989 Other specified symptoms and signs involving the circulatory and respiratory systems: Secondary | ICD-10-CM | POA: Insufficient documentation

## 2016-04-01 DIAGNOSIS — Z720 Tobacco use: Secondary | ICD-10-CM | POA: Diagnosis not present

## 2016-04-01 DIAGNOSIS — C3490 Malignant neoplasm of unspecified part of unspecified bronchus or lung: Secondary | ICD-10-CM | POA: Insufficient documentation

## 2016-04-01 DIAGNOSIS — Z89519 Acquired absence of unspecified leg below knee: Secondary | ICD-10-CM | POA: Insufficient documentation

## 2016-04-01 LAB — CBC WITH DIFFERENTIAL/PLATELET
Basophils Absolute: 0 10*3/uL (ref 0.0–0.1)
Basophils Relative: 0 %
Eosinophils Absolute: 0.3 10*3/uL (ref 0.0–0.7)
Eosinophils Relative: 7 %
HEMATOCRIT: 29.1 % — AB (ref 39.0–52.0)
HEMOGLOBIN: 9.5 g/dL — AB (ref 13.0–17.0)
LYMPHS ABS: 0.8 10*3/uL (ref 0.7–4.0)
Lymphocytes Relative: 20 %
MCH: 30.8 pg (ref 26.0–34.0)
MCHC: 32.6 g/dL (ref 30.0–36.0)
MCV: 94.5 fL (ref 78.0–100.0)
MONOS PCT: 7 %
Monocytes Absolute: 0.3 10*3/uL (ref 0.1–1.0)
NEUTROS ABS: 2.7 10*3/uL (ref 1.7–7.7)
NEUTROS PCT: 66 %
Platelets: 137 10*3/uL — ABNORMAL LOW (ref 150–400)
RBC: 3.08 MIL/uL — ABNORMAL LOW (ref 4.22–5.81)
RDW: 16 % — ABNORMAL HIGH (ref 11.5–15.5)
WBC: 4.2 10*3/uL (ref 4.0–10.5)

## 2016-04-01 LAB — IRON AND TIBC
Iron: 22 ug/dL — ABNORMAL LOW (ref 45–182)
Saturation Ratios: 12 % — ABNORMAL LOW (ref 17.9–39.5)
TIBC: 186 ug/dL — ABNORMAL LOW (ref 250–450)
UIBC: 164 ug/dL

## 2016-04-01 LAB — FERRITIN: FERRITIN: 406 ng/mL — AB (ref 24–336)

## 2016-04-06 DIAGNOSIS — R918 Other nonspecific abnormal finding of lung field: Secondary | ICD-10-CM | POA: Diagnosis not present

## 2016-04-06 DIAGNOSIS — I5020 Unspecified systolic (congestive) heart failure: Secondary | ICD-10-CM | POA: Diagnosis not present

## 2016-04-16 ENCOUNTER — Encounter (HOSPITAL_BASED_OUTPATIENT_CLINIC_OR_DEPARTMENT_OTHER): Payer: Medicare Other | Admitting: Oncology

## 2016-04-16 ENCOUNTER — Encounter (HOSPITAL_COMMUNITY): Payer: Medicare Other

## 2016-04-16 ENCOUNTER — Encounter (HOSPITAL_COMMUNITY): Payer: Self-pay | Admitting: Oncology

## 2016-04-16 VITALS — BP 145/59 | HR 76 | Temp 98.2°F | Resp 18 | Wt 145.3 lb

## 2016-04-16 DIAGNOSIS — C3401 Malignant neoplasm of right main bronchus: Secondary | ICD-10-CM | POA: Diagnosis not present

## 2016-04-16 DIAGNOSIS — Z89519 Acquired absence of unspecified leg below knee: Secondary | ICD-10-CM | POA: Diagnosis not present

## 2016-04-16 DIAGNOSIS — D6481 Anemia due to antineoplastic chemotherapy: Secondary | ICD-10-CM | POA: Diagnosis not present

## 2016-04-16 DIAGNOSIS — I509 Heart failure, unspecified: Secondary | ICD-10-CM | POA: Diagnosis not present

## 2016-04-16 DIAGNOSIS — I6529 Occlusion and stenosis of unspecified carotid artery: Secondary | ICD-10-CM | POA: Diagnosis not present

## 2016-04-16 DIAGNOSIS — C3490 Malignant neoplasm of unspecified part of unspecified bronchus or lung: Secondary | ICD-10-CM

## 2016-04-16 DIAGNOSIS — N184 Chronic kidney disease, stage 4 (severe): Secondary | ICD-10-CM | POA: Diagnosis not present

## 2016-04-16 DIAGNOSIS — Z720 Tobacco use: Secondary | ICD-10-CM | POA: Diagnosis not present

## 2016-04-16 DIAGNOSIS — R0989 Other specified symptoms and signs involving the circulatory and respiratory systems: Secondary | ICD-10-CM | POA: Diagnosis not present

## 2016-04-16 DIAGNOSIS — M479 Spondylosis, unspecified: Secondary | ICD-10-CM | POA: Diagnosis not present

## 2016-04-16 DIAGNOSIS — I130 Hypertensive heart and chronic kidney disease with heart failure and stage 1 through stage 4 chronic kidney disease, or unspecified chronic kidney disease: Secondary | ICD-10-CM | POA: Diagnosis not present

## 2016-04-16 DIAGNOSIS — E785 Hyperlipidemia, unspecified: Secondary | ICD-10-CM | POA: Diagnosis not present

## 2016-04-16 DIAGNOSIS — Q2730 Arteriovenous malformation, site unspecified: Secondary | ICD-10-CM | POA: Diagnosis not present

## 2016-04-16 DIAGNOSIS — D500 Iron deficiency anemia secondary to blood loss (chronic): Secondary | ICD-10-CM

## 2016-04-16 DIAGNOSIS — Q2733 Arteriovenous malformation of digestive system vessel: Secondary | ICD-10-CM | POA: Diagnosis not present

## 2016-04-16 DIAGNOSIS — E1142 Type 2 diabetes mellitus with diabetic polyneuropathy: Secondary | ICD-10-CM | POA: Diagnosis not present

## 2016-04-16 DIAGNOSIS — K2970 Gastritis, unspecified, without bleeding: Secondary | ICD-10-CM | POA: Diagnosis not present

## 2016-04-16 DIAGNOSIS — K429 Umbilical hernia without obstruction or gangrene: Secondary | ICD-10-CM | POA: Diagnosis not present

## 2016-04-16 DIAGNOSIS — E1122 Type 2 diabetes mellitus with diabetic chronic kidney disease: Secondary | ICD-10-CM | POA: Diagnosis not present

## 2016-04-16 DIAGNOSIS — K219 Gastro-esophageal reflux disease without esophagitis: Secondary | ICD-10-CM | POA: Diagnosis not present

## 2016-04-16 DIAGNOSIS — L8991 Pressure ulcer of unspecified site, stage 1: Secondary | ICD-10-CM | POA: Diagnosis not present

## 2016-04-16 LAB — COMPREHENSIVE METABOLIC PANEL
ALBUMIN: 3 g/dL — AB (ref 3.5–5.0)
ALT: 10 U/L — ABNORMAL LOW (ref 17–63)
AST: 15 U/L (ref 15–41)
Alkaline Phosphatase: 94 U/L (ref 38–126)
Anion gap: 6 (ref 5–15)
BILIRUBIN TOTAL: 0.7 mg/dL (ref 0.3–1.2)
BUN: 59 mg/dL — AB (ref 6–20)
CHLORIDE: 102 mmol/L (ref 101–111)
CO2: 25 mmol/L (ref 22–32)
Calcium: 8.4 mg/dL — ABNORMAL LOW (ref 8.9–10.3)
Creatinine, Ser: 3.99 mg/dL — ABNORMAL HIGH (ref 0.61–1.24)
GFR calc Af Amer: 17 mL/min — ABNORMAL LOW (ref >60)
GFR calc non Af Amer: 14 mL/min — ABNORMAL LOW (ref >60)
GLUCOSE: 107 mg/dL — AB (ref 65–99)
POTASSIUM: 5.1 mmol/L (ref 3.5–5.1)
Sodium: 133 mmol/L — ABNORMAL LOW (ref 135–145)
TOTAL PROTEIN: 5.9 g/dL — AB (ref 6.5–8.1)

## 2016-04-16 LAB — CBC WITH DIFFERENTIAL/PLATELET
BASOS ABS: 0 10*3/uL (ref 0.0–0.1)
BASOS PCT: 0 %
Eosinophils Absolute: 0.2 10*3/uL (ref 0.0–0.7)
Eosinophils Relative: 5 %
HEMATOCRIT: 26.2 % — AB (ref 39.0–52.0)
Hemoglobin: 8.5 g/dL — ABNORMAL LOW (ref 13.0–17.0)
Lymphocytes Relative: 22 %
Lymphs Abs: 0.7 10*3/uL (ref 0.7–4.0)
MCH: 30.6 pg (ref 26.0–34.0)
MCHC: 32.4 g/dL (ref 30.0–36.0)
MCV: 94.2 fL (ref 78.0–100.0)
MONO ABS: 0.3 10*3/uL (ref 0.1–1.0)
Monocytes Relative: 10 %
NEUTROS ABS: 2 10*3/uL (ref 1.7–7.7)
NEUTROS PCT: 63 %
Platelets: 138 10*3/uL — ABNORMAL LOW (ref 150–400)
RBC: 2.78 MIL/uL — AB (ref 4.22–5.81)
RDW: 15.8 % — AB (ref 11.5–15.5)
WBC: 3.2 10*3/uL — AB (ref 4.0–10.5)

## 2016-04-16 LAB — IRON AND TIBC
Iron: 31 ug/dL — ABNORMAL LOW (ref 45–182)
Saturation Ratios: 17 % — ABNORMAL LOW (ref 17.9–39.5)
TIBC: 182 ug/dL — ABNORMAL LOW (ref 250–450)
UIBC: 151 ug/dL

## 2016-04-16 LAB — FERRITIN: FERRITIN: 452 ng/mL — AB (ref 24–336)

## 2016-04-16 NOTE — Assessment & Plan Note (Addendum)
Extensive stage small cell lung cancer status post VATS with endobronchial ultrasound and mediastinal endoscopy by Dr. Roxan Hockey on 10/13/2015 resulting in aforementioned diagnosis. Carboplatin/Etoposide x 6 cycles (10/16/2015- 01/30/2016).  Post-treatment imaging demonstrates progression of disease.  Oncology history updated.  Patients with extensive stage disease, the median survival is 8 to 13 months, and the five-year survival rate is 1 to 2 percent.  In most series, less than 5 percent of those with Extensive Stage SCLC survive beyond two years.  Prophylactic cranial irradiation decreases the incidence of symptomatic brain metastases in patients who have responded to systemic chemotherapy, although its impact on overall survival is uncertain. For patients with a response to systemic chemotherapy, thoracic radiation may be of benefit in increasing the percentage of long-term survivors.   DNR   Labs today: CBC diff, CMET, iron/TIBC, ferritin.  I personally reviewed and went over laboratory results with the patient.  The results are noted within this dictation.  Nephrology note reviewed from 03/23/2016.  Change in antihypertensives noted.  Diovan is discontinued and Hydralazine is started.  It is noted that the patient's renal function is declining and according to his 03/23/2016 nephrology visit note, the patient is willing to do dialysis if needed.  I would discourage this given his terminal prognosis from small cell lung cancer.  He is scheduled for a 4 month follow-up appointment with nephrology.  On previous encounter, imaging was reviewed with the patient.  Further treatment options were discussed: Topotecan vs Taxol vs Hospice.  He is here today to further discuss options and help with decision making.  He has decided not to pursue treatment.  HOWEVER, he is not interested in Hospice.  We had a discussion about his wishes.  He wants to continue to with routine blood work to monitor his blood  counts and iron studies.  His wife reports that they will be more interested in Hospice when he becomes more debilitated.  He remains in denial regarding his prognosis and continues to decline Hospice intervention.  Labs every 2 weeks CBC diff, ferritin.  Labs in 8 weeks: CBC diff, CMET, iron/TIBC, ferritin.  Return in 8 weeks for follow-up.

## 2016-04-16 NOTE — Assessment & Plan Note (Signed)
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   Labs today: CBC diff, CMET, iron/TIBC, ferritin.  Given his dark stools, I will give 6 stool cards to evaluate for blood.  Again, given his prognosis, I have attempted to discourage the patient from pursuing a colonoscopy as we can support his iron needs with replacement therapy and the risks of colonoscopy greatly outweigh the benefit.  Labs every 2 weeks: CBC diff, ferritin.  Labs in 8 weeks: CBC diff, CMET, iron/TIBC, ferritin.

## 2016-04-16 NOTE — Patient Instructions (Addendum)
[place] at [facility] Discharge Instructions  RECOMMENDATIONS MADE BY THE CONSULTANT AND ANY TEST RESULTS WILL BE SENT TO YOUR REFERRING PHYSICIAN.  You were seen by Gershon Mussel today Return to clinic in 8 weeks with labs Labs in 2 weeks 6 stool cards  Thank you for choosing [place] at [facility] to provide your oncology and hematology care.  To afford each patient quality time with our provider, please arrive at least 15 minutes before your scheduled appointment time.   Beginning January 23rd 2017 lab work for the [facility] will be done in the  Main lab at [facility] on 1st floor. If you have a lab appointment with the [facility] please come in thru the  Main Entrance and check in at the main information desk  You need to re-schedule your appointment should you arrive 10 or more minutes late.  We strive to give you quality time with our providers, and arriving late affects you and other patients whose appointments are after yours.  Also, if you no show three or more times for appointments you may be dismissed from the clinic at the providers discretion.     Again, thank you for choosing [facility].  Our hope is that these requests will decrease the amount of time that you wait before being seen by our physicians.       _____________________________________________________________  Should you have questions after your visit to [facility], please contact our office at [phone] between the hours of 8:30 a.m. and 4:30 p.m.  Voicemails left after 4:30 p.m. will not be returned until the following business day.  For prescription refill requests, have your pharmacy contact our office.         Resources For Cancer Patients and their Caregivers ? American Cancer Society: Can assist with transportation, wigs, general needs, runs Look Good Feel Better.        548-758-7170 ? Cancer Care: Provides financial  assistance, online support groups, medication/co-pay assistance.  1-800-813-HOPE 306-242-3748) ? [place] Assists [place] cancer patients and their families through emotional , educational and financial support.  3026140895 ? [place] DSS Where to apply for food stamps, Medicaid and utility assistance. (858)237-8153 ? [facility]: Transportation to medical appointments. 970-492-8212 ? Social Security Administration: May apply for disability if have a Stage IV cancer. 570-483-0120 904-733-9292 ? [employer], Disability and Transit Services: Assists with nutrition, care and transit needs. [place] Support Programs: '@10RELATIVEDAYS'$ @ > Cancer Support Group  2nd Tuesday of the month 1pm-2pm, Journey Room  > Creative Journey  3rd Tuesday of the month 1130am-1pm, Journey Room  > Look Good Feel Better  1st Wednesday of the month 10am-12 noon, Journey Room (Call [place] to register [phone])

## 2016-04-16 NOTE — Progress Notes (Addendum)
[name], [name] [place] [id number]  Small cell carcinoma of lung, unspecified laterality [facility]) - Plan: CBC with Differential, Comprehensive metabolic panel  Iron deficiency anemia due to chronic blood loss - Plan: Occult blood card to lab, stool, Occult blood card to lab, stool, Occult blood card to lab, stool, Occult blood card to lab, stool, Occult blood card to lab, stool, Occult blood card to lab, stool, CBC with Differential, Ferritin, CBC with Differential, Iron and TIBC, Ferritin  CURRENT THERAPY: Discussion regarding treatment options for SCLC and IV iron replacement therapy when needed.  INTERVAL HISTORY: [name] 65 y.o. male returns for followup of Extensive stage small cell lung cancer (based upon bilateral pulmonary involvement radiographically without biopsy of left lung nodules) beginning Carboplatin/Etoposide x 6 cycles (10/16/2015- 01/30/2016).  Post-treatment imaging demonstrates progression of disease. 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     Small cell lung cancer ([place])   09/26/2015 Initial Diagnosis    Small cell lung cancer ([place])      09/26/2015 Imaging    CT CAP- Extensive malignancy in the thorax. Large right hilar/mediastinal masslike soft tissue density causing encasement and narrowing of the right mainstem bronchus and bronchus intermedius. Discrete mass difficult separate from the extensive multifo..      09/26/2015 - 09/30/2015 Hospital Admission    Epigastric pain      09/29/2015 Procedure    Bronchoscopy by Dr. Luan Pulling      09/29/2015 Pathology Results    Bronchial brushing and washing- BENIGN REACTIVE/REPARATIVE CHANGES.      10/13/2015 Procedure    VIDEO BRONCHOSCOPY WITH ENDOBRONCHIAL ULTRASOUND and MEDIASTINOSCOPY by Dr. Roxan Hockey      10/14/2015 Pathology Results    1. Lymph node, biopsy, 2R - METASTATIC SMALL CELL CARCINOMA. 2. Lymph node, biopsy, cervical -  ONE BENIGN LYMPH NODE. 3. Lymph node, biopsy, 2R #2 - METASTATIC SMALL CELL CARCINOMA.      10/16/2015 - 01/30/2016 Chemotherapy    Carboplatin/Etoposide (dose reduced based upon renal function) x 6 cycles      10/16/2015 Code Status    DNR      10/16/2015 Imaging    [place] L upper extremity- No evidence of deep venous thrombosis.      11/05/2015 Imaging    [place] of L LE- No evidence of deep venous thrombosis.      12/15/2015 Imaging    CT CAP- Significant interval decrease in confluent soft tissue within the right peritracheal and right hilar region. Interval decrease in size of two spiculated left upper lobe pulmonary nodules. Small pericardial effusion.      02/27/2016 Imaging    CT CAP- Interval progression of right suprahilar disease, now obliterating the airway to the apical segment right upper lobe. This is associated with interval development of new right upper lobe pulmonary nodules, suspicious for metastatic disease.      02/27/2016 Progression    CT scan demonstrates progression of disease.      He denies any complaints today.  He has decided against second line therapy with is reasonable given the less than response rate with resistant/refractory disease.  He remains in denial about the gravity and prognosis of his disease.  He refuses help at home with Hospice.  He reports that he is "going to live my life, like God promises."  He reports progressive fatigue.  He denies denies any tinnitus which he usually notes when he  needs transfusion/iron infusion.    He asks about a colonoscopy given his dark stools.  His last one was in 2013.  Given his prognosis, I do not think a colonoscopy is reasonable.    Review of Systems  Constitutional: Negative.  Negative for chills and fever.  HENT: Negative.   Eyes: Negative for blurred vision and double vision.  Respiratory: Negative.  Negative for cough, hemoptysis and shortness of breath.   Cardiovascular: Negative.  Negative for chest  pain.  Gastrointestinal: Positive for melena. Negative for blood in stool, constipation, diarrhea, nausea and vomiting.  Genitourinary: Negative.   Musculoskeletal: Negative.   Skin: Negative.   Neurological: Negative.  Negative for headaches.  Endo/Heme/Allergies: Negative.  Does not bruise/bleed easily.  Psychiatric/Behavioral: Negative.      Past Medical History:  Diagnosis Date  . Anemia    8/3,4,5- blood transfusion- [facility], colonoscopy- done & found 2 areas of bleeding   . Antineoplastic chemotherapy induced anemia 01/07/2016  . Anxiety   . Arthritis    back  . AVM (arteriovenous malformation) of colon with hemorrhage   . Blood transfusion   . Carotid artery occlusion   . CHF (congestive heart failure) ([place])    3 yrs ago  . Chronic kidney disease    renal failure /w osteomyelitis- 2010  . Chronic renal disease, stage 5, glomerular filtration rate less than or equal to 15 mL/min/1.73 square meter (HCC) 10/07/2010  . Difficulty swallowing solids   . Diverticulosis 03/04/10  . DNR (do not resuscitate) 10/16/2015   10/16/2015  . Gastritis   . GERD (gastroesophageal reflux disease)   . HLD (hyperlipidemia)   . HTN (hypertension)   . Internal hemorrhoids   . Iron deficiency anemia due to chronic blood loss 10/07/2010  . Irregular heart beat   . Lung mass 09/26/2015  . Other symptoms involving cardiovascular system   . Peripheral vascular disease, unspecified   . Pneumonia April 2017  . Right carotid bruit   . Sacral pressure sore 11/04/2015  . Shortness of breath    noted /w low Hgb  . Tobacco use disorder   . Type II or unspecified type diabetes mellitus without mention of complication, not stated as uncontrolled   . Ulcer ([place])   . Umbilical hernia now   has not been repaired  . Undiagnosed cardiac murmurs     Past Surgical History:  Procedure Laterality Date  . BELOW KNEE LEG AMPUTATION     2010, wears prosthesis   . bilateral common superficial femoral and profudus  artery endarterectomies      2003   Dr. Sherren Mocha Early  . BRONCHIAL BRUSHINGS  09/29/2015   Procedure: BRONCHIAL BRUSHINGS;  Surgeon: Sinda Du, MD;  [facility];  Service: Cardiopulmonary;;  Tracheal and left lung  . BRONCHIAL WASHINGS  09/29/2015   Procedure: BRONCHIAL WASHINGS;  Surgeon: Sinda Du, MD;  [facility];  Service: Cardiopulmonary;;  Bilatersl washings  . CAROTID ENDARTERECTOMY Left 02-09-12  . COLONOSCOPY  03/04/2010   Dr. Princella Pellegrini AMV without mention of ablation, scattered diverticula, internal hemorrhoids  . COLONOSCOPY  11/2004   Dr. Barkley Bruns  . COLONOSCOPY  01/22/2012   NUR: Two cecal AV malformations without stigmata of bleed with maximal.meter of 8-10 mm. Both of these are ablated with argon plasma coagulator./ Small external hemorrhoids  . ENDARTERECTOMY  02/09/2012   Procedure: ENDARTERECTOMY CAROTID;  Surgeon: Rosetta Posner, MD;  Location: [place];  Service: Vascular;  Laterality: Left;  . ENTEROSCOPY  01/21/2012   Procedure: ENTEROSCOPY;  Surgeon: Danie Binder, MD;  [facility];  Service: Endoscopy;;  . ESOPHAGOGASTRODUODENOSCOPY  03/2000   Dr. Tharon Aquas gastritis, H.Pylori gastritis, ?treated  . ESOPHAGOGASTRODUODENOSCOPY  05/2006   Dr. Marcello Fennel recurrent GI bleed. antral gastritis, single gastric AVM s/p APC, CLO results?  . ESOPHAGOGASTRODUODENOSCOPY  01/21/2012   SLF: MILD TO MAODERATE Gastritis/ SLOW GIB LIKEY DUE TO PT BEING ON ASA ND SUBSEQUENT BLOOD/ LOSS FROM AVMS IN STOMACH AND COLON AS WELL AS GASTRITIS  . ESOPHAGOGASTRODUODENOSCOPY N/A 09/03/2013   Mild chronic gastritis. benign gastric polyps  . ESOPHAGOGASTRODUODENOSCOPY N/A 08/19/2015   Dr. Oneida Alar: 2 large gastric AVMs in fundus, 1 actively bleeding s/p APC and clip placement, empiric Savary dilation  . FLEXIBLE BRONCHOSCOPY N/A 09/29/2015   Procedure: FLEXIBLE BRONCHOSCOPY;  Surgeon: Sinda Du, MD;  [facility];  Service:  Cardiopulmonary;  Laterality: N/A;  . GIVENS CAPSULE STUDY N/A 09/03/2013   Dr. Oneida Alar: small bowel and colonic AVMs. no active bleeding  . MEDIASTINOSCOPY N/A 10/13/2015   Procedure: MEDIASTINOSCOPY;  Surgeon: Melrose Nakayama, MD;  Location: [place];  Service: Thoracic;  Laterality: N/A;  . 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 SAVORY DILATION N/A 08/19/2015   Procedure: SAVORY DILATION;  Surgeon: Danie Binder, MD;  [facility];  Service: Endoscopy;  Laterality: N/A;  . VASCULAR SURGERY     rt bka and rt 1st toe amputation  . VIDEO BRONCHOSCOPY WITH ENDOBRONCHIAL ULTRASOUND N/A 10/13/2015   Procedure: VIDEO BRONCHOSCOPY WITH ENDOBRONCHIAL ULTRASOUND;  Surgeon: Melrose Nakayama, MD;  Location: [facility];  Service: Thoracic;  Laterality: N/A;    Family History  Problem Relation Age of Onset  . Colon cancer Brother 48    deceased  . Hyperlipidemia Brother   . Hypertension Brother   . Lung cancer Sister     deceased, three primary cancers: lung, esophageal, lymphoma.   . Diabetes Sister   . Hypertension Sister   . Cancer Father     gallbladder  . Hyperlipidemia Father   . Hypertension Father   . Cancer Brother     unknown primary  . Cancer Sister     unknown primary  . Hyperlipidemia Sister   . Diabetes Mother   . Hypertension Mother     Social History   Social History  . Marital status: Married    Spouse name: N/A  . Number of children: N/A  . Years of education: N/A   Social History Main Topics  . Smoking status: Former Smoker    Packs/day: 2.00    Years: 50.00    Types: Cigarettes  . Smokeless tobacco: Former Systems developer    Quit date: 02/08/2012     Comment: Smokes about 2 packs of tiny skinny cigarettes  . Alcohol use No  . Drug use: No  . Sexual activity: Not Asked   Other Topics Concern  . None   Social History Narrative  . None     PHYSICAL EXAMINATION  ECOG PERFORMANCE STATUS: 2 - Symptomatic, <50%  confined to bed  Vitals:   04/16/16 1300  BP: (!) 145/59  Pulse: 76  Resp: 18  Temp: 98.2 F (36.8 C)     GENERAL:alert, no distress, comfortable, cooperative, smiling and chronically ill appearing, accompanied by his wife.Marland Kitchen SKIN: skin color, texture, turgor are normal, no rashes or significant lesions. HEAD: Normocephalic, No masses, lesions, tenderness or abnormalities EARS: External ears normal OROPHARYNX:lips,  buccal mucosa, and tongue normal and mucous membranes are moist  NECK: trachea midline LYMPH:  Not examined BREAST:not examined LUNGS: clear to ausculation without wheezes, rales, or rhonchi.  Decreased breath sounds bilaterally. HEART: RRR  ABDOMEN:abdomen soft and normal bowel sounds BACK: Back symmetric, no curvature. EXTREMITIES:less then 2 second capillary refill, no skin discoloration, no cyanosis, right above the knee amputation NEURO: alert & oriented x 3 with fluent speech, no focal motor/sensory deficits   LABORATORY DATA: CBC    Component Value Date/Time   WBC 3.2 (L) 04/16/2016 1329   RBC 2.78 (L) 04/16/2016 1329   HGB 8.5 (L) 04/16/2016 1329   HCT 26.2 (L) 04/16/2016 1329   HCT 31.7 (L) 03/16/2016 0909   PLT 138 (L) 04/16/2016 1329   PLT 120 (L) 03/16/2016 0909   MCV 94.2 04/16/2016 1329   MCV 92 03/16/2016 0909   MCH 30.6 04/16/2016 1329   MCHC 32.4 04/16/2016 1329   RDW 15.8 (H) 04/16/2016 1329   RDW 17.1 (H) 03/16/2016 0909   LYMPHSABS 0.7 04/16/2016 1329   LYMPHSABS 0.7 03/16/2016 0909   MONOABS 0.3 04/16/2016 1329   EOSABS 0.2 04/16/2016 1329   EOSABS 0.4 03/16/2016 0909   BASOSABS 0.0 04/16/2016 1329   BASOSABS 0.0 03/16/2016 0909      Chemistry      Component Value Date/Time   NA 133 (L) 04/16/2016 1329   NA 140 03/16/2016 0909   K 5.1 04/16/2016 1329   CL 102 04/16/2016 1329   CO2 25 04/16/2016 1329   BUN 59 (H) 04/16/2016 1329   BUN 63 (H) 03/16/2016 0909   CREATININE 3.99 (H) 04/16/2016 1329   CREATININE 1.59 (H)  12/06/2012 0852      Component Value Date/Time   CALCIUM 8.4 (L) 04/16/2016 1329   CALCIUM 8.4 (L) 09/03/2015 1230   ALKPHOS 94 04/16/2016 1329   AST 15 04/16/2016 1329   ALT 10 (L) 04/16/2016 1329   BILITOT 0.7 04/16/2016 1329   BILITOT 0.5 03/16/2016 0909      Lab Results  Component Value Date   IRON 22 (L) 04/01/2016   TIBC 186 (L) 04/01/2016   FERRITIN 406 (H) 04/01/2016     PENDING LABS:   RADIOGRAPHIC STUDIES:  No results found.   PATHOLOGY:    ASSESSMENT AND PLAN:  Small cell lung cancer ([place]) Extensive stage small cell lung cancer status post VATS with endobronchial ultrasound and mediastinal endoscopy by Dr. Roxan Hockey on 10/13/2015 resulting in aforementioned diagnosis. Carboplatin/Etoposide x 6 cycles (10/16/2015- 01/30/2016).  Post-treatment imaging demonstrates progression of disease.  Oncology history updated.  Patients with extensive stage disease, the median survival is 8 to 13 months, and the five-year survival rate is 1 to 2 percent.  In most series, less than 5 percent of those with Extensive Stage SCLC survive beyond two years.  Prophylactic cranial irradiation decreases the incidence of symptomatic brain metastases in patients who have responded to systemic chemotherapy, although its impact on overall survival is uncertain. For patients with a response to systemic chemotherapy, thoracic radiation may be of benefit in increasing the percentage of long-term survivors.   DNR   Labs today: CBC diff, CMET, iron/TIBC, ferritin.  I personally reviewed and went over laboratory results with the patient.  The results are noted within this dictation.  Nephrology note reviewed from 03/23/2016.  Change in antihypertensives noted.  Diovan is discontinued and Hydralazine is started.  It is noted that the patient's renal function is declining and according to his 03/23/2016  nephrology visit note, the patient is willing to do dialysis if needed.  I would discourage this  given his terminal prognosis from small cell lung cancer.  He is scheduled for a 4 month follow-up appointment with nephrology.  On previous encounter, imaging was reviewed with the patient.  Further treatment options were discussed: Topotecan vs Taxol vs Hospice.  He is here today to further discuss options and help with decision making.  He has decided not to pursue treatment.  HOWEVER, he is not interested in Hospice.  We had a discussion about his wishes.  He wants to continue to with routine blood work to monitor his blood counts and iron studies.  His wife reports that they will be more interested in Hospice when he becomes more debilitated.  He remains in denial regarding his prognosis and continues to decline Hospice intervention.  Labs every 2 weeks CBC diff, ferritin.  Labs in 8 weeks: CBC diff, CMET, iron/TIBC, ferritin.  Return in 8 weeks for follow-up.  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   Labs today: CBC diff, CMET, iron/TIBC, ferritin.  Given his dark stools, I will give 6 stool cards to evaluate for blood.  Again, given his prognosis, I have attempted to discourage the patient from pursuing a colonoscopy as we can support his iron needs with replacement therapy and the risks of colonoscopy greatly outweigh the benefit.  Labs every 2 weeks: CBC diff, ferritin.  Labs in 8 weeks: CBC diff, CMET, iron/TIBC, ferritin.   ORDERS PLACED FOR THIS ENCOUNTER: Orders Placed This Encounter  Procedures  . Occult blood card to lab, stool  . Occult blood card to lab, stool  . Occult blood card to lab, stool  . Occult blood card to lab, stool  . Occult blood card to lab, stool  . Occult blood card to lab, stool  . CBC with Differential  . Ferritin   . CBC with Differential  . Comprehensive metabolic panel  . Iron and TIBC  . Ferritin    MEDICATIONS PRESCRIBED THIS ENCOUNTER: No orders of the defined types were placed in this encounter.   THERAPY PLAN:  Comfort care only.  All questions were answered. The patient knows to call the clinic with any problems, questions or concerns. We can certainly see the patient much sooner if necessary.  Patient and plan discussed with Dr. Ancil Linsey and she is in agreement with the aforementioned.   This note is electronically signed by: Doy Mince 04/16/2016 5:15 PM

## 2016-04-19 ENCOUNTER — Other Ambulatory Visit (HOSPITAL_COMMUNITY): Payer: Self-pay

## 2016-04-19 ENCOUNTER — Telehealth (HOSPITAL_COMMUNITY): Payer: Self-pay | Admitting: *Deleted

## 2016-04-19 ENCOUNTER — Telehealth (HOSPITAL_COMMUNITY): Payer: Self-pay

## 2016-04-19 ENCOUNTER — Encounter (HOSPITAL_COMMUNITY): Payer: Self-pay | Admitting: Hematology & Oncology

## 2016-04-19 DIAGNOSIS — D500 Iron deficiency anemia secondary to blood loss (chronic): Secondary | ICD-10-CM

## 2016-04-19 DIAGNOSIS — D649 Anemia, unspecified: Secondary | ICD-10-CM

## 2016-04-19 NOTE — Telephone Encounter (Signed)
Called patient to tell them their time for the labs tomorrow and for Wednesday blood transfusion.Patient verbalized understanding.

## 2016-04-19 NOTE — Telephone Encounter (Signed)
Returned patient phone call, Hemoglobin 8.5. Will set patient up for 2 units of PRBC per Kirby Crigler PA-C. Will let patient know when it gets scheduled.

## 2016-04-20 ENCOUNTER — Encounter (HOSPITAL_COMMUNITY): Payer: Medicare Other

## 2016-04-20 DIAGNOSIS — E1122 Type 2 diabetes mellitus with diabetic chronic kidney disease: Secondary | ICD-10-CM | POA: Diagnosis not present

## 2016-04-20 DIAGNOSIS — K219 Gastro-esophageal reflux disease without esophagitis: Secondary | ICD-10-CM | POA: Diagnosis not present

## 2016-04-20 DIAGNOSIS — N184 Chronic kidney disease, stage 4 (severe): Secondary | ICD-10-CM | POA: Diagnosis not present

## 2016-04-20 DIAGNOSIS — D6481 Anemia due to antineoplastic chemotherapy: Secondary | ICD-10-CM | POA: Diagnosis not present

## 2016-04-20 DIAGNOSIS — I130 Hypertensive heart and chronic kidney disease with heart failure and stage 1 through stage 4 chronic kidney disease, or unspecified chronic kidney disease: Secondary | ICD-10-CM | POA: Diagnosis not present

## 2016-04-20 DIAGNOSIS — D500 Iron deficiency anemia secondary to blood loss (chronic): Secondary | ICD-10-CM

## 2016-04-20 DIAGNOSIS — D649 Anemia, unspecified: Secondary | ICD-10-CM

## 2016-04-20 DIAGNOSIS — I6529 Occlusion and stenosis of unspecified carotid artery: Secondary | ICD-10-CM | POA: Diagnosis not present

## 2016-04-20 DIAGNOSIS — I509 Heart failure, unspecified: Secondary | ICD-10-CM | POA: Diagnosis not present

## 2016-04-20 DIAGNOSIS — M479 Spondylosis, unspecified: Secondary | ICD-10-CM | POA: Diagnosis not present

## 2016-04-20 DIAGNOSIS — E785 Hyperlipidemia, unspecified: Secondary | ICD-10-CM | POA: Diagnosis not present

## 2016-04-20 DIAGNOSIS — Z720 Tobacco use: Secondary | ICD-10-CM | POA: Diagnosis not present

## 2016-04-20 DIAGNOSIS — K2970 Gastritis, unspecified, without bleeding: Secondary | ICD-10-CM | POA: Diagnosis not present

## 2016-04-20 DIAGNOSIS — Q2730 Arteriovenous malformation, site unspecified: Secondary | ICD-10-CM | POA: Diagnosis not present

## 2016-04-20 DIAGNOSIS — L8991 Pressure ulcer of unspecified site, stage 1: Secondary | ICD-10-CM | POA: Diagnosis not present

## 2016-04-20 DIAGNOSIS — E1142 Type 2 diabetes mellitus with diabetic polyneuropathy: Secondary | ICD-10-CM | POA: Diagnosis not present

## 2016-04-20 DIAGNOSIS — C3490 Malignant neoplasm of unspecified part of unspecified bronchus or lung: Secondary | ICD-10-CM | POA: Diagnosis not present

## 2016-04-20 DIAGNOSIS — R0989 Other specified symptoms and signs involving the circulatory and respiratory systems: Secondary | ICD-10-CM | POA: Diagnosis not present

## 2016-04-20 DIAGNOSIS — Z89519 Acquired absence of unspecified leg below knee: Secondary | ICD-10-CM | POA: Diagnosis not present

## 2016-04-20 DIAGNOSIS — K429 Umbilical hernia without obstruction or gangrene: Secondary | ICD-10-CM | POA: Diagnosis not present

## 2016-04-20 LAB — PREPARE RBC (CROSSMATCH)

## 2016-04-21 ENCOUNTER — Encounter (HOSPITAL_COMMUNITY): Payer: Medicare Other | Attending: Hematology & Oncology

## 2016-04-21 DIAGNOSIS — D500 Iron deficiency anemia secondary to blood loss (chronic): Secondary | ICD-10-CM | POA: Insufficient documentation

## 2016-04-21 DIAGNOSIS — D649 Anemia, unspecified: Secondary | ICD-10-CM

## 2016-04-21 MED ORDER — ACETAMINOPHEN 325 MG PO TABS
650.0000 mg | ORAL_TABLET | Freq: Once | ORAL | Status: AC
  Administered 2016-04-21: 650 mg via ORAL
  Filled 2016-04-21: qty 2

## 2016-04-21 MED ORDER — DIPHENHYDRAMINE HCL 25 MG PO CAPS
25.0000 mg | ORAL_CAPSULE | Freq: Once | ORAL | Status: AC
  Administered 2016-04-21: 25 mg via ORAL
  Filled 2016-04-21: qty 1

## 2016-04-21 MED ORDER — SODIUM CHLORIDE 0.9 % IV SOLN
250.0000 mL | Freq: Once | INTRAVENOUS | Status: AC
  Administered 2016-04-21: 250 mL via INTRAVENOUS

## 2016-04-21 NOTE — Patient Instructions (Signed)
[place] at [facility] Discharge Instructions  RECOMMENDATIONS MADE BY THE CONSULTANT AND ANY TEST RESULTS WILL BE SENT TO YOUR REFERRING PHYSICIAN.  Today you received a transfusion of 2 units packed red blood cells. Return as scheduled.  Thank you for choosing [place] at [facility] to provide your oncology and hematology care.  To afford each patient quality time with our provider, please arrive at least 15 minutes before your scheduled appointment time.   Beginning January 23rd 2017 lab work for the [facility] will be done in the  Main lab at [facility] on 1st floor. If you have a lab appointment with the [facility] please come in thru the  Main Entrance and check in at the main information desk  You need to re-schedule your appointment should you arrive 10 or more minutes late.  We strive to give you quality time with our providers, and arriving late affects you and other patients whose appointments are after yours.  Also, if you no show three or more times for appointments you may be dismissed from the clinic at the providers discretion.     Again, thank you for choosing [facility].  Our hope is that these requests will decrease the amount of time that you wait before being seen by our physicians.       _____________________________________________________________  Should you have questions after your visit to [facility], please contact our office at [phone] between the hours of 8:30 a.m. and 4:30 p.m.  Voicemails left after 4:30 p.m. will not be returned until the following business day.  For prescription refill requests, have your pharmacy contact our office.         Resources For Cancer Patients and their Caregivers ? American Cancer Society: Can assist with transportation, wigs, general needs, runs Look Good Feel Better.        9290737300 ? Cancer Care: Provides financial  assistance, online support groups, medication/co-pay assistance.  1-800-813-HOPE 6616400717) ? [place] Assists [place] cancer patients and their families through emotional , educational and financial support.  367-070-3073 ? [place] DSS Where to apply for food stamps, Medicaid and utility assistance. 9394989743 ? [facility]: Transportation to medical appointments. (669) 354-4617 ? Social Security Administration: May apply for disability if have a Stage IV cancer. 6230036405 (804) 673-7391 ? [employer], Disability and Transit Services: Assists with nutrition, care and transit needs. [place] Support Programs: '@10RELATIVEDAYS'$ @ > Cancer Support Group  2nd Tuesday of the month 1pm-2pm, Journey Room  > Creative Journey  3rd Tuesday of the month 1130am-1pm, Journey Room  > Look Good Feel Better  1st Wednesday of the month 10am-12 noon, Journey Room (Call [place] to register [phone])

## 2016-04-21 NOTE — Progress Notes (Signed)
Tolerated 1st unit PRBC transfusion without problems/issues.  Tolerated 2nd unit PRBC transfusion without problems/issues.  Blood pressure reviewed with MD. Patient took following meds at 845 this am: Norvasc '10mg'$ , Tenormin '100mg'$ , Catapres 0.'2mg'$ , Diovan '160mg'$ , and states he will take Lasix '40mg'$  when he gets home. Per MD patient is ok to be discharged home.  Stable and ambulatory on discharge home with wife.

## 2016-04-22 LAB — TYPE AND SCREEN
ABO/RH(D): O POS
ANTIBODY SCREEN: NEGATIVE
UNIT DIVISION: 0
Unit division: 0

## 2016-04-27 ENCOUNTER — Other Ambulatory Visit (HOSPITAL_COMMUNITY): Payer: Self-pay | Admitting: Oncology

## 2016-04-27 ENCOUNTER — Other Ambulatory Visit (HOSPITAL_COMMUNITY): Payer: Self-pay | Admitting: *Deleted

## 2016-04-27 DIAGNOSIS — T451X5A Adverse effect of antineoplastic and immunosuppressive drugs, initial encounter: Principal | ICD-10-CM

## 2016-04-27 DIAGNOSIS — D6481 Anemia due to antineoplastic chemotherapy: Secondary | ICD-10-CM

## 2016-04-30 ENCOUNTER — Encounter (HOSPITAL_COMMUNITY): Payer: Medicare Other

## 2016-04-30 DIAGNOSIS — D500 Iron deficiency anemia secondary to blood loss (chronic): Secondary | ICD-10-CM | POA: Diagnosis not present

## 2016-04-30 LAB — CBC WITH DIFFERENTIAL/PLATELET
BASOS ABS: 0 10*3/uL (ref 0.0–0.1)
BASOS PCT: 0 %
EOS ABS: 0.1 10*3/uL (ref 0.0–0.7)
Eosinophils Relative: 3 %
HCT: 34.2 % — ABNORMAL LOW (ref 39.0–52.0)
HEMOGLOBIN: 11 g/dL — AB (ref 13.0–17.0)
Lymphocytes Relative: 14 %
Lymphs Abs: 0.7 10*3/uL (ref 0.7–4.0)
MCH: 29.9 pg (ref 26.0–34.0)
MCHC: 32.2 g/dL (ref 30.0–36.0)
MCV: 92.9 fL (ref 78.0–100.0)
Monocytes Absolute: 0.4 10*3/uL (ref 0.1–1.0)
Monocytes Relative: 9 %
NEUTROS PCT: 74 %
Neutro Abs: 3.5 10*3/uL (ref 1.7–7.7)
Platelets: 168 10*3/uL (ref 150–400)
RBC: 3.68 MIL/uL — AB (ref 4.22–5.81)
RDW: 15.3 % (ref 11.5–15.5)
WBC: 4.7 10*3/uL (ref 4.0–10.5)

## 2016-04-30 LAB — IRON AND TIBC
IRON: 28 ug/dL — AB (ref 45–182)
SATURATION RATIOS: 17 % — AB (ref 17.9–39.5)
TIBC: 167 ug/dL — ABNORMAL LOW (ref 250–450)
UIBC: 139 ug/dL

## 2016-04-30 LAB — FERRITIN: FERRITIN: 762 ng/mL — AB (ref 24–336)

## 2016-05-03 ENCOUNTER — Other Ambulatory Visit: Payer: Self-pay | Admitting: Nurse Practitioner

## 2016-05-03 ENCOUNTER — Telehealth: Payer: Self-pay | Admitting: Gastroenterology

## 2016-05-03 NOTE — Telephone Encounter (Signed)
I don't see any issue in the chart, can you please ask for more clarification. Thanks

## 2016-05-07 DIAGNOSIS — R918 Other nonspecific abnormal finding of lung field: Secondary | ICD-10-CM | POA: Diagnosis not present

## 2016-05-07 DIAGNOSIS — I5020 Unspecified systolic (congestive) heart failure: Secondary | ICD-10-CM | POA: Diagnosis not present

## 2016-05-12 ENCOUNTER — Encounter (HOSPITAL_COMMUNITY): Payer: Medicare Other

## 2016-05-12 DIAGNOSIS — D500 Iron deficiency anemia secondary to blood loss (chronic): Secondary | ICD-10-CM | POA: Diagnosis not present

## 2016-05-12 LAB — CBC WITH DIFFERENTIAL/PLATELET
BASOS PCT: 0 %
Basophils Absolute: 0 10*3/uL (ref 0.0–0.1)
Basophils Absolute: 0 10*3/uL (ref 0.0–0.1)
Basophils Relative: 0 %
EOS ABS: 0.1 10*3/uL (ref 0.0–0.7)
Eosinophils Absolute: 0.2 10*3/uL (ref 0.0–0.7)
Eosinophils Relative: 6 %
Eosinophils Relative: 2 %
HEMATOCRIT: 23.9 % — AB (ref 39.0–52.0)
HEMATOCRIT: 31.3 % — AB (ref 39.0–52.0)
HEMOGLOBIN: 7.4 g/dL — AB (ref 13.0–17.0)
HEMOGLOBIN: 10.1 g/dL — AB (ref 13.0–17.0)
LYMPHS ABS: 0.9 10*3/uL (ref 0.7–4.0)
LYMPHS ABS: 0.6 10*3/uL — AB (ref 0.7–4.0)
LYMPHS PCT: 26 %
Lymphocytes Relative: 14 %
MCH: 23.3 pg — ABNORMAL LOW (ref 26.0–34.0)
MCH: 29.9 pg (ref 26.0–34.0)
MCHC: 31 g/dL (ref 30.0–36.0)
MCHC: 32.3 g/dL (ref 30.0–36.0)
MCV: 75.2 fL — AB (ref 78.0–100.0)
MCV: 92.6 fL (ref 78.0–100.0)
MONO ABS: 0.2 10*3/uL (ref 0.1–1.0)
MONO ABS: 0.5 10*3/uL (ref 0.1–1.0)
MONOS PCT: 7 %
MONOS PCT: 11 %
NEUTROS ABS: 2.2 10*3/uL (ref 1.7–7.7)
NEUTROS ABS: 3.3 10*3/uL (ref 1.7–7.7)
NEUTROS PCT: 61 %
Neutrophils Relative %: 73 %
Platelets: 94 10*3/uL — ABNORMAL LOW (ref 150–400)
Platelets: 165 10*3/uL (ref 150–400)
RBC: 3.38 MIL/uL — ABNORMAL LOW (ref 4.22–5.81)
RDW: 19.7 % — AB (ref 11.5–15.5)
RDW: 15.8 % — AB (ref 11.5–15.5)
WBC: 4.5 10*3/uL (ref 4.0–10.5)

## 2016-05-12 LAB — FERRITIN: Ferritin: 1252 ng/mL — ABNORMAL HIGH (ref 24–336)

## 2016-05-17 ENCOUNTER — Other Ambulatory Visit (HOSPITAL_COMMUNITY): Payer: Medicare Other

## 2016-05-28 ENCOUNTER — Encounter (HOSPITAL_COMMUNITY): Payer: Medicare Other | Attending: Hematology & Oncology

## 2016-05-28 ENCOUNTER — Telehealth (HOSPITAL_COMMUNITY): Payer: Self-pay | Admitting: *Deleted

## 2016-05-28 DIAGNOSIS — D500 Iron deficiency anemia secondary to blood loss (chronic): Secondary | ICD-10-CM | POA: Insufficient documentation

## 2016-05-28 LAB — CBC WITH DIFFERENTIAL/PLATELET
Basophils Absolute: 0 10*3/uL (ref 0.0–0.1)
Basophils Relative: 0 %
Eosinophils Absolute: 0.1 10*3/uL (ref 0.0–0.7)
Eosinophils Relative: 2 %
HEMATOCRIT: 30 % — AB (ref 39.0–52.0)
HEMOGLOBIN: 9.7 g/dL — AB (ref 13.0–17.0)
LYMPHS ABS: 0.4 10*3/uL — AB (ref 0.7–4.0)
LYMPHS PCT: 7 %
MCH: 31.1 pg (ref 26.0–34.0)
MCHC: 32.3 g/dL (ref 30.0–36.0)
MCV: 96.2 fL (ref 78.0–100.0)
MONOS PCT: 5 %
Monocytes Absolute: 0.3 10*3/uL (ref 0.1–1.0)
NEUTROS ABS: 5.6 10*3/uL (ref 1.7–7.7)
NEUTROS PCT: 86 %
Platelets: 157 10*3/uL (ref 150–400)
RBC: 3.12 MIL/uL — ABNORMAL LOW (ref 4.22–5.81)
RDW: 18.1 % — ABNORMAL HIGH (ref 11.5–15.5)
WBC: 6.5 10*3/uL (ref 4.0–10.5)

## 2016-05-28 LAB — FERRITIN: Ferritin: 1927 ng/mL — ABNORMAL HIGH (ref 24–336)

## 2016-05-28 NOTE — Telephone Encounter (Signed)
After verifying with Mrs. Akel that it was okay to speak with the prison, I called and spoke with Darryl at [facility] center. They wanted verification that Mr. Maack was as sick as his son claimed. I verifyed patients condition with PA and told Darryl patient needed to be on hospice and we would not be surprised if he passed by the end of the month. Darryl then took my name and number for his records. I called Mrs. Lince who thanked me for talking with them. She started crying saying "Bernice is covered with lumps" and "cannot do for himself."

## 2016-06-05 ENCOUNTER — Other Ambulatory Visit: Payer: Self-pay | Admitting: Nurse Practitioner

## 2016-06-11 ENCOUNTER — Other Ambulatory Visit (HOSPITAL_COMMUNITY): Payer: Medicare Other

## 2016-06-11 ENCOUNTER — Ambulatory Visit (HOSPITAL_COMMUNITY): Payer: Medicare Other | Admitting: Oncology

## 2016-06-12 NOTE — Progress Notes (Signed)
REVIEWED-NO ADDITIONAL RECOMMENDATIONS. 

## 2016-06-21 DEATH — deceased

## 2016-09-19 IMAGING — CR DG CHEST 2V
2 series · 2 of 2 positions shown · non-contrast
Comparison: CT scan of the chest September 26, 2015

CLINICAL DATA: Preoperative examination prior to left lung biopsy

EXAM:
CHEST  2 VIEW

[w chest pa]
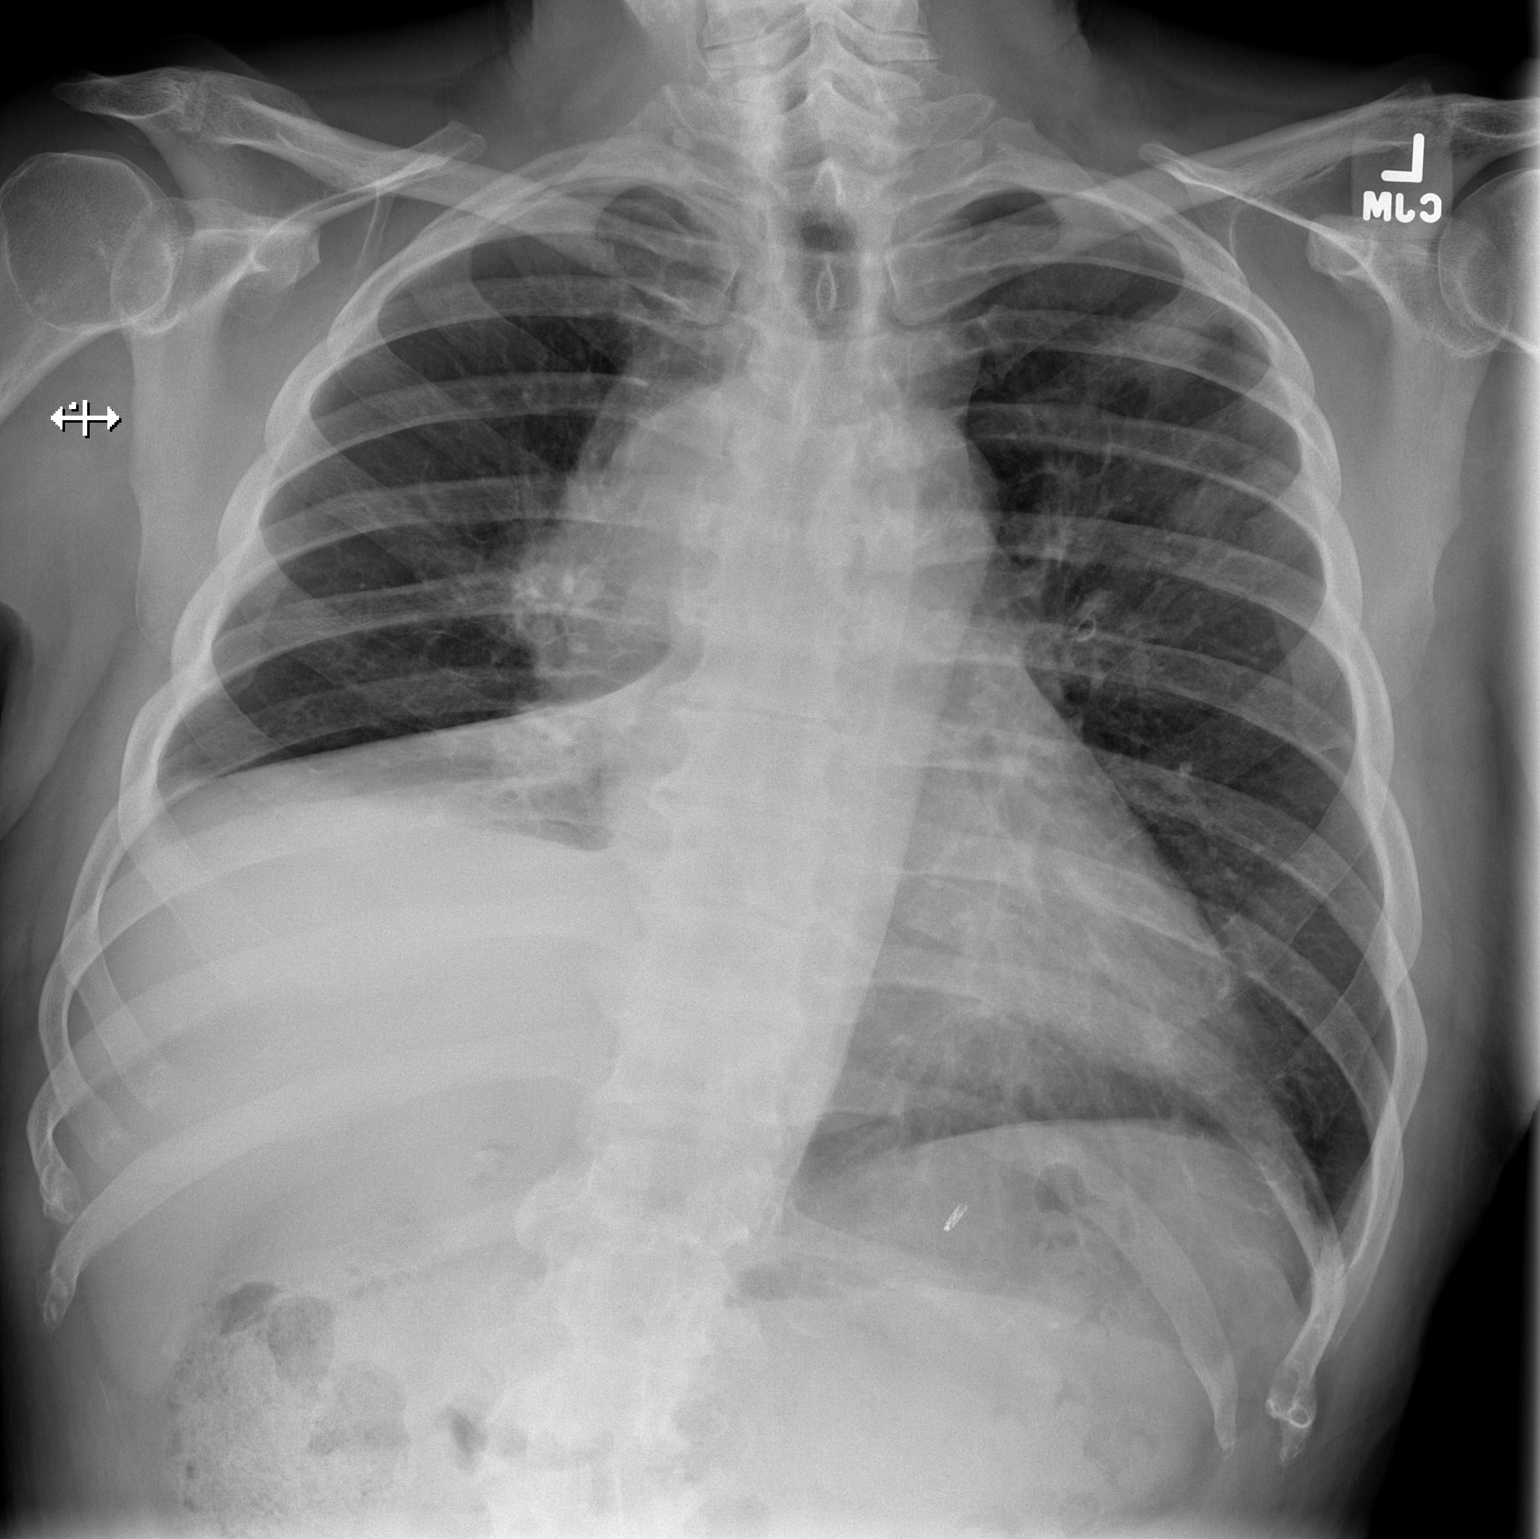

[w chest lat]
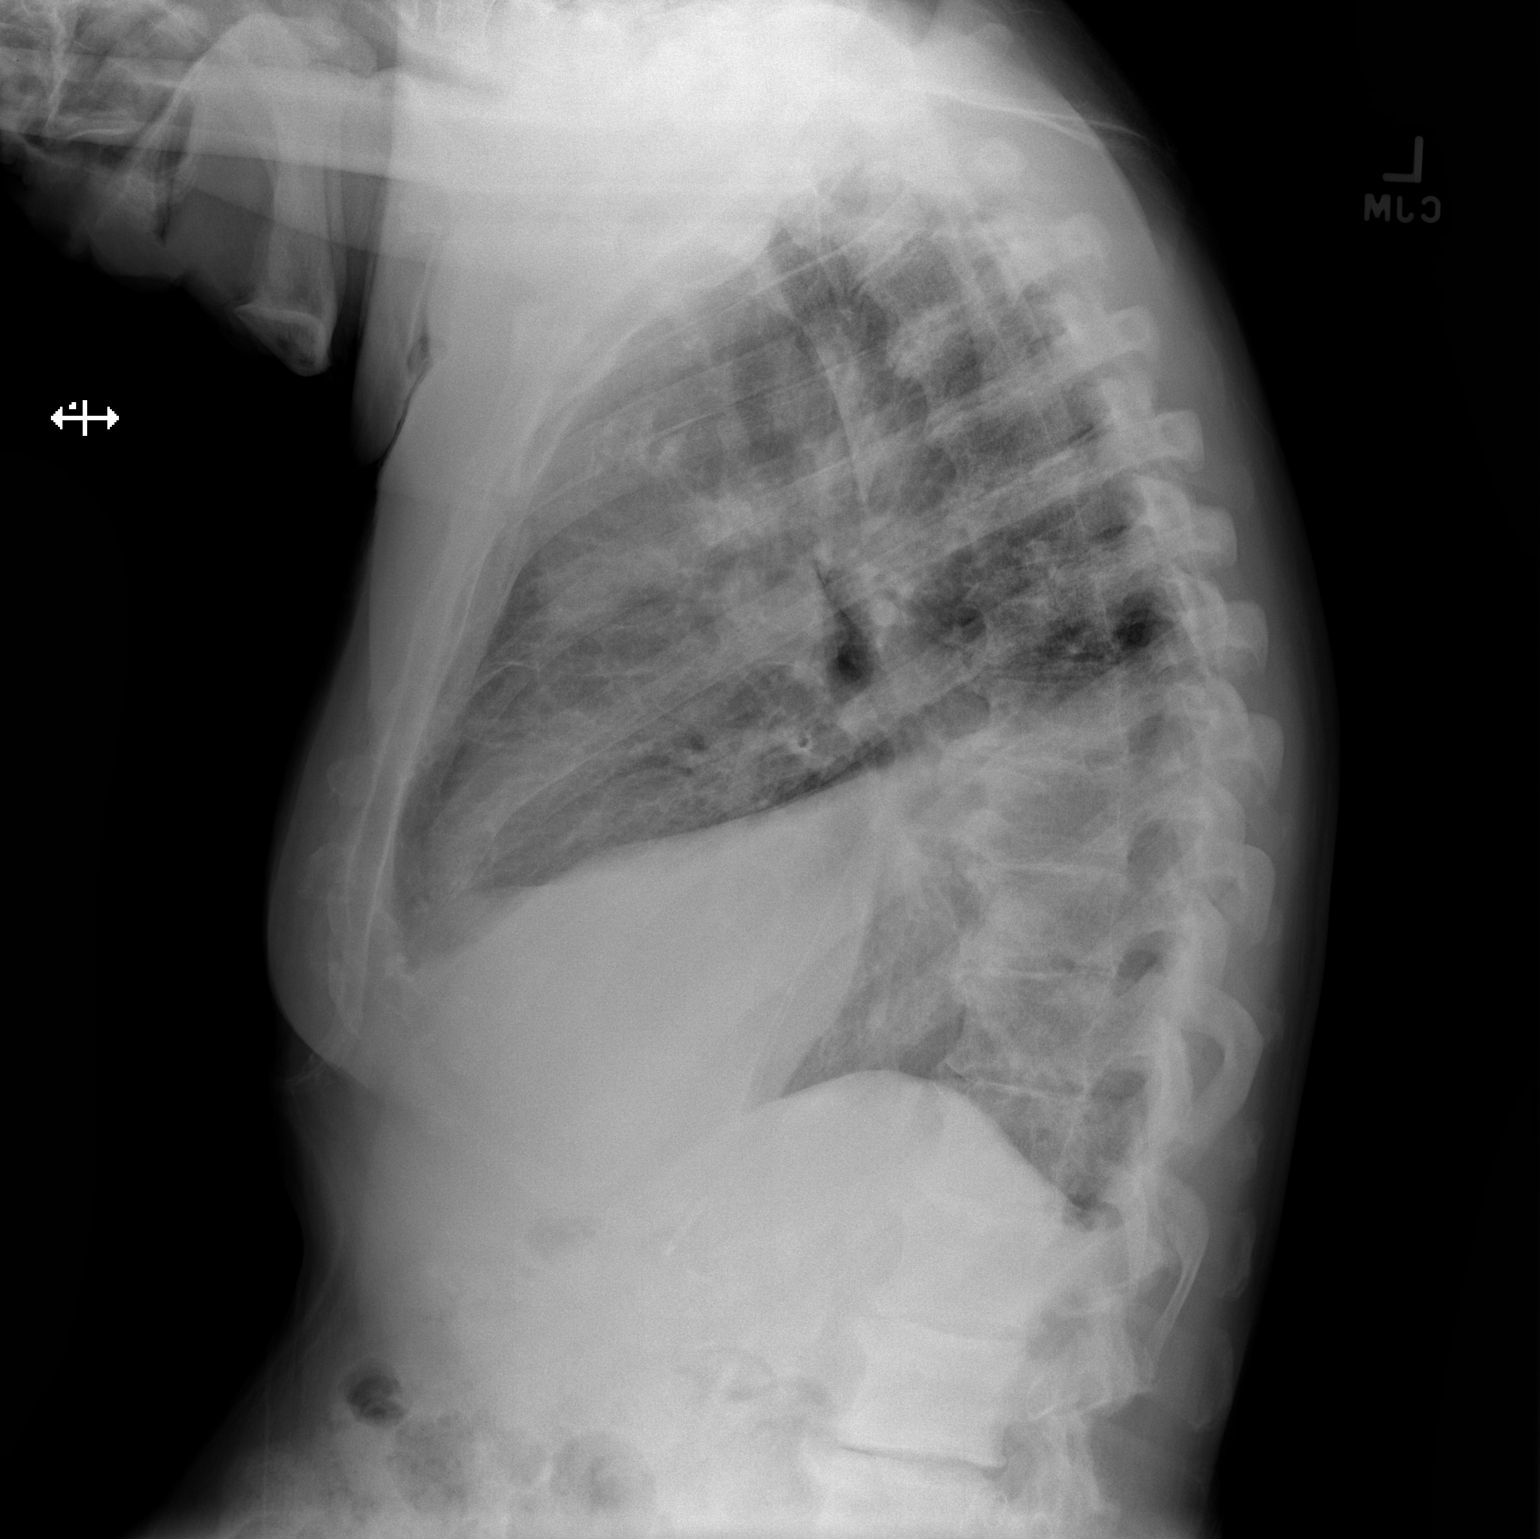

[2 of 2 positions shown; findings below may reference images not displayed]

FINDINGS: There is volume loss on the right which is not entirely new but more
conspicuous than on the previous study. This is in part due to
elevation of the right hemidiaphragm. There is right basilar
atelectasis or pneumonia. On the left the known upper lobe mass is
visible overlying the posterior aspect of the second rib. An
additional subtle masses visible inferior to this. There is no
definite pleural effusion though a small effusion on the right is
not excluded. There is soft tissue fullness in the right
paratracheal and right suprahilar region consistent with
lymphadenopathy. The heart is not enlarged. The pulmonary
vascularity is not engorged. There is mild multilevel degenerative
disc disease of the thoracic spine.
IMPRESSION: 1. Increased volume loss on the right likely due to postobstructive
atelectasis as well as elevated right hemidiaphragm.
2. Persistent bulky right paratracheal lymphadenopathy. Two upper
lobe left lung masses remain visible.
3. No evidence of CHF.

## 2017-04-18 IMAGING — US US ABDOMEN COMPLETE
1 series · 14 of 25 positions shown · non-contrast
Comparison: 03/12/2015

CLINICAL DATA: Nausea.  Mid abdominal pain for 4 weeks.

EXAM:
ABDOMEN ULTRASOUND COMPLETE

[Series 1: us abdomen complete · 0.21mm/px · 14 of 101 slices shown]
[im 1/101]
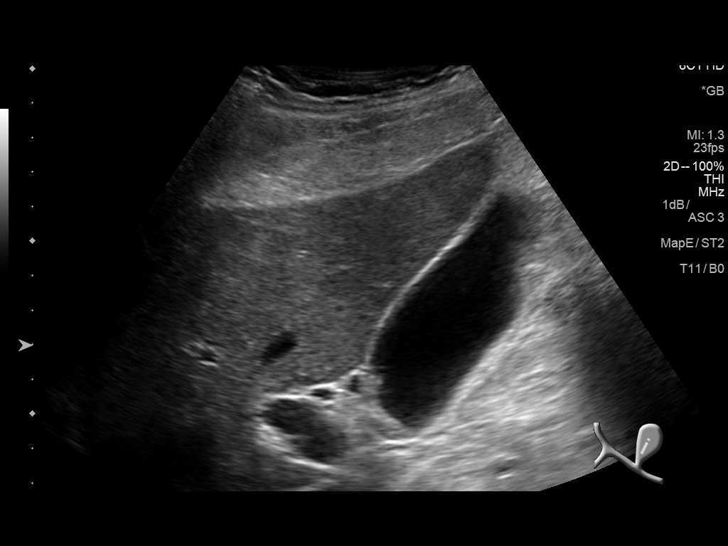
[im 9/101]
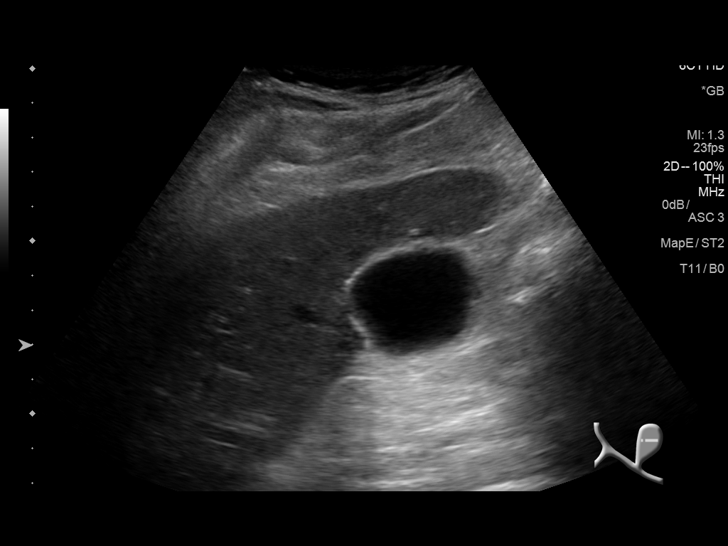
[im 17/101]
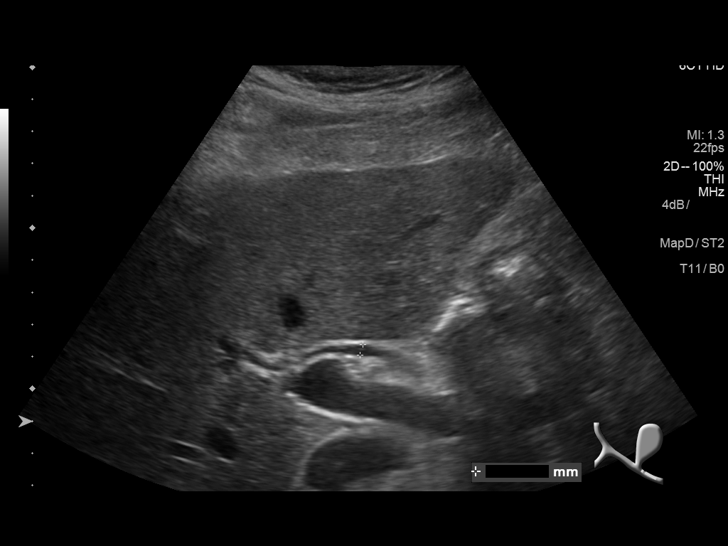
[im 26/101]
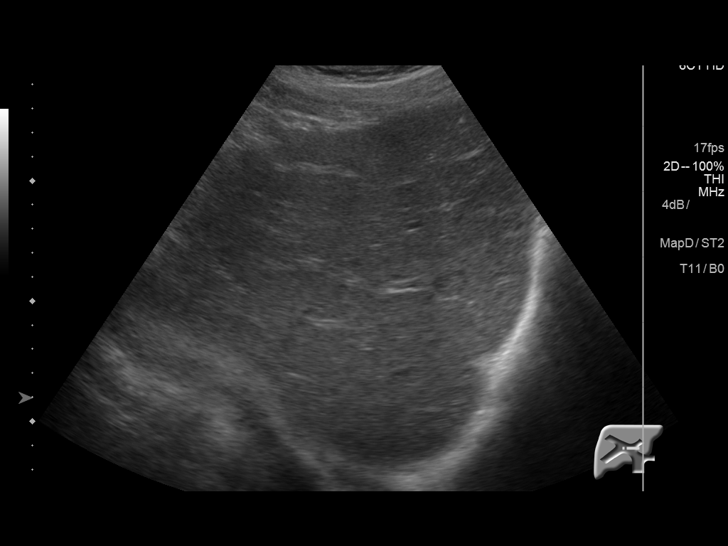
[im 34/101]
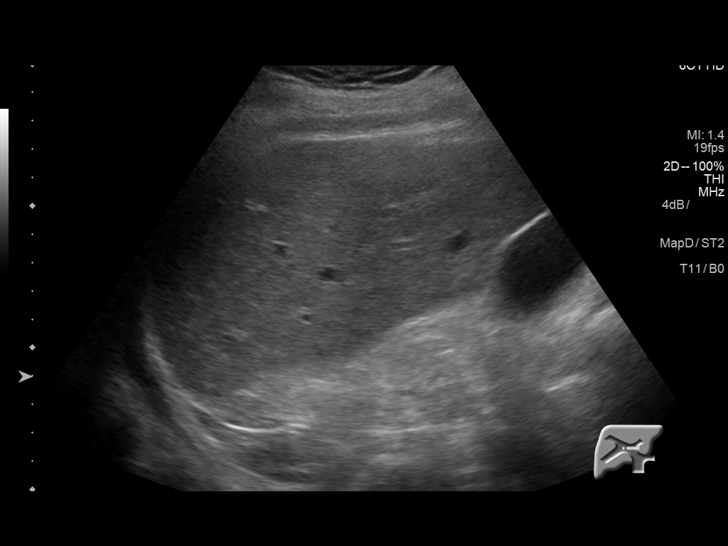
[im 38/101]
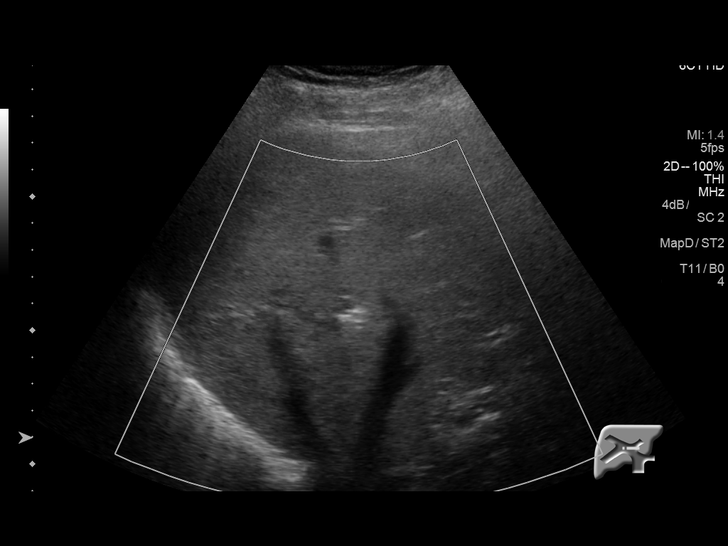
[im 46/101]
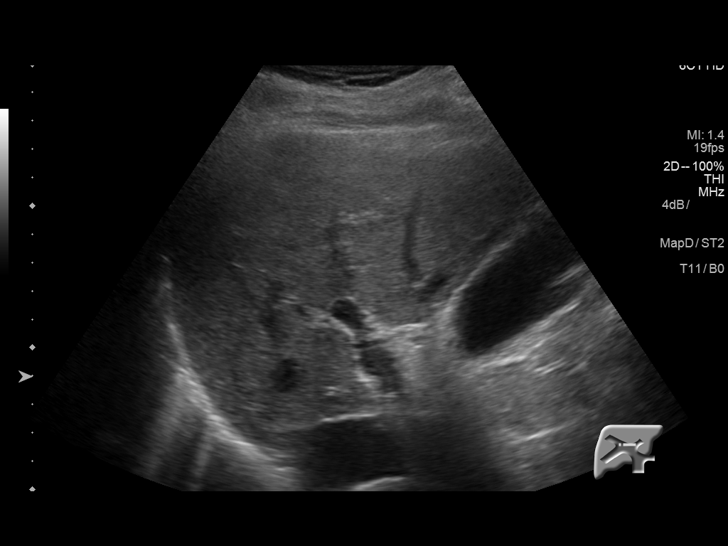
[im 55/101]
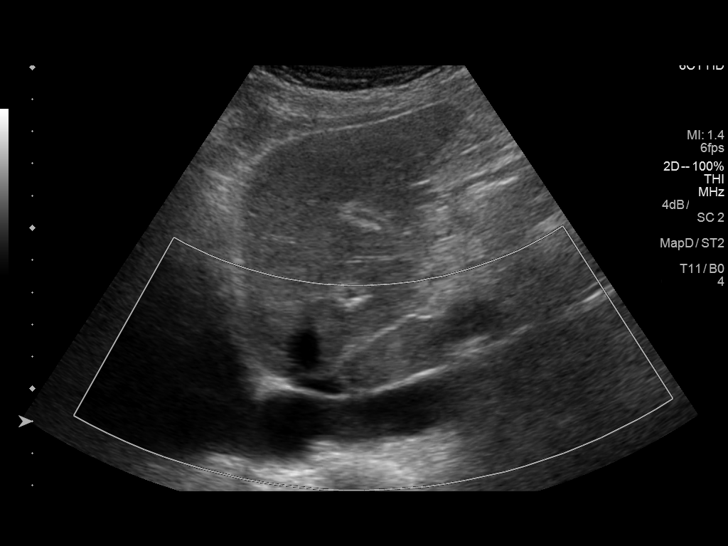
[im 63/101]
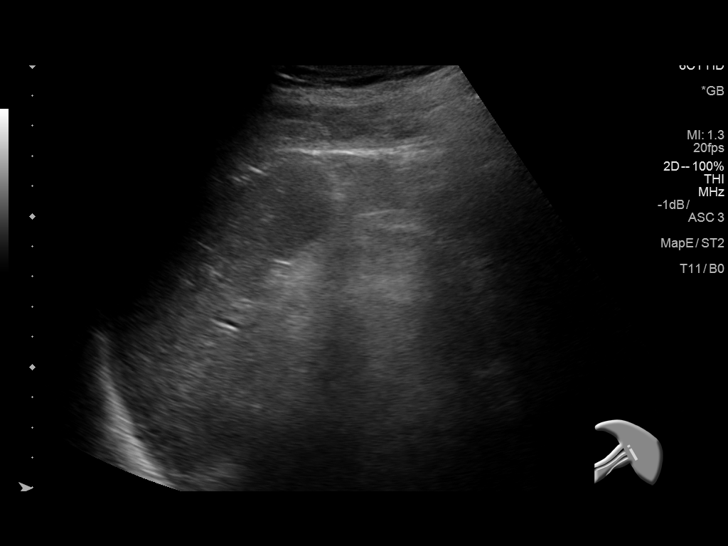
[im 67/101]
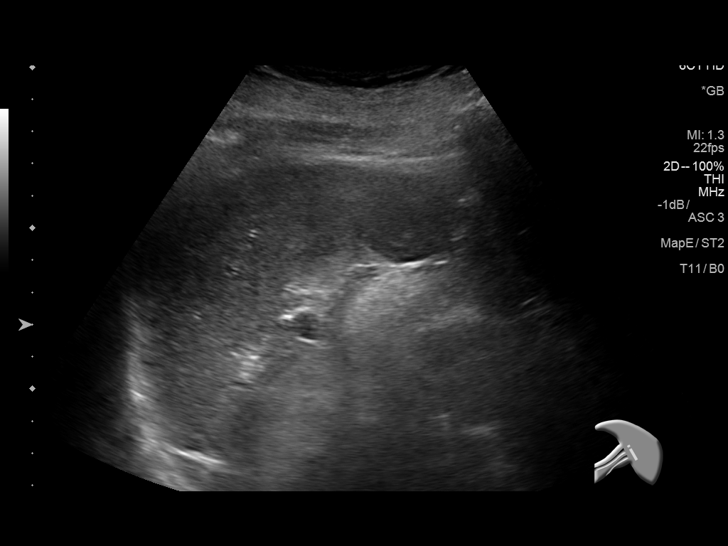
[im 76/101]
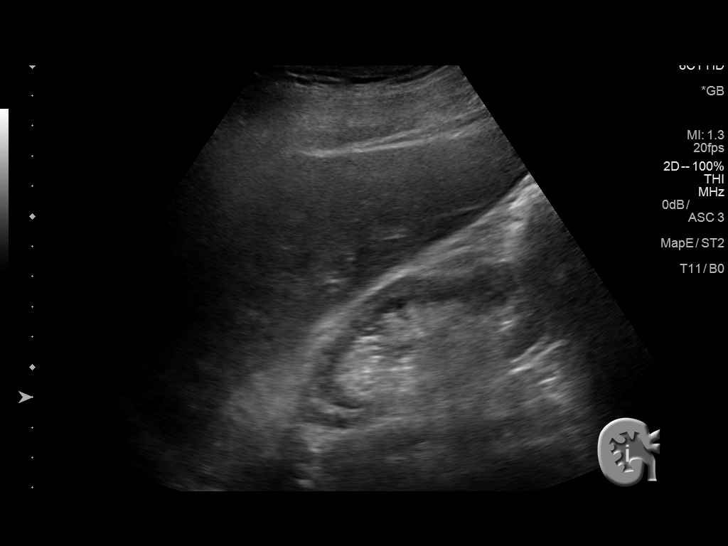
[im 84/101]
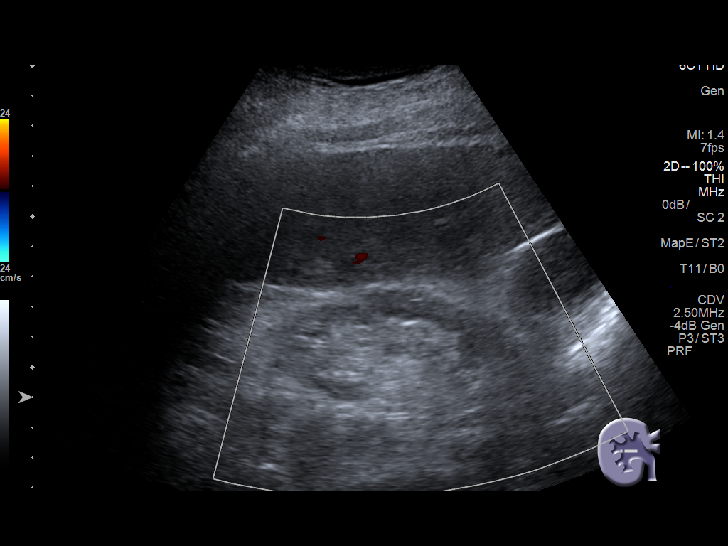
[im 92/101]
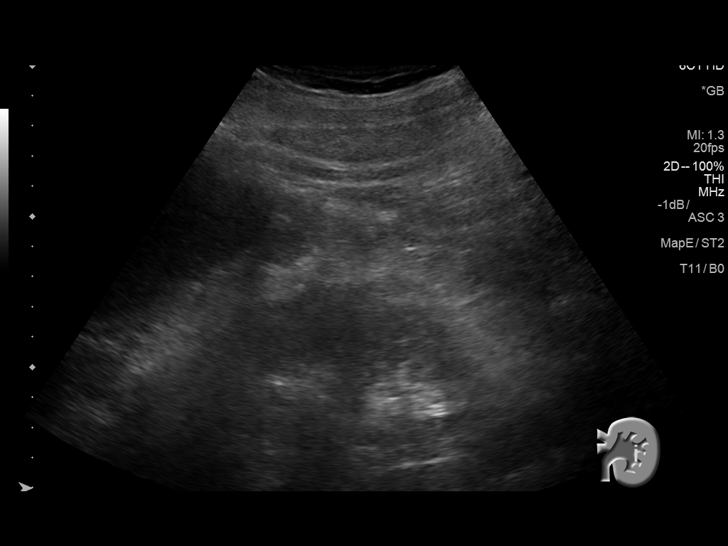
[im 101/101]
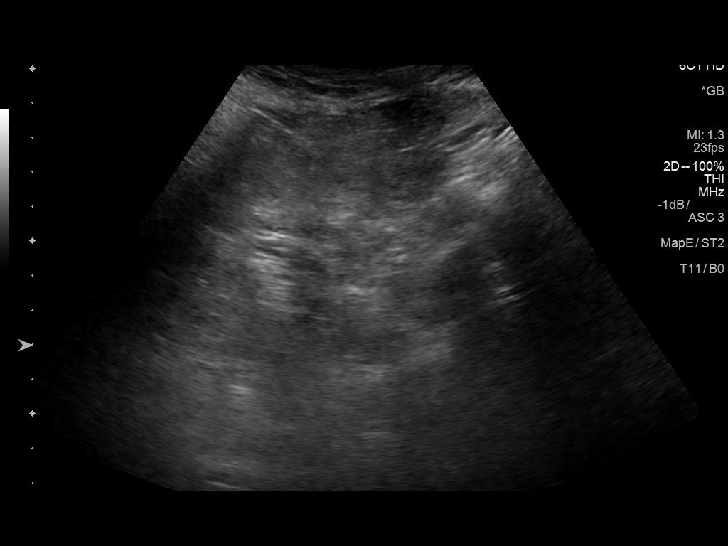

[14 of 25 positions shown; findings below may reference images not displayed]

FINDINGS: Gallbladder: No gallstones or wall thickening visualized. No
sonographic Murphy sign noted by sonographer.

Common bile duct: Diameter: Normal caliber, 3 mm

Liver: No focal lesion identified. Within normal limits in
parenchymal echogenicity.

IVC: No abnormality visualized.

Pancreas: Visualized portion unremarkable.

Spleen: Size and appearance within normal limits.

Right Kidney: Length: 9.7 cm. Mild cortical thinning. Mildly
increased echotexture. No hydronephrosis or mass.

Left Kidney: Length: 11.0 cm. Mild cortical thinning. No
hydronephrosis or mass.

Abdominal aorta: No aneurysm visualized.

Other findings: Small right pleural effusion noted.
IMPRESSION: No acute findings in the abdomen sonographically.

Mild cortical thinning in the kidneys bilaterally. No
hydronephrosis.

Small right pleural effusion.

## 2017-07-26 IMAGING — US US EXTREM  UP VENOUS*L*
1 series · 13 of 24 positions shown · non-contrast
Comparison: Prior ultrasound 09/01/2015

CLINICAL DATA: 64-year-old male with left upper extremity swelling



[Series 1: us extrem up venous*left* · 0.07mm/px · 13 of 55 slices shown]
[im 1/55]
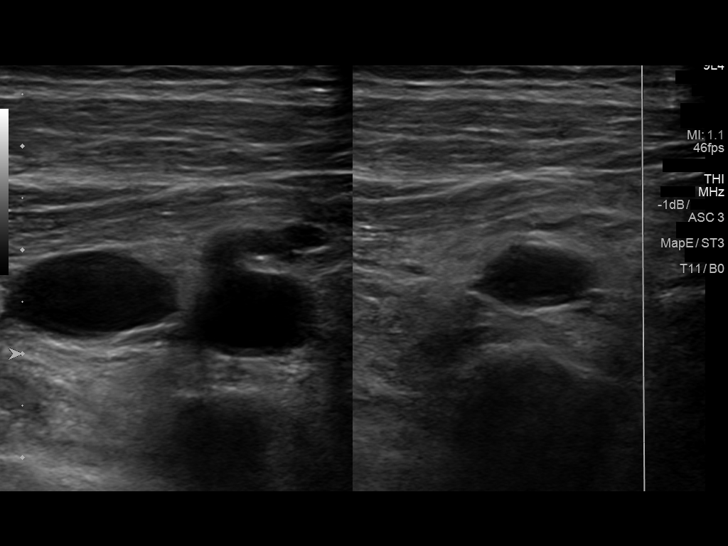
[im 5/55]
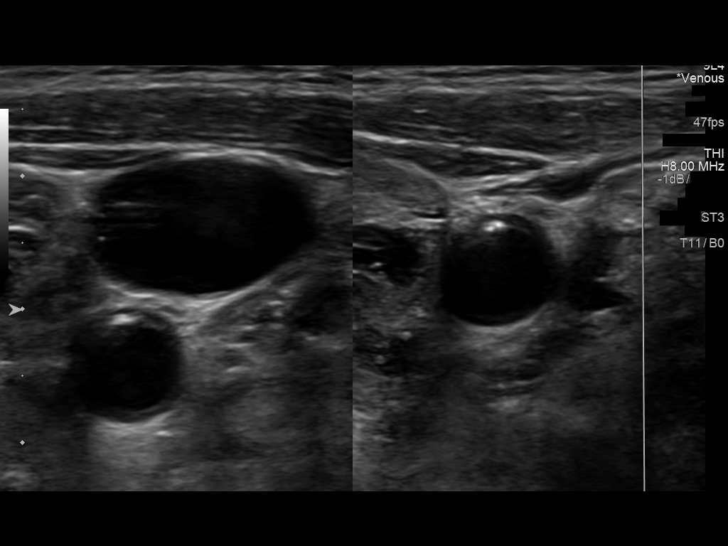
[im 10/55]
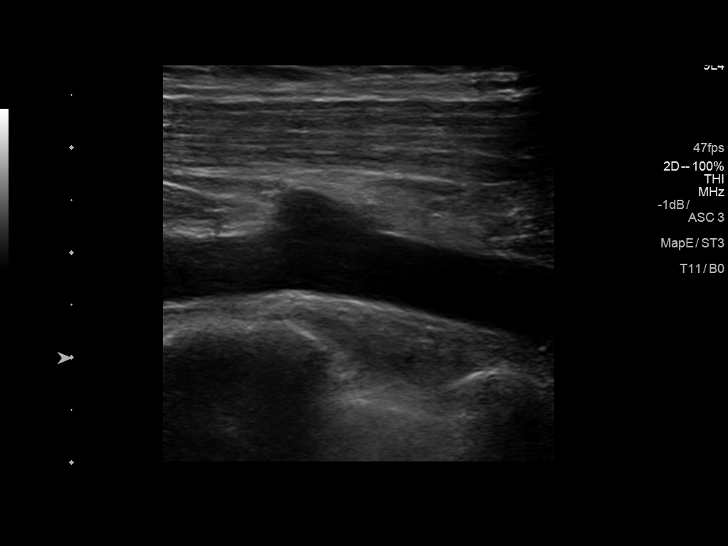
[im 15/55]
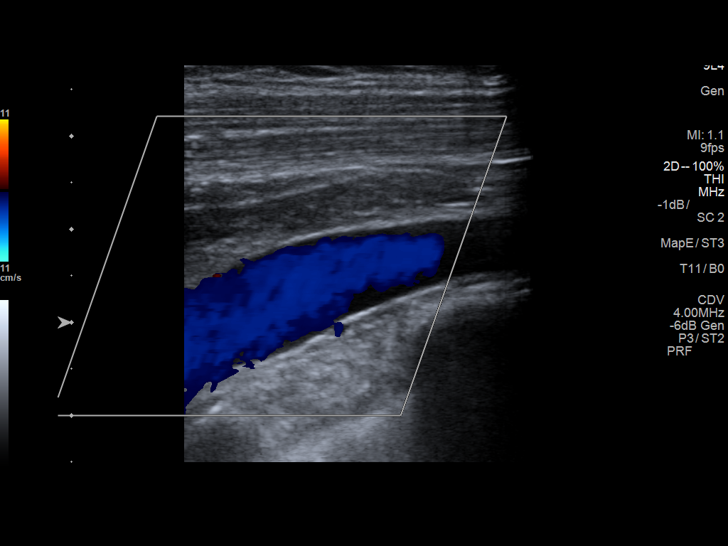
[im 19/55]
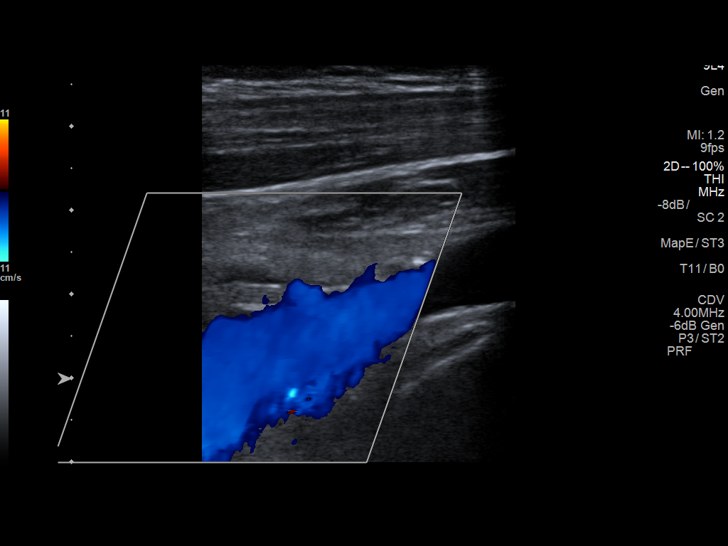
[im 24/55]
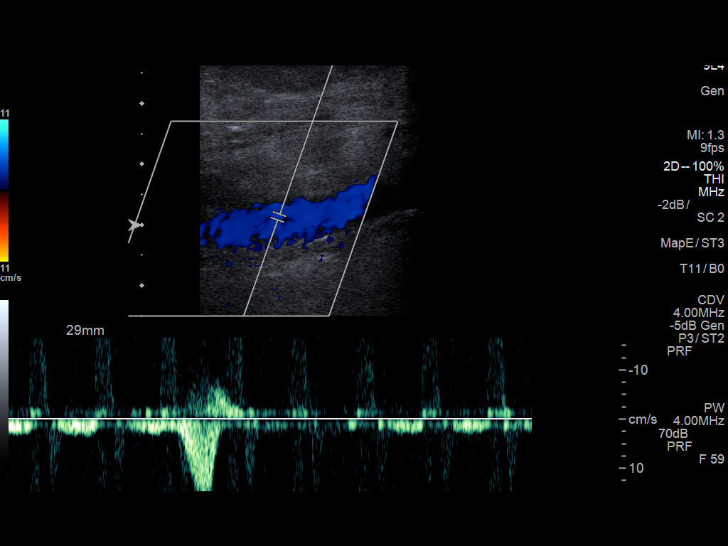
[im 29/55]
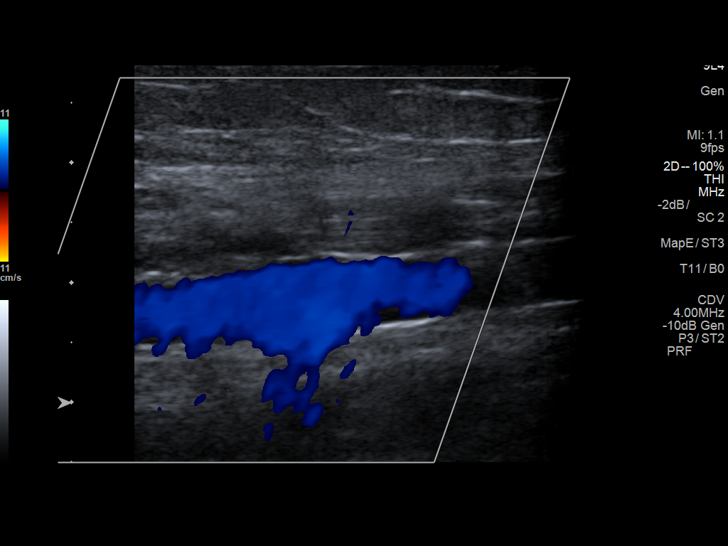
[im 31/55]
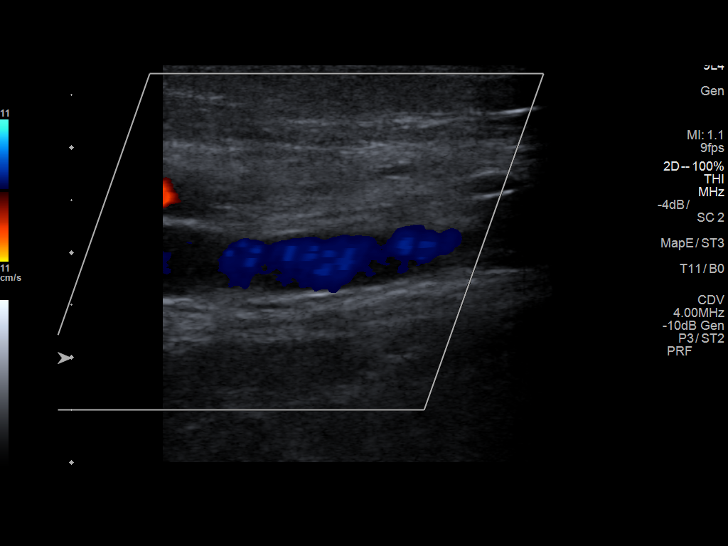
[im 36/55]
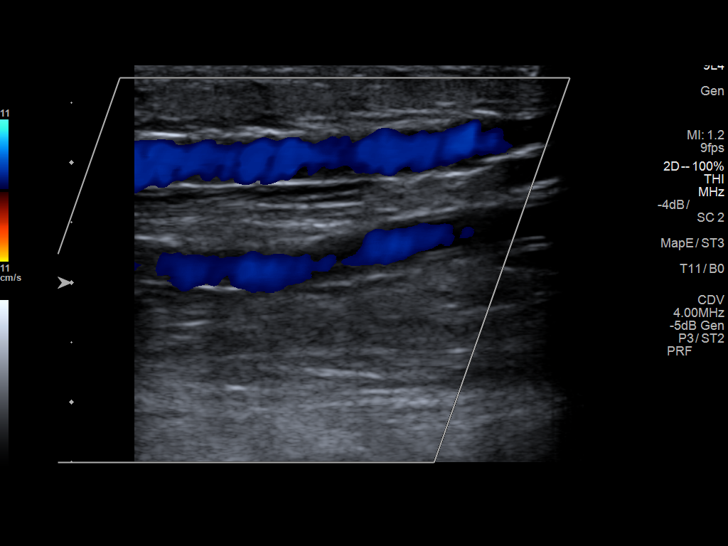
[im 40/55]
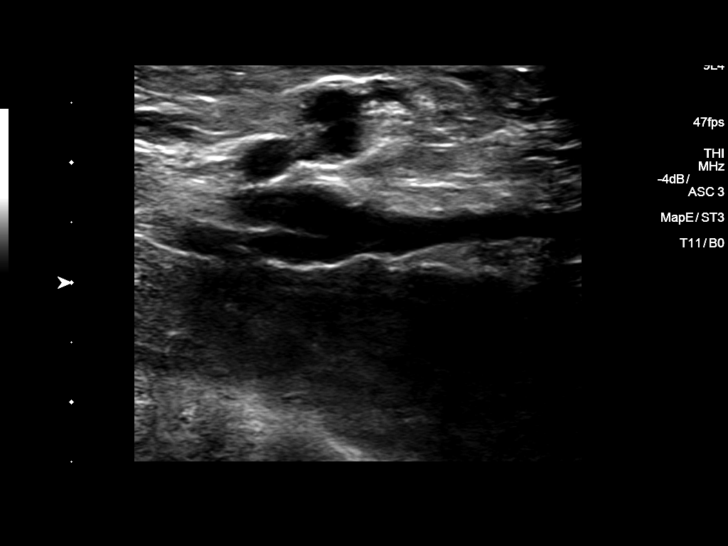
[im 45/55]
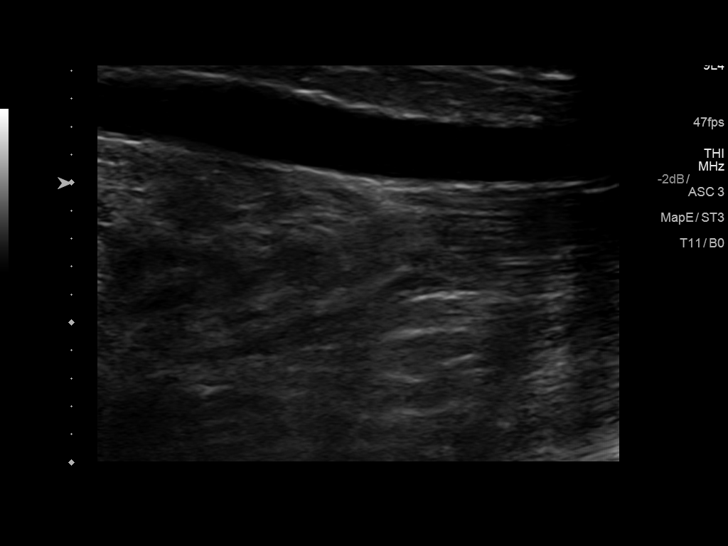
[im 50/55]
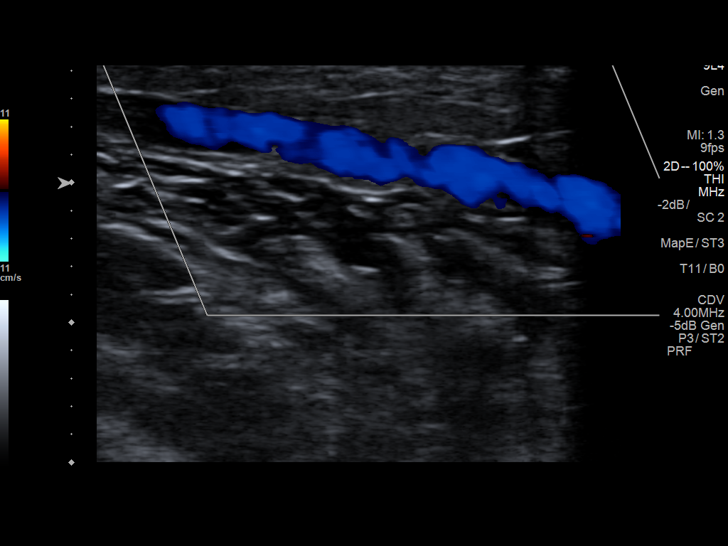
[im 55/55]
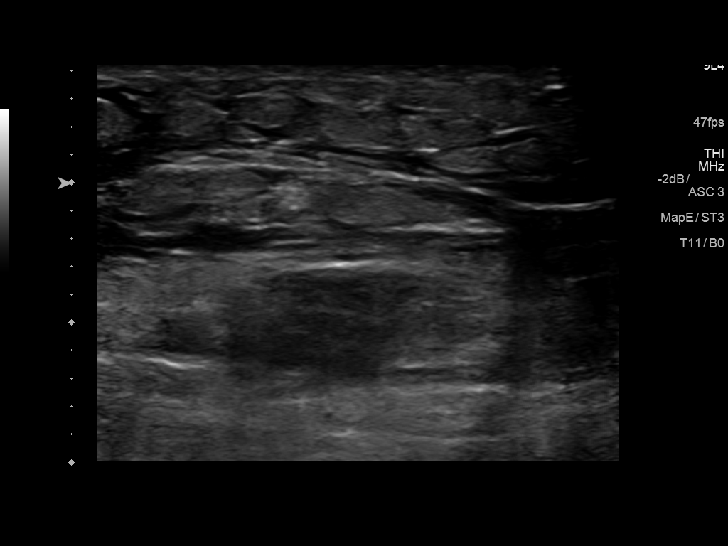

[13 of 24 positions shown; findings below may reference images not displayed]

FINDINGS: Contralateral Subclavian Vein: Respiratory phasicity is normal and
symmetric with the symptomatic side. No evidence of thrombus. Normal
compressibility.

Internal Jugular Vein: No evidence of thrombus. Normal
compressibility, respiratory phasicity and response to augmentation.

Subclavian Vein: No evidence of thrombus. Normal compressibility,
respiratory phasicity and response to augmentation.

Axillary Vein: No evidence of thrombus. Normal compressibility,
respiratory phasicity and response to augmentation.

Cephalic Vein: No evidence of thrombus. Normal compressibility,
respiratory phasicity and response to augmentation.

Basilic Vein: No evidence of thrombus. Normal compressibility,
respiratory phasicity and response to augmentation.

Brachial Veins: No evidence of thrombus. Normal compressibility,
respiratory phasicity and response to augmentation.

Radial Veins: No evidence of thrombus. Normal compressibility,
respiratory phasicity and response to augmentation.

Ulnar Veins: No evidence of thrombus. Normal compressibility,
respiratory phasicity and response to augmentation.

Venous Reflux:  None visualized.

Other Findings: Edema noted in the superficial soft tissues of the
left forearm
IMPRESSION: No evidence of deep venous thrombosis.

## 2017-12-08 ENCOUNTER — Telehealth: Payer: Self-pay | Admitting: Nurse Practitioner

## 2017-12-08 NOTE — Telephone Encounter (Signed)
Spoke with Jerlyn Ly from [employer] [facility] - she will be faxing request for Pt's Xrays. Confirmed Fax # with Jerlyn Ly. - aware that these will be done through [facility] next Thursday.
